# Patient Record
Sex: Female | Born: 1945
Health system: Southern US, Community
[De-identification: ages and names within clinical notes are randomized; demographics above are authoritative.]

## PROBLEM LIST (undated history)

## (undated) DIAGNOSIS — Z9221 Personal history of antineoplastic chemotherapy: Secondary | ICD-10-CM

## (undated) DIAGNOSIS — C801 Malignant (primary) neoplasm, unspecified: Secondary | ICD-10-CM

## (undated) DIAGNOSIS — Z923 Personal history of irradiation: Secondary | ICD-10-CM

## (undated) DIAGNOSIS — D649 Anemia, unspecified: Secondary | ICD-10-CM

## (undated) DIAGNOSIS — T451X5A Adverse effect of antineoplastic and immunosuppressive drugs, initial encounter: Secondary | ICD-10-CM

## (undated) DIAGNOSIS — M8580 Other specified disorders of bone density and structure, unspecified site: Secondary | ICD-10-CM

## (undated) DIAGNOSIS — Z9289 Personal history of other medical treatment: Secondary | ICD-10-CM

## (undated) DIAGNOSIS — Z8619 Personal history of other infectious and parasitic diseases: Secondary | ICD-10-CM

## (undated) DIAGNOSIS — I10 Essential (primary) hypertension: Secondary | ICD-10-CM

## (undated) DIAGNOSIS — G620 Drug-induced polyneuropathy: Secondary | ICD-10-CM

## (undated) DIAGNOSIS — E785 Hyperlipidemia, unspecified: Secondary | ICD-10-CM

## (undated) DIAGNOSIS — T7840XA Allergy, unspecified, initial encounter: Secondary | ICD-10-CM

## (undated) DIAGNOSIS — M1990 Unspecified osteoarthritis, unspecified site: Secondary | ICD-10-CM

## (undated) DIAGNOSIS — Z972 Presence of dental prosthetic device (complete) (partial): Secondary | ICD-10-CM

## (undated) HISTORY — DX: Anemia, unspecified: D64.9

## (undated) HISTORY — DX: Hyperlipidemia, unspecified: E78.5

## (undated) HISTORY — DX: Essential (primary) hypertension: I10

## (undated) HISTORY — PX: COLONOSCOPY: SHX174

## (undated) HISTORY — DX: Other specified disorders of bone density and structure, unspecified site: M85.80

## (undated) HISTORY — DX: Personal history of other infectious and parasitic diseases: Z86.19

## (undated) HISTORY — PX: TUBAL LIGATION: SHX77

## (undated) HISTORY — DX: Allergy, unspecified, initial encounter: T78.40XA

## (undated) HISTORY — DX: Unspecified osteoarthritis, unspecified site: M19.90

---

## 1998-01-23 ENCOUNTER — Ambulatory Visit (HOSPITAL_COMMUNITY): Admission: RE | Admit: 1998-01-23 | Discharge: 1998-01-23 | Payer: Self-pay | Admitting: Gynecology

## 1998-01-25 ENCOUNTER — Ambulatory Visit (HOSPITAL_COMMUNITY): Admission: RE | Admit: 1998-01-25 | Discharge: 1998-01-25 | Payer: Self-pay | Admitting: *Deleted

## 1999-03-05 ENCOUNTER — Ambulatory Visit (HOSPITAL_COMMUNITY): Admission: RE | Admit: 1999-03-05 | Discharge: 1999-03-05 | Payer: Self-pay | Admitting: Gynecology

## 2000-06-22 ENCOUNTER — Encounter: Admission: RE | Admit: 2000-06-22 | Discharge: 2000-06-22 | Payer: Self-pay | Admitting: Gynecology

## 2000-06-22 ENCOUNTER — Encounter: Payer: Self-pay | Admitting: Gynecology

## 2001-02-24 ENCOUNTER — Encounter (INDEPENDENT_AMBULATORY_CARE_PROVIDER_SITE_OTHER): Payer: Self-pay | Admitting: *Deleted

## 2001-02-24 ENCOUNTER — Other Ambulatory Visit: Admission: RE | Admit: 2001-02-24 | Discharge: 2001-02-24 | Payer: Self-pay | Admitting: Gynecology

## 2001-05-06 ENCOUNTER — Inpatient Hospital Stay (HOSPITAL_COMMUNITY): Admission: RE | Admit: 2001-05-06 | Discharge: 2001-05-08 | Payer: Self-pay | Admitting: Gynecology

## 2001-05-06 ENCOUNTER — Encounter (INDEPENDENT_AMBULATORY_CARE_PROVIDER_SITE_OTHER): Payer: Self-pay | Admitting: Specialist

## 2001-05-06 HISTORY — PX: PELVIC FLOOR REPAIR: SHX2192

## 2001-05-06 HISTORY — PX: TOTAL ABDOMINAL HYSTERECTOMY W/ BILATERAL SALPINGOOPHORECTOMY: SHX83

## 2001-06-24 ENCOUNTER — Encounter: Payer: Self-pay | Admitting: Gynecology

## 2001-06-24 ENCOUNTER — Encounter: Admission: RE | Admit: 2001-06-24 | Discharge: 2001-06-24 | Payer: Self-pay | Admitting: Gynecology

## 2002-05-19 ENCOUNTER — Other Ambulatory Visit: Admission: RE | Admit: 2002-05-19 | Discharge: 2002-05-19 | Payer: Self-pay | Admitting: Gynecology

## 2002-06-23 ENCOUNTER — Encounter: Admission: RE | Admit: 2002-06-23 | Discharge: 2002-06-23 | Payer: Self-pay | Admitting: Gynecology

## 2002-06-23 ENCOUNTER — Encounter: Payer: Self-pay | Admitting: Gynecology

## 2002-07-01 ENCOUNTER — Encounter: Admission: RE | Admit: 2002-07-01 | Discharge: 2002-07-01 | Payer: Self-pay | Admitting: Gynecology

## 2002-07-01 ENCOUNTER — Encounter: Payer: Self-pay | Admitting: Gynecology

## 2003-07-03 ENCOUNTER — Other Ambulatory Visit: Admission: RE | Admit: 2003-07-03 | Discharge: 2003-07-03 | Payer: Self-pay | Admitting: Gynecology

## 2003-07-03 ENCOUNTER — Encounter: Admission: RE | Admit: 2003-07-03 | Discharge: 2003-07-03 | Payer: Self-pay | Admitting: Gynecology

## 2003-07-03 ENCOUNTER — Encounter: Payer: Self-pay | Admitting: Gynecology

## 2003-08-31 ENCOUNTER — Observation Stay (HOSPITAL_COMMUNITY): Admission: EM | Admit: 2003-08-31 | Discharge: 2003-09-01 | Payer: Self-pay | Admitting: Emergency Medicine

## 2003-08-31 HISTORY — PX: CARDIAC CATHETERIZATION: SHX172

## 2004-07-03 ENCOUNTER — Other Ambulatory Visit: Admission: RE | Admit: 2004-07-03 | Discharge: 2004-07-03 | Payer: Self-pay | Admitting: Gynecology

## 2004-12-19 ENCOUNTER — Ambulatory Visit (HOSPITAL_COMMUNITY): Admission: RE | Admit: 2004-12-19 | Discharge: 2004-12-19 | Payer: Self-pay | Admitting: Gynecology

## 2005-07-08 ENCOUNTER — Other Ambulatory Visit: Admission: RE | Admit: 2005-07-08 | Discharge: 2005-07-08 | Payer: Self-pay | Admitting: Gynecology

## 2006-04-20 ENCOUNTER — Ambulatory Visit (HOSPITAL_COMMUNITY): Admission: RE | Admit: 2006-04-20 | Discharge: 2006-04-20 | Payer: Self-pay | Admitting: Gynecology

## 2007-01-06 ENCOUNTER — Other Ambulatory Visit: Admission: RE | Admit: 2007-01-06 | Discharge: 2007-01-06 | Payer: Self-pay | Admitting: Gynecology

## 2007-07-19 ENCOUNTER — Ambulatory Visit (HOSPITAL_COMMUNITY): Admission: RE | Admit: 2007-07-19 | Discharge: 2007-07-19 | Payer: Self-pay | Admitting: Gynecology

## 2008-08-15 ENCOUNTER — Ambulatory Visit (HOSPITAL_COMMUNITY): Admission: RE | Admit: 2008-08-15 | Discharge: 2008-08-15 | Payer: Self-pay | Admitting: Gynecology

## 2010-06-21 ENCOUNTER — Ambulatory Visit (HOSPITAL_COMMUNITY): Admission: RE | Admit: 2010-06-21 | Discharge: 2010-06-21 | Payer: Self-pay | Admitting: Gynecology

## 2011-01-31 NOTE — Op Note (Signed)
[facility]  Patient:    Ellen Beltran, Ellen Beltran Visit Number: [id number] MRN: [id number]          Service Type: GYN Location: 4W 0442 01 Attending Physician:  Katrina Stack Dictated by:   Gretta Cool, M.D. Proc. Date: 05/06/01 Adm. Date:  05/06/2001                             Operative Report  PREOPERATIVE DIAGNOSIS:  Atypical endometrial hyperplasia.  POSTOPERATIVE DIAGNOSIS:  Atypical endometrial hyperplasia.  PROCEDURE:  Total abdominal hysterectomy, bilateral salpingo-oophorectomy, peritoneal washings.  SURGEON:  Gretta Cool, M.D.  ASSISTANT:  Raynald Kemp, M.D.  ANESTHESIA:  General.  DESCRIPTION OF PROCEDURE:  Under excellent general anesthesia with the patients abdomen prepped and draped as a sterile field, a Pfannenstiel incision was made and then extended through the fascia.  The peritoneum was then opened and the abdomen explored.  Peritoneal washings were taken prior. The peritoneal surfaces were examined in all areas.  There was no significant endometriosis or evidence of peritoneal surface disease.  The node bearing areas were negative, not enlarged.  At this point [facility] clamps were placed across the adnexal structures and the uterus elevated.  The round ligaments were then transected and the peritoneum opened and a bladder pushed off the lower uterine segment.  The infundibular pelvic vessels were then clamped, cut, sutured and tied with 0 Vicryl.  The uterine vessels were then skeletonized, clamped, cut, sutured and tied with 0 Vicryl. The cardinal and uterosacral ligaments were then progressively clamped, cut, sutured and tied with 0 Vicryl.  The cervix was then excised from the vaginal cuff.  The cuff was closed with a running suture of 0 Vicryl.  At this point a uterosacral cardinal colposuspension was undertaken, so as to secure the vaginal cuff and to prevent weakness and possible subsequent vault prolapse.   The anterior vaginal wall was plicated in the area that it was thin at the junction with the cervix.  At this point the pelvic floor was well supported.  The pelvic peritoneum was then closed with running suture of 2-0 Monocryl.  The pelvis was then irrigated with lactated Ringers and the instruments and packs were removed.  The omentum was pulled down over the bowel.  The abdominal peritoneum was then closed with running suture #0 Monocryl.  The fascia was approximated with 0 Vicryl from each angle of the midline.  Subcutaneous tissue was approximated with interrupted suture with 3-0 Vicryl and the skin closed with skin staples and Steri-Strips.  At the end of the procedure, sponge and lap counts were correct.  There were no complications.  The patient returned to the recovery room in excellent condition. Dictated by:   Gretta Cool, M.D. Attending Physician:  Katrina Stack DD:  05/06/01 TD:  05/06/01 Job: [id number]

## 2011-01-31 NOTE — Consult Note (Signed)
NAME:  Ellen Beltran, Ellen Beltran                         ACCOUNT NO.:  [id number]   MEDICAL RECORD NO.:  [id number]                   PATIENT TYPE:  INP   LOCATION:  1827                                 FACILITY:  [facility]   PHYSICIAN:  Cristy Hilts. Jacinto Halim, M.D.                  DATE OF BIRTH:  1945/11/13   DATE OF CONSULTATION:  DATE OF DISCHARGE:                                   CONSULTATION   CHIEF COMPLAINT:  Chest pain since 10 a.m.   HISTORY OF PRESENT ILLNESS:  [name] is a 65 year old Caucasian  female with prior history of hyperlipidemia and a very strong family history  of premature coronary artery disease.  She was doing well until about 10  o'clock this morning when she had severe mid sternal chest pain with  radiation to her back and also to her jaws.  During this she continued to  work and went to her work place, where she had severe exacerbation of her  chest pain associated with lightheadedness.  She went to see Dr. Evelena Peat and she had a mildly abnormal EKG.  With the ongoing chest pain  she was sent to our office for further evaluation.  In the office she  continues to have severe chest pain with radiation to her jaw.  She was  given two sublingual nitroglycerins without any relief of the chest pain.   She denies any shortness of breath, denies any associated palpitations,  diaphoresis, nausea and vomiting.   REVIEW OF SYSTEMS:  She denies any paroxysmal dyspnea or orthopnea.  She  denies any shortness of breath.  She denies any neurological weaknesses.  She denies any recent weight gain or weight loss, denies any symptoms of  reflux.  Other systems are negative.   PAST MEDICAL HISTORY:  Hyperlipidemia.   PAST SURGICAL HISTORY:  Hysterectomy about 2-1/2 years ago.   FAMILY HISTORY:  Strongly positive for premature coronary artery disease.  Father died at the age of 31 with a massive myocardial infarction.  Both of  her brothers had coronary artery bypass  grafting in their 38s.   SOCIAL HISTORY:  She is married and lives with her husband.  She does not  smoke, she does not drink alcohol.  She is fairly active.  She watches her  diet.   PRESENT MEDICATIONS:  1. Aspirin 81 mg p.o. daily.  2. __________ 1 p.o. every other day.  3. Vivelle patch twice per week.   ALLERGIES:  No known drug allergies.   PHYSICAL EXAMINATION:  GENERAL:  She is petite.  She appears to be in  distress with chest pain.  VITAL SIGNS:  Heart rate of 118 beats/minute, respirations were 17, blood  pressure was 172/80 mmHg on right and 172/80 mmHg on the left arm.  HEART:  Tachycardia present.  No gallop appreciated.  No murmurs  appreciated.  No rub, no evidence  of aortic regurgitation on physical  examination.  CHEST:  There are clear breath sounds.  No crackles.  ABDOMEN:  Benign.  No organomegaly.  EXTREMITIES:  Showed no evidence of edema.  Peripheral vascular examination  was normal with all pulses to be equal and 3+.   PERTINENT FINDINGS:  ECG demonstrates small Q waves in the inferolateral  leads.  No acute changes of ischemia.   IMPRESSION:  1. Chest pain with radiation to her back and her jaws suggestive of unstable     angina.  The chest pain is ongoing and unresponsive to nitroglycerin.  2. Hyperlipidemia with the low-density lipoprotein in the 180s a year ago.  3. Strong family history for coronary artery disease as dictated above.   RECOMMENDATIONS:  The patient will be admitted to the hospital on an  emergent basis via EMS.  She will be taken to the cardiac catheterization  lab on an emergent basis.  The risks of cardiac catheterization, including  1% risk of death, stroke, heart attack, or need for urgent bypass surgery  with diagnostic catheterization and 2-3% of PCI has been explained to the  patient.  The patient and her husband, who was present at the bedside, are  willing to proceed.  Further recommendations will follow following the   cardiac catheterization.                                               Cristy Hilts. Jacinto Halim, M.D.    Pilar Plate  D:  08/31/2003  T:  08/31/2003  Job:  [id number]   cc:   Althea Charon  [address]  [place]  [id number]  Fax: [phone]

## 2011-01-31 NOTE — Cardiovascular Report (Signed)
NAME:  Ellen Beltran, Ellen Beltran                         ACCOUNT NO.:  [id number]   MEDICAL RECORD NO.:  [id number]                   PATIENT TYPE:  INP   LOCATION:  6522                                 FACILITY:  [facility]   PHYSICIAN:  Cristy Hilts. Jacinto Halim, M.D.                  DATE OF BIRTH:  [birth year]   DATE OF PROCEDURE:  DATE OF DISCHARGE:                              CARDIAC CATHETERIZATION   REFERRING PHYSICIAN:  Evelena Peat, M.D.   PROCEDURE PERFORMED:  1. Left ventriculography.  2. Selective left and right coronary arteriography.  3. Ascending aortogram.  4. Right femoral angiography with closure of the right coronary artery     access with AngioSeal.   INDICATIONS:  Ellen Beltran is a 65 year old Caucasian female with past  medical history of hyperlipidemia, a very strong family history of premature  coronary artery disease in the family which father had myocardial infarction  and died with massive MI at the age of 68, two of her brothers having had  bypass surgery in their 3s.  Was sent to our office directly from Dr.  Lucie Leather office after she had complained of severe chest pain with  radiation to her jaw.  She had abnormal EKG in the form of small Q-waves  noted in inferolateral leads.  Giving ongoing chest pain she was emergently  brought to the cardiac catheterization laboratory to evaluate her coronary  anatomy.   Ascending aortogram was performed to evaluate for aortic dissection.   HEMODYNAMIC DATA:  Left ventricular pressures were 129/7 with end-diastolic  pressure of 8 mmHg.  The aortic pressures were 106/51 with a mean of 76  mmHg.  There was no pressure gradient across the aortic valve.   LEFT VENTRICULOGRAPHY DATA:  Left ventricle:  The left ventricular systolic  function was supranormal and hyperdynamic left ventricle with ejection  fraction estimated at 70-80%.  There was no mitral regurgitation.   CORONARY ANGIOGRAPHY:  1. Right coronary artery:  The right  coronary artery is a nondominant     vessel.  It is normal.  2. Left main coronary artery:  The left main coronary artery is a large     caliber vessel.  It is normal.  3. Circumflex:  The circumflex artery is a large caliber vessel and a     dominant vessel.  It gives origin to a large obtuse marginal 1 and a     large obtuse marginal 2 and continues as obtuse marginal 3 after giving     origin to PDA branches.  It is a dominant vessel.  4. Left anterior descending artery:  The left anterior descending artery is     a large caliber vessel.  It is smooth with no luminal irregularity.  It     gives origin to moderate sized diagonal 1, small diagonal 2, a moderate     sized diagonal 3, and several small diagonals.  It wraps around the apex.  5. Left main coronary artery:  The left main coronary artery is a large     caliber vessel and is normal.   ASCENDING AORTOGRAM:  Ascending aortogram revealed presence of three aortic  valve cusps.  There is mild aortic regurgitation.  There was no evidence of  aortic dissection.   IMPRESSION:  1. Supranormal left ventricular systolic function.  Ejection fraction     estimated at 70-80%.  2. Normal coronary artery left dominant circulation.  3. Mild aortic regurgitation.  No evidence of aortic dissection.  No     evidence of aortic root dilatation.   RECOMMENDATIONS:  1. Based on her coronary anatomy, evaluation of noncardiac cause of chest     pain is indicated.  The patient will have a VQ scan done today given her     significant chest discomfort for evaluation of pulmonary embolism.  The     patient will be started on IV heparin two hours after her access has been     closed with the AngioSeal.  2. Aggressive risk factor modification given her significant family history     is indicated including treatment of her hyperlipidemia.  Further     recommendations will follow.   TECHNIQUE OF PROCEDURE:  Under usual sterile precautions using a  6-French  right femoral arterial access sheath, a 6-French multipurpose B2 catheter  was advanced to the ascending aorta with 0.035 [place] J-wire.  The catheter  was then advanced to the left ventricle.  Left ventricular pressures were  monitored.  Hand contrast into the left ventricle was performed both in LAO  and RAO projection.  The catheter was flushed with saline and pulled back  into the ascending aorta and pressure gradient across the aorta was  monitored.  Right coronary artery was selectively engaged.  Angiography was  performed.  In a similar fashion left main coronary was selectively engaged  and angiography was performed.  Then the catheter was pulled back into the  root of the aorta and ascending aortogram was performed.  Because of  inadequate visualization the catheter was exchanged for a 6-French pigtail  catheter and 40 mL of contrast at 20 mL/second was administered and thoracic  aortogram was performed in the LAO projection.  Then the catheter was pulled  out of the body in the usual fashion.  Right femoral angiography was  performed through the arterial access sheath and the access was closed with  AngioSeal with excellent hemostasis obtained and patient was then  transferred to recovery in a stable condition.  The patient tolerated  procedure well.  No complications were noted.                                               Cristy Hilts. Jacinto Halim, M.D.    Pilar Plate  D:  08/31/2003  T:  09/01/2003  Job:  [id number]   cc:   Evelena Peat, M.D.  [address]  [place]  [place] [address]  Fax: [phone]   [facility]

## 2011-01-31 NOTE — H&P (Signed)
[facility]  Patient:    Ellen Beltran, OSBURN Visit Number: [id number] MRN: [id number]          Service Type: GYN Location: 4W 0442 01 Attending Physician:  Katrina Stack Dictated by:   Gretta Cool, M.D. Proc. Date: 05/06/01 Adm. Date:  05/06/2001   CC:         Teena Irani. Arlyce Dice, M.D.   History and Physical  HISTORY OF PRESENT ILLNESS:  Ellen Beltran is a 65 year old gravida 2, para 2, under the primary care of Dr. Dara Hoyer, [place], [place].  She is on hormone replacement therapy with Premarin and Provera.  In April 2002, she experienced bleeding for the entire month, and moderate pelvic discomfort. She states she has taken the Provera on a regular basis, 14 days each cycle, but on endometrial sampling was found to have atypical endometrial hyperplasia with no evidence of adenocarcinoma.  Because of the nature of this process, she is now admitted for definitive therapy by hysterectomy and salpingo-oophorectomy.  She understands there is a possibility of finding more than atypical hyperplasia.  She is now admitted for definitive therapy as above.  She understands that there is no alternative that is satisfactory for complete relief of concern about endometrial adenocarcinoma developing without hysterectomy.  She has no pelvic support problems by history.  Has slight anterior vaginal wall weakness.  We have planned a hysterectomy, salpingo-oophorectomy, with vaginal vault suspension, uterosacral cardinal complex suspension.  PAST MEDICAL HISTORY:  Usual childhood diseases without sequelae.  MEDICAL ILLNESSES:  None.  PAST SURGICAL HISTORY:  Tubal sterilization in 1976.  HABITS:  Denies ethanol, tobacco, or recreational drug use.  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  Her children are grown and away from home.  FAMILY HISTORY:  Father died of myocardial infarction.   Mother is living at advanced age with hypothyroidism and blood pressure elevation.  Two sisters also have thyroid abnormality and blood pressure elevation.  One brother is living after myocardial infarction.  REVIEW OF SYSTEMS:  HEENT:  Denies symptoms.  CARDIORESPIRATORY:  Denies asthma, cough, bronchitis, shortness of breath.  GASTROINTESTINAL/GASTROURINARY:  Denies frequency, urgency, dysuria, change in bowel habits, food intolerance.  PHYSICAL EXAMINATION:  GENERAL:  Well-developed, well-nourished, thin white female.  HEENT:  Pupils are equal, round and reactive to light and accommodation. Fundi not examined.  Oropharynx is clear.  NECK:  Supple without mass or thyroid enlargement.  CHEST:  Clear to auscultation and percussion.  HEART:  Regular rhythm without murmur or cardiac enlargement.  BREASTS:  Soft without mass, nodes, nipple discharge.  PELVIC:  External genitalia normal female vagina, clean rugous, slight anterior vault and vaginal wall central weakness.  The cervix is parous and clean.  Uterus is normal size, shape, and contour.  Adnexa clear. Rectovaginal confirms.  IMPRESSIONS: 1. Atypical endometrial hyperplasia, complex, premalignant lesion. 2. Mild anterior fascial weakness. 3. Family history of thyroid autoimmune disease. 4. Strong family history of cardiovascular disease. 5. Family history of colon polyps.  Primary health care by Dr. Dara Hoyer, [facility]. Dictated by:   Gretta Cool, M.D. Attending Physician:  Katrina Stack DD:  05/06/01 TD:  05/06/01 Job: [id number]

## 2011-01-31 NOTE — Discharge Summary (Signed)
NAME:  Ellen Beltran, Ellen Beltran                         ACCOUNT NO.:  [id number]   MEDICAL RECORD NO.:  [id number]                   PATIENT TYPE:  INP   LOCATION:  6522                                 FACILITY:  [facility]   PHYSICIAN:  Cristy Hilts. Jacinto Halim, M.D.                  DATE OF BIRTH:  25-Feb-1946   DATE OF ADMISSION:  08/31/2003  DATE OF DISCHARGE:  09/01/2003                                 DISCHARGE SUMMARY   DISCHARGE DIAGNOSES:  1. Chest pain, resolved.  2. Status post catheterization yesterday with clear coronaries.  3. Status post VQ scan with negative result.  4. History of hyperlipidemia.  Lipid profile is pending at the time of     discharge.  5. Premature family history of coronary artery disease.  6. Possible gastroesophageal reflux disease on empiric proton pump inhibitor     therapy.  7. History of hysterectomy.   HISTORY OF PRESENT ILLNESS/HOSPITAL COURSE:  This is a 65 year old female  with a prior history of hyperlipidemia diagnosed approximately a year ago  when her LDL was in the 180s and has not been treated with Nystatin therapy  prior. The patient presented to Dr. Mellody Life office with chest pain.  In  the early morning, she experienced chest pain with radiation to her jaw,  still proceeded and went to work. While at work, she felt worse and the pain  started radiating to the back and again to the jaw line. She was feeling  dizzy. She was seen by Dr. Jacinto Halim in his office, and because of the  progressive nature of chest pain, she was sent to the emergency department  for evaluation. The patient has been assessed by Dr. Jacinto Halim and decision was  made to proceed with cardiac catheterization for definitive diagnosis.   HOSPITAL PROCEDURES:  On August 31, 2003, Dr. Jacinto Halim performed cardiac  catheterization that showed normal coronaries and a left dominant coronary  circulation with an EF of 70% to 80%, and normal mitral regurgitation. The  aorta did not show any dissection. No  dilatation of aortic root.   A VQ scan was performed and showed negative result for pulmonary embolism.   All her cardiac panels were negative x3.   Labs showed the following results: White blood cell count was elevated to  20.8, hemoglobin 13.3, hematocrit 38.7, platelet count 249,000.  Sodium 140,  potassium 4.0, chloride 101, CO2 29, BUN 14, creatinine 0.7, glucose 158.   The patient was assessed by Dr. Alanda Amass the morning of discharge and found  to be stable from a cardiovascular standpoint. Her chest pain resolved  except for some minor discomfort in the upper epigastrium. A decision was  made to discharge the patient on PPI and hold statin therapy up until the  fasting lipid profile becomes available, and based on the result of her  cholesterol status, we will make a choice of dietary modifications versus  pharmacological therapy.   DISCHARGE ACTIVITY:  No driving, no heavy lifting, or strenuous activity for  three days post catheterization.   DISCHARGE DIET:  Low-fat, low-cholesterol diet.   She is instructed to call our office should there be any problems with the  groin puncture site. Follow the appointment scheduled in our office on  September 14, 2003, at 10:45 a.m. with Dr. Jacinto Halim.      Raymon Mutton, P.A.                    Cristy Hilts. Jacinto Halim, M.D.    MK/MEDQ  D:  09/01/2003  T:  09/02/2003  Job:  [id number]   cc:   [facility]

## 2011-01-31 NOTE — Op Note (Signed)
[facility]  Patient:    Ellen Beltran, Ellen Beltran Visit Number: [id number] MRN: [id number]          Service Type: GYN Location: 4W 0442 01 Attending Physician:  Katrina Stack Dictated by:   Gretta Cool, M.D. Proc. Date: 05/06/01 Adm. Date:  05/06/2001 Disc. Date: 05/08/2001   CC:         Teena Irani. Arlyce Dice, M.D.   Operative Report  PREOPERATIVE DIAGNOSIS:  Atypical endometrial complex hyperplasia.  POSTOPERATIVE DIAGNOSIS:  Atypical endometrial complex hyperplasia.  No evidence of invasion or of adenocarcinoma.  PROCEDURES:  Total abdominal hysterectomy, bilateral salpingo-oophorectomy, cardinal uterosacral colposuspension.  SURGEON:  Gretta Cool, M.D.  ASSISTANT:  Raynald Kemp, M.D.  ANESTHESIA:  General.  DESCRIPTION OF PROCEDURE:  Under excellent general anesthesia, the patient was examined and prepped and draped as a sterile field.  A Foley catheter catheter draining her bladder.  A Pfannenstiel incision was made and the incision was extended through the fascia and the rectus muscle was separated in the midline.  The peritoneum was opened.  The abdomen was explored and there were no abnormalities identified in the upper abdomen.  The appendix was not removed.  Examination of the pelvis previous tubal sterilization procedure, but otherwise normal appearing uterus and very small ovaries.  The adnexal structures were clamped across as to elevate the uterus.  The round ligament was then transected.  The anterior leaf of the broad ligament was then opened and bladder pushed off the lower segment.  The infundibular pelvic vessels were then skeletonized, clamped, cut, sutured, and tied with 0 Vicryl.  A second free tie was used to doubly ligate the infundibular pelvic.  Uterine vessels were then skeletonized, clamp, cut, sutured, and tied with 0 Vicryl. The cardinal and uterosacral ligaments were then also clamped, cut, sutured, and  tied with 0 Vicryl.  The cervix was then excised.  The vaginal cuff was closed with a running suture of 0 Vicryl.  The bladder was advanced further and weak area of anterior fascia plicated centrally with 2-0 Vicryl.  At this point a cardinal uterosacral colposuspension was performed using 0 Ethibond so as to secure the uterosacral cardinal and ligaments to the apex of the vaginal cuff.  At this point the pelvic closure was repaired with a running suture of 2-0 Monocryl.  The retractors were then removed and the pelvis irrigated with lactated ringers.  The omentum was pulled down over the bowel.  The abdominal peritoneum was then closed with a running suture of 0 Monocryl.  The rectus muscles were plicated in the midline.  The fascia was then approximated with running suture of 0 Vicryl from each angle.  The midline and subcutaneous tissues were approximated with interrupted sutures of 3-0 Vicryl, and the skin was closed with skin staples and Steri-Strips.  At the end of the procedure sponge and lap counts were correct.  There were no complications.  The patient was returned to recovery room in excellent condition. Dictated by:   Gretta Cool, M.D. Attending Physician:  Katrina Stack DD:  05/08/01 TD:  05/10/01 Job: [id number]

## 2011-01-31 NOTE — Discharge Summary (Signed)
[facility]  Patient:    Ellen Beltran, Ellen Beltran Visit Number: [id number] MRN: [id number]          Service Type: Attending:  Gretta Cool, M.D. Dictated by:   Ellen Beltran, F.N.P. Adm. Date:  05/06/01 Disc. Date: 05/08/01   CC:         Ellen Beltran. Ellen Beltran, M.D.   Discharge Summary  HISTORY OF PRESENT ILLNESS:  Ellen Beltran is a 65 year old, G2, P2 female who presented to our office with complaints of vaginal bleeding x 1 month and moderate pelvic discomfort.  She states she has been taking Provera on a regular basis 14 days each cycle.  Endometrial sampling was done and found to have atypical endometrial hyperplasia without evidence of adenocarcinoma.  She is now admitted for definitive therapy by hysterectomy and salpingo-oophorectomy under general anesthesia.  It is noted that she reports no pelvic support problems by history.  PHYSICAL EXAMINATION:  CHEST:  Clear to A&P.  HEART:  Regular rate and rhythm without murmur, gallop or cardiac enlargement.  ABDOMEN:  Soft and scaphoid without masses or organomegaly.  PELVIC:  External genitalia within normal limits of female.  Vagina clean and rugae.  Slight anterior vault and vaginal wall central weakness.  The cervix is parous and clean.  Uterus is normal size, shape and contour.  Adnexa bilaterally clear.  Rectovaginal exam confirms.  IMPRESSION: 1. Atypical endometrial hyperplasia, complex, premalignant lesion. 2. Mild anterior fascial weakness. 3. Family history of thyroid autoimmune disease. 4. Strong family history of cardiovascular disease. 5. History of colon polyps.  PLAN:  Total abdominal hysterectomy with bilateral salpingo-oophorectomy under general anesthesia.  The risks and benefits have been discussed with the patient and she accepts these procedures.  LABORATORY DATA AND X-RAY FINDINGS:  Admission hemoglobin 12.1, hematocrit 35.7.  On postop day #1, hemoglobin was 11.8 with hematocrit  33.7.  EKG with normal sinus rhythm with a sinus arrhythmia.  HOSPITAL COURSE:  The patient underwent total abdominal hysterectomy, bilateral salpingo-oophorectomy and cardinal-uterosacral colposcopy suspension under general anesthesia.  The procedures were completed without any complications and the patient was returned to the recovery room in excellent condition.  Pathology report with uterine cervix with benign ectocervical and endocervical tissue, uterine corpus with proliferative endometrial with focal complex hyperplasia and associated mild atypia, nine intramural leiomyomatas, bilateral benign ovaries and a benign left vaginal cyst.  Her postoperative course was without complications and she was discharged on postop day #2 in excellent condition.  ACTIVITY:  No heavy lifting or straining.  No vaginal entrance.  Increase ambulation as tolerated.  SPECIAL INSTRUCTIONS:  She is to call for any fever of over 100.5 or failure in daily improvement.  DIET:  Regular.  DISCHARGE MEDICATIONS: 1. Tylox one p.o. q.2h. p.r.n. pain. 2. Vioxx 25 mg daily.  FOLLOWUP:  She is to return to the office in one week for followup.  CONDITION ON DISCHARGE:  Excellent.  DISCHARGE DIAGNOSIS:  Atypical endometrial hyperplasia.  PROCEDURES:  Total abdominal hysterectomy with bilateral salpingo-oophorectomy and peritoneal washings under general anesthesia. Dictated by:   Ellen Beltran, F.N.P. Attending:  Gretta Cool, M.D. DD:  05/31/01 TD:  05/31/01 Job: [id number]

## 2011-06-19 ENCOUNTER — Other Ambulatory Visit (HOSPITAL_COMMUNITY): Payer: Self-pay | Admitting: Family Medicine

## 2011-06-19 DIAGNOSIS — Z1231 Encounter for screening mammogram for malignant neoplasm of breast: Secondary | ICD-10-CM

## 2011-07-02 ENCOUNTER — Ambulatory Visit (HOSPITAL_COMMUNITY): Payer: Self-pay

## 2011-07-16 ENCOUNTER — Ambulatory Visit (HOSPITAL_COMMUNITY)
Admission: RE | Admit: 2011-07-16 | Discharge: 2011-07-16 | Disposition: A | Discharge status: Home / Self Care | Payer: Private Health Insurance - Indemnity | Source: Ambulatory Visit | Attending: Family Medicine | Admitting: Family Medicine

## 2011-07-16 DIAGNOSIS — Z1231 Encounter for screening mammogram for malignant neoplasm of breast: Secondary | ICD-10-CM | POA: Insufficient documentation

## 2012-05-03 ENCOUNTER — Encounter: Payer: Self-pay | Admitting: Internal Medicine

## 2012-06-08 ENCOUNTER — Encounter: Payer: Self-pay | Admitting: Internal Medicine

## 2012-06-08 ENCOUNTER — Ambulatory Visit (AMBULATORY_SURGERY_CENTER): Payer: Private Health Insurance - Indemnity

## 2012-06-08 VITALS — Ht <= 58 in | Wt 105.7 lb

## 2012-06-08 DIAGNOSIS — Z8371 Family history of colonic polyps: Secondary | ICD-10-CM

## 2012-06-08 DIAGNOSIS — Z1211 Encounter for screening for malignant neoplasm of colon: Secondary | ICD-10-CM

## 2012-06-08 MED ORDER — MOVIPREP 100 G PO SOLR: ORAL | Status: DC

## 2012-06-22 ENCOUNTER — Encounter: Payer: Self-pay | Admitting: Internal Medicine

## 2012-06-22 ENCOUNTER — Ambulatory Visit (AMBULATORY_SURGERY_CENTER): Payer: Private Health Insurance - Indemnity | Admitting: Internal Medicine

## 2012-06-22 VITALS — BP 105/56 | HR 55 | Temp 96.2°F | Resp 16 | Ht <= 58 in | Wt 105.0 lb

## 2012-06-22 DIAGNOSIS — Z8371 Family history of colonic polyps: Secondary | ICD-10-CM

## 2012-06-22 DIAGNOSIS — Z1211 Encounter for screening for malignant neoplasm of colon: Secondary | ICD-10-CM

## 2012-06-22 HISTORY — PX: COLONOSCOPY WITH PROPOFOL: SHX5780

## 2012-06-22 MED ORDER — SODIUM CHLORIDE 0.9 % IV SOLN: 500.0000 mL | INTRAVENOUS | Status: DC

## 2012-06-22 NOTE — Op Note (Signed)
[facility] [address] [place], [address]   COLONOSCOPY PROCEDURE REPORT  PATIENT: Ellen, Beltran  MR#: [id number] BIRTHDATE: December 10, 1945 , 65  yrs. old GENDER: Female ENDOSCOPIST: Roxy Cedar, MD REFERRED XB:[id number] / Recall PROCEDURE DATE:  06/22/2012 PROCEDURE:   Colonoscopy, screening ASA CLASS:   Class II INDICATIONS:average risk screening; Normal colonoscopy 09-2001. MEDICATIONS: MAC sedation, administered by CRNA and propofol (Diprivan) 300mg  IV  DESCRIPTION OF PROCEDURE:   After the risks benefits and alternatives of the procedure were thoroughly explained, informed consent was obtained.  A digital rectal exam revealed no abnormalities of the rectum.   The LB CF-[id number]  endoscope was introduced through the anus and advanced to the cecum, which was identified by both the appendix and ileocecal valve. No adverse events experienced.   The quality of the prep was excellent, using MoviPrep  The instrument was then slowly withdrawn as the colon was fully examined.      COLON FINDINGS: A normal appearing cecum, ileocecal valve, and appendiceal orifice were identified.  The ascending, hepatic flexure, transverse, splenic flexure, descending, sigmoid colon and rectum appeared unremarkable.  No polyps or cancers were seen. Retroflexed views revealed no abnormalities. The time to cecum=3 minutes 33 seconds.  Withdrawal time=9 minutes 52 seconds.  The scope was withdrawn and the procedure completed. COMPLICATIONS: There were no complications.  ENDOSCOPIC IMPRESSION: 1. Normal colon  RECOMMENDATIONS: 1. Continue current colorectal screening recommendations for "routine risk" patients with a repeat colonoscopy in 10 years.   eSigned:  Roxy Cedar, MD 06/22/2012 10:33 AM   cc: Benedetto Goad, MD and The Patient

## 2012-06-22 NOTE — Patient Instructions (Addendum)
YOU HAD AN ENDOSCOPIC PROCEDURE TODAY AT THE [facility]: Refer to the procedure report that was given to you for any specific questions about what was found during the examination.  If the procedure report does not answer your questions, please call your gastroenterologist to clarify.  If you requested that your care partner not be given the details of your procedure findings, then the procedure report has been included in a sealed envelope for you to review at your convenience later.  YOU SHOULD EXPECT: Some feelings of bloating in the abdomen. Passage of more gas than usual.  Walking can help get rid of the air that was put into your GI tract during the procedure and reduce the bloating. If you had a lower endoscopy (such as a colonoscopy or flexible sigmoidoscopy) you may notice spotting of blood in your stool or on the toilet paper. If you underwent a bowel prep for your procedure, then you may not have a normal bowel movement for a few days.  DIET: Your first meal following the procedure should be a light meal and then it is ok to progress to your normal diet.  A half-sandwich or bowl of soup is an example of a good first meal.  Heavy or fried foods are harder to digest and may make you feel nauseous or bloated.  Likewise meals heavy in dairy and vegetables can cause extra gas to form and this can also increase the bloating.  Drink plenty of fluids but you should avoid alcoholic beverages for 24 hours.  ACTIVITY: Your care partner should take you home directly after the procedure.  You should plan to take it easy, moving slowly for the rest of the day.  You can resume normal activity the day after the procedure however you should NOT DRIVE or use heavy machinery for 24 hours (because of the sedation medicines used during the test).    SYMPTOMS TO REPORT IMMEDIATELY: A gastroenterologist can be reached at any hour.  During normal business hours, 8:30 AM to 5:00 PM Monday through Friday,  call [phone].  After hours and on weekends, please call the GI answering service at [phone] who will take a message and have the physician on call contact you.   Following lower endoscopy (colonoscopy or flexible sigmoidoscopy):  Excessive amounts of blood in the stool  Significant tenderness or worsening of abdominal pains  Swelling of the abdomen that is new, acute  Fever of 100F or higher  FOLLOW UP: If any biopsies were taken you will be contacted by phone or by letter within the next 1-3 weeks.  Call your gastroenterologist if you have not heard about the biopsies in 3 weeks.  Our staff will call the home number listed on your records the next business day following your procedure to check on you and address any questions or concerns that you may have at that time regarding the information given to you following your procedure. This is a courtesy call and so if there is no answer at the home number and we have not heard from you through the emergency physician on call, we will assume that you have returned to your regular daily activities without incident.  SIGNATURES/CONFIDENTIALITY: You and/or your care partner have signed paperwork which will be entered into your electronic medical record.  These signatures attest to the fact that that the information above on your After Visit Summary has been reviewed and is understood.  Full responsibility of the confidentiality of this   discharge information lies with you and/or your care-partner.   Thank-you for choosing us for your healthcare needs. 

## 2012-06-22 NOTE — Progress Notes (Addendum)
Patient did not have preoperative order for IV antibiotic SSI prophylaxis. (G8918)  Patient did not experience any of the following events: a burn prior to discharge; a fall within the facility; wrong site/side/patient/procedure/implant event; or a hospital transfer or hospital admission upon discharge from the facility. (G8907)  

## 2012-06-23 ENCOUNTER — Telehealth: Payer: Self-pay

## 2012-06-23 NOTE — Telephone Encounter (Signed)
Left message on answering machine. 

## 2013-03-21 ENCOUNTER — Other Ambulatory Visit: Payer: Self-pay | Admitting: Obstetrics & Gynecology

## 2013-03-21 DIAGNOSIS — R928 Other abnormal and inconclusive findings on diagnostic imaging of breast: Secondary | ICD-10-CM

## 2013-03-30 ENCOUNTER — Ambulatory Visit
Admission: RE | Admit: 2013-03-30 | Discharge: 2013-03-30 | Disposition: A | Discharge status: Home / Self Care | Payer: Medicare Other | Source: Ambulatory Visit | Attending: Obstetrics & Gynecology | Admitting: Obstetrics & Gynecology

## 2013-03-30 ENCOUNTER — Other Ambulatory Visit: Payer: Self-pay | Admitting: Obstetrics & Gynecology

## 2013-03-30 DIAGNOSIS — R928 Other abnormal and inconclusive findings on diagnostic imaging of breast: Secondary | ICD-10-CM

## 2013-04-15 ENCOUNTER — Other Ambulatory Visit: Payer: Self-pay | Admitting: Obstetrics & Gynecology

## 2013-04-15 ENCOUNTER — Ambulatory Visit
Admission: RE | Admit: 2013-04-15 | Discharge: 2013-04-15 | Disposition: A | Discharge status: Home / Self Care | Payer: Medicare Other | Source: Ambulatory Visit | Attending: Obstetrics & Gynecology | Admitting: Obstetrics & Gynecology

## 2013-04-15 DIAGNOSIS — R928 Other abnormal and inconclusive findings on diagnostic imaging of breast: Secondary | ICD-10-CM

## 2013-04-18 ENCOUNTER — Other Ambulatory Visit: Payer: Self-pay | Admitting: Obstetrics & Gynecology

## 2013-04-18 DIAGNOSIS — C50912 Malignant neoplasm of unspecified site of left female breast: Secondary | ICD-10-CM

## 2013-04-20 ENCOUNTER — Telehealth: Payer: Self-pay | Admitting: *Deleted

## 2013-04-20 DIAGNOSIS — Z853 Personal history of malignant neoplasm of breast: Secondary | ICD-10-CM | POA: Insufficient documentation

## 2013-04-20 DIAGNOSIS — C50112 Malignant neoplasm of central portion of left female breast: Secondary | ICD-10-CM

## 2013-04-20 NOTE — Telephone Encounter (Signed)
Received call from Merrick, patient's husband and confirmed [facility] appt. For 04/27/13 at 0800am.  Instructions and contact information given.

## 2013-04-21 ENCOUNTER — Telehealth: Payer: Self-pay

## 2013-04-22 ENCOUNTER — Other Ambulatory Visit: Payer: Medicare Other

## 2013-04-25 ENCOUNTER — Ambulatory Visit
Admission: RE | Admit: 2013-04-25 | Discharge: 2013-04-25 | Disposition: A | Discharge status: Home / Self Care | Payer: Medicare Other | Source: Ambulatory Visit | Attending: Obstetrics & Gynecology | Admitting: Obstetrics & Gynecology

## 2013-04-25 DIAGNOSIS — C50912 Malignant neoplasm of unspecified site of left female breast: Secondary | ICD-10-CM

## 2013-04-25 MED ORDER — GADOBENATE DIMEGLUMINE 529 MG/ML IV SOLN
9.0000 mL | Freq: Once | INTRAVENOUS | Status: AC | PRN
  Administered 2013-04-25: 9 mL via INTRAVENOUS

## 2013-04-27 ENCOUNTER — Encounter: Payer: Self-pay | Admitting: Oncology

## 2013-04-27 ENCOUNTER — Ambulatory Visit (HOSPITAL_BASED_OUTPATIENT_CLINIC_OR_DEPARTMENT_OTHER): Payer: Medicare Other | Admitting: Surgery

## 2013-04-27 ENCOUNTER — Encounter: Payer: Self-pay | Admitting: *Deleted

## 2013-04-27 ENCOUNTER — Other Ambulatory Visit (HOSPITAL_BASED_OUTPATIENT_CLINIC_OR_DEPARTMENT_OTHER): Payer: Medicare Other | Admitting: Lab

## 2013-04-27 ENCOUNTER — Ambulatory Visit (HOSPITAL_BASED_OUTPATIENT_CLINIC_OR_DEPARTMENT_OTHER): Payer: Medicare Other | Admitting: Oncology

## 2013-04-27 ENCOUNTER — Ambulatory Visit: Payer: Medicare Other

## 2013-04-27 ENCOUNTER — Ambulatory Visit
Admission: RE | Admit: 2013-04-27 | Discharge: 2013-04-27 | Disposition: A | Discharge status: Home / Self Care | Payer: Medicare Other | Source: Ambulatory Visit | Attending: Radiation Oncology | Admitting: Radiation Oncology

## 2013-04-27 VITALS — BP 150/74 | HR 60 | Temp 97.6°F | Resp 18 | Ht <= 58 in | Wt 109.2 lb

## 2013-04-27 DIAGNOSIS — C50119 Malignant neoplasm of central portion of unspecified female breast: Secondary | ICD-10-CM

## 2013-04-27 DIAGNOSIS — C50912 Malignant neoplasm of unspecified site of left female breast: Secondary | ICD-10-CM

## 2013-04-27 DIAGNOSIS — C50919 Malignant neoplasm of unspecified site of unspecified female breast: Secondary | ICD-10-CM

## 2013-04-27 DIAGNOSIS — Z171 Estrogen receptor negative status [ER-]: Secondary | ICD-10-CM

## 2013-04-27 DIAGNOSIS — C50112 Malignant neoplasm of central portion of left female breast: Secondary | ICD-10-CM

## 2013-04-27 LAB — CBC WITH DIFFERENTIAL/PLATELET
Basophils Absolute: 0.1 10*3/uL (ref 0.0–0.1)
Eosinophils Absolute: 0.1 10*3/uL (ref 0.0–0.5)
HCT: 36.5 % (ref 34.8–46.6)
HGB: 12.4 g/dL (ref 11.6–15.9)
MCV: 88.3 fL (ref 79.5–101.0)
MONO%: 8.5 % (ref 0.0–14.0)
NEUT#: 5.7 10*3/uL (ref 1.5–6.5)
NEUT%: 60.2 % (ref 38.4–76.8)
RDW: 13.2 % (ref 11.2–14.5)
lymph#: 2.7 10*3/uL (ref 0.9–3.3)

## 2013-04-27 LAB — COMPREHENSIVE METABOLIC PANEL (CC13)
Albumin: 3.9 g/dL (ref 3.5–5.0)
BUN: 17.1 mg/dL (ref 7.0–26.0)
Calcium: 10.2 mg/dL (ref 8.4–10.4)
Chloride: 105 mEq/L (ref 98–109)
Glucose: 94 mg/dl (ref 70–140)
Potassium: 4.3 mEq/L (ref 3.5–5.1)

## 2013-04-27 MED ORDER — PROCHLORPERAZINE MALEATE 10 MG PO TABS: 10.0000 mg | ORAL_TABLET | Freq: Four times a day (QID) | ORAL | Status: DC | PRN

## 2013-04-27 MED ORDER — LIDOCAINE-PRILOCAINE 2.5-2.5 % EX CREA: TOPICAL_CREAM | CUTANEOUS | Status: DC | PRN

## 2013-04-27 MED ORDER — DEXAMETHASONE 4 MG PO TABS: ORAL_TABLET | ORAL | Status: DC

## 2013-04-27 MED ORDER — ONDANSETRON HCL 8 MG PO TABS: 8.0000 mg | ORAL_TABLET | Freq: Two times a day (BID) | ORAL | Status: DC | PRN

## 2013-04-27 MED ORDER — LORAZEPAM 0.5 MG PO TABS: 0.5000 mg | ORAL_TABLET | Freq: Four times a day (QID) | ORAL | Status: DC | PRN

## 2013-04-27 NOTE — Progress Notes (Signed)
Ellen Beltran [id number] [birth year] 67 y.o. 04/27/2013 12:30 PM  CC  [name], MD [address] [place] [address] Dr. Lonie Peak Dr. Emelia Loron  REASON FOR CONSULTATION:  68 year old female with new diagnosis of T2 NX MX invasive ductal carcinoma of the left breast. Patient is seen in the multidisciplinary breast clinic for discussion of treatment options.  STAGE:   Cancer of central portion of female breast   Primary site: Breast (Left)   Staging method: AJCC 7th Edition   Clinical: Stage IIA (T2, N0, cM0)   Summary: Stage IIA (T2, N0, cM0)  REFERRING PHYSICIAN: Dr. Emelia Loron  HISTORY OF PRESENT ILLNESS:  Ellen Beltran is a 67 y.o. female.  Past medical history significant for hypertension hyperlipidemia and osteoporosis as well as osteoarthritis. Patient went for her annual screening mammogram in July 2014. She was noted to have a look mass at the 12:00 position. She had diagnostic mammogram and ultrasound performed of the left breast. The ultrasound showed a 1.8 cm mass in the 12:00 position of the left breast. MRI was also performed that showed a 2.4 cm area of concern in the 12:00 position of the left breast. There were no abnormal appearing axillary subpectoral or internal mammary lymph nodes. Patient's biopsy performed on 04/15/2013 revealed invasive carcinoma with papillary features consistent with a ductal phenotype. Tumor grade was immediate to high grade. It was ER negative PR negative with a proliferation marker Ki-67 47%. HER-2/neu by cish showed no amplification. Patient's case was discussed at the multidisciplinary breast conference and she is now seen in the [facility] for discussion of treatment options. She is also seen by Dr. Emelia Loron and Dr. Lonie Peak. She is without complaints except for tenderness at the biopsy site.   Past Medical History: Past Medical History  Diagnosis Date  . Hypertension   . Hyperlipidemia   .  Osteopenia   . GERD (gastroesophageal reflux disease)   . Arthritis   . Hot flashes     Past Surgical History: Past Surgical History  Procedure Laterality Date  . Total abdominal hysterectomy    . Tubal ligation    . Cardiac catheterization  2004    Family History: Family History  Problem Relation Age of Onset  . Heart disease Father   . Heart disease Brother   . Lung cancer Mother     Social History History  Substance Use Topics  . Smoking status: Never Smoker   . Smokeless tobacco: Never Used  . Alcohol Use: No    Allergies: Allergies  Allergen Reactions  . Codeine Other (See Comments)    Sees unusual things  . Morphine And Related Nausea Only  . Sulfa Antibiotics Rash    Current Medications: Current Outpatient Prescriptions  Medication Sig Dispense Refill  . Cholecalciferol (VITAMIN D-3) 5000 UNITS TABS Vit d-3 softgels (5,000 iu)-Take one daily      . dexamethasone (DECADRON) 4 MG tablet Take 2 tablets by mouth once a day on the day after chemotherapy and then take 2 tablets two times a day for 2 days. Take with food.  30 tablet  1  . estradiol (VIVELLE-DOT) 0.0375 MG/24HR Place 1 patch onto the skin 2 (two) times a week.      . lidocaine-prilocaine (EMLA) cream Apply topically as needed.  30 g  6  . lisinopril (PRINIVIL,ZESTRIL) 10 MG tablet Take 10 mg by mouth daily.      Marland Kitchen LORazepam (ATIVAN) 0.5 MG tablet Take 1 tablet (  0.5 mg total) by mouth every 6 (six) hours as needed (Nausea or vomiting).  30 tablet  0  . ondansetron (ZOFRAN) 8 MG tablet Take 1 tablet (8 mg total) by mouth 2 (two) times daily as needed. Take two times a day as needed for nausea or vomiting starting on the third day after chemotherapy.  30 tablet  1  . prochlorperazine (COMPAZINE) 10 MG tablet Take 1 tablet (10 mg total) by mouth every 6 (six) hours as needed (Nausea or vomiting).  30 tablet  1  . simvastatin (ZOCOR) 40 MG tablet Take 40 mg by mouth every evening.       No current  facility-administered medications for this visit.    OB/GYN History:menarche at age 67 she underwent menopause in August 2002. She had been on hormone replacement therapy for 12 years she quit August 2014. Patient's had to live births first live birth was at 68.  Fertility Discussion: not applicable Prior History of Cancer: no prior history  Health Maintenance:  Colonoscopy yes Bone Density yes Last PAP smear 2014  ECOG PERFORMANCE STATUS: 0 - Asymptomatic  Genetic Counseling/testing: no  REVIEW OF SYSTEMS:  Comprehensive of systems was obtained and it is again separately into the electronic medical record.  PHYSICAL EXAMINATION: Blood pressure 150/74, pulse 60, temperature 97.6 F (36.4 C), temperature source Oral, resp. rate 18, height 4\' 10"  (1.473 m), weight 109 lb 3.2 oz (49.533 kg), SpO2 100.00%. Patient is well-developed well-nourished female in no acute distress HEENT exam unremarkable Lungs clear Cardiovascular regular rate rhythm Abdomen is soft nontender no HSM Extremities no edema Neuro grossly nonfocal Breast exam right breast no masses nipple discharge, left breast reveals a palpable mass at the 12:00 position areas of ecchymosis is noted.    STUDIES/RESULTS: US Breast Left  04/21/13   *RADIOLOGY REPORT*  Clinical Data:  Called back from screening mammogram for possible mass left breast  DIGITAL DIAGNOSTIC LEFT MAMMOGRAM 21-Apr-2013 AND LEFT BREAST ULTRASOUND:  Comparison:  March 15, 2013, July 16, 2011, June 21, 2010  Findings:  ACR Breast Density Category b: scattered fibro glandular tissue  Spot compression CC and MLO views of the right breast are submitted.  Previously questioned mass in the left breast 12 o'clock persist.  Ultrasound is performed, showing irregular area of hypo echogenicity at the left breast 12 o'clock 1 cm from the nipple measuring 1.8 x 0.6 x 1.9 cm correlating to the mammographic finding.  IMPRESSION: Suspicious findings.   RECOMMENDATION: Ultrasound guided core biopsy left breast  I have discussed the findings and recommendations with the patient. Results were also provided in writing at the conclusion of the visit.  If applicable, a reminder letter will be sent to the patient regarding the next appointment.  BI-RADS CATEGORY 4:  Suspicious abnormality - biopsy should be considered.   Original Report Authenticated By: Sherian Rein, M.D.   Mr Breast Bilateral W Wo Contrast  04/25/2013   *RADIOLOGY REPORT*  Clinical Data:  New diagnosis of invasive carcinoma with papillary features.  MRI BILATERAL BREASTS WITHOUT AND WITH CONTRAST  Technique: Multiplanar, multisequence MR images of the bilateral breasts were obtained prior to and following the intravenous administration of 9ml of Multihance.  Labs: BUN 15, creatinine 0.9, GFR 63  Comparison:  Recent imaging examinations.  FINDINGS:  Breast composition:  c:  Heterogeneous fibroglandular tissue  Background parenchymal enhancement:  Moderate  Right breast:  No mass or abnormal enhancement.  Left breast: An irregular mass is identified 71  o'clock in the left breast, measuring 2.4 cm AP x 1.3 cm TR x 2.4 cm CC. This correlates with the biopsy proven invasive carcinoma.  Lymph nodes:  No abnormal appearing axillary, subpectoral, or internal mammary lymph nodes. In the lower chest anteriorly, there is a nonspecific 0.6 cm right intercostal lymph node.  Ancillary findings:  Tiny right pleural effusion.  IMPRESSION: 1. A 2.4 x 1.3 x 2.4 cm mass at 12 o'clock in the left breast, correlating with the biopsy proven invasive carcinoma.  No other areas of abnormal enhancement in either breast. 2. Nonspecific 0.6 cm right intercostal lymph node.  RECOMMENDATION: Treatment planning.  BI-RADS CATEGORY 6:  Known biopsy-proven malignancy - appropriate action should be taken.  THREE-DIMENSIONAL MR IMAGE RENDERING ON INDEPENDENT WORKSTATION:  Three-dimensional MR images were rendered by post-processing  of the original MR data on a DynaCad workstation.  The three-dimensional MR images were interpreted, and findings were reported in the accompanying complete MRI report for this study.   Original Report Authenticated By: Jerene Dilling, M.D.   Mm Digital Diagnostic Unilat L  04/18/2013   **ADDENDUM** CREATED: 04/18/2013 17:01:04  The final pathological diagnosis is invasive carcinoma with papillary features.  This is concordant with the imaging findings.  The final pathological diagnosis was discussed with the patient by telephone on 04/18/2013.  Her questions were answered.  She reported some pain and itchiness at the biopsy site with no bruising or palpable hematoma.  She will be seen in the Multidisciplinary Clinic on 04/27/2013.  Addended by:  Londell Moh. Azucena Kuba, M.D. on 04/18/2013 17:01:04.  **END ADDENDUM** SIGNED BY: Londell Moh. Azucena Kuba, M.D.  04/15/2013   *RADIOLOGY REPORT*  Clinical Data:  Titanium marker placement following ultrasound guided core needle biopsy earlier today.  DIGITAL DIAGNOSTIC LEFT MAMMOGRAM  Comparison:  Previous examinations.  Findings:  Digital mammographic images were performed following ultrasound guided biopsy of a 1.8 cm 12 o'clock left breast mass. These demonstrate a T-shaped biopsy marker clip at the expected position of the biopsied mass.  IMPRESSION: Appropriate clip deployment following left breast ultrasound guided core needle biopsy.   Original Report Authenticated By: Beckie Salts, M.D.   Mm Digital Diagnostic Unilat L  03/30/2013   *RADIOLOGY REPORT*  Clinical Data:  Called back from screening mammogram for possible mass left breast  DIGITAL DIAGNOSTIC LEFT MAMMOGRAM March 30, 2013 AND LEFT BREAST ULTRASOUND:  Comparison:  March 15, 2013, July 16, 2011, June 21, 2010  Findings:  ACR Breast Density Category b: scattered fibro glandular tissue  Spot compression CC and MLO views of the right breast are submitted.  Previously questioned mass in the left breast 12 o'clock persist.   Ultrasound is performed, showing irregular area of hypo echogenicity at the left breast 12 o'clock 1 cm from the nipple measuring 1.8 x 0.6 x 1.9 cm correlating to the mammographic finding.  IMPRESSION: Suspicious findings.  RECOMMENDATION: Ultrasound guided core biopsy left breast  I have discussed the findings and recommendations with the patient. Results were also provided in writing at the conclusion of the visit.  If applicable, a reminder letter will be sent to the patient regarding the next appointment.  BI-RADS CATEGORY 4:  Suspicious abnormality - biopsy should be considered.   Original Report Authenticated By: Sherian Rein, M.D.   [place] Lt Breast Bx W Loc Dev 1st Lesion Img Bx Spec US Guide  04/15/2013   **ADDENDUM** CREATED: 04/15/2013 17:35:52  Preliminary physical examination and ultrasound of the left axilla demonstrated no abnormal lymph nodes.  **  END ADDENDUM** SIGNED BY: Londell Moh. Azucena Kuba, M.D.  04/15/2013   *RADIOLOGY REPORT*  Clinical Data:  1.8 cm mass in the 12 o'clock position of the left breast with imaging features suspicious for malignancy.  ULTRASOUND GUIDED VACUUM ASSISTED CORE BIOPSY OF THE LEFT BREAST  I met with the patient, and we discussed the procedure of ultrasound-guided biopsy, including risks.  Specifically, we discussed the risks of bleeding, infection and clip migration.  We discussed the high likelihood of a successful procedure.  Informed, written consent was given.  Using sterile technique, local anesthesia, ultrasound guidance, and a 12 gauge vacuum assisted needle, biopsy was performed of the recently demonstrated 1.8 cm mass in the 12 o'clock position of the left breast.  An inferolateral approach was used.  At the conclusion of the procedure, a tissue marker clip was deployed into the biopsy cavity.  A follow-up 2-view mammogram was performed and dictated separately.  IMPRESSION: Ultrasound-guided biopsy of 1.8 cm mass in the 12 o'clock position of the left breast.  No  apparent complications.   Original Report Authenticated By: Beckie Salts, M.D.     LABS:    Chemistry      Component Value Date/Time   NA 141 04/27/2013 0816   K 4.3 04/27/2013 0816   CO2 25 04/27/2013 0816   BUN 17.1 04/27/2013 0816   CREATININE 0.9 04/27/2013 0816      Component Value Date/Time   CALCIUM 10.2 04/27/2013 0816   ALKPHOS 66 04/27/2013 0816   AST 15 04/27/2013 0816   ALT 13 04/27/2013 0816   BILITOT 0.31 04/27/2013 0816      Lab Results  Component Value Date   WBC 9.5 04/27/2013   HGB 12.4 04/27/2013   HCT 36.5 04/27/2013   MCV 88.3 04/27/2013   PLT 367 04/27/2013   PATHOLOGY: ADDITIONAL INFORMATION: PROGNOSTIC INDICATORS - ACIS Results: IMMUNOHISTOCHEMICAL AND MORPHOMETRIC ANALYSIS BY THE AUTOMATED CELLULAR IMAGING SYSTEM (ACIS) Estrogen Receptor: 0%, NEGATIVE Progesterone Receptor: 0%, NEGATIVE Proliferation Marker Ki67: 47% COMMENT: The negative hormone receptor study(ies) in this case have an internal positive control. REFERENCE RANGE ESTROGEN RECEPTOR NEGATIVE <1% POSITIVE =>1% PROGESTERONE RECEPTOR NEGATIVE <1% POSITIVE =>1% All controls stained appropriately Jimmy Picket MD Pathologist, Electronic Signature ( Signed 04/22/2013) CHROMOGENIC IN-SITU HYBRIDIZATION Results: HER-2/NEU BY CISH - NO AMPLIFICATION OF HER-2 DETECTED. RESULT RATIO OF HER2: CEP 17 SIGNALS 1.04 1 of 3 Duplicate copy FINAL for SYBOL, MORRE [phone]) ADDITIONAL INFORMATION:(continued) AVERAGE HER2 COPY NUMBER PER CELL 2.54 REFERENCE RANGE NEGATIVE HER2/Chr17 Ratio <2.0 and Average HER2 copy number <4.0 EQUIVOCAL HER2/Chr17 Ratio <2.0 and Average HER2 copy number 4.0 and <6.0 POSITIVE HER2/Chr17 Ratio >=2.0 and/or Average HER2 copy number >=6.0 Jimmy Picket MD Pathologist, Electronic Signature ( Signed 04/22/2013) FINAL DIAGNOSIS Diagnosis Breast, left, needle core biopsy, mass, 12 o'clock - INVASIVE CARCINOMA WITH PAPILLARY FEATURES. - SEE COMMENT. Microscopic  Comment The carcinoma appears grade II-III. Smooth muscle myosin, calponin, and p63 stains highlight the lack of myoepithelial cells around the carcinoma. A breast prognostic profile will be performed and the results reported separately. The results were called to [facility] on 04/18/2013. Pecola Leisure MD Pathologist, Electronic Signature (Case signed 04/18/2013) Specimen Gross and Clinical Information   ASSESSMENT    67 year old female with  #1 new diagnosis on a screening mammogram of the left breast mass at the 12:00 position. By ultrasound measured 1.8 cm by MRI measuring 2.4 cm. Biopsy of the 12:00 mass revealed an invasive ductal carcinoma intermediate to high-grade. Prognostic  markers revealed estrogen receptor negative progesterone receptor negative HER-2/neu-negative with elevated Ki-67 of 47%. Patient has a triple-negative disease. Clinical stage II ( T2 NX MX).  #2 patient was seen in the multidisciplinary breast clinic for discussion of treatment options. Due to the size of the tumor it is recommended that she proceed with neoadjuvant chemotherapy followed by surgery followed by radiation therapy. She and I discussed the rationale of therapy. We also discussed her radiology as well as pathology in detail.  #3 we discussed the kind of chemotherapy which would include Adriamycin Cytoxan given dose dense x4 cycles followed by Taxol and carboplatinum given weekly for 12 weeks. We discussed the side effects of treatment. As well as rationale.  Clinical Trial Eligibility: no Multidisciplinary conference discussion yes     PLAN:    #1 visual proceed with neoadjuvant chemotherapy with Adriamycin Cytoxan given dose dense, this would be followed by weekly Taxol carboplatinum for 12 weeks.  #2 patient will need echocardiogram, chemotherapy teaching class, Port-A-Cath placement.  #3 we discussed use of anti-emetics and prescriptions were sent to her pharmacy.  #4  hope is to begin her on treatment in the next 2 weeks time      Discussion: Patient is being treated per NCCN breast cancer care guidelines appropriate for stage.II   Thank you so much for allowing me to participate in the care of Ellen Beltran. I will continue to follow up the patient with you and assist in her care.  All questions were answered. The patient knows to call the clinic with any problems, questions or concerns. We can certainly see the patient much sooner if necessary.  I spent 40 minutes counseling the patient face to face. The total time spent in the appointment was 40 minutes.  Drue Second, MD Medical/Oncology [facility] [phone] (beeper) [phone] (Office)  04/27/2013, 12:31 PM

## 2013-04-27 NOTE — Progress Notes (Signed)
Radiation Oncology         [phone]) [phone] ________________________________  Initial outpatient Consultation - Date: 04/27/2013   Name: Ellen Beltran MRN: [id number]   DOB: [birth year]  REFERRING PHYSICIAN: Kandis Cocking, MD  DIAGNOSIS: T2N0 triple negative left breast cancer  HISTORY OF PRESENT ILLNESS::Ellen Beltran is a 67 y.o. female  underwent a screening mammogram. A mass was noted in the left breast. She was asymptomatic. Ultrasound was performed which showed a 1.8 x 0.6 x 1.9 cm mass. MRI confirmed a 2.4 x 1.3 x 2.4 cm mass. Biopsy showed a grade 2-3 invasive ductal carcinoma with papillary features. This was triple negative with a Ki-67 of 47%. She has some tenderness after her biopsy but otherwise is doing well. She has been on estrogen for 12 years and is currently taking it. She is trying to wean yourself off but is having significant hot flashes. She had a hysterectomy and salpingo-oophorectomy in 2002. She is GX P2 with first menses at 11 and menopause after her surgery in 2002. He is retired and accompanied by her husband today.  PREVIOUS RADIATION THERAPY: No  PAST MEDICAL HISTORY:  has a past medical history of Hypertension; Hyperlipidemia; Osteopenia; GERD (gastroesophageal reflux disease); Arthritis; and Hot flashes.    PAST SURGICAL HISTORY: Past Surgical History  Procedure Laterality Date  . Total abdominal hysterectomy    . Tubal ligation    . Cardiac catheterization  2004    FAMILY HISTORY:  Family History  Problem Relation Age of Onset  . Heart disease Father   . Heart disease Brother   . Lung cancer Mother     SOCIAL HISTORY:  History  Substance Use Topics  . Smoking status: Never Smoker   . Smokeless tobacco: Never Used  . Alcohol Use: No    ALLERGIES: Codeine; Morphine and related; and Sulfa antibiotics  MEDICATIONS:  Current Outpatient Prescriptions  Medication Sig Dispense Refill  . Cholecalciferol (VITAMIN D-3) 5000 UNITS TABS Vit d-3  softgels (5,000 iu)-Take one daily      . estradiol (VIVELLE-DOT) 0.0375 MG/24HR Place 1 patch onto the skin 2 (two) times a week.      Marland Kitchen lisinopril (PRINIVIL,ZESTRIL) 10 MG tablet Take 10 mg by mouth daily.      . simvastatin (ZOCOR) 40 MG tablet Take 40 mg by mouth every evening.       No current facility-administered medications for this encounter.    REVIEW OF SYSTEMS:  A 15 point review of systems is documented in the electronic medical record. This was obtained by the nursing staff. However, I reviewed this with the patient to discuss relevant findings and make appropriate changes.  Pertinent items are noted in HPI.   PHYSICAL EXAM: There were no vitals filed for this visit.. . She is a pleasant female in no distress sitting comfortably examining table. She has no palpable cervical supraclavicular or axillary adenopathy bilaterally. The right breast is negative. The left breast has some tenderness in the upper outer quadrant associated with some post biopsy change. She is alert minus x3. She has no focal neurologic deficits. Her strength is 5 out of 5 in her bilateral upper and lower extremities. ECOG performance status 0  LABORATORY DATA:  Lab Results  Component Value Date   WBC 9.5 04/27/2013   HGB 12.4 04/27/2013   HCT 36.5 04/27/2013   MCV 88.3 04/27/2013   PLT 367 04/27/2013   Lab Results  Component Value Date   NA 141 04/27/2013  K 4.3 04/27/2013   CO2 25 04/27/2013   Lab Results  Component Value Date   ALT 13 04/27/2013   AST 15 04/27/2013   ALKPHOS 66 04/27/2013   BILITOT 0.31 04/27/2013     RADIOGRAPHY: US Breast Left  03/30/2013   *RADIOLOGY REPORT*  Clinical Data:  Called back from screening mammogram for possible mass left breast  DIGITAL DIAGNOSTIC LEFT MAMMOGRAM March 30, 2013 AND LEFT BREAST ULTRASOUND:  Comparison:  March 15, 2013, July 16, 2011, June 21, 2010  Findings:  ACR Breast Density Category b: scattered fibro glandular tissue  Spot compression CC and MLO  views of the right breast are submitted.  Previously questioned mass in the left breast 12 o'clock persist.  Ultrasound is performed, showing irregular area of hypo echogenicity at the left breast 12 o'clock 1 cm from the nipple measuring 1.8 x 0.6 x 1.9 cm correlating to the mammographic finding.  IMPRESSION: Suspicious findings.  RECOMMENDATION: Ultrasound guided core biopsy left breast  I have discussed the findings and recommendations with the patient. Results were also provided in writing at the conclusion of the visit.  If applicable, a reminder letter will be sent to the patient regarding the next appointment.  BI-RADS CATEGORY 4:  Suspicious abnormality - biopsy should be considered.   Original Report Authenticated By: Sherian Rein, M.D.   Mr Breast Bilateral W Wo Contrast  04/25/2013   *RADIOLOGY REPORT*  Clinical Data:  New diagnosis of invasive carcinoma with papillary features.  MRI BILATERAL BREASTS WITHOUT AND WITH CONTRAST  Technique: Multiplanar, multisequence MR images of the bilateral breasts were obtained prior to and following the intravenous administration of 9ml of Multihance.  Labs: BUN 15, creatinine 0.9, GFR 63  Comparison:  Recent imaging examinations.  FINDINGS:  Breast composition:  c:  Heterogeneous fibroglandular tissue  Background parenchymal enhancement:  Moderate  Right breast:  No mass or abnormal enhancement.  Left breast: An irregular mass is identified 12 o'clock in the left breast, measuring 2.4 cm AP x 1.3 cm TR x 2.4 cm CC. This correlates with the biopsy proven invasive carcinoma.  Lymph nodes:  No abnormal appearing axillary, subpectoral, or internal mammary lymph nodes. In the lower chest anteriorly, there is a nonspecific 0.6 cm right intercostal lymph node.  Ancillary findings:  Tiny right pleural effusion.  IMPRESSION: 1. A 2.4 x 1.3 x 2.4 cm mass at 12 o'clock in the left breast, correlating with the biopsy proven invasive carcinoma.  No other areas of abnormal  enhancement in either breast. 2. Nonspecific 0.6 cm right intercostal lymph node.  RECOMMENDATION: Treatment planning.  BI-RADS CATEGORY 6:  Known biopsy-proven malignancy - appropriate action should be taken.  THREE-DIMENSIONAL MR IMAGE RENDERING ON INDEPENDENT WORKSTATION:  Three-dimensional MR images were rendered by post-processing of the original MR data on a DynaCad workstation.  The three-dimensional MR images were interpreted, and findings were reported in the accompanying complete MRI report for this study.   Original Report Authenticated By: Jerene Dilling, M.D.   Mm Digital Diagnostic Unilat L  04/18/2013   **ADDENDUM** CREATED: 04/18/2013 17:01:04  The final pathological diagnosis is invasive carcinoma with papillary features.  This is concordant with the imaging findings.  The final pathological diagnosis was discussed with the patient by telephone on 04/18/2013.  Her questions were answered.  She reported some pain and itchiness at the biopsy site with no bruising or palpable hematoma.  She will be seen in the Multidisciplinary Clinic on 04/27/2013.  Addended by:  Viviann Spare  Julianne Handler, M.D. on 04/18/2013 17:01:04.  **END ADDENDUM** SIGNED BY: Londell Moh. Azucena Kuba, M.D.  04/15/2013   *RADIOLOGY REPORT*  Clinical Data:  Titanium marker placement following ultrasound guided core needle biopsy earlier today.  DIGITAL DIAGNOSTIC LEFT MAMMOGRAM  Comparison:  Previous examinations.  Findings:  Digital mammographic images were performed following ultrasound guided biopsy of a 1.8 cm 12 o'clock left breast mass. These demonstrate a T-shaped biopsy marker clip at the expected position of the biopsied mass.  IMPRESSION: Appropriate clip deployment following left breast ultrasound guided core needle biopsy.   Original Report Authenticated By: Beckie Salts, M.D.   Mm Digital Diagnostic Unilat L  03/30/2013   *RADIOLOGY REPORT*  Clinical Data:  Called back from screening mammogram for possible mass left breast  DIGITAL  DIAGNOSTIC LEFT MAMMOGRAM March 30, 2013 AND LEFT BREAST ULTRASOUND:  Comparison:  March 15, 2013, July 16, 2011, June 21, 2010  Findings:  ACR Breast Density Category b: scattered fibro glandular tissue  Spot compression CC and MLO views of the right breast are submitted.  Previously questioned mass in the left breast 12 o'clock persist.  Ultrasound is performed, showing irregular area of hypo echogenicity at the left breast 12 o'clock 1 cm from the nipple measuring 1.8 x 0.6 x 1.9 cm correlating to the mammographic finding.  IMPRESSION: Suspicious findings.  RECOMMENDATION: Ultrasound guided core biopsy left breast  I have discussed the findings and recommendations with the patient. Results were also provided in writing at the conclusion of the visit.  If applicable, a reminder letter will be sent to the patient regarding the next appointment.  BI-RADS CATEGORY 4:  Suspicious abnormality - biopsy should be considered.   Original Report Authenticated By: Sherian Rein, M.D.   [place] Lt Breast Bx W Loc Dev 1st Lesion Img Bx Spec US Guide  04/15/2013   **ADDENDUM** CREATED: 04/15/2013 17:35:52  Preliminary physical examination and ultrasound of the left axilla demonstrated no abnormal lymph nodes.  **END ADDENDUM** SIGNED BY: Londell Moh. Azucena Kuba, M.D.  04/15/2013   *RADIOLOGY REPORT*  Clinical Data:  1.8 cm mass in the 12 o'clock position of the left breast with imaging features suspicious for malignancy.  ULTRASOUND GUIDED VACUUM ASSISTED CORE BIOPSY OF THE LEFT BREAST  I met with the patient, and we discussed the procedure of ultrasound-guided biopsy, including risks.  Specifically, we discussed the risks of bleeding, infection and clip migration.  We discussed the high likelihood of a successful procedure.  Informed, written consent was given.  Using sterile technique, local anesthesia, ultrasound guidance, and a 12 gauge vacuum assisted needle, biopsy was performed of the recently demonstrated 1.8 cm mass in the 12  o'clock position of the left breast.  An inferolateral approach was used.  At the conclusion of the procedure, a tissue marker clip was deployed into the biopsy cavity.  A follow-up 2-view mammogram was performed and dictated separately.  IMPRESSION: Ultrasound-guided biopsy of 1.8 cm mass in the 12 o'clock position of the left breast.  No apparent complications.   Original Report Authenticated By: Beckie Salts, M.D.      IMPRESSION: T2 N0 triple negative left breast cancer  PLAN: I discussed with Ellen Beltran her husband her diagnosis and options for treatment. We discussed discontinuing her estrogen patch. Despite the tumor sample which she received with her biopsy not being sensitive to estrogen our recommendations are still to discontinue use of the patch in case there are arch of her tumor that are sensitive to estrogen. We discussed  neoadjuvant chemotherapy in order to perform breast conservation. I believe she had a lumpectomy upfront she would have significant cosmetic deformity. She has small breasts as compared to her tumor size. We discussed the equivalency in terms of survival between neoadjuvant and adjuvant chemotherapy. We discussed port placement and the necessity of radiation after lumpectomy even if she has a pathologic complete response. We discussed the process of simulation the placement tattoos. We discussed 6 weeks treatment as an outpatient. We discussed skin redness and fatigue as possible side effects. She is understandably nervous about receiving chemotherapy as her mother was recently treated for lung cancer and died of that malignancy. We'll meet with our navigator as well as member of our patient family support staff. We'll plan on seeing her back after her surgery.  I spent 60 minutes  face to face with the patient and more than 50% of that time was spent in counseling and/or coordination of care.   ------------------------------------------------  Lurline Hare, MD

## 2013-04-27 NOTE — Progress Notes (Signed)
 Re:   Mathea N Hersman DOB:   05/20/1946 MRN:   [id number]  [facility]  ASSESSMENT AND PLAN: 1.  Left breast cancer, T2, N0  IDC with papillary features, ER - 0%, PR - 0%, Her2Neu - neg, Ki67 - 47%  2.4 cm on MRI  Oncology - [facility]   I discussed the options for breast cancer treatment with the patient.  The patient is at the multidisciplinary clinic and understands the treatment of breast cancer includes medical oncology and radiation oncology.  I discussed the surgical options of lumpectomy vs. mastectomy.  If mastectomy, there is the possibility of reconstruction.   I discussed the options of lymph node biopsy.  The treatment plan depends on the pathologic staging of the tumor and the patient's personal wishes.  The risks of surgery include, but are not limited to, bleeding, infection, the need for further surgery, and nerve injury.  The patient has been given literature on the treatment of breast cancer.  The plan, however, is to do neoadjuvant therapy to help shrink the primary tumor for better margin control.  Plan: 1) Power port, 2) Neoadjuvant chemotx, 3) lumpectomy and SLNBx, 4) Radiation tx  2.  Power port  Discussed indications and complications with the patient.  Her mother had lung cancer and had a a port - so she is familiar with this.  I discussed the potential complications, including, but not limited to bleeding, infection, pneumothorax, and thrombisis.  3. HTN 4.  Hypercholesterolemia  No chief complaint on file.  REFERRING PHYSICIAN: WILSON,FRED HENRY, MD  HISTORY OF PRESENT ILLNESS: Tora N Miner is a 67 y.o. (DOB: 11/23/1945)  white  female whose primary care physician is WILSON,FRED HENRY, MD and comes to the [facility] today for left breast cancer. She was seeing Dr. Lomax, but is now seeing Dr. Neal from a Gyn standpoint. Her husband is with her.  She went for a routine mammogram 03/30/2013 at [facility].  She had a suspicious area at 12 o'clock in  the left breast.  Her last mammogram was 2012. A biopsy 04/15/2013 revealed (Accession: [id number]) triple negative invasive cancer with papillary features. An MRI on 04/25/2013 show a 2.4 x 2.4 x 1.3 cm mass at the 12 o'clock position of the left breast. She is on hormones, which she has stopped.  She has no family history of breast cancer.  She had a hysterectomy in 2002 for benign disease.  Past Medical History  Diagnosis Date  . Hypertension   . Hyperlipidemia   . Osteopenia       Past Surgical History  Procedure Laterality Date  . Total abdominal hysterectomy    . Tubal ligation        Current Outpatient Prescriptions  Medication Sig Dispense Refill  . Cholecalciferol (VITAMIN D-3) 5000 UNITS TABS Vit d-3 softgels (5,000 iu)-Take one daily      . estradiol (VIVELLE-DOT) 0.0375 MG/24HR Place 1 patch onto the skin 2 (two) times a week.      . lisinopril (PRINIVIL,ZESTRIL) 10 MG tablet Take 10 mg by mouth daily.      . simvastatin (ZOCOR) 40 MG tablet Take 40 mg by mouth every evening.       No current facility-administered medications for this visit.      Allergies  Allergen Reactions  . Codeine Other (See Comments)    Sees unusual things  . Morphine And Related Nausea Only  . Sulfa Antibiotics Rash    REVIEW OF SYSTEMS: Skin:    No history of rash.  No history of abnormal moles. Infection:  No history of hepatitis or HIV.  No history of MRSA. Neurologic:  No history of stroke.  No history of seizure.  No history of headaches. Cardiac:  Hypertension since about 2004. Pulmonary:  Does not smoke cigarettes.  No asthma or bronchitis.  No OSA/CPAP.  Endocrine:  No diabetes. No thyroid disease. Gastrointestinal:  No history of stomach disease.  No history of liver disease.  No history of gall bladder disease.  No history of pancreas disease.  Negative colonoscopy by Dr. Perry 2014. Urologic:  No history of kidney stones.  No history of bladder infections. GYN:  Hysterectomy  2002. Musculoskeletal:  No history of joint or back disease. Hematologic:  No bleeding disorder.  No history of anemia.  Not anticoagulated. Psycho-social:  The patient is oriented.   The patient has no obvious psychologic or social impairment to understanding our conversation and plan.  SOCIAL and FAMILY HISTORY: Married. 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 Has 2 daughters - ages 45 and 41. Her mother had lung cancer and needed a porta cath. Her husband had hernia surgery by Dr. Tsuie.  PHYSICAL EXAM: There were no vitals taken for this visit.  General: WN WF who is alert and generally healthy appearing.  HEENT: Normal. Pupils equal. Neck: Supple. No mass.  No thyroid mass. Lymph Nodes:  No supraclavicular, cervical, or axillary nodes. Lungs: Clear to auscultation and symmetric breath sounds. Heart:  RRR. No murmur or rub. Breasts: Right - no obvious mass  Left - Though some minimal bruising, I feel no obvious mass.  She has no nipple discharge.  Abdomen: Soft. No mass. No tenderness. No hernia. Normal bowel sounds.  No abdominal scars. Rectal: Not done. Extremities:  Good strength and ROM  in upper and lower extremities. Neurologic:  Grossly intact to motor and sensory function. Psychiatric: Has normal mood and affect. Behavior is normal.   DATA REVIEWED: Epic notes and x-rays.  Leea Rambeau, MD,  FACS [facility] [address]  [address]   [place], [place]    [address] Phone:  [phone] FAX:  [phone]  

## 2013-04-27 NOTE — Progress Notes (Signed)
Checked in new pt with no financial concerns. °

## 2013-04-28 ENCOUNTER — Encounter (HOSPITAL_COMMUNITY): Payer: Self-pay | Admitting: Pharmacy Technician

## 2013-04-28 ENCOUNTER — Other Ambulatory Visit (INDEPENDENT_AMBULATORY_CARE_PROVIDER_SITE_OTHER): Payer: Self-pay | Admitting: Surgery

## 2013-04-28 ENCOUNTER — Other Ambulatory Visit (HOSPITAL_COMMUNITY): Payer: Self-pay | Admitting: Surgery

## 2013-04-28 NOTE — Progress Notes (Signed)
[facility]bc with dif, cmet 04-27-2013 epic

## 2013-04-28 NOTE — Progress Notes (Signed)
Need orders in EPIC.  Surgery scheduled for 05/02/13.  Thank You.  

## 2013-04-28 NOTE — Patient Instructions (Addendum)
20 Ellen Beltran  04/28/2013   Your procedure is scheduled on: 05-02-2013  Report to [facility] at 100 PM  Call this number if you have problems the morning of surgery [phone]   Remember:   Do not eat food  :After Midnight.   clear liquids midnight until 925 am day of surgery, then nothing by mouth   Take these medicines the morning of surgery with A SIP OF WATER: no meds to take                                SEE [facility] PREPARING FOR SURGERY SHEET   Do not wear jewelry, make-up or nail polish.  Do not wear lotions, powders, or perfumes. You may wear deodorant.   Men may shave face and neck.  Do not bring valuables to the hospital. [facility] IS NOT RESPONSIBLE FOR VALUEABLES.  Contacts, dentures or bridgework may not be worn into surgery.  Leave suitcase in the car. After surgery it may be brought to your room.  For patients admitted to the hospital, checkout time is 11:00 AM the day of discharge.   Patients discharged the day of surgery will not be allowed to drive home.  Name and phone number of your driver:tommy spouse cell [phone]  Special Instructions: N/A    Call Cain Sieve RN pre op nurse if needed [phone]    FAILURE TO FOLLOW THESE INSTRUCTIONS MAY RESULT IN THE CANCELLATION OF YOUR SURGERY.  PATIENT SIGNATURE___________________________________________  NURSE SIGNATURE_____________________________________________

## 2013-04-29 ENCOUNTER — Ambulatory Visit (HOSPITAL_COMMUNITY)
Admission: RE | Admit: 2013-04-29 | Discharge: 2013-04-29 | Disposition: A | Discharge status: Home / Self Care | Payer: Medicare Other | Source: Ambulatory Visit | Attending: Surgery | Admitting: Surgery

## 2013-04-29 ENCOUNTER — Encounter (HOSPITAL_COMMUNITY): Payer: Self-pay

## 2013-04-29 ENCOUNTER — Other Ambulatory Visit: Payer: Self-pay | Admitting: *Deleted

## 2013-04-29 ENCOUNTER — Encounter (HOSPITAL_COMMUNITY)
Admission: RE | Admit: 2013-04-29 | Discharge: 2013-04-29 | Disposition: A | Discharge status: Home / Self Care | Payer: Medicare Other | Source: Ambulatory Visit | Attending: Surgery | Admitting: Surgery

## 2013-04-29 ENCOUNTER — Telehealth: Payer: Self-pay | Admitting: *Deleted

## 2013-04-29 ENCOUNTER — Telehealth: Payer: Self-pay | Admitting: Oncology

## 2013-04-29 DIAGNOSIS — Z01812 Encounter for preprocedural laboratory examination: Secondary | ICD-10-CM | POA: Insufficient documentation

## 2013-04-29 DIAGNOSIS — I498 Other specified cardiac arrhythmias: Secondary | ICD-10-CM | POA: Insufficient documentation

## 2013-04-29 DIAGNOSIS — Z01810 Encounter for preprocedural cardiovascular examination: Secondary | ICD-10-CM | POA: Insufficient documentation

## 2013-04-29 DIAGNOSIS — I10 Essential (primary) hypertension: Secondary | ICD-10-CM | POA: Insufficient documentation

## 2013-04-29 DIAGNOSIS — Z01818 Encounter for other preprocedural examination: Secondary | ICD-10-CM | POA: Insufficient documentation

## 2013-04-29 HISTORY — DX: Malignant (primary) neoplasm, unspecified: C80.1

## 2013-04-29 MED ORDER — UNABLE TO FIND: Status: DC

## 2013-04-29 NOTE — Telephone Encounter (Signed)
Per staff phone call and POF I have schedueld appts.  JMW  

## 2013-04-29 NOTE — Telephone Encounter (Signed)
, °

## 2013-05-02 ENCOUNTER — Encounter (HOSPITAL_COMMUNITY): Payer: Self-pay | Admitting: *Deleted

## 2013-05-02 ENCOUNTER — Ambulatory Visit (HOSPITAL_COMMUNITY): Payer: Medicare Other

## 2013-05-02 ENCOUNTER — Telehealth: Payer: Self-pay | Admitting: *Deleted

## 2013-05-02 ENCOUNTER — Encounter (HOSPITAL_COMMUNITY): Payer: Self-pay | Admitting: Anesthesiology

## 2013-05-02 ENCOUNTER — Encounter: Payer: Self-pay | Admitting: Oncology

## 2013-05-02 ENCOUNTER — Ambulatory Visit (HOSPITAL_COMMUNITY)
Admission: RE | Admit: 2013-05-02 | Discharge: 2013-05-02 | Disposition: A | Discharge status: Home / Self Care | Payer: Medicare Other | Source: Ambulatory Visit | Attending: Surgery | Admitting: Surgery

## 2013-05-02 ENCOUNTER — Encounter (HOSPITAL_COMMUNITY)
Admission: RE | Disposition: A | Discharge status: Home / Self Care | Payer: Self-pay | Source: Ambulatory Visit | Attending: Surgery

## 2013-05-02 ENCOUNTER — Ambulatory Visit (HOSPITAL_COMMUNITY): Payer: Medicare Other | Admitting: Anesthesiology

## 2013-05-02 ENCOUNTER — Encounter: Payer: Self-pay | Admitting: *Deleted

## 2013-05-02 DIAGNOSIS — C50112 Malignant neoplasm of central portion of left female breast: Secondary | ICD-10-CM

## 2013-05-02 DIAGNOSIS — E7800 Pure hypercholesterolemia, unspecified: Secondary | ICD-10-CM | POA: Insufficient documentation

## 2013-05-02 DIAGNOSIS — C50919 Malignant neoplasm of unspecified site of unspecified female breast: Secondary | ICD-10-CM | POA: Insufficient documentation

## 2013-05-02 DIAGNOSIS — E785 Hyperlipidemia, unspecified: Secondary | ICD-10-CM | POA: Insufficient documentation

## 2013-05-02 DIAGNOSIS — M899 Disorder of bone, unspecified: Secondary | ICD-10-CM | POA: Insufficient documentation

## 2013-05-02 DIAGNOSIS — I10 Essential (primary) hypertension: Secondary | ICD-10-CM | POA: Insufficient documentation

## 2013-05-02 HISTORY — PX: PORTACATH PLACEMENT: SHX2246

## 2013-05-02 SURGERY — INSERTION, TUNNELED CENTRAL VENOUS DEVICE, WITH PORT
Anesthesia: Monitor Anesthesia Care | Site: Chest | Wound class: Clean

## 2013-05-02 MED ORDER — LACTATED RINGERS IV SOLN
INTRAVENOUS | Status: DC
  Administered 2013-05-02: 17:00:00 via INTRAVENOUS

## 2013-05-02 MED ORDER — FENTANYL CITRATE 0.05 MG/ML IJ SOLN
INTRAMUSCULAR | Status: DC | PRN
  Administered 2013-05-02: 50 ug via INTRAVENOUS
  Administered 2013-05-02 (×2): 25 ug via INTRAVENOUS

## 2013-05-02 MED ORDER — LIDOCAINE HCL 1 % IJ SOLN
INTRAMUSCULAR | Status: DC | PRN
  Administered 2013-05-02: 50 mg via INTRADERMAL

## 2013-05-02 MED ORDER — HEPARIN SOD (PORK) LOCK FLUSH 100 UNIT/ML IV SOLN
INTRAVENOUS | Status: AC
  Filled 2013-05-02: qty 5

## 2013-05-02 MED ORDER — ONDANSETRON HCL 4 MG/2ML IJ SOLN
INTRAMUSCULAR | Status: DC | PRN
  Administered 2013-05-02: 4 mg via INTRAVENOUS

## 2013-05-02 MED ORDER — LIDOCAINE HCL 1 % IJ SOLN
INTRAMUSCULAR | Status: AC
  Filled 2013-05-02: qty 20

## 2013-05-02 MED ORDER — MIDAZOLAM HCL 5 MG/5ML IJ SOLN
INTRAMUSCULAR | Status: DC | PRN
  Administered 2013-05-02: 2 mg via INTRAVENOUS

## 2013-05-02 MED ORDER — LIDOCAINE HCL (PF) 1 % IJ SOLN
INTRAMUSCULAR | Status: DC | PRN
  Administered 2013-05-02: 4 mL

## 2013-05-02 MED ORDER — FENTANYL CITRATE 0.05 MG/ML IJ SOLN: 25.0000 ug | INTRAMUSCULAR | Status: DC | PRN

## 2013-05-02 MED ORDER — PROPOFOL 10 MG/ML IV BOLUS
INTRAVENOUS | Status: DC | PRN
  Administered 2013-05-02: 150 mg via INTRAVENOUS

## 2013-05-02 MED ORDER — LACTATED RINGERS IV SOLN
INTRAVENOUS | Status: DC
  Administered 2013-05-02: 1000 mL via INTRAVENOUS

## 2013-05-02 MED ORDER — SODIUM CHLORIDE 0.9 % IR SOLN
Freq: Once | Status: AC
  Administered 2013-05-02: 15:00:00
  Filled 2013-05-02: qty 1.2

## 2013-05-02 MED ORDER — SODIUM CHLORIDE 0.9 % IR SOLN
Status: DC | PRN
  Administered 2013-05-02: 1000 mL

## 2013-05-02 MED ORDER — CEFAZOLIN SODIUM-DEXTROSE 2-3 GM-% IV SOLR
2.0000 g | INTRAVENOUS | Status: AC
  Administered 2013-05-02: 2 g via INTRAVENOUS

## 2013-05-02 MED ORDER — CEFAZOLIN SODIUM-DEXTROSE 2-3 GM-% IV SOLR
INTRAVENOUS | Status: AC
  Filled 2013-05-02: qty 50

## 2013-05-02 MED ORDER — HEPARIN SOD (PORK) LOCK FLUSH 100 UNIT/ML IV SOLN
INTRAVENOUS | Status: DC | PRN
  Administered 2013-05-02: 400 [IU] via INTRAVENOUS

## 2013-05-02 SURGICAL SUPPLY — 40 items
APL SKNCLS STERI-STRIP NONHPOA (GAUZE/BANDAGES/DRESSINGS) ×1
BAG DECANTER FOR FLEXI CONT (MISCELLANEOUS) ×2 IMPLANT
BENZOIN TINCTURE PRP APPL 2/3 (GAUZE/BANDAGES/DRESSINGS) ×2 IMPLANT
BLADE SURG SZ10 CARB STEEL (BLADE) ×2 IMPLANT
CHLORAPREP W/TINT 10.5 ML (MISCELLANEOUS) ×2 IMPLANT
CLOTH BEACON ORANGE TIMEOUT ST (SAFETY) ×2 IMPLANT
DECANTER SPIKE VIAL GLASS SM (MISCELLANEOUS) ×2 IMPLANT
DRAPE C-ARM 42X120 X-RAY (DRAPES) ×2 IMPLANT
DRAPE LAPAROTOMY TRNSV 102X78 (DRAPE) ×2 IMPLANT
ELECT REM PT RETURN 9FT ADLT (ELECTROSURGICAL) ×2
ELECTRODE REM PT RTRN 9FT ADLT (ELECTROSURGICAL) ×1 IMPLANT
GAUZE SPONGE 2X2 8PLY STRL LF (GAUZE/BANDAGES/DRESSINGS) ×1 IMPLANT
GAUZE SPONGE 4X4 16PLY XRAY LF (GAUZE/BANDAGES/DRESSINGS) ×2 IMPLANT
GLOVE BIOGEL PI IND STRL 7.0 (GLOVE) ×1 IMPLANT
GLOVE BIOGEL PI INDICATOR 7.0 (GLOVE) ×1
GLOVE SURG SIGNA 7.5 PF LTX (GLOVE) ×2 IMPLANT
GOWN STRL NON-REIN LRG LVL3 (GOWN DISPOSABLE) ×2 IMPLANT
GOWN STRL REIN XL XLG (GOWN DISPOSABLE) ×4 IMPLANT
IV CATHETER PAT DEVICE CLC2000 (IV SETS) IMPLANT
KIT BASIN OR (CUSTOM PROCEDURE TRAY) ×2 IMPLANT
KIT PORT POWER 8FR ISP CVUE (Catheter) ×1 IMPLANT
KIT POWER CATH 8FR (Catheter) IMPLANT
MARKER SKIN DUAL TIP RULER LAB (MISCELLANEOUS) ×2 IMPLANT
NDL HYPO 25X1 1.5 SAFETY (NEEDLE) ×1 IMPLANT
NEEDLE HYPO 22GX1.5 SAFETY (NEEDLE) ×2 IMPLANT
NEEDLE HYPO 25X1 1.5 SAFETY (NEEDLE) ×2 IMPLANT
NS IRRIG 1000ML POUR BTL (IV SOLUTION) ×2 IMPLANT
PACK BASIC VI WITH GOWN DISP (CUSTOM PROCEDURE TRAY) ×2 IMPLANT
PENCIL BUTTON HOLSTER BLD 10FT (ELECTRODE) ×2 IMPLANT
SPONGE GAUZE 2X2 STER 10/PKG (GAUZE/BANDAGES/DRESSINGS)
SPONGE GAUZE 4X4 12PLY (GAUZE/BANDAGES/DRESSINGS) ×3 IMPLANT
STRIP CLOSURE SKIN 1/4X4 (GAUZE/BANDAGES/DRESSINGS) ×2 IMPLANT
SUT VIC AB 3-0 SH 18 (SUTURE) ×2 IMPLANT
SUT VIC AB 5-0 PS2 18 (SUTURE) ×2 IMPLANT
SYR BULB IRRIGATION 50ML (SYRINGE) ×2 IMPLANT
SYR CONTROL 10ML LL (SYRINGE) ×2 IMPLANT
SYRINGE 10CC LL (SYRINGE) ×2 IMPLANT
TAPE CLOTH SURG 4X10 WHT LF (GAUZE/BANDAGES/DRESSINGS) ×1 IMPLANT
TOWEL OR 17X26 10 PK STRL BLUE (TOWEL DISPOSABLE) ×2 IMPLANT
WATER STERILE IRR 1500ML POUR (IV SOLUTION) ×2 IMPLANT

## 2013-05-02 NOTE — Op Note (Signed)
05/02/2013  3:40 PM  PATIENT:  Ellen Beltran, 67 y.o., female MRN: [id number] DOB: 1945-11-01  PREOP DIAGNOSIS:  left breast cancer  POSTOP DIAGNOSIS:   Left breast cancer (T2, N0), anticipate chemotherapy  PROCEDURE:   Procedure(s):  INSERTION PORT-A-CATH (Clear Vue Power Port) - right subclavian vein  SURGEON:   Ovidio Kin, M.D.  ASSISTANT:   None  ANESTHESIA:   general  Anesthesiologist: Einar Pheasant, MD CRNA: Vista Lawman, CRNA  General  EBL:  minimal  ml  COUNTS CORRECT:  YES  INDICATIONS FOR PROCEDURE:  [name] is a 67 y.o. (DOB: 05/19/1946) white female whose primary care physician is [name], MD and comes for power port placement for the treatment of left cancer.  Dr. Welton Flakes is her treating oncologist.   The indications and risks of the surgery were explained to the patient.  The risks include, but are not limited to, infection, bleeding, pneumothorax, nerve injury, and thrombosis of the vein.  OPERATIVE NOTE:  The patient was taken to Room #1 at [facility].  Anesthesia was provided by Anesthesiologist: Einar Pheasant, MD CRNA: Vista Lawman, CRNA.  At the beginning of the operation, the patient was given 2 gm Ancef, had a roll placed under her back, and had the upper chest/neck prepped with Chloroprep and draped.   A time out was held and the surgery checklist reviewed.   The patient was placed in Trendelenburg position.  The right subclavian vein was accessed with a 16 gauge needle and a guide wire threaded through the needle into the vein.  The position of the wire was checked with fluoroscopy.   I then developed a pocket in the upper inner aspect of the right chest for the port reservoir.  I used the Clear Vue [employer] for venous access.  The reservoir was sewn in place with a 3-0 Vicryl suture.  The reservoir had been flushed with dilute (10 units/cc) heparin.   I then passed the silastic tubing from the reservoir  incision to the subclavian stick site and used the 8 French introducer to pass it into the vein.  The tip of the silastic catheter was position at the junction of the SVC and the right atrium under fluoroscopy.  The silastic catheter was then attached to the port with the bayonet device.     The entire port and tubing were checked with fluoroscopy and then the port was flushed with concentrated heparin (100 units/cc).   The wounds were then closed with 3-0 vicryl subcutaneous sutures and the skin closed with a 5-0 Vicryl suture.  The skin was painted with tincture of benzoin and steri-stripped.   The patient was transferred to the recovery room in good condition.  The sponge and needle count were correct at the end of the case.  A CXR is ordered for port placement and pending at the time of this note.  Ovidio Kin, MD, [facility] Pager: [phone] Office phone:  [phone]

## 2013-05-02 NOTE — Interval H&P Note (Signed)
History and Physical Interval Note:  05/02/2013 2:43 PM  Ellen Beltran  has presented today for surgery, with the diagnosis of left breast cancer  The various methods of treatment have been discussed with the patient and family.  Husband, [name], is here.   After consideration of risks, benefits and other options for treatment, the patient has consented to  Procedure(s): INSERTION PORT-A-CATH (N/A) as a surgical intervention .    The patient's history has been reviewed, patient examined, no change in status, stable for surgery.  I have reviewed the patient's chart and labs.  Questions were answered to the patient's satisfaction.     [name] H

## 2013-05-02 NOTE — Progress Notes (Signed)
Faxed ondansetron pa form to [employer] °

## 2013-05-02 NOTE — H&P (View-Only) (Signed)
Re:   Ellen Beltran DOB:   02-18-1946 MRN:   [id number]  [facility]  ASSESSMENT AND PLAN: 1.  Left breast cancer, T2, N0  IDC with papillary features, ER - 0%, PR - 0%, Her2Neu - neg, Ki67 - 47%  2.4 cm on MRI  Oncology - Welton Flakes and Michell Heinrich   I discussed the options for breast cancer treatment with the patient.  The patient is at the multidisciplinary clinic and understands the treatment of breast cancer includes medical oncology and radiation oncology.  I discussed the surgical options of lumpectomy vs. mastectomy.  If mastectomy, there is the possibility of reconstruction.   I discussed the options of lymph node biopsy.  The treatment plan depends on the pathologic staging of the tumor and the patient's personal wishes.  The risks of surgery include, but are not limited to, bleeding, infection, the need for further surgery, and nerve injury.  The patient has been given literature on the treatment of breast cancer.  The plan, however, is to do neoadjuvant therapy to help shrink the primary tumor for better margin control.  Plan: 1) Power port, 2) Neoadjuvant chemotx, 3) lumpectomy and SLNBx, 4) Radiation tx  2.  Power port  Discussed indications and complications with the patient.  Her mother had lung cancer and had a a port - so she is familiar with this.  I discussed the potential complications, including, but not limited to bleeding, infection, pneumothorax, and thrombisis.  3. HTN 4.  Hypercholesterolemia  No chief complaint on file.  REFERRING PHYSICIAN: [name], MD  HISTORY OF PRESENT ILLNESS: Ellen Beltran is a 67 y.o. (DOB: Apr 11, 1946)  white  female whose primary care physician is [name], MD and comes to the [facility] today for left breast cancer. She was seeing Dr. Nicholas Lose, but is now seeing Dr. Jennette Kettle from a Gyn standpoint. Her husband is with her.  She went for a routine mammogram 03/30/2013 at [facility].  She had a suspicious area at 12 o'clock in  the left breast.  Her last mammogram was 2012. A biopsy 04/15/2013 revealed (Accession: [id number]) triple negative invasive cancer with papillary features. An MRI on 04/25/2013 show a 2.4 x 2.4 x 1.3 cm mass at the 12 o'clock position of the left breast. She is on hormones, which she has stopped.  She has no family history of breast cancer.  She had a hysterectomy in 2002 for benign disease.  Past Medical History  Diagnosis Date  . Hypertension   . Hyperlipidemia   . Osteopenia       Past Surgical History  Procedure Laterality Date  . Total abdominal hysterectomy    . Tubal ligation        Current Outpatient Prescriptions  Medication Sig Dispense Refill  . Cholecalciferol (VITAMIN D-3) 5000 UNITS TABS Vit d-3 softgels (5,000 iu)-Take one daily      . estradiol (VIVELLE-DOT) 0.0375 MG/24HR Place 1 patch onto the skin 2 (two) times a week.      Marland Kitchen lisinopril (PRINIVIL,ZESTRIL) 10 MG tablet Take 10 mg by mouth daily.      . simvastatin (ZOCOR) 40 MG tablet Take 40 mg by mouth every evening.       No current facility-administered medications for this visit.      Allergies  Allergen Reactions  . Codeine Other (See Comments)    Sees unusual things  . Morphine And Related Nausea Only  . Sulfa Antibiotics Rash    REVIEW OF SYSTEMS: Skin:  No history of rash.  No history of abnormal moles. Infection:  No history of hepatitis or HIV.  No history of MRSA. Neurologic:  No history of stroke.  No history of seizure.  No history of headaches. Cardiac:  Hypertension since about 2004. Pulmonary:  Does not smoke cigarettes.  No asthma or bronchitis.  No OSA/CPAP.  Endocrine:  No diabetes. No thyroid disease. Gastrointestinal:  No history of stomach disease.  No history of liver disease.  No history of gall bladder disease.  No history of pancreas disease.  Negative colonoscopy by Dr. Marina Goodell 2014. Urologic:  No history of kidney stones.  No history of bladder infections. GYN:  Hysterectomy  2002. Musculoskeletal:  No history of joint or back disease. Hematologic:  No bleeding disorder.  No history of anemia.  Not anticoagulated. Psycho-social:  The patient is oriented.   The patient has no obvious psychologic or social impairment to understanding our conversation and plan.  SOCIAL and FAMILY HISTORY: Married. 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 Has 2 daughters - ages 39 and 32. Her mother had lung cancer and needed a porta cath. Her husband had hernia surgery by Dr. Gwinda Passe.  PHYSICAL EXAM: There were no vitals taken for this visit.  General: WN WF who is alert and generally healthy appearing.  HEENT: Normal. Pupils equal. Neck: Supple. No mass.  No thyroid mass. Lymph Nodes:  No supraclavicular, cervical, or axillary nodes. Lungs: Clear to auscultation and symmetric breath sounds. Heart:  RRR. No murmur or rub. Breasts: Right - no obvious mass  Left - Though some minimal bruising, I feel no obvious mass.  She has no nipple discharge.  Abdomen: Soft. No mass. No tenderness. No hernia. Normal bowel sounds.  No abdominal scars. Rectal: Not done. Extremities:  Good strength and ROM  in upper and lower extremities. Neurologic:  Grossly intact to motor and sensory function. Psychiatric: Has normal mood and affect. Behavior is normal.   DATA REVIEWED: Epic notes and x-rays.  Ovidio Kin, MD,  [facility] [address]  [address]   [place], [place]    [address] Phone:  [phone] FAX:  [phone]

## 2013-05-02 NOTE — Preoperative (Signed)
Beta Blockers   Reason not to administer Beta Blockers:Not Applicable 

## 2013-05-02 NOTE — Telephone Encounter (Signed)
Attempted to call patient but unable to leave message. Will try again later.

## 2013-05-02 NOTE — Anesthesia Postprocedure Evaluation (Signed)
  Anesthesia Post-op Note  Patient: Ellen Beltran  Procedure(s) Performed: Procedure(s) (LRB): INSERTION PORT-A-CATH (N/A)  Patient Location: PACU  Anesthesia Type: General  Level of Consciousness: awake and alert   Airway and Oxygen Therapy: Patient Spontanous Breathing  Post-op Pain: mild  Post-op Assessment: Post-op Vital signs reviewed, Patient's Cardiovascular Status Stable, Respiratory Function Stable, Patent Airway and No signs of Nausea or vomiting  Last Vitals:  Filed Vitals:   05/02/13 1645  BP: 127/52  Pulse: 53  Temp:   Resp: 17    Post-op Vital Signs: stable   Complications: No apparent anesthesia complications

## 2013-05-02 NOTE — Anesthesia Preprocedure Evaluation (Addendum)
Anesthesia Evaluation  Patient identified by MRN, date of birth, ID band Patient awake    Reviewed: Allergy & Precautions, H&P , NPO status , Patient's Chart, lab work & pertinent test results  Airway Mallampati: II TM Distance: >3 FB Neck ROM: full    Dental no notable dental hx. (+) Teeth Intact and Dental Advisory Given   Pulmonary neg pulmonary ROS,  breath sounds clear to auscultation  Pulmonary exam normal       Cardiovascular Exercise Tolerance: Good hypertension, Pt. on medications Rhythm:regular Rate:Normal     Neuro/Psych negative neurological ROS  negative psych ROS   GI/Hepatic negative GI ROS, Neg liver ROS,   Endo/Other  negative endocrine ROS  Renal/GU negative Renal ROS  negative genitourinary   Musculoskeletal   Abdominal   Peds  Hematology negative hematology ROS (+)   Anesthesia Other Findings Breast cancer  Reproductive/Obstetrics negative OB ROS                          Anesthesia Physical Anesthesia Plan  ASA: II  Anesthesia Plan: General   Post-op Pain Management:    Induction:   Airway Management Planned: LMA  Additional Equipment:   Intra-op Plan:   Post-operative Plan:   Informed Consent: I have reviewed the patients History and Physical, chart, labs and discussed the procedure including the risks, benefits and alternatives for the proposed anesthesia with the patient or authorized representative who has indicated his/her understanding and acceptance.   Dental Advisory Given  Plan Discussed with: CRNA and Surgeon  Anesthesia Plan Comments:        Anesthesia Quick Evaluation

## 2013-05-02 NOTE — Progress Notes (Signed)
RECEIVED A FAX FROM [employer] CON[employer]ERNING A PRIOR AUTHORIZATION FOR ONDANSETRON. THIS REQUEST WAS PLACED IN THE MANAGED CARE BIN. 

## 2013-05-02 NOTE — Transfer of Care (Signed)
Immediate Anesthesia Transfer of Care Note  Patient: Ellen Beltran  Procedure(s) Performed: Procedure(s) (LRB): INSERTION PORT-A-CATH (N/A)  Patient Location: PACU  Anesthesia Type: General  Level of Consciousness: sedated, patient cooperative and responds to stimulaton  Airway & Oxygen Therapy: Patient Spontanous Breathing and Patient connected to face mask oxgen  Post-op Assessment: Report given to PACU RN and Post -op Vital signs reviewed and stable  Post vital signs: Reviewed and stable  Complications: No apparent anesthesia complications

## 2013-05-03 ENCOUNTER — Encounter (HOSPITAL_COMMUNITY): Payer: Self-pay | Admitting: Surgery

## 2013-05-03 ENCOUNTER — Other Ambulatory Visit: Payer: Self-pay | Admitting: Certified Registered Nurse Anesthetist

## 2013-05-03 ENCOUNTER — Encounter: Payer: Self-pay | Admitting: Oncology

## 2013-05-03 NOTE — Progress Notes (Signed)
[employer] approved ondansetron 8mg  from 05/02/13-05/02/14.

## 2013-05-04 ENCOUNTER — Telehealth (INDEPENDENT_AMBULATORY_CARE_PROVIDER_SITE_OTHER): Payer: Self-pay | Admitting: General Surgery

## 2013-05-04 NOTE — Telephone Encounter (Signed)
Spoke with pt and let her know she has a po appt w/ Dr. Ezzard Standing on 06/02/13 at 9:00.

## 2013-05-05 ENCOUNTER — Encounter: Payer: Self-pay | Admitting: *Deleted

## 2013-05-05 ENCOUNTER — Other Ambulatory Visit: Payer: Medicare Other

## 2013-05-06 ENCOUNTER — Ambulatory Visit (HOSPITAL_COMMUNITY)
Admission: RE | Admit: 2013-05-06 | Discharge: 2013-05-06 | Disposition: A | Discharge status: Home / Self Care | Payer: Medicare Other | Source: Ambulatory Visit | Attending: Oncology | Admitting: Oncology

## 2013-05-06 DIAGNOSIS — I359 Nonrheumatic aortic valve disorder, unspecified: Secondary | ICD-10-CM | POA: Insufficient documentation

## 2013-05-06 DIAGNOSIS — E785 Hyperlipidemia, unspecified: Secondary | ICD-10-CM | POA: Insufficient documentation

## 2013-05-06 DIAGNOSIS — C50112 Malignant neoplasm of central portion of left female breast: Secondary | ICD-10-CM

## 2013-05-06 DIAGNOSIS — I10 Essential (primary) hypertension: Secondary | ICD-10-CM | POA: Insufficient documentation

## 2013-05-06 DIAGNOSIS — C50919 Malignant neoplasm of unspecified site of unspecified female breast: Secondary | ICD-10-CM | POA: Insufficient documentation

## 2013-05-06 DIAGNOSIS — Z8249 Family history of ischemic heart disease and other diseases of the circulatory system: Secondary | ICD-10-CM | POA: Insufficient documentation

## 2013-05-06 DIAGNOSIS — Z5111 Encounter for antineoplastic chemotherapy: Secondary | ICD-10-CM

## 2013-05-06 DIAGNOSIS — C50912 Malignant neoplasm of unspecified site of left female breast: Secondary | ICD-10-CM

## 2013-05-06 NOTE — Progress Notes (Signed)
  Echocardiogram 2D Echocardiogram has been performed.  Ellen Beltran 05/06/2013, 11:02 AM

## 2013-05-13 ENCOUNTER — Ambulatory Visit (HOSPITAL_BASED_OUTPATIENT_CLINIC_OR_DEPARTMENT_OTHER): Payer: Medicare Other | Admitting: Oncology

## 2013-05-13 ENCOUNTER — Ambulatory Visit (HOSPITAL_BASED_OUTPATIENT_CLINIC_OR_DEPARTMENT_OTHER): Payer: Medicare Other

## 2013-05-13 ENCOUNTER — Encounter: Payer: Self-pay | Admitting: Oncology

## 2013-05-13 ENCOUNTER — Other Ambulatory Visit (HOSPITAL_BASED_OUTPATIENT_CLINIC_OR_DEPARTMENT_OTHER): Payer: Medicare Other | Admitting: Lab

## 2013-05-13 VITALS — BP 142/56 | HR 58 | Temp 97.8°F | Resp 20 | Ht <= 58 in | Wt 108.6 lb

## 2013-05-13 DIAGNOSIS — C50112 Malignant neoplasm of central portion of left female breast: Secondary | ICD-10-CM

## 2013-05-13 DIAGNOSIS — C50119 Malignant neoplasm of central portion of unspecified female breast: Secondary | ICD-10-CM

## 2013-05-13 DIAGNOSIS — C50912 Malignant neoplasm of unspecified site of left female breast: Secondary | ICD-10-CM

## 2013-05-13 DIAGNOSIS — Z171 Estrogen receptor negative status [ER-]: Secondary | ICD-10-CM

## 2013-05-13 DIAGNOSIS — Z5111 Encounter for antineoplastic chemotherapy: Secondary | ICD-10-CM

## 2013-05-13 LAB — CBC WITH DIFFERENTIAL/PLATELET
BASO%: 0.7 % (ref 0.0–2.0)
Eosinophils Absolute: 0.1 10*3/uL (ref 0.0–0.5)
LYMPH%: 30.8 % (ref 14.0–49.7)
MCHC: 33.6 g/dL (ref 31.5–36.0)
MCV: 89.5 fL (ref 79.5–101.0)
MONO%: 8.6 % (ref 0.0–14.0)
Platelets: 333 10*3/uL (ref 145–400)
RBC: 3.93 10*6/uL (ref 3.70–5.45)

## 2013-05-13 LAB — COMPREHENSIVE METABOLIC PANEL (CC13)
Alkaline Phosphatase: 72 U/L (ref 40–150)
Creatinine: 0.8 mg/dL (ref 0.6–1.1)
Glucose: 96 mg/dl (ref 70–140)
Sodium: 141 mEq/L (ref 136–145)
Total Bilirubin: 0.24 mg/dL (ref 0.20–1.20)
Total Protein: 7.5 g/dL (ref 6.4–8.3)

## 2013-05-13 MED ORDER — SODIUM CHLORIDE 0.9 % IJ SOLN
10.0000 mL | INTRAMUSCULAR | Status: DC | PRN
  Administered 2013-05-13: 10 mL
  Filled 2013-05-13: qty 10

## 2013-05-13 MED ORDER — SODIUM CHLORIDE 0.9 % IV SOLN
150.0000 mg | Freq: Once | INTRAVENOUS | Status: AC
  Administered 2013-05-13: 150 mg via INTRAVENOUS
  Filled 2013-05-13: qty 5

## 2013-05-13 MED ORDER — HEPARIN SOD (PORK) LOCK FLUSH 100 UNIT/ML IV SOLN
500.0000 [IU] | Freq: Once | INTRAVENOUS | Status: AC | PRN
  Administered 2013-05-13: 500 [IU]
  Filled 2013-05-13: qty 5

## 2013-05-13 MED ORDER — DEXAMETHASONE SODIUM PHOSPHATE 20 MG/5ML IJ SOLN
12.0000 mg | Freq: Once | INTRAMUSCULAR | Status: AC
  Administered 2013-05-13: 12 mg via INTRAVENOUS

## 2013-05-13 MED ORDER — SODIUM CHLORIDE 0.9 % IV SOLN
600.0000 mg/m2 | Freq: Once | INTRAVENOUS | Status: AC
  Administered 2013-05-13: 860 mg via INTRAVENOUS
  Filled 2013-05-13: qty 43

## 2013-05-13 MED ORDER — DOXORUBICIN HCL CHEMO IV INJECTION 2 MG/ML
60.0000 mg/m2 | Freq: Once | INTRAVENOUS | Status: AC
  Administered 2013-05-13: 86 mg via INTRAVENOUS
  Filled 2013-05-13: qty 43

## 2013-05-13 MED ORDER — SODIUM CHLORIDE 0.9 % IV SOLN
Freq: Once | INTRAVENOUS | Status: AC
  Administered 2013-05-13: 14:00:00 via INTRAVENOUS

## 2013-05-13 MED ORDER — PALONOSETRON HCL INJECTION 0.25 MG/5ML
0.2500 mg | Freq: Once | INTRAVENOUS | Status: AC
  Administered 2013-05-13: 0.25 mg via INTRAVENOUS

## 2013-05-13 NOTE — Patient Instructions (Signed)
[facility] Discharge Instructions for Patients Receiving Chemotherapy  Today you received the following chemotherapy agents Adriamycin, Cytoxan  To help prevent nausea and vomiting after your treatment, we encourage you to take your nausea medication Zofran   If you develop nausea and vomiting that is not controlled by your nausea medication, call the clinic.   BELOW ARE SYMPTOMS THAT SHOULD BE REPORTED IMMEDIATELY:  *FEVER GREATER THAN 100.5 F  *CHILLS WITH OR WITHOUT FEVER  NAUSEA AND VOMITING THAT IS NOT CONTROLLED WITH YOUR NAUSEA MEDICATION  *UNUSUAL SHORTNESS OF BREATH  *UNUSUAL BRUISING OR BLEEDING  TENDERNESS IN MOUTH AND THROAT WITH OR WITHOUT PRESENCE OF ULCERS  *URINARY PROBLEMS  *BOWEL PROBLEMS  UNUSUAL RASH Items with * indicate a potential emergency and should be followed up as soon as possible.  Feel free to call the clinic you have any questions or concerns. The clinic phone number is [phone].  Doxorubicin injection What is this medicine? DOXORUBICIN (dox oh ROO bi sin) is a chemotherapy drug. It is used to treat many kinds of cancer like Hodgkin's disease, leukemia, non-Hodgkin's lymphoma, neuroblastoma, sarcoma, and Wilms' tumor. It is also used to treat bladder cancer, breast cancer, lung cancer, ovarian cancer, stomach cancer, and thyroid cancer. This medicine may be used for other purposes; ask your health care provider or pharmacist if you have questions. What should I tell my health care provider before I take this medicine? They need to know if you have any of these conditions: -blood disorders -heart disease, recent heart attack -infection (especially a virus infection such as chickenpox, cold sores, or herpes) -irregular heartbeat -liver disease -recent or ongoing radiation therapy -an unusual or allergic reaction to doxorubicin, other chemotherapy agents, other medicines, foods, dyes, or preservatives -pregnant or trying to  get pregnant -breast-feeding How should I use this medicine? This drug is given as an infusion into a vein. It is administered in a hospital or clinic by a specially trained health care professional. If you have pain, swelling, burning or any unusual feeling around the site of your injection, tell your health care professional right away. Talk to your pediatrician regarding the use of this medicine in children. Special care may be needed. Overdosage: If you think you have taken too much of this medicine contact a poison control center or emergency room at once. NOTE: This medicine is only for you. Do not share this medicine with others. What if I miss a dose? It is important not to miss your dose. Call your doctor or health care professional if you are unable to keep an appointment. What may interact with this medicine? Do not take this medicine with any of the following medications: -cisapride -droperidol -halofantrine -pimozide -zidovudine This medicine may also interact with the following medications: -chloroquine -chlorpromazine -clarithromycin -cyclophosphamide -cyclosporine -erythromycin -medicines for depression, anxiety, or psychotic disturbances -medicines for irregular heart beat like amiodarone, bepridil, dofetilide, encainide, flecainide, propafenone, quinidine -medicines for seizures like ethotoin, fosphenytoin, phenytoin -medicines for nausea, vomiting like dolasetron, ondansetron, palonosetron -medicines to increase blood counts like filgrastim, pegfilgrastim, sargramostim -methadone -methotrexate -pentamidine -progesterone -vaccines -verapamil Talk to your doctor or health care professional before taking any of these medicines: -acetaminophen -aspirin -ibuprofen -ketoprofen -naproxen This list may not describe all possible interactions. Give your health care provider a list of all the medicines, herbs, non-prescription drugs, or dietary supplements you use.  Also tell them if you smoke, drink alcohol, or use illegal drugs. Some items may interact with your medicine. What  should I watch for while using this medicine? Your condition will be monitored carefully while you are receiving this medicine. You will need important blood work done while you are taking this medicine. This drug may make you feel generally unwell. This is not uncommon, as chemotherapy can affect healthy cells as well as cancer cells. Report any side effects. Continue your course of treatment even though you feel ill unless your doctor tells you to stop. Your urine may turn red for a few days after your dose. This is not blood. If your urine is dark or brown, call your doctor. In some cases, you may be given additional medicines to help with side effects. Follow all directions for their use. Call your doctor or health care professional for advice if you get a fever, chills or sore throat, or other symptoms of a cold or flu. Do not treat yourself. This drug decreases your body's ability to fight infections. Try to avoid being around people who are sick. This medicine may increase your risk to bruise or bleed. Call your doctor or health care professional if you notice any unusual bleeding. Be careful brushing and flossing your teeth or using a toothpick because you may get an infection or bleed more easily. If you have any dental work done, tell your dentist you are receiving this medicine. Avoid taking products that contain aspirin, acetaminophen, ibuprofen, naproxen, or ketoprofen unless instructed by your doctor. These medicines may hide a fever. Men and women of childbearing age should use effective birth control methods while using taking this medicine. Do not become pregnant while taking this medicine. There is a potential for serious side effects to an unborn child. Talk to your health care professional or pharmacist for more information. Do not breast-feed an infant while taking this  medicine. Do not let others touch your urine or other body fluids for 5 days after each treatment with this medicine. Caregivers should wear latex gloves to avoid touching body fluids during this time. What side effects may I notice from receiving this medicine? Side effects that you should report to your doctor or health care professional as soon as possible: -allergic reactions like skin rash, itching or hives, swelling of the face, lips, or tongue -low blood counts - this medicine may decrease the number of white blood cells, red blood cells and platelets. You may be at increased risk for infections and bleeding. -signs of infection - fever or chills, cough, sore throat, pain or difficulty passing urine -signs of decreased platelets or bleeding - bruising, pinpoint red spots on the skin, black, tarry stools, blood in the urine -signs of decreased red blood cells - unusually weak or tired, fainting spells, lightheadedness -breathing problems -chest pain -fast, irregular heartbeat -mouth sores -nausea, vomiting -pain, swelling, redness at site where injected -pain, tingling, numbness in the hands or feet -swelling of ankles, feet, or hands -unusual bleeding or bruising Side effects that usually do not require medical attention (report to your doctor or health care professional if they continue or are bothersome): -diarrhea -facial flushing -hair loss -loss of appetite -missed menstrual periods -nail discoloration or damage -red or watery eyes -red colored urine -stomach upset This list may not describe all possible side effects. Call your doctor for medical advice about side effects. You may report side effects to FDA at 1-800-FDA-1088. Where should I keep my medicine? This drug is given in a hospital or clinic and will not be stored at home. NOTE: This sheet is  a summary. It may not cover all possible information. If you have questions about this medicine, talk to your doctor,  pharmacist, or health care provider.  2013, [employer] Standard. (12/21/2007 5:07:32 PM)   Cyclophosphamide injection What is this medicine? CYCLOPHOSPHAMIDE (sye kloe FOSS fa mide) is a chemotherapy drug. It slows the growth of cancer cells. This medicine is used to treat many types of cancer like lymphoma, myeloma, leukemia, breast cancer, and ovarian cancer, to name a few. It is also used to treat nephrotic syndrome in children. This medicine may be used for other purposes; ask your health care provider or pharmacist if you have questions. What should I tell my health care provider before I take this medicine? They need to know if you have any of these conditions: -blood disorders -history of other chemotherapy -history of radiation therapy -infection -kidney disease -liver disease -tumors in the bone marrow -an unusual or allergic reaction to cyclophosphamide, other chemotherapy, other medicines, foods, dyes, or preservatives -pregnant or trying to get pregnant -breast-feeding How should I use this medicine? This drug is usually given as an injection into a vein or muscle or by infusion into a vein. It is administered in a hospital or clinic by a specially trained health care professional. Talk to your pediatrician regarding the use of this medicine in children. While this drug may be prescribed for selected conditions, precautions do apply. Overdosage: If you think you have taken too much of this medicine contact a poison control center or emergency room at once. NOTE: This medicine is only for you. Do not share this medicine with others. What if I miss a dose? It is important not to miss your dose. Call your doctor or health care professional if you are unable to keep an appointment. What may interact with this medicine? Do not take this medicine with any of the following medications: -mibefradil -nalidixic acid This medicine may also interact with the following  medications: -doxorubicin -etanercept -medicines to increase blood counts like filgrastim, pegfilgrastim, sargramostim -medicines that block muscle or nerve pain -St. John's Wort -phenobarbital -succinylcholine chloride -trastuzumab -vaccines Talk to your doctor or health care professional before taking any of these medicines: -acetaminophen -aspirin -ibuprofen -ketoprofen -naproxen This list may not describe all possible interactions. Give your health care provider a list of all the medicines, herbs, non-prescription drugs, or dietary supplements you use. Also tell them if you smoke, drink alcohol, or use illegal drugs. Some items may interact with your medicine. What should I watch for while using this medicine? Visit your doctor for checks on your progress. This drug may make you feel generally unwell. This is not uncommon, as chemotherapy can affect healthy cells as well as cancer cells. Report any side effects. Continue your course of treatment even though you feel ill unless your doctor tells you to stop. Drink water or other fluids as directed. Urinate often, even at night. In some cases, you may be given additional medicines to help with side effects. Follow all directions for their use. Call your doctor or health care professional for advice if you get a fever, chills or sore throat, or other symptoms of a cold or flu. Do not treat yourself. This drug decreases your body's ability to fight infections. Try to avoid being around people who are sick. This medicine may increase your risk to bruise or bleed. Call your doctor or health care professional if you notice any unusual bleeding. Be careful brushing and flossing your teeth or using a  toothpick because you may get an infection or bleed more easily. If you have any dental work done, tell your dentist you are receiving this medicine. Avoid taking products that contain aspirin, acetaminophen, ibuprofen, naproxen, or ketoprofen unless  instructed by your doctor. These medicines may hide a fever. Do not become pregnant while taking this medicine. Women should inform their doctor if they wish to become pregnant or think they might be pregnant. There is a potential for serious side effects to an unborn child. Talk to your health care professional or pharmacist for more information. Do not breast-feed an infant while taking this medicine. Men should inform their doctor if they wish to father a child. This medicine may lower sperm counts. If you are going to have surgery, tell your doctor or health care professional that you have taken this medicine. What side effects may I notice from receiving this medicine? Side effects that you should report to your doctor or health care professional as soon as possible: -allergic reactions like skin rash, itching or hives, swelling of the face, lips, or tongue -low blood counts - this medicine may decrease the number of white blood cells, red blood cells and platelets. You may be at increased risk for infections and bleeding. -signs of infection - fever or chills, cough, sore throat, pain or difficulty passing urine -signs of decreased platelets or bleeding - bruising, pinpoint red spots on the skin, black, tarry stools, blood in the urine -signs of decreased red blood cells - unusually weak or tired, fainting spells, lightheadedness -breathing problems -dark urine -mouth sores -pain, swelling, redness at site where injected -swelling of the ankles, feet, hands -trouble passing urine or change in the amount of urine -weight gain -yellowing of the eyes or skin Side effects that usually do not require medical attention (report to your doctor or health care professional if they continue or are bothersome): -changes in nail or skin color -diarrhea -hair loss -loss of appetite -missed menstrual periods -nausea, vomiting -stomach pain This list may not describe all possible side effects. Call  your doctor for medical advice about side effects. You may report side effects to FDA at 1-800-FDA-1088. Where should I keep my medicine? This drug is given in a hospital or clinic and will not be stored at home. NOTE: This sheet is a summary. It may not cover all possible information. If you have questions about this medicine, talk to your doctor, pharmacist, or health care provider.  2012, [employer]vier/Gold Standard. (12/07/2007 2:32:25 PM)

## 2013-05-13 NOTE — Patient Instructions (Addendum)
Proceed with AC today  We will see you back in 1 week for follow up

## 2013-05-14 ENCOUNTER — Ambulatory Visit (HOSPITAL_BASED_OUTPATIENT_CLINIC_OR_DEPARTMENT_OTHER): Payer: Medicare Other

## 2013-05-14 VITALS — BP 118/43 | HR 86 | Temp 97.6°F

## 2013-05-14 DIAGNOSIS — C50112 Malignant neoplasm of central portion of left female breast: Secondary | ICD-10-CM

## 2013-05-14 DIAGNOSIS — C50919 Malignant neoplasm of unspecified site of unspecified female breast: Secondary | ICD-10-CM

## 2013-05-14 MED ORDER — PEGFILGRASTIM INJECTION 6 MG/0.6ML
6.0000 mg | Freq: Once | SUBCUTANEOUS | Status: AC
  Administered 2013-05-14: 6 mg via SUBCUTANEOUS

## 2013-05-19 ENCOUNTER — Inpatient Hospital Stay (HOSPITAL_COMMUNITY)
Admission: EM | Admit: 2013-05-19 | Discharge: 2013-05-23 | DRG: 809 | Disposition: A | Discharge status: Home / Self Care | Payer: Medicare Other | Attending: Internal Medicine | Admitting: Internal Medicine

## 2013-05-19 ENCOUNTER — Telehealth: Payer: Self-pay | Admitting: *Deleted

## 2013-05-19 ENCOUNTER — Encounter (HOSPITAL_COMMUNITY): Payer: Self-pay | Admitting: Emergency Medicine

## 2013-05-19 ENCOUNTER — Emergency Department (HOSPITAL_COMMUNITY): Payer: Medicare Other

## 2013-05-19 DIAGNOSIS — E861 Hypovolemia: Secondary | ICD-10-CM | POA: Diagnosis present

## 2013-05-19 DIAGNOSIS — R000 Tachycardia, unspecified: Secondary | ICD-10-CM | POA: Diagnosis present

## 2013-05-19 DIAGNOSIS — D61818 Other pancytopenia: Secondary | ICD-10-CM | POA: Diagnosis present

## 2013-05-19 DIAGNOSIS — E785 Hyperlipidemia, unspecified: Secondary | ICD-10-CM | POA: Diagnosis present

## 2013-05-19 DIAGNOSIS — E871 Hypo-osmolality and hyponatremia: Secondary | ICD-10-CM

## 2013-05-19 DIAGNOSIS — M899 Disorder of bone, unspecified: Secondary | ICD-10-CM | POA: Diagnosis present

## 2013-05-19 DIAGNOSIS — Z79899 Other long term (current) drug therapy: Secondary | ICD-10-CM

## 2013-05-19 DIAGNOSIS — I10 Essential (primary) hypertension: Secondary | ICD-10-CM | POA: Diagnosis present

## 2013-05-19 DIAGNOSIS — D709 Neutropenia, unspecified: Secondary | ICD-10-CM | POA: Diagnosis present

## 2013-05-19 DIAGNOSIS — C50919 Malignant neoplasm of unspecified site of unspecified female breast: Secondary | ICD-10-CM | POA: Diagnosis present

## 2013-05-19 DIAGNOSIS — IMO0001 Reserved for inherently not codable concepts without codable children: Secondary | ICD-10-CM | POA: Diagnosis present

## 2013-05-19 DIAGNOSIS — C50112 Malignant neoplasm of central portion of left female breast: Secondary | ICD-10-CM

## 2013-05-19 DIAGNOSIS — R5081 Fever presenting with conditions classified elsewhere: Secondary | ICD-10-CM

## 2013-05-19 DIAGNOSIS — T451X5A Adverse effect of antineoplastic and immunosuppressive drugs, initial encounter: Principal | ICD-10-CM | POA: Diagnosis present

## 2013-05-19 DIAGNOSIS — R6510 Systemic inflammatory response syndrome (SIRS) of non-infectious origin without acute organ dysfunction: Secondary | ICD-10-CM

## 2013-05-19 DIAGNOSIS — D61810 Antineoplastic chemotherapy induced pancytopenia: Principal | ICD-10-CM | POA: Diagnosis present

## 2013-05-19 DIAGNOSIS — Z9221 Personal history of antineoplastic chemotherapy: Secondary | ICD-10-CM

## 2013-05-19 LAB — URINALYSIS, ROUTINE W REFLEX MICROSCOPIC
Bilirubin Urine: NEGATIVE
Glucose, UA: NEGATIVE mg/dL
Hgb urine dipstick: NEGATIVE
Ketones, ur: NEGATIVE mg/dL
Nitrite: NEGATIVE
Protein, ur: NEGATIVE mg/dL
Specific Gravity, Urine: 1.015 (ref 1.005–1.030)
Urobilinogen, UA: 0.2 mg/dL (ref 0.0–1.0)
pH: 7 (ref 5.0–8.0)

## 2013-05-19 LAB — CBC WITH DIFFERENTIAL/PLATELET
Basophils Absolute: 0 10*3/uL (ref 0.0–0.1)
Basophils Relative: 0 % (ref 0–1)
Eosinophils Absolute: 0 10*3/uL (ref 0.0–0.7)
Eosinophils Relative: 2 % (ref 0–5)
HCT: 31.4 % — ABNORMAL LOW (ref 36.0–46.0)
Hemoglobin: 10.6 g/dL — ABNORMAL LOW (ref 12.0–15.0)
Lymphocytes Relative: 83 % — ABNORMAL HIGH (ref 12–46)
Lymphs Abs: 0.8 10*3/uL (ref 0.7–4.0)
MCH: 29.2 pg (ref 26.0–34.0)
MCHC: 33.8 g/dL (ref 30.0–36.0)
MCV: 86.5 fL (ref 78.0–100.0)
Monocytes Absolute: 0 10*3/uL — ABNORMAL LOW (ref 0.1–1.0)
Monocytes Relative: 4 % (ref 3–12)
Neutro Abs: 0.1 10*3/uL — ABNORMAL LOW (ref 1.7–7.7)
Neutrophils Relative %: 11 % — ABNORMAL LOW (ref 43–77)
Platelets: 99 10*3/uL — ABNORMAL LOW (ref 150–400)
RBC: 3.63 MIL/uL — ABNORMAL LOW (ref 3.87–5.11)
RDW: 12.7 % (ref 11.5–15.5)
WBC: 0.9 10*3/uL — CL (ref 4.0–10.5)

## 2013-05-19 LAB — COMPREHENSIVE METABOLIC PANEL
ALT: 11 U/L (ref 0–35)
AST: 8 U/L (ref 0–37)
Albumin: 3.2 g/dL — ABNORMAL LOW (ref 3.5–5.2)
Alkaline Phosphatase: 75 U/L (ref 39–117)
BUN: 17 mg/dL (ref 6–23)
CO2: 27 mEq/L (ref 19–32)
Calcium: 9.4 mg/dL (ref 8.4–10.5)
Chloride: 96 mEq/L (ref 96–112)
Creatinine, Ser: 0.74 mg/dL (ref 0.50–1.10)
GFR calc Af Amer: >90 mL/min (ref >90)
GFR calc non Af Amer: 87 mL/min — ABNORMAL LOW (ref >90)
Glucose, Bld: 130 mg/dL — ABNORMAL HIGH (ref 70–99)
Potassium: 4.2 mEq/L (ref 3.5–5.1)
Sodium: 133 mEq/L — ABNORMAL LOW (ref 135–145)
Total Bilirubin: 0.7 mg/dL (ref 0.3–1.2)
Total Protein: 6.5 g/dL (ref 6.0–8.3)

## 2013-05-19 LAB — URINE MICROSCOPIC-ADD ON

## 2013-05-19 MED ORDER — PROCHLORPERAZINE MALEATE 10 MG PO TABS
10.0000 mg | ORAL_TABLET | Freq: Four times a day (QID) | ORAL | Status: DC | PRN
  Filled 2013-05-19: qty 1

## 2013-05-19 MED ORDER — SODIUM CHLORIDE 0.9 % IV SOLN
500.0000 mg | Freq: Two times a day (BID) | INTRAVENOUS | Status: DC
  Administered 2013-05-19: 500 mg via INTRAVENOUS
  Filled 2013-05-19 (×2): qty 500

## 2013-05-19 MED ORDER — DEXTROSE 5 % IV SOLN
2.0000 g | Freq: Three times a day (TID) | INTRAVENOUS | Status: DC
  Administered 2013-05-19 – 2013-05-20 (×2): 2 g via INTRAVENOUS
  Filled 2013-05-19 (×3): qty 2

## 2013-05-19 MED ORDER — DEXTROSE-NACL 5-0.9 % IV SOLN
INTRAVENOUS | Status: DC
  Administered 2013-05-19 – 2013-05-20 (×2): via INTRAVENOUS

## 2013-05-19 MED ORDER — LISINOPRIL 10 MG PO TABS
10.0000 mg | ORAL_TABLET | Freq: Every morning | ORAL | Status: DC
  Administered 2013-05-20 – 2013-05-21 (×2): 10 mg via ORAL
  Filled 2013-05-19 (×2): qty 1

## 2013-05-19 MED ORDER — ONDANSETRON HCL 4 MG PO TABS: 4.0000 mg | ORAL_TABLET | Freq: Four times a day (QID) | ORAL | Status: DC | PRN

## 2013-05-19 MED ORDER — PIPERACILLIN-TAZOBACTAM 3.375 G IVPB
3.3750 g | Freq: Three times a day (TID) | INTRAVENOUS | Status: DC
  Filled 2013-05-19: qty 50

## 2013-05-19 MED ORDER — ONDANSETRON HCL 4 MG/2ML IJ SOLN
4.0000 mg | Freq: Four times a day (QID) | INTRAMUSCULAR | Status: DC | PRN
  Administered 2013-05-20: 4 mg via INTRAVENOUS
  Filled 2013-05-19: qty 2

## 2013-05-19 MED ORDER — LORAZEPAM 0.5 MG PO TABS: 0.5000 mg | ORAL_TABLET | Freq: Four times a day (QID) | ORAL | Status: DC | PRN

## 2013-05-19 MED ORDER — ACETAMINOPHEN 650 MG RE SUPP: 650.0000 mg | Freq: Four times a day (QID) | RECTAL | Status: DC | PRN

## 2013-05-19 MED ORDER — ONDANSETRON HCL 4 MG/2ML IJ SOLN
4.0000 mg | Freq: Once | INTRAMUSCULAR | Status: AC
  Administered 2013-05-19: 4 mg via INTRAVENOUS
  Filled 2013-05-19: qty 2

## 2013-05-19 MED ORDER — SIMVASTATIN 40 MG PO TABS
40.0000 mg | ORAL_TABLET | Freq: Every evening | ORAL | Status: DC
  Administered 2013-05-19 – 2013-05-22 (×4): 40 mg via ORAL
  Filled 2013-05-19 (×5): qty 1

## 2013-05-19 MED ORDER — ACETAMINOPHEN 325 MG PO TABS
650.0000 mg | ORAL_TABLET | Freq: Four times a day (QID) | ORAL | Status: DC | PRN
  Administered 2013-05-19: 650 mg via ORAL
  Filled 2013-05-19: qty 2

## 2013-05-19 MED ORDER — PIPERACILLIN-TAZOBACTAM 3.375 G IVPB 30 MIN
3.3750 g | Freq: Once | INTRAVENOUS | Status: DC
  Administered 2013-05-19: 3.375 g via INTRAVENOUS
  Filled 2013-05-19: qty 50

## 2013-05-19 NOTE — Progress Notes (Addendum)
ANTIBIOTIC CONSULT NOTE - INITIAL  Pharmacy Consult for Vancomycin & Cefepime, Zosyn d/c Indication: neutropenia  Allergies  Allergen Reactions  . Codeine Other (See Comments)    Sees unusual things  . Morphine And Related Nausea Only  . Sulfa Antibiotics Rash   Patient Measurements:   Total body weight: 49.3 kg  Vital Signs: Temp: 98.5 F (36.9 C) (09/04 1810) Temp src: Oral (09/04 1810) BP: 129/61 mmHg (09/04 1810) Pulse Rate: 101 (09/04 1810) Intake/Output from previous day:   Intake/Output from this shift:    Labs:  Recent Labs  05/19/13 1856  WBC 0.9*  HGB 10.6*  PLT 99*  CREATININE 0.74   The CrCl is unknown because both a height and weight (above a minimum accepted value) are required for this calculation. No results found for this basename: VANCOTROUGH, VANCOPEAK, VANCORANDOM, GENTTROUGH, GENTPEAK, GENTRANDOM, TOBRATROUGH, TOBRAPEAK, TOBRARND, AMIKACINPEAK, AMIKACINTROU, AMIKACIN,  in the last 72 hours   Microbiology: No results found for this or any previous visit (from the past 720 hour(s)).  Medical History: Past Medical History  Diagnosis Date  . Hypertension   . Hyperlipidemia   . Osteopenia   . Arthritis   . Hot flashes   . Cancer     left breast   Medications:  Anti-infectives   Start     Dose/Rate Route Frequency Ordered Stop   05/20/13 0400  piperacillin-tazobactam (ZOSYN) IVPB 3.375 g     3.375 g 12.5 mL/hr over 240 Minutes Intravenous Every 8 hours 05/19/13 2007     05/19/13 2030  piperacillin-tazobactam (ZOSYN) IVPB 3.375 g     3.375 g 100 mL/hr over 30 Minutes Intravenous  Once 05/19/13 2006       Assessment: 66 yoF undergoing chemo for Breast Ca; D1C1 Doxorubicin/Cyclophosphamide completed 8/29, D1D2 scheduled for 9/12. Noted fever of 100.6 at home, afebrile now. Complaint of sore throat.  WBC 0.9K, ANC 100  Renal function wnl  Goal of Therapy:  Abx dose/schedule appropriate for renal function, treatment of  infection  Plan:   Zosyn ordered to begin in ED  Suggest Vancomycin/Cefepime instead of Zosyn for febrile neutropenia if patient admitted  Otho Bellows PharmD Pager [phone] 05/19/2013, 8:34 PM  Addendum:Discontinue Zosyn, Begin Vancomycin and Cefepime for febrile neutropenia  Begin Vancomycin 500mg  q12            Cefepime 2gm q8h  Monitor cultures, renal function.  Otho Bellows PharmD Pager [phone] 05/19/2013, 10:18 PM

## 2013-05-19 NOTE — H&P (Addendum)
[facility] History and Physical  MYRACLE FEBRES ZOX:[id number] DOB: Sep 21, 1945 DOA: 05/19/2013  Referring physician: Dr. Juleen China PCP: [name], MD  Specialists: Dr. Welton Flakes  Chief Complaint: Chills  HPI: Ellen Beltran is a 67 y.o. female  With history of recently diagnosed breast cancer (invasive ductal carcinoma with papillary features) followed by Dr. Welton Flakes.  Had her first Chemotherapy last Friday 8/29.  She states that she has had 2 days of chills.  Reportedly she took her own temperature orally and had a temperature of 100.6.  Patient decided to come to the ed for further evaluation.  She also endorses BL leg cramps and poor oral intake. She denies any cough, SOB, sputum production, dysuria, new rashes.    In the ED blood cultures, chest x ray, U/A were ordered and we were consulted for admission evaluation and recommendations.  Review of Systems: 10 point review of systems reviewed and negative unless otherwise mentioned above.   Past Medical History  Diagnosis Date  . Hypertension   . Hyperlipidemia   . Osteopenia   . Arthritis   . Hot flashes   . Cancer     left breast   Past Surgical History  Procedure Laterality Date  . Cardiac catheterization  2004  . Tubal ligation  age 32  . Total abdominal hysterectomy  age 50 or 6  . Breast biopsy Left aug 2014  . Portacath placement N/A 05/02/2013    Procedure: INSERTION PORT-A-CATH;  Surgeon: Kandis Cocking, MD;  Location: [facility];  Service: General;  Laterality: N/A;   Social History:  reports that she has never smoked. She has never used smokeless tobacco. She reports that she does not drink alcohol or use illicit drugs. Lives at home with husband  Can patient participate in ADLs? yes  Allergies  Allergen Reactions  . Codeine Other (See Comments)    Sees unusual things  . Morphine And Related Nausea Only  . Sulfa Antibiotics Rash    Family History  Problem Relation Age of Onset  . Heart disease Father    . Heart disease Brother   . Lung cancer Mother     Prior to Admission medications   Medication Sig Start Date End Date Taking? Authorizing Provider  lidocaine-prilocaine (EMLA) cream Apply topically as needed. 04/27/13  Yes Victorino December, MD  lisinopril (PRINIVIL,ZESTRIL) 10 MG tablet Take 10 mg by mouth every morning.    Yes Historical Provider, MD  LORazepam (ATIVAN) 0.5 MG tablet Take 1 tablet (0.5 mg total) by mouth every 6 (six) hours as needed (Nausea or vomiting). 04/27/13  Yes Victorino December, MD  ondansetron (ZOFRAN) 8 MG tablet Take 1 tablet (8 mg total) by mouth 2 (two) times daily as needed. Take two times a day as needed for nausea or vomiting starting on the third day after chemotherapy. 04/27/13  Yes Victorino December, MD  PRESCRIPTION MEDICATION Chemotherapy at [facility].   Yes Historical Provider, MD  prochlorperazine (COMPAZINE) 10 MG tablet Take 1 tablet (10 mg total) by mouth every 6 (six) hours as needed (Nausea or vomiting). 04/27/13  Yes Victorino December, MD  simvastatin (ZOCOR) 40 MG tablet Take 40 mg by mouth every evening.   Yes Historical Provider, MD   Physical Exam: Filed Vitals:   05/19/13 1810  BP: 129/61  Pulse: 101  Temp: 98.5 F (36.9 C)  Resp: 22     General:  Pt in NAD, Alert and Awake  Eyes: EOMI, non icteric  ENT: normal exterior appearance, no masses on visual examination  Neck: supple, no goiter  Cardiovascular: RRR, no MRG  Respiratory: CTA BL, no wheezes  Abdomen: soft, NT, ND  Skin: warm and dry  Musculoskeletal: no cyanosis or clubbing, BL leg discomfort with palpation, no underlying rashes visible on visual examination  Psychiatric: mood and affect appropriate  Neurologic: answers questions appropriately and moves all extremities equally.  Labs on Admission:  Basic Metabolic Panel:  Recent Labs Lab 05/13/13 1136 05/19/13 1856  NA 141 133*  K 4.2 4.2  CL  --  96  CO2 25 27  GLUCOSE 96 130*  BUN 14.3 17  CREATININE 0.8 0.74   CALCIUM 9.8 9.4   Liver Function Tests:  Recent Labs Lab 05/13/13 1136 05/19/13 1856  AST 15 8  ALT 17 11  ALKPHOS 72 75  BILITOT 0.24 0.7  PROT 7.5 6.5  ALBUMIN 4.0 3.2*   No results found for this basename: LIPASE, AMYLASE,  in the last 168 hours No results found for this basename: AMMONIA,  in the last 168 hours CBC:  Recent Labs Lab 05/13/13 1136 05/19/13 1856  WBC 11.0* 0.9*  NEUTROABS 6.4 0.1*  HGB 11.8 10.6*  HCT 35.2 31.4*  MCV 89.5 86.5  PLT 333 99*   Cardiac Enzymes: No results found for this basename: CKTOTAL, CKMB, CKMBINDEX, TROPONINI,  in the last 168 hours  BNP (last 3 results) No results found for this basename: PROBNP,  in the last 8760 hours CBG: No results found for this basename: GLUCAP,  in the last 168 hours  Radiological Exams on Admission: Dg Chest 2 View  05/19/2013   *RADIOLOGY REPORT*  Clinical Data: Neutropenic fever.  CHEST - 2 VIEW  Comparison: May 02, 2013  Findings: There is no focal infiltrate, pulmonary edema, or pleural effusion.  The mediastinal contour and cardiac silhouette are stable.  Right central venous line is unchanged.  The soft tissues and osseous structures are stable.  IMPRESSION: No focal pneumonia.   Original Report Authenticated By: Sherian Rein, M.D.    Assessment/Plan Principal Problem:   Neutropenic fever Active Problems:   Pancytopenia   Hyponatremia   1. Neutropenic Fevers/Sirs Criteria - At this point source is uncertain.  Chest x ray and U/A pending  - Will place on broad spectrum antibiotics Levaquin and Vancomycin - Monitor on telemetry - follow up with blood cultures/urine cultures (  2. Pancytopenia - most likely due to recent chemotherapy - consider consulting oncologist Dr. Welton Flakes next am - reassess cbc next am.  3. Hyponatremia - Will place on MIVF with D5 Normal saline - reassess next am.   Code Status: full Family Communication: discussed with patient and husband at bedside.   Disposition Plan: Pending further work up and imaging study results. Will place in telemetry  Time spent:   Penny Pia [facility] Pager [phone]  If 7PM-7AM, please contact night-coverage [email or web address] Password [facility] 05/19/2013, 9:02 PM      Addendum after discussion with pharmacy patient placed on cefepime and vancomycin initially.

## 2013-05-19 NOTE — ED Notes (Signed)
Per RN request pt will be transported in 10 minutes to unit.

## 2013-05-19 NOTE — ED Notes (Signed)
Dr Juleen China made aware of critical wbc

## 2013-05-19 NOTE — ED Provider Notes (Signed)
CSN: [id number]     Arrival date & time 05/19/13  1759 History   First MD Initiated Contact with Patient 05/19/13 1943     Chief Complaint  Patient presents with  . Leg Pain  . Arm Pain  . Fever   (Consider location/radiation/quality/duration/timing/severity/associated sxs/prior Treatment) HPI  66yF with fever and general malaise. Hx of breast CA and just started chemo last week. Over the past few days feeling very run down. Fever to 100.9. Body aches, particularly legs. Mild ha. No neck stiffness. No confusion per husband. No sob. No cough. No urinary complaints. +n/v. No diarrhea. No sick contacts.   Past Medical History  Diagnosis Date  . Hypertension   . Hyperlipidemia   . Osteopenia   . Arthritis   . Hot flashes   . Cancer     left breast   Past Surgical History  Procedure Laterality Date  . Cardiac catheterization  2004  . Tubal ligation  age 28  . Total abdominal hysterectomy  age 77 or 49  . Breast biopsy Left aug 2014  . Portacath placement N/A 05/02/2013    Procedure: INSERTION PORT-A-CATH;  Surgeon: Kandis Cocking, MD;  Location: [facility];  Service: General;  Laterality: N/A;   Family History  Problem Relation Age of Onset  . Heart disease Father   . Heart disease Brother   . Lung cancer Mother    History  Substance Use Topics  . Smoking status: Never Smoker   . Smokeless tobacco: Never Used  . Alcohol Use: No   OB History   Grav Para Term Preterm Abortions TAB SAB Ect Mult Living                 Review of Systems  All systems reviewed and negative, other than as noted in HPI.   Allergies  Codeine; Morphine and related; and Sulfa antibiotics  Home Medications   Current Outpatient Rx  Name  Route  Sig  Dispense  Refill  . lidocaine-prilocaine (EMLA) cream   Topical   Apply topically as needed.   30 g   6   . lisinopril (PRINIVIL,ZESTRIL) 10 MG tablet   Oral   Take 10 mg by mouth every morning.          Marland Kitchen LORazepam (ATIVAN) 0.5 MG  tablet   Oral   Take 1 tablet (0.5 mg total) by mouth every 6 (six) hours as needed (Nausea or vomiting).   30 tablet   0   . ondansetron (ZOFRAN) 8 MG tablet   Oral   Take 1 tablet (8 mg total) by mouth 2 (two) times daily as needed. Take two times a day as needed for nausea or vomiting starting on the third day after chemotherapy.   30 tablet   1   . PRESCRIPTION MEDICATION      Chemotherapy at [facility].         Marland Kitchen prochlorperazine (COMPAZINE) 10 MG tablet   Oral   Take 1 tablet (10 mg total) by mouth every 6 (six) hours as needed (Nausea or vomiting).   30 tablet   1   . simvastatin (ZOCOR) 40 MG tablet   Oral   Take 40 mg by mouth every evening.          BP 129/61  Pulse 101  Temp(Src) 98.5 F (36.9 C) (Oral)  Resp 22  SpO2 99% Physical Exam  Nursing note and vitals reviewed. Constitutional: She appears well-developed and well-nourished. No distress.  Laying  in bed. Tired appearing but not distressed.   HENT:  Head: Normocephalic and atraumatic.  Eyes: Conjunctivae are normal. Right eye exhibits no discharge. Left eye exhibits no discharge.  Neck: Neck supple.  No nuchal rigidity.   Cardiovascular: Normal rate, regular rhythm and normal heart sounds.  Exam reveals no gallop and no friction rub.   No murmur heard. Pulmonary/Chest: Effort normal and breath sounds normal. No respiratory distress.  Port R chest. No concerning surrounding skin changes.   Abdominal: Soft. She exhibits no distension. There is no tenderness.  Musculoskeletal: She exhibits no edema and no tenderness.  Neurological: She is alert.  Skin: Skin is warm and dry.  Psychiatric: Her behavior is normal. Thought content normal.    ED Course  Procedures (including critical care time) Labs Review Labs Reviewed  CBC WITH DIFFERENTIAL - Abnormal; Notable for the following:    WBC 0.9 (*)    RBC 3.63 (*)    Hemoglobin 10.6 (*)    HCT 31.4 (*)    Platelets 99 (*)    Neutrophils Relative % 11  (*)    Lymphocytes Relative 83 (*)    Neutro Abs 0.1 (*)    Monocytes Absolute 0.0 (*)    All other components within normal limits  COMPREHENSIVE METABOLIC PANEL - Abnormal; Notable for the following:    Sodium 133 (*)    Glucose, Bld 130 (*)    Albumin 3.2 (*)    GFR calc non Af Amer 87 (*)    All other components within normal limits  URINE CULTURE  CULTURE, BLOOD (ROUTINE X 2)  CULTURE, BLOOD (ROUTINE X 2)  URINALYSIS, ROUTINE W REFLEX MICROSCOPIC  PATHOLOGIST SMEAR REVIEW  POCT LACTIC ACID (LACTATE)   Imaging Review Dg Chest 2 View  05/19/2013   *RADIOLOGY REPORT*  Clinical Data: Neutropenic fever.  CHEST - 2 VIEW  Comparison: May 02, 2013  Findings: There is no focal infiltrate, pulmonary edema, or pleural effusion.  The mediastinal contour and cardiac silhouette are stable.  Right central venous line is unchanged.  The soft tissues and osseous structures are stable.  IMPRESSION: No focal pneumonia.   Original Report Authenticated By: Sherian Rein, M.D.    MDM   1. Neutropenic fever   2. Hyponatremia   3. Pancytopenia   4. SIRS (systemic inflammatory response syndrome)      66yF with neutropenic fever. Empiric abx. Infectious w/u. Admit.     Raeford Razor, MD 05/20/13 [phone]

## 2013-05-19 NOTE — Telephone Encounter (Signed)
Received call from pt's granddaughter, Ellen Beltran reporting that pt ran fever of 100.9 today with no other symptoms other than sore legs & reports that legs are hard to pick up & down & were restless during the night.  She has taken some tylenol.  Reported to Augustin Schooling NP  @ 4:30 pm & she suggested pt go to the ED since it was late to make sure she wasn't neutropenic.  Informed pt & she really didn't want to go to ED but encouraged pt to do so.  She reports that she can hardly walk due to soreness & may have to wait for husband to come home @ 6 pm.

## 2013-05-19 NOTE — ED Notes (Signed)
Pt states that she began having bilateral lower leg cramping and R forearm cramping last night. Pt states that she has also had a fever of 100.27F at home. Pt began first chemotherapy tx on Friday. No v/d, cough or burning with urination. Pt states she had a sore throat yesterday that has lessened today.

## 2013-05-20 ENCOUNTER — Other Ambulatory Visit: Payer: Medicare Other | Admitting: Lab

## 2013-05-20 ENCOUNTER — Ambulatory Visit: Payer: Medicare Other | Admitting: Adult Health

## 2013-05-20 ENCOUNTER — Encounter (HOSPITAL_COMMUNITY): Payer: Self-pay | Admitting: General Practice

## 2013-05-20 DIAGNOSIS — M79609 Pain in unspecified limb: Secondary | ICD-10-CM

## 2013-05-20 LAB — CBC WITH DIFFERENTIAL/PLATELET
Basophils Absolute: 0 10*3/uL (ref 0.0–0.1)
Eosinophils Absolute: 0.1 10*3/uL (ref 0.0–0.7)
Lymphocytes Relative: 82 % — ABNORMAL HIGH (ref 12–46)
MCHC: 33.9 g/dL (ref 30.0–36.0)
Neutrophils Relative %: 4 % — ABNORMAL LOW (ref 43–77)
RDW: 12.8 % (ref 11.5–15.5)
WBC: 1.3 10*3/uL — CL (ref 4.0–10.5)

## 2013-05-20 LAB — BASIC METABOLIC PANEL
BUN: 16 mg/dL (ref 6–23)
Creatinine, Ser: 0.79 mg/dL (ref 0.50–1.10)
GFR calc Af Amer: >90 mL/min (ref >90)
GFR calc non Af Amer: 86 mL/min — ABNORMAL LOW (ref >90)
Potassium: 3.9 mEq/L (ref 3.5–5.1)

## 2013-05-20 MED ORDER — VANCOMYCIN HCL 500 MG IV SOLR
500.0000 mg | Freq: Once | INTRAVENOUS | Status: AC
  Administered 2013-05-20: 500 mg via INTRAVENOUS
  Filled 2013-05-20: qty 500

## 2013-05-20 MED ORDER — VANCOMYCIN HCL IN DEXTROSE 1-5 GM/200ML-% IV SOLN
1000.0000 mg | INTRAVENOUS | Status: DC
  Administered 2013-05-20 – 2013-05-21 (×2): 1000 mg via INTRAVENOUS
  Filled 2013-05-20 (×3): qty 200

## 2013-05-20 MED ORDER — SODIUM CHLORIDE 0.9 % IJ SOLN
10.0000 mL | INTRAMUSCULAR | Status: DC | PRN
  Administered 2013-05-21: 20 mL

## 2013-05-20 MED ORDER — DEXTROSE 5 % IV SOLN
2.0000 g | Freq: Two times a day (BID) | INTRAVENOUS | Status: DC
  Administered 2013-05-20 – 2013-05-22 (×4): 2 g via INTRAVENOUS
  Filled 2013-05-20 (×5): qty 2

## 2013-05-20 NOTE — Progress Notes (Signed)
ANTIBIOTIC CONSULT NOTE - Follow up  Pharmacy Consult for Vancomycin & Cefepime Indication: neutropenia  Allergies  Allergen Reactions  . Codeine Other (See Comments)    Sees unusual things  . Morphine And Related Nausea Only  . Sulfa Antibiotics Rash   Patient Measurements: Height: 4\' 10"  (147.3 cm) Weight: 108 lb 0.4 oz (49 kg) IBW/kg (Calculated) : 40.9  Vital Signs: Temp: 98.4 F (36.9 C) (09/05 0522) Temp src: Oral (09/05 0522) BP: 100/58 mmHg (09/05 0522) Pulse Rate: 70 (09/05 0522) Intake/Output from previous day: 09/04 0701 - 09/05 0700 In: 1401.7 [P.O.:480; I.V.:721.7; IV Piggyback:200] Out: 600 [Urine:600]  Labs:  Recent Labs  05/19/13 1856 05/20/13 0540  WBC 0.9* 1.3*  HGB 10.6* 9.8*  PLT 99* 87*  CREATININE 0.74 0.79   Estimated Creatinine Clearance: 45.3 ml/min (by C-G formula based on Cr of 0.79).  Microbiology: No results found for this or any previous visit (from the past 720 hour(s)).  Anti-infectives:    9/4 >> Zosyn >> 9/4 9/4 >> Vanc >> 9/4 >> Cefepime >>  Assessment: 66 yoF undergoing chemo for Breast Ca; D1C1 Doxorubicin/Cyclophosphamide completed 8/29, D1D2 scheduled for 9/12. Noted fever of 100.6 at home and presented to ED.  Afebrile on admission but s/o sore throat.  Pharmacy asked to dose vancomycin and cefepime for neutropenic fever.  Day #2 Vancomycin and Cefepime.  WBC increased (0.9> 1.3) and ANC (0.1)  Renal function wnl.  SCr 0.79 with CrCl ~ 45 ml/min (CG)  Goal of Therapy:  Vancomycin trough level 15-20 mcg/ml Appropriate abx dosing, eradication of infection.   Plan:   Cefepime 2g IV q12h Vancomycin 1g IV q24h Measure Vanc trough at steady state. Follow up renal fxn and culture results.  Lynann Beaver PharmD, BCPS Pager [phone] 05/20/2013 1:11 PM

## 2013-05-20 NOTE — Progress Notes (Signed)
[facility] PROGRESS NOTE  Ellen Beltran ZOX:[id number] DOB: 1945/12/02 DOA: 05/19/2013 PCP: [name], MD  Assessment/Plan: Febrile Neutropenia -ANC 52 -Patient has remained afebrile since admission -Blood cultures are negative to date -Patient received Neulasta on the day after her chemotherapy -Chest x-ray negative for infiltrates--patient without any pulmonary symptoms -Port-A-Cath placed 05/02/2013 -Dr. Welton Flakes notified of patient's admission Pancytopenia -Due to chemotherapy -Daily CBC -No signs of active bleed Hyponatremia -Due to hypovolemia, insensible water loss due to fever  -Continue IV fluids Tachycardia -Improved with IV fluids Hypertension -Hold lisinopril as blood pressures remain soft -Continue IV fluids Hyperlipidemia -Continue simvastatin Bilateral calf pain -Venous duplex will DVT  Family Communication:   Husband and daughter at beside Disposition Plan:   Home when medically stable   Antibiotics:  Vancomycin 05/19/2013>>>  Cefepime 05/19/2013>>>          Procedures/Studies: Dg Chest 2 View  05/19/2013   *RADIOLOGY REPORT*  Clinical Data: Neutropenic fever.  CHEST - 2 VIEW  Comparison: May 02, 2013  Findings: There is no focal infiltrate, pulmonary edema, or pleural effusion.  The mediastinal contour and cardiac silhouette are stable.  Right central venous line is unchanged.  The soft tissues and osseous structures are stable.  IMPRESSION: No focal pneumonia.   Original Report Authenticated By: Sherian Rein, M.D.   Dg Chest 2 View  04/29/2013   *RADIOLOGY REPORT*  Clinical Data: Hypertension  CHEST - 2 VIEW  Comparison: None.  Findings: Cardiomediastinal silhouette appears normal.  No acute pulmonary disease is noted.  Bony thorax is intact.  Mild dextroscoliosis of thoracic spine is noted.  Mild hyperexpansion of the lungs is noted.  IMPRESSION: No acute cardiopulmonary abnormality seen.   Original Report Authenticated By: Lupita Raider.,  M.D.   Mr Breast Bilateral W Wo Contrast  04/25/2013   *RADIOLOGY REPORT*  Clinical Data:  New diagnosis of invasive carcinoma with papillary features.  MRI BILATERAL BREASTS WITHOUT AND WITH CONTRAST  Technique: Multiplanar, multisequence MR images of the bilateral breasts were obtained prior to and following the intravenous administration of 9ml of Multihance.  Labs: BUN 15, creatinine 0.9, GFR 63  Comparison:  Recent imaging examinations.  FINDINGS:  Breast composition:  c:  Heterogeneous fibroglandular tissue  Background parenchymal enhancement:  Moderate  Right breast:  No mass or abnormal enhancement.  Left breast: An irregular mass is identified 12 o'clock in the left breast, measuring 2.4 cm AP x 1.3 cm TR x 2.4 cm CC. This correlates with the biopsy proven invasive carcinoma.  Lymph nodes:  No abnormal appearing axillary, subpectoral, or internal mammary lymph nodes. In the lower chest anteriorly, there is a nonspecific 0.6 cm right intercostal lymph node.  Ancillary findings:  Tiny right pleural effusion.  IMPRESSION: 1. A 2.4 x 1.3 x 2.4 cm mass at 12 o'clock in the left breast, correlating with the biopsy proven invasive carcinoma.  No other areas of abnormal enhancement in either breast. 2. Nonspecific 0.6 cm right intercostal lymph node.  RECOMMENDATION: Treatment planning.  BI-RADS CATEGORY 6:  Known biopsy-proven malignancy - appropriate action should be taken.  THREE-DIMENSIONAL MR IMAGE RENDERING ON INDEPENDENT WORKSTATION:  Three-dimensional MR images were rendered by post-processing of the original MR data on a DynaCad workstation.  The three-dimensional MR images were interpreted, and findings were reported in the accompanying complete MRI report for this study.   Original Report Authenticated By: Jerene Dilling, M.D.   Dg Chest Port 1 View  05/02/2013   *RADIOLOGY REPORT*  Clinical Data: Port-A-Cath placement.  New diagnosis of breast cancer.  PORTABLE CHEST - 1 VIEW   Comparison: 04/29/2013  Findings: Power port has been inserted.  The tip is at the cavoatrial junction.  The heart size and pulmonary vascularity are normal and the lungs are clear.  No pneumothorax.  Slight thoracic scoliosis.  IMPRESSION: Power port in good position.  No pneumothorax.   Original Report Authenticated By: Francene Boyers, M.D.   Dg C-arm 1-60 Min-no Report  05/02/2013   CLINICAL DATA: port-a-cath   C-ARM 1-60 MINUTES  Fluoroscopy was utilized by the requesting physician.  No radiographic  interpretation.          Subjective: Patient is feeling better but continues to feel weak generally. Denies any fevers, chills, chest discomfort, shortness breath, vomiting, diarrhea, abdominal pain, dysuria, dizziness. She does have nausea. She complains of bilateral calf pain.   Objective: Filed Vitals:   05/19/13 1810 05/19/13 2210 05/20/13 0522  BP: 129/61 106/58 100/58  Pulse: 101 86 70  Temp: 98.5 F (36.9 C) 99 F (37.2 C) 98.4 F (36.9 C)  TempSrc: Oral Oral Oral  Resp: 22 18 14   Height:  4\' 10"  (1.473 m)   Weight:  49 kg (108 lb 0.4 oz)   SpO2: 99% 98% 100%    Intake/Output Summary (Last 24 hours) at 05/20/13 1227 Last data filed at 05/20/13 0834  Gross per 24 hour  Intake 1721.67 ml  Output   1000 ml  Net 721.67 ml   Weight change:  Exam:   General:  Pt is alert, follows commands appropriately, not in acute distress  HEENT: No icterus, No thrush, [place]/AT, no meningismus  Cardiovascular: RRR, S1/S2, no rubs, no gallops  Respiratory: CTA bilaterally, no wheezing, no crackles, no rhonchi  Abdomen: Soft/+BS, non tender, non distended, no guarding  Extremities: No edema, No lymphangitis, No petechiae, No rashes, no synovitis; bilateral calf pain with palpation  Data Reviewed: Basic Metabolic Panel:  Recent Labs Lab 05/19/13 1856 05/20/13 0540  NA 133* 135  K 4.2 3.9  CL 96 101  CO2 27 26  GLUCOSE 130* 124*  BUN 17 16  CREATININE 0.74 0.79  CALCIUM  9.4 8.6   Liver Function Tests:  Recent Labs Lab 05/19/13 1856  AST 8  ALT 11  ALKPHOS 75  BILITOT 0.7  PROT 6.5  ALBUMIN 3.2*   No results found for this basename: LIPASE, AMYLASE,  in the last 168 hours No results found for this basename: AMMONIA,  in the last 168 hours CBC:  Recent Labs Lab 05/19/13 1856 05/20/13 0540  WBC 0.9* 1.3*  NEUTROABS 0.1* 0.1*  HGB 10.6* 9.8*  HCT 31.4* 28.9*  MCV 86.5 87.3  PLT 99* 87*   Cardiac Enzymes: No results found for this basename: CKTOTAL, CKMB, CKMBINDEX, TROPONINI,  in the last 168 hours BNP: No components found with this basename: POCBNP,  CBG: No results found for this basename: GLUCAP,  in the last 168 hours  No results found for this or any previous visit (from the past 240 hour(s)).   Scheduled Meds: . ceFEPime (MAXIPIME) IV  2 g Intravenous Q12H  . lisinopril  10 mg Oral q morning - 10a  . simvastatin  40 mg Oral QPM  . vancomycin  1,000 mg Intravenous Q24H   Continuous Infusions: . dextrose 5 % and 0.9% NaCl 100 mL/hr at 05/20/13 1048     Dezmen Alcock, DO  [facility] Pager [phone]  If 7PM-7AM, please contact night-coverage  [email or web address] Password [email or web address] 05/20/2013, 12:27 PM   LOS: 1 day

## 2013-05-20 NOTE — Consult Note (Signed)
Ellen Beltran   DOB:[birth year]   GN#:[id number]   HQI#:[id number]  Subjective: Patient is a 67 year old female with clinical stage II triple negative invasive ductal carcinoma of the left breast.  She is cycle 1 day 8 of neoadjuvant adriamycin/cytoxan with Neulasta support.  She called our office yesterday at 430pm with a temp of 100.9 and general weakness.  We recommended she go to the ER.  She did and is now admitted with Febrile neutropenia.  She is doing well.  She denies further fevers, and has undergone a full fever work up.  A urinalysis and chest xray are non-revealing.  Blood cultures and urine culture are pending.  An ultrasound of the legs is pending due to bilateral calf pain.     Objective:  Filed Vitals:   05/20/13 0522  BP: 100/58  Pulse: 70  Temp: 98.4 F (36.9 C)  Resp: 14    Body mass index is 22.58 kg/(m^2).  Intake/Output Summary (Last 24 hours) at 05/20/13 1421 Last data filed at 05/20/13 0834  Gross per 24 hour  Intake 1721.67 ml  Output   1000 ml  Net 721.67 ml     Sclerae unicteric  Oropharynx clear  No peripheral adenopathy  Lungs clear -- no rales or rhonchi  Heart regular rate and rhythm  Abdomen benign  MSK no focal spinal tenderness, no peripheral                        edema  Neuro nonfocal    CBG (last 3)  No results found for this basename: GLUCAP,  in the last 72 hours   Labs:  Lab Results  Component Value Date   WBC 1.3* 05/20/2013   HGB 9.8* 05/20/2013   HCT 28.9* 05/20/2013   MCV 87.3 05/20/2013   PLT 87* 05/20/2013   NEUTROABS 0.1* 05/20/2013    Urine Studies No results found for this basename: UACOL, UAPR, USPG, UPH, UTP, UGL, UKET, UBIL, UHGB, UNIT, UROB, ULEU, UEPI, UWBC, URBC, UBAC, CAST, CRYS, UCOM, BILUA,  in the last 72 hours  Basic Metabolic Panel:  Recent Labs Lab 05/19/13 1856 05/20/13 0540  NA 133* 135  K 4.2 3.9  CL 96 101  CO2 27 26  GLUCOSE 130* 124*  BUN 17 16  CREATININE 0.74 0.79  CALCIUM 9.4 8.6    GFR Estimated Creatinine Clearance: 45.3 ml/min (by C-G formula based on Cr of 0.79). Liver Function Tests:  Recent Labs Lab 05/19/13 1856  AST 8  ALT 11  ALKPHOS 75  BILITOT 0.7  PROT 6.5  ALBUMIN 3.2*   No results found for this basename: LIPASE, AMYLASE,  in the last 168 hours No results found for this basename: AMMONIA,  in the last 168 hours Coagulation profile No results found for this basename: INR, PROTIME,  in the last 168 hours  CBC:  Recent Labs Lab 05/19/13 1856 05/20/13 0540  WBC 0.9* 1.3*  NEUTROABS 0.1* 0.1*  HGB 10.6* 9.8*  HCT 31.4* 28.9*  MCV 86.5 87.3  PLT 99* 87*   Cardiac Enzymes: No results found for this basename: CKTOTAL, CKMB, CKMBINDEX, TROPONINI,  in the last 168 hours BNP: No components found with this basename: POCBNP,  CBG: No results found for this basename: GLUCAP,  in the last 168 hours D-Dimer No results found for this basename: DDIMER,  in the last 72 hours Hgb A1c No results found for this basename: HGBA1C,  in the last 72 hours Lipid  Profile No results found for this basename: CHOL, HDL, LDLCALC, TRIG, CHOLHDL, LDLDIRECT,  in the last 72 hours Thyroid function studies No results found for this basename: TSH, T4TOTAL, FREET3, T3FREE, THYROIDAB,  in the last 72 hours Anemia work up No results found for this basename: VITAMINB12, FOLATE, FERRITIN, TIBC, IRON, RETICCTPCT,  in the last 72 hours Microbiology No results found for this or any previous visit (from the past 240 hour(s)).    Studies:  Dg Chest 2 View  05/19/2013   *RADIOLOGY REPORT*  Clinical Data: Neutropenic fever.  CHEST - 2 VIEW  Comparison: May 02, 2013  Findings: There is no focal infiltrate, pulmonary edema, or pleural effusion.  The mediastinal contour and cardiac silhouette are stable.  Right central venous line is unchanged.  The soft tissues and osseous structures are stable.  IMPRESSION: No focal pneumonia.   Original Report Authenticated By: Sherian Rein, M.D.    Assessment/plan: 67 y.o. female with  1. Stage II breast cancer undergoing neoadjuvant chemotherapy with Adriamycin/Cytoxan currently cycle 1 day 8 with Neulasta support.  She will see [place] in clinic for future treatment determinations on Friday, 05/27/13.    2. Febrile neutropenia:  Patient is afebrile and hemodynamically stable, received Neulasta 7 days ago.  Fever work up thus far is negative.  Cultures are pending.  Patient will continue broad spectrum antibiotics.    3. FEN: Continue IV fluids, encouraged continued PO intake.   4. Bilateral calf pain: patient to have ultrasound.    5. We will see the patient back in clinic next week and will follow along with the hospitalist team.  Thank you for your care of this patient.    Cherie Ouch Lyn Hollingshead, NP Medical Oncology [facility] Phone: [phone] Augustin Schooling 05/20/2013

## 2013-05-20 NOTE — Progress Notes (Signed)
*  Preliminary Results* Bilateral lower extremity venous duplex completed. Bilateral lower extremities are negative for deep vein thrombosis. There is no evidence of Baker's cyst bilaterally.  05/20/2013  Ayo Guarino, RVT, RDCS, RDMS  

## 2013-05-21 LAB — CBC WITH DIFFERENTIAL/PLATELET
Eosinophils Relative: 10 % — ABNORMAL HIGH (ref 0–5)
Lymphocytes Relative: 66 % — ABNORMAL HIGH (ref 12–46)
Monocytes Absolute: 0.2 10*3/uL (ref 0.1–1.0)
Monocytes Relative: 9 % (ref 3–12)
Neutrophils Relative %: 14 % — ABNORMAL LOW (ref 43–77)
Platelets: 66 10*3/uL — ABNORMAL LOW (ref 150–400)
RBC: 2.91 MIL/uL — ABNORMAL LOW (ref 3.87–5.11)
WBC: 2 10*3/uL — ABNORMAL LOW (ref 4.0–10.5)

## 2013-05-21 LAB — URINE CULTURE
Colony Count: NO GROWTH
Culture: NO GROWTH

## 2013-05-21 LAB — BASIC METABOLIC PANEL
Chloride: 106 mEq/L (ref 96–112)
Creatinine, Ser: 0.69 mg/dL (ref 0.50–1.10)
GFR calc Af Amer: >90 mL/min (ref >90)

## 2013-05-21 MED ORDER — SODIUM CHLORIDE 0.9 % IV SOLN
INTRAVENOUS | Status: DC
  Administered 2013-05-21: 14:00:00 via INTRAVENOUS
  Filled 2013-05-21 (×3): qty 1000

## 2013-05-21 NOTE — Progress Notes (Signed)
[facility] PROGRESS NOTE  Ellen Beltran ZOX:[id number] DOB: 05-Mar-1946 DOA: 05/19/2013 PCP: [name], MD  Assessment/Plan: Febrile Neutropenia  -ANC 52-->280 -Patient has remained afebrile since admission  -Blood cultures are negative to date  -Patient received Neulasta on the day after her chemotherapy  -Chest x-ray negative for infiltrates--patient without any pulmonary symptoms  -Port-A-Cath placed 05/02/2013  -Dr. Welton Flakes notified of patient's admission  Pancytopenia  -Due to chemotherapy  -Daily CBC -No signs of active bleed Right elbow/forearm pain/edema -MRI right elbow/forearm due to decrease inflammatory response in a patient with febrile neutropenia  -denies any trauma Diarrhea -C. difficile PCR Hyponatremia  -Due to hypovolemia, insensible water loss due to fever  -Continue IV fluids  Tachycardia  -Improved with IV fluids  Hypertension  -Hold lisinopril as blood pressures remain soft  -Continue IV fluids  Hyperlipidemia  -Continue simvastatin  Bilateral calf pain  -Venous duplex--negative for DVT Family Communication: Husband and daughter at beside  Disposition Plan: Home when medically stable  Antibiotics:  Vancomycin 05/19/2013>>>  Cefepime 05/19/2013>>>      Procedures/Studies: Dg Chest 2 View  05/19/2013   *RADIOLOGY REPORT*  Clinical Data: Neutropenic fever.  CHEST - 2 VIEW  Comparison: May 02, 2013  Findings: There is no focal infiltrate, pulmonary edema, or pleural effusion.  The mediastinal contour and cardiac silhouette are stable.  Right central venous line is unchanged.  The soft tissues and osseous structures are stable.  IMPRESSION: No focal pneumonia.   Original Report Authenticated By: Sherian Rein, M.D.   Dg Chest 2 View  04/29/2013   *RADIOLOGY REPORT*  Clinical Data: Hypertension  CHEST - 2 VIEW  Comparison: None.  Findings: Cardiomediastinal silhouette appears normal.  No acute pulmonary disease is noted.  Bony thorax is  intact.  Mild dextroscoliosis of thoracic spine is noted.  Mild hyperexpansion of the lungs is noted.  IMPRESSION: No acute cardiopulmonary abnormality seen.   Original Report Authenticated By: Lupita Raider.,  M.D.   Mr Breast Bilateral W Wo Contrast  04/25/2013   *RADIOLOGY REPORT*  Clinical Data:  New diagnosis of invasive carcinoma with papillary features.  MRI BILATERAL BREASTS WITHOUT AND WITH CONTRAST  Technique: Multiplanar, multisequence MR images of the bilateral breasts were obtained prior to and following the intravenous administration of 9ml of Multihance.  Labs: BUN 15, creatinine 0.9, GFR 63  Comparison:  Recent imaging examinations.  FINDINGS:  Breast composition:  c:  Heterogeneous fibroglandular tissue  Background parenchymal enhancement:  Moderate  Right breast:  No mass or abnormal enhancement.  Left breast: An irregular mass is identified 12 o'clock in the left breast, measuring 2.4 cm AP x 1.3 cm TR x 2.4 cm CC. This correlates with the biopsy proven invasive carcinoma.  Lymph nodes:  No abnormal appearing axillary, subpectoral, or internal mammary lymph nodes. In the lower chest anteriorly, there is a nonspecific 0.6 cm right intercostal lymph node.  Ancillary findings:  Tiny right pleural effusion.  IMPRESSION: 1. A 2.4 x 1.3 x 2.4 cm mass at 12 o'clock in the left breast, correlating with the biopsy proven invasive carcinoma.  No other areas of abnormal enhancement in either breast. 2. Nonspecific 0.6 cm right intercostal lymph node.  RECOMMENDATION: Treatment planning.  BI-RADS CATEGORY 6:  Known biopsy-proven malignancy - appropriate action should be taken.  THREE-DIMENSIONAL MR IMAGE RENDERING ON INDEPENDENT WORKSTATION:  Three-dimensional MR images were rendered by post-processing of the original MR data on a DynaCad workstation.  The three-dimensional MR images were interpreted, and  findings were reported in the accompanying complete MRI report for this study.   Original Report  Authenticated By: Jerene Dilling, M.D.   Dg Chest Port 1 View  05/02/2013   *RADIOLOGY REPORT*  Clinical Data: Port-A-Cath placement.  New diagnosis of breast cancer.  PORTABLE CHEST - 1 VIEW  Comparison: 04/29/2013  Findings: Power port has been inserted.  The tip is at the cavoatrial junction.  The heart size and pulmonary vascularity are normal and the lungs are clear.  No pneumothorax.  Slight thoracic scoliosis.  IMPRESSION: Power port in good position.  No pneumothorax.   Original Report Authenticated By: Francene Boyers, M.D.   Dg C-arm 1-60 Min-no Report  05/02/2013   CLINICAL DATA: port-a-cath   C-ARM 1-60 MINUTES  Fluoroscopy was utilized by the requesting physician.  No radiographic  interpretation.          Subjective: Patient complains of right elbow/forearm pain. Started on the day prior to admission. Essentially unchanged since admission. Denies any fevers, chills, chest pain, shortness breath, vomiting.  patient had 4 loose stools today. No hematochezia or melena. No abdominal pain.  Objective: Filed Vitals:   05/20/13 0522 05/20/13 1430 05/20/13 2321 05/21/13 0523  BP: 100/58 114/62 102/43 103/48  Pulse: 70 68 60 66  Temp: 98.4 F (36.9 C) 98.5 F (36.9 C) 98 F (36.7 C) 98.1 F (36.7 C)  TempSrc: Oral Oral Oral Oral  Resp: 14 20 16 16   Height:      Weight:      SpO2: 100% 100% 100% 100%    Intake/Output Summary (Last 24 hours) at 05/21/13 1149 Last data filed at 05/21/13 0600  Gross per 24 hour  Intake   1440 ml  Output      0 ml  Net   1440 ml   Weight change:  Exam:   General:  Pt is alert, follows commands appropriately, not in acute distress  HEENT: No icterus, No thrush, [place]/AT  Cardiovascular: RRR, S1/S2, no rubs, no gallops  Respiratory: CTA bilaterally, no wheezing, no crackles, no rhonchi  Abdomen: Soft/+BS, non tender, non distended, no guarding  Extremities: No lymphangitis, No petechiae, No rashes, no synovitis; right tenderness  about the right elbow near the olecranon process without any lymphangitis, crepitance. No edema.   Data Reviewed: Basic Metabolic Panel:  Recent Labs Lab 05/19/13 1856 05/20/13 0540 05/21/13 0420  NA 133* 135 135  K 4.2 3.9 3.8  CL 96 101 106  CO2 27 26 24   GLUCOSE 130* 124* 106*  BUN 17 16 11   CREATININE 0.74 0.79 0.69  CALCIUM 9.4 8.6 8.4   Liver Function Tests:  Recent Labs Lab 05/19/13 1856  AST 8  ALT 11  ALKPHOS 75  BILITOT 0.7  PROT 6.5  ALBUMIN 3.2*   No results found for this basename: LIPASE, AMYLASE,  in the last 168 hours No results found for this basename: AMMONIA,  in the last 168 hours CBC:  Recent Labs Lab 05/19/13 1856 05/20/13 0540 05/21/13 0420  WBC 0.9* 1.3* 2.0*  NEUTROABS 0.1* 0.1* 0.3*  HGB 10.6* 9.8* 8.5*  HCT 31.4* 28.9* 25.7*  MCV 86.5 87.3 88.3  PLT 99* 87* 66*   Cardiac Enzymes: No results found for this basename: CKTOTAL, CKMB, CKMBINDEX, TROPONINI,  in the last 168 hours BNP: No components found with this basename: POCBNP,  CBG: No results found for this basename: GLUCAP,  in the last 168 hours  Recent Results (from the past 240 hour(s))  URINE CULTURE  Status: None   Collection Time    05/19/13  9:12 PM      Result Value Range Status   Specimen Description URINE, CLEAN CATCH   Final   Special Requests NONE   Final   Culture  Setup Time     Final   Value: 05/20/2013 05:41     Performed at [facility] Count     Final   Value: NO GROWTH     Performed at [facility]   Culture     Final   Value: NO GROWTH     Performed at [facility]   Report Status 05/21/2013 FINAL   Final     Scheduled Meds: . ceFEPime (MAXIPIME) IV  2 g Intravenous Q12H  . simvastatin  40 mg Oral QPM  . vancomycin  1,000 mg Intravenous Q24H   Continuous Infusions: . dextrose 5 % and 0.9% NaCl 100 mL/hr at 05/21/13 0542     Meri Pelot, DO  [facility] Pager [phone]  If 7PM-7AM, please  contact night-coverage [email or web address] Password [email or web address] 05/21/2013, 11:49 AM   LOS: 2 days

## 2013-05-22 DIAGNOSIS — C50119 Malignant neoplasm of central portion of unspecified female breast: Secondary | ICD-10-CM

## 2013-05-22 LAB — CBC WITH DIFFERENTIAL/PLATELET
Basophils Absolute: 0 10*3/uL (ref 0.0–0.1)
HCT: 25.4 % — ABNORMAL LOW (ref 36.0–46.0)
Lymphocytes Relative: 41 % (ref 12–46)
Monocytes Relative: 13 % — ABNORMAL HIGH (ref 3–12)
Neutro Abs: 1.5 10*3/uL — ABNORMAL LOW (ref 1.7–7.7)
Platelets: 78 10*3/uL — ABNORMAL LOW (ref 150–400)
RDW: 12.6 % (ref 11.5–15.5)
WBC: 3.6 10*3/uL — ABNORMAL LOW (ref 4.0–10.5)

## 2013-05-22 LAB — BASIC METABOLIC PANEL
GFR calc Af Amer: >90 mL/min (ref >90)
GFR calc non Af Amer: >90 mL/min (ref >90)
Potassium: 4.2 mEq/L (ref 3.5–5.1)
Sodium: 137 mEq/L (ref 135–145)

## 2013-05-22 NOTE — Discharge Summary (Signed)
Physician Discharge Summary  Ellen Beltran:[id number] DOB: 1946-06-09 DOA: 05/19/2013  PCP: [name], MD  Admit date: 05/19/2013 Discharge date: 05/22/2013  Recommendations for Outpatient Follow-up:  1. Pt will need to follow up with PCP in 2 weeks post discharge 2. Please obtain BMP to evaluate electrolytes and kidney function 3. Please also check CBC to evaluate Hg and Hct levels   Discharge Diagnoses:  Principal Problem:   Neutropenic fever Active Problems:   Pancytopenia   Hyponatremia   SIRS (systemic inflammatory response syndrome) Febrile Neutropenia  -ANC 52-->280-->1476  -Patient has remained afebrile since admission  -Blood cultures are negative to date  -05/22/2013 discontinue vancomycin and cefepime  -Patient received Neulasta on the day after her chemotherapy  -Chest x-ray negative for infiltrates--patient without any pulmonary symptoms  -Port-A-Cath placed 05/02/2013  -Dr. Welton Flakes notified of patient's admission  Pancytopenia  -Due to chemotherapy  -Daily CBC  -No signs of active bleed  -On the day of discharge, WBC was 10.6, hemoglobin 8.7, platelets 124,000. Right elbow/forearm pain/edema  -MRI right elbow/forearm due to decrease inflammatory response in a patient with febrile neutropenia  -denies any trauma  -MRI right elbow-->focal area of edema like signal abnormality in  the extensor carpi radialis muscle. This is in the location of the  patient's palpable abnormality.  -most likely representing a focal muscle tear or nonspecific myositis. Diarrhea  -C. difficile PCR--> canceled as the patient has no diarrhea  Hyponatremia  -Due to hypovolemia, insensible water loss due to fever  -Continue IV fluids--> saline lock IV fluids as this has improved  Tachycardia  -Improved with IV fluids  Hypertension  -Hold lisinopril as blood pressures remain soft  -Continue IV fluids  -The patient's blood pressure remained stable 110-120s throughout the  hospitalization -She will follow up with her primary care provider to determine her future need for antihypertensive medications Hyperlipidemia  -Continue simvastatin  Bilateral calf pain  -Venous duplex--negative for DVT  Family Communication: Husband and daughter at beside  Disposition Plan: Home when medically stable  Antibiotics:  Vancomycin 05/19/2013>>> 05/22/2013  Cefepime 05/19/2013>>> 05/22/2013   Discharge Condition: Stable  Disposition:  discharge home  Diet: Regular Wt Readings from Last 3 Encounters:  05/19/13 49 kg (108 lb 0.4 oz)  05/13/13 49.261 kg (108 lb 9.6 oz)  04/29/13 48.626 kg (107 lb 3.2 oz)    History of present illness:  67 y.o. female  With history of recently diagnosed breast cancer (invasive ductal carcinoma with papillary features) followed by Dr. Welton Flakes. Had her first Chemotherapy last Friday 8/29. She states that she has had 2 days of chills. Reportedly she took her own temperature orally and had a temperature of 100.9. Patient decided to come to the ed for further evaluation. She also endorses BL leg cramps and poor oral intake. She denies any cough, SOB, sputum production, dysuria, new rashes.   After admission, the blood and urine cultures were obtained. The patient was started on empiric vancomycin and cefepime. The patient's fever improved. She remained afebrile throughout the hospitalization. On the day of admission, her WBC count was 0.9 with absolute neutrophil count of 52. The patient had received Neulasta on 05/14/2013. Blood cell count gradually improved as well as her absolutely triple A. hospitalization. The patient remained hemodynamically stable. Her blood cultures and urine cultures remained negative. The patient complained of nonspecific right elbow pain there was focal new the olecranon process. She also had a few boils/furuncles on her back. These were observed off antibiotics  and remained clinically stable. The patient was taken off of her  lisinopril and her blood pressure remained stable. The patient was instructed to followup with her primary care physician to determine if she needs antihypertensive medications in the future. The patient was watched for 24 hours off of antibiotics. She remained afebrile and hemodynamically stable. She will be discharged home in stable condition. MRI of the right elbow area showed focal area of edema in the extensor carpi radialis muscle likely representing a focal muscle tear or nonspecific myositis.    Consultants: Med[facility]--Dr. Welton Flakes  Discharge Exam: Filed Vitals:   05/22/13 0537  BP: 103/51  Pulse: 77  Temp: 97.8 F (36.6 C)  Resp: 18   Filed Vitals:   05/21/13 0523 05/21/13 1412 05/21/13 2242 05/22/13 0537  BP: 103/48 103/54 113/59 103/51  Pulse: 66 79 79 77  Temp: 98.1 F (36.7 C) 98.3 F (36.8 C) 98.3 F (36.8 C) 97.8 F (36.6 C)  TempSrc: Oral Oral Oral Oral  Resp: 16 18 18 18   Height:      Weight:      SpO2: 100% 100% 100% 100%   General: A&O x 3, NAD, pleasant, cooperative Cardiovascular: RRR, no rub, no gallop, no S3 Respiratory: CTAB, no wheeze, no rhonchi Abdomen:soft, nontender, nondistended, positive bowel sounds Extremities: No edema, No lymphangitis, no petechiae; right elbow without any erythema, synovitis, crepitance.  Discharge Instructions   Future Appointments Provider Department Dept Phone   05/27/2013 9:15 AM Mauri Brooklyn [facility] MED[facility] [phone]   05/27/2013 9:45 AM Augustin Schooling, NP [facility] MEDICAL ONCOLOGY [phone]   05/27/2013 10:45 AM [facility] [address] [place] [facility] MED[facility] [phone]   05/28/2013 10:45 AM [facility] Inj Nurse [place] [facility] MED[facility] ONCOL[facility] [phone]   06/02/2013 9:00 AM Kandis Cocking, MD [facility] [place] [facility] [place] [phone]   06/03/2013 12:45 PM [facility] Only [place] [facility] MED[facility] ONCOL[facility] [phone]   06/03/2013  1:15 PM Augustin Schooling, NP [facility] MEDICAL ONCOLOGY [phone]   06/10/2013 10:15 AM Krista Blue [facility] MEDICAL ONCOLOGY [phone]   06/10/2013 10:45 AM Victorino December, MD [facility] MEDICAL ONCOLOGY [phone]   06/11/2013 10:30 AM [facility] Inj Nurse [place] [facility] MEDICAL ONCOL[facility] [phone]   06/17/2013 10:45 AM Beverely Pace [facility] MEDICAL ONCOL[facility]Y [phone]   06/17/2013 11:15 AM Augustin Schooling, NP [facility] MEDICAL ONCOLOGY [phone]   06/24/2013 11:15 AM Krista Blue [facility] MEDICAL ONCOLOGY [phone]   06/24/2013 11:45 AM Victorino December, MD [facility] MEDICAL ONCOLOGY [phone]   06/25/2013 9:30 AM [facility] Inj Nurse [place] [facility] MEDICAL ONCOL[facility] [phone]       Medication List    ASK your doctor about these medications       lidocaine-prilocaine cream  Commonly known as:  EMLA  Apply topically as needed.     lisinopril 10 MG tablet  Commonly known as:  PRINIVIL,ZESTRIL  Take 10 mg by mouth every morning.     LORazepam 0.5 MG tablet  Commonly known as:  ATIVAN  Take 1 tablet (0.5 mg total) by mouth every 6 (six) hours as needed (Nausea or vomiting).     ondansetron 8 MG tablet  Commonly known as:  ZOFRAN  Take 1 tablet (8 mg total) by mouth 2 (two) times daily as needed. Take two times a day as needed  for nausea or vomiting starting on the third day after chemotherapy.     PRESCRIPTION MEDICATION  Chemotherapy at [facility].     prochlorperazine 10 MG tablet  Commonly known as:  COMPAZINE  Take 1 tablet (10 mg total) by mouth every 6 (six) hours as needed (Nausea or vomiting).     simvastatin 40 MG tablet  Commonly known as:  ZOCOR  Take 40 mg by mouth every evening.         The results of significant diagnostics from this hospitalization (including imaging, microbiology, ancillary and  laboratory) are listed below for reference.    Significant Diagnostic Studies: Dg Chest 2 View  05/19/2013   *RADIOLOGY REPORT*  Clinical Data: Neutropenic fever.  CHEST - 2 VIEW  Comparison: May 02, 2013  Findings: There is no focal infiltrate, pulmonary edema, or pleural effusion.  The mediastinal contour and cardiac silhouette are stable.  Right central venous line is unchanged.  The soft tissues and osseous structures are stable.  IMPRESSION: No focal pneumonia.   Original Report Authenticated By: Sherian Rein, M.D.   Dg Chest 2 View  04/29/2013   *RADIOLOGY REPORT*  Clinical Data: Hypertension  CHEST - 2 VIEW  Comparison: None.  Findings: Cardiomediastinal silhouette appears normal.  No acute pulmonary disease is noted.  Bony thorax is intact.  Mild dextroscoliosis of thoracic spine is noted.  Mild hyperexpansion of the lungs is noted.  IMPRESSION: No acute cardiopulmonary abnormality seen.   Original Report Authenticated By: Lupita Raider.,  M.D.   Mr Breast Bilateral W Wo Contrast  04/25/2013   *RADIOLOGY REPORT*  Clinical Data:  New diagnosis of invasive carcinoma with papillary features.  MRI BILATERAL BREASTS WITHOUT AND WITH CONTRAST  Technique: Multiplanar, multisequence MR images of the bilateral breasts were obtained prior to and following the intravenous administration of 9ml of Multihance.  Labs: BUN 15, creatinine 0.9, GFR 63  Comparison:  Recent imaging examinations.  FINDINGS:  Breast composition:  c:  Heterogeneous fibroglandular tissue  Background parenchymal enhancement:  Moderate  Right breast:  No mass or abnormal enhancement.  Left breast: An irregular mass is identified 12 o'clock in the left breast, measuring 2.4 cm AP x 1.3 cm TR x 2.4 cm CC. This correlates with the biopsy proven invasive carcinoma.  Lymph nodes:  No abnormal appearing axillary, subpectoral, or internal mammary lymph nodes. In the lower chest anteriorly, there is a nonspecific 0.6 cm right intercostal lymph  node.  Ancillary findings:  Tiny right pleural effusion.  IMPRESSION: 1. A 2.4 x 1.3 x 2.4 cm mass at 12 o'clock in the left breast, correlating with the biopsy proven invasive carcinoma.  No other areas of abnormal enhancement in either breast. 2. Nonspecific 0.6 cm right intercostal lymph node.  RECOMMENDATION: Treatment planning.  BI-RADS CATEGORY 6:  Known biopsy-proven malignancy - appropriate action should be taken.  THREE-DIMENSIONAL MR IMAGE RENDERING ON INDEPENDENT WORKSTATION:  Three-dimensional MR images were rendered by post-processing of the original MR data on a DynaCad workstation.  The three-dimensional MR images were interpreted, and findings were reported in the accompanying complete MRI report for this study.   Original Report Authenticated By: Jerene Dilling, M.D.   Dg Chest Port 1 View  05/02/2013   *RADIOLOGY REPORT*  Clinical Data: Port-A-Cath placement.  New diagnosis of breast cancer.  PORTABLE CHEST - 1 VIEW  Comparison: 04/29/2013  Findings: Power port has been inserted.  The tip is at the cavoatrial junction.  The heart size and pulmonary vascularity  are normal and the lungs are clear.  No pneumothorax.  Slight thoracic scoliosis.  IMPRESSION: Power port in good position.  No pneumothorax.   Original Report Authenticated By: Francene Boyers, M.D.   Dg C-arm 1-60 Min-no Report  05/02/2013   CLINICAL DATA: port-a-cath   C-ARM 1-60 MINUTES  Fluoroscopy was utilized by the requesting physician.  No radiographic  interpretation.      Microbiology: Recent Results (from the past 240 hour(s))  CULTURE, BLOOD (ROUTINE X 2)     Status: None   Collection Time    05/19/13  8:30 PM      Result Value Range Status   Specimen Description BLOOD L HAND   Final   Special Requests NONE BOTTLES DRAWN AEROBIC AND ANAEROBIC 5CC   Final   Culture  Setup Time     Final   Value: 05/20/2013 03:15     Performed at [facility]   Culture     Final   Value:        BLOOD CULTURE RECEIVED  NO GROWTH TO DATE CULTURE WILL BE HELD FOR 5 DAYS BEFORE ISSUING A FINAL NEGATIVE REPORT     Performed at [facility]   Report Status PENDING   Incomplete  CULTURE, BLOOD (ROUTINE X 2)     Status: None   Collection Time    05/19/13  8:30 PM      Result Value Range Status   Specimen Description BLOOD R HAND   Final   Special Requests NONE BOTTLES DRAWN AEROBIC AND ANAEROBIC 3CC   Final   Culture  Setup Time     Final   Value: 05/20/2013 03:15     Performed at [facility]   Culture     Final   Value:        BLOOD CULTURE RECEIVED NO GROWTH TO DATE CULTURE WILL BE HELD FOR 5 DAYS BEFORE ISSUING A FINAL NEGATIVE REPORT     Performed at [facility]   Report Status PENDING   Incomplete  URINE CULTURE     Status: None   Collection Time    05/19/13  9:12 PM      Result Value Range Status   Specimen Description URINE, CLEAN CATCH   Final   Special Requests NONE   Final   Culture  Setup Time     Final   Value: 05/20/2013 05:41     Performed at [facility] Count     Final   Value: NO GROWTH     Performed at [facility]   Culture     Final   Value: NO GROWTH     Performed at [facility]   Report Status 05/21/2013 FINAL   Final     Labs: Basic Metabolic Panel:  Recent Labs Lab 05/19/13 1856 05/20/13 0540 05/21/13 0420 05/22/13 0246  NA 133* 135 135 137  K 4.2 3.9 3.8 4.2  CL 96 101 106 108  CO2 27 26 24 23   GLUCOSE 130* 124* 106* 88  BUN 17 16 11 13   CREATININE 0.74 0.79 0.69 0.62  CALCIUM 9.4 8.6 8.4 8.6   Liver Function Tests:  Recent Labs Lab 05/19/13 1856  AST 8  ALT 11  ALKPHOS 75  BILITOT 0.7  PROT 6.5  ALBUMIN 3.2*   No results found for this basename: LIPASE, AMYLASE,  in the last 168 hours No results found for this basename: AMMONIA,  in the  last 168 hours CBC:  Recent Labs Lab 05/19/13 1856 05/20/13 0540 05/21/13 0420 05/22/13 0246  WBC 0.9* 1.3* 2.0* 3.6*  NEUTROABS 0.1* 0.1* 0.3*  1.5*  HGB 10.6* 9.8* 8.5* 8.4*  HCT 31.4* 28.9* 25.7* 25.4*  MCV 86.5 87.3 88.3 89.1  PLT 99* 87* 66* 78*   Cardiac Enzymes: No results found for this basename: CKTOTAL, CKMB, CKMBINDEX, TROPONINI,  in the last 168 hours BNP: No components found with this basename: POCBNP,  CBG: No results found for this basename: GLUCAP,  in the last 168 hours  Time coordinating discharge:  Greater than 30 minutes  Signed:  Deitra Craine, DO [facility] Pager: [phone] 05/22/2013, 2:43 PM

## 2013-05-22 NOTE — Progress Notes (Signed)
Pt states having 4 soft bm's today. States feeling better. Plan is for pt to be dc'd 9/8

## 2013-05-22 NOTE — Progress Notes (Signed)
[facility] PROGRESS NOTE  Ellen Beltran ZOX:[id number] DOB: 09/12/1946 DOA: 05/19/2013 PCP: [name], MD  Assessment/Plan: Febrile Neutropenia  -ANC 52-->280-->1476  -Patient has remained afebrile since admission  -Blood cultures are negative to date  -05/22/2013 discontinue vancomycin and cefepime -Patient received Neulasta on the day after her chemotherapy  -Chest x-ray negative for infiltrates--patient without any pulmonary symptoms  -Port-A-Cath placed 05/02/2013  -Dr. Welton Flakes notified of patient's admission  Pancytopenia  -Due to chemotherapy  -Daily CBC  -No signs of active bleed  Right elbow/forearm pain/edema  -MRI right elbow/forearm due to decrease inflammatory response in a patient with febrile neutropenia  -denies any trauma  Diarrhea  -C. difficile PCR--> canceled as the patient has no diarrhea Hyponatremia  -Due to hypovolemia, insensible water loss due to fever  -Continue IV fluids--> saline lock IV fluids as this has improved Tachycardia  -Improved with IV fluids  Hypertension  -Hold lisinopril as blood pressures remain soft  -Continue IV fluids  Hyperlipidemia  -Continue simvastatin  Bilateral calf pain  -Venous duplex--negative for DVT  Family Communication: Husband and daughter at beside  Disposition Plan: Home when medically stable  Antibiotics:  Vancomycin 05/19/2013>>> 05/22/2013 Cefepime 05/19/2013>>> 05/22/2013          Procedures/Studies: Dg Chest 2 View  05/19/2013   *RADIOLOGY REPORT*  Clinical Data: Neutropenic fever.  CHEST - 2 VIEW  Comparison: May 02, 2013  Findings: There is no focal infiltrate, pulmonary edema, or pleural effusion.  The mediastinal contour and cardiac silhouette are stable.  Right central venous line is unchanged.  The soft tissues and osseous structures are stable.  IMPRESSION: No focal pneumonia.   Original Report Authenticated By: Sherian Rein, M.D.   Dg Chest 2 View  04/29/2013   *RADIOLOGY  REPORT*  Clinical Data: Hypertension  CHEST - 2 VIEW  Comparison: None.  Findings: Cardiomediastinal silhouette appears normal.  No acute pulmonary disease is noted.  Bony thorax is intact.  Mild dextroscoliosis of thoracic spine is noted.  Mild hyperexpansion of the lungs is noted.  IMPRESSION: No acute cardiopulmonary abnormality seen.   Original Report Authenticated By: Lupita Raider.,  M.D.   Mr Breast Bilateral W Wo Contrast  04/25/2013   *RADIOLOGY REPORT*  Clinical Data:  New diagnosis of invasive carcinoma with papillary features.  MRI BILATERAL BREASTS WITHOUT AND WITH CONTRAST  Technique: Multiplanar, multisequence MR images of the bilateral breasts were obtained prior to and following the intravenous administration of 9ml of Multihance.  Labs: BUN 15, creatinine 0.9, GFR 63  Comparison:  Recent imaging examinations.  FINDINGS:  Breast composition:  c:  Heterogeneous fibroglandular tissue  Background parenchymal enhancement:  Moderate  Right breast:  No mass or abnormal enhancement.  Left breast: An irregular mass is identified 12 o'clock in the left breast, measuring 2.4 cm AP x 1.3 cm TR x 2.4 cm CC. This correlates with the biopsy proven invasive carcinoma.  Lymph nodes:  No abnormal appearing axillary, subpectoral, or internal mammary lymph nodes. In the lower chest anteriorly, there is a nonspecific 0.6 cm right intercostal lymph node.  Ancillary findings:  Tiny right pleural effusion.  IMPRESSION: 1. A 2.4 x 1.3 x 2.4 cm mass at 12 o'clock in the left breast, correlating with the biopsy proven invasive carcinoma.  No other areas of abnormal enhancement in either breast. 2. Nonspecific 0.6 cm right intercostal lymph node.  RECOMMENDATION: Treatment planning.  BI-RADS CATEGORY 6:  Known biopsy-proven malignancy - appropriate action should be taken.  THREE-DIMENSIONAL  MR IMAGE RENDERING ON INDEPENDENT WORKSTATION:  Three-dimensional MR images were rendered by post-processing of the original MR data  on a DynaCad workstation.  The three-dimensional MR images were interpreted, and findings were reported in the accompanying complete MRI report for this study.   Original Report Authenticated By: Jerene Dilling, M.D.   Dg Chest Port 1 View  05/02/2013   *RADIOLOGY REPORT*  Clinical Data: Port-A-Cath placement.  New diagnosis of breast cancer.  PORTABLE CHEST - 1 VIEW  Comparison: 04/29/2013  Findings: Power port has been inserted.  The tip is at the cavoatrial junction.  The heart size and pulmonary vascularity are normal and the lungs are clear.  No pneumothorax.  Slight thoracic scoliosis.  IMPRESSION: Power port in good position.  No pneumothorax.   Original Report Authenticated By: Francene Boyers, M.D.   Dg C-arm 1-60 Min-no Report  05/02/2013   CLINICAL DATA: port-a-cath   C-ARM 1-60 MINUTES  Fluoroscopy was utilized by the requesting physician.  No radiographic  interpretation.          Subjective: Patient denies any fevers, chills, chest discomfort, shortness breath, nausea, vomiting, diarrhea, abdominal pain, dysuria, hematuria.  Objective: Filed Vitals:   05/21/13 0523 05/21/13 1412 05/21/13 2242 05/22/13 0537  BP: 103/48 103/54 113/59 103/51  Pulse: 66 79 79 77  Temp: 98.1 F (36.7 C) 98.3 F (36.8 C) 98.3 F (36.8 C) 97.8 F (36.6 C)  TempSrc: Oral Oral Oral Oral  Resp: 16 18 18 18   Height:      Weight:      SpO2: 100% 100% 100% 100%    Intake/Output Summary (Last 24 hours) at 05/22/13 1220 Last data filed at 05/22/13 4098  Gross per 24 hour  Intake 2077.5 ml  Output      0 ml  Net 2077.5 ml   Weight change:  Exam:   General:  Pt is alert, follows commands appropriately, not in acute distress  HEENT: No icterus, No thrush, [place]/AT  Cardiovascular: RRR, S1/S2, no rubs, no gallops  Respiratory: CTA bilaterally, no wheezing, no crackles, no rhonchi  Abdomen: Soft/+BS, non tender, non distended, no guarding  Extremities: No edema, No lymphangitis, No  petechiae, No rashes, no synovitis; 3 scattered boils on the right trapezius area without any lymphangitis, crepitance, drainage.  Data Reviewed: Basic Metabolic Panel:  Recent Labs Lab 05/19/13 1856 05/20/13 0540 05/21/13 0420 05/22/13 0246  NA 133* 135 135 137  K 4.2 3.9 3.8 4.2  CL 96 101 106 108  CO2 27 26 24 23   GLUCOSE 130* 124* 106* 88  BUN 17 16 11 13   CREATININE 0.74 0.79 0.69 0.62  CALCIUM 9.4 8.6 8.4 8.6   Liver Function Tests:  Recent Labs Lab 05/19/13 1856  AST 8  ALT 11  ALKPHOS 75  BILITOT 0.7  PROT 6.5  ALBUMIN 3.2*   No results found for this basename: LIPASE, AMYLASE,  in the last 168 hours No results found for this basename: AMMONIA,  in the last 168 hours CBC:  Recent Labs Lab 05/19/13 1856 05/20/13 0540 05/21/13 0420 05/22/13 0246  WBC 0.9* 1.3* 2.0* 3.6*  NEUTROABS 0.1* 0.1* 0.3* 1.5*  HGB 10.6* 9.8* 8.5* 8.4*  HCT 31.4* 28.9* 25.7* 25.4*  MCV 86.5 87.3 88.3 89.1  PLT 99* 87* 66* 78*   Cardiac Enzymes: No results found for this basename: CKTOTAL, CKMB, CKMBINDEX, TROPONINI,  in the last 168 hours BNP: No components found with this basename: POCBNP,  CBG: No results found for this  basename: GLUCAP,  in the last 168 hours  Recent Results (from the past 240 hour(s))  CULTURE, BLOOD (ROUTINE X 2)     Status: None   Collection Time    05/19/13  8:30 PM      Result Value Range Status   Specimen Description BLOOD L HAND   Final   Special Requests NONE BOTTLES DRAWN AEROBIC AND ANAEROBIC 5CC   Final   Culture  Setup Time     Final   Value: 05/20/2013 03:15     Performed at [facility]   Culture     Final   Value:        BLOOD CULTURE RECEIVED NO GROWTH TO DATE CULTURE WILL BE HELD FOR 5 DAYS BEFORE ISSUING A FINAL NEGATIVE REPORT     Performed at [facility]   Report Status PENDING   Incomplete  CULTURE, BLOOD (ROUTINE X 2)     Status: None   Collection Time    05/19/13  8:30 PM      Result Value Range Status    Specimen Description BLOOD R HAND   Final   Special Requests NONE BOTTLES DRAWN AEROBIC AND ANAEROBIC 3CC   Final   Culture  Setup Time     Final   Value: 05/20/2013 03:15     Performed at [facility]   Culture     Final   Value:        BLOOD CULTURE RECEIVED NO GROWTH TO DATE CULTURE WILL BE HELD FOR 5 DAYS BEFORE ISSUING A FINAL NEGATIVE REPORT     Performed at [facility]   Report Status PENDING   Incomplete  URINE CULTURE     Status: None   Collection Time    05/19/13  9:12 PM      Result Value Range Status   Specimen Description URINE, CLEAN CATCH   Final   Special Requests NONE   Final   Culture  Setup Time     Final   Value: 05/20/2013 05:41     Performed at [facility] Count     Final   Value: NO GROWTH     Performed at [facility]   Culture     Final   Value: NO GROWTH     Performed at [facility]   Report Status 05/21/2013 FINAL   Final     Scheduled Meds: . simvastatin  40 mg Oral QPM   Continuous Infusions:    Torin Whisner, DO  [facility] Pager [phone]  If 7PM-7AM, please contact night-coverage [email or web address] Password [email or web address] 05/22/2013, 12:20 PM   LOS: 3 days  \

## 2013-05-23 ENCOUNTER — Inpatient Hospital Stay (HOSPITAL_COMMUNITY): Payer: Medicare Other

## 2013-05-23 LAB — CBC WITH DIFFERENTIAL/PLATELET
Basophils Absolute: 0.1 10*3/uL (ref 0.0–0.1)
Basophils Relative: 1 % (ref 0–1)
Eosinophils Absolute: 0.1 10*3/uL (ref 0.0–0.7)
HCT: 25.5 % — ABNORMAL LOW (ref 36.0–46.0)
Hemoglobin: 8.7 g/dL — ABNORMAL LOW (ref 12.0–15.0)
Lymphocytes Relative: 25 % (ref 12–46)
Lymphs Abs: 2.7 10*3/uL (ref 0.7–4.0)
MCH: 30.1 pg (ref 26.0–34.0)
MCHC: 34.1 g/dL (ref 30.0–36.0)
MCV: 88.2 fL (ref 78.0–100.0)
Neutro Abs: 6.5 10*3/uL (ref 1.7–7.7)
RDW: 12.6 % (ref 11.5–15.5)

## 2013-05-23 MED ORDER — HEPARIN SOD (PORK) LOCK FLUSH 100 UNIT/ML IV SOLN
500.0000 [IU] | INTRAVENOUS | Status: AC | PRN
  Administered 2013-05-23: 500 [IU]

## 2013-05-26 LAB — CULTURE, BLOOD (ROUTINE X 2)
Culture: NO GROWTH
Culture: NO GROWTH

## 2013-05-27 ENCOUNTER — Ambulatory Visit (HOSPITAL_BASED_OUTPATIENT_CLINIC_OR_DEPARTMENT_OTHER): Payer: Medicare Other | Admitting: Adult Health

## 2013-05-27 ENCOUNTER — Ambulatory Visit (HOSPITAL_BASED_OUTPATIENT_CLINIC_OR_DEPARTMENT_OTHER): Payer: Medicare Other

## 2013-05-27 ENCOUNTER — Other Ambulatory Visit (HOSPITAL_BASED_OUTPATIENT_CLINIC_OR_DEPARTMENT_OTHER): Payer: Medicare Other | Admitting: Lab

## 2013-05-27 ENCOUNTER — Encounter: Payer: Self-pay | Admitting: Adult Health

## 2013-05-27 ENCOUNTER — Telehealth: Payer: Self-pay | Admitting: Oncology

## 2013-05-27 VITALS — BP 150/69 | HR 71 | Temp 98.5°F | Resp 20 | Ht <= 58 in | Wt 108.9 lb

## 2013-05-27 DIAGNOSIS — C50119 Malignant neoplasm of central portion of unspecified female breast: Secondary | ICD-10-CM

## 2013-05-27 DIAGNOSIS — C50912 Malignant neoplasm of unspecified site of left female breast: Secondary | ICD-10-CM

## 2013-05-27 DIAGNOSIS — C50112 Malignant neoplasm of central portion of left female breast: Secondary | ICD-10-CM

## 2013-05-27 DIAGNOSIS — Z5111 Encounter for antineoplastic chemotherapy: Secondary | ICD-10-CM

## 2013-05-27 LAB — CBC WITH DIFFERENTIAL/PLATELET
Basophils Absolute: 0.1 10*3/uL (ref 0.0–0.1)
EOS%: 0.5 % (ref 0.0–7.0)
Eosinophils Absolute: 0.1 10*3/uL (ref 0.0–0.5)
HGB: 9.2 g/dL — ABNORMAL LOW (ref 11.6–15.9)
LYMPH%: 16.7 % (ref 14.0–49.7)
MCH: 29.4 pg (ref 25.1–34.0)
MCV: 90.4 fL (ref 79.5–101.0)
MONO%: 8.4 % (ref 0.0–14.0)
NEUT#: 12.8 10*3/uL — ABNORMAL HIGH (ref 1.5–6.5)
Platelets: 349 10*3/uL (ref 145–400)
RBC: 3.13 10*6/uL — ABNORMAL LOW (ref 3.70–5.45)
RDW: 13.1 % (ref 11.2–14.5)

## 2013-05-27 LAB — COMPREHENSIVE METABOLIC PANEL (CC13)
AST: 24 U/L (ref 5–34)
Alkaline Phosphatase: 100 U/L (ref 40–150)
BUN: 10.4 mg/dL (ref 7.0–26.0)
Glucose: 111 mg/dl (ref 70–140)
Total Bilirubin: <0.2 mg/dL (ref 0.20–1.20)

## 2013-05-27 MED ORDER — DEXAMETHASONE SODIUM PHOSPHATE 20 MG/5ML IJ SOLN
INTRAMUSCULAR | Status: AC
  Filled 2013-05-27: qty 5

## 2013-05-27 MED ORDER — DEXAMETHASONE SODIUM PHOSPHATE 20 MG/5ML IJ SOLN
12.0000 mg | Freq: Once | INTRAMUSCULAR | Status: AC
  Administered 2013-05-27: 12 mg via INTRAVENOUS

## 2013-05-27 MED ORDER — SODIUM CHLORIDE 0.9 % IV SOLN
600.0000 mg/m2 | Freq: Once | INTRAVENOUS | Status: AC
  Administered 2013-05-27: 860 mg via INTRAVENOUS
  Filled 2013-05-27: qty 43

## 2013-05-27 MED ORDER — SODIUM CHLORIDE 0.9 % IV SOLN
150.0000 mg | Freq: Once | INTRAVENOUS | Status: AC
  Administered 2013-05-27: 150 mg via INTRAVENOUS
  Filled 2013-05-27: qty 5

## 2013-05-27 MED ORDER — PALONOSETRON HCL INJECTION 0.25 MG/5ML
INTRAVENOUS | Status: AC
  Filled 2013-05-27: qty 5

## 2013-05-27 MED ORDER — SODIUM CHLORIDE 0.9 % IV SOLN
Freq: Once | INTRAVENOUS | Status: AC
  Administered 2013-05-27: 11:00:00 via INTRAVENOUS

## 2013-05-27 MED ORDER — PALONOSETRON HCL INJECTION 0.25 MG/5ML
0.2500 mg | Freq: Once | INTRAVENOUS | Status: AC
  Administered 2013-05-27: 0.25 mg via INTRAVENOUS

## 2013-05-27 MED ORDER — DOXORUBICIN HCL CHEMO IV INJECTION 2 MG/ML
60.0000 mg/m2 | Freq: Once | INTRAVENOUS | Status: AC
  Administered 2013-05-27: 86 mg via INTRAVENOUS
  Filled 2013-05-27: qty 43

## 2013-05-27 MED ORDER — SODIUM CHLORIDE 0.9 % IJ SOLN
10.0000 mL | INTRAMUSCULAR | Status: DC | PRN
  Administered 2013-05-27: 10 mL
  Filled 2013-05-27: qty 10

## 2013-05-27 MED ORDER — HEPARIN SOD (PORK) LOCK FLUSH 100 UNIT/ML IV SOLN
500.0000 [IU] | Freq: Once | INTRAVENOUS | Status: AC | PRN
  Administered 2013-05-27: 500 [IU]
  Filled 2013-05-27: qty 5

## 2013-05-27 NOTE — Progress Notes (Addendum)
OFFICE PROGRESS NOTE  CC**  [name], MD [address] [place] [address]  DIAGNOSIS: 67 year old female with new diagnosis of clinical stage IIA ER neg, PR neg, HER-2/neu neg, invasive ductal carcinoma of the left breast.   PRIOR THERAPY:  1.  Patient went for her annual screening mammogram in July 2014. She was noted to have a mass at the 12:00 position. She had diagnostic mammogram and ultrasound performed of the left breast. The ultrasound showed a 1.8 cm mass in the 12:00 position of the left breast. MRI was also performed that showed a 2.4 cm area of concern in the 12:00 position of the left breast. There were no abnormal appearing axillary subpectoral or internal mammary lymph nodes. Patient's biopsy performed on 04/15/2013 revealed invasive carcinoma with papillary features consistent with a ductal phenotype. Tumor grade was immediate to high grade. It was ER negative PR negative with a proliferation marker Ki-67 47%. HER-2/neu by cish showed no amplification.  2. Neoadjuvant AC started on 05/13/13.     CURRENT THERAPY: Adriamycin Cytoxan cycle 2 day 1  INTERVAL HISTORY: Ellen Beltran 67 y.o. female returns for evaluation prior to her second cycle of Adriamycin/Cytoxan.  Her first cycle was complicated by febrile neutropenia requiring a 4 day hospital stay.  Her labs recovered and nothing grew from blood cultures or urine culture.  She is feeling well today.  She denies fevers, chills, nausea, vomiting, constipation, diarrhea, numbness, pain.  She does have scalp tenderness that started this week and has noticed her hair is beginning to fall out.  Otherwise, she is w/o questions, concerns.   MEDICAL HISTORY: Past Medical History  Diagnosis Date  . Hypertension   . Hyperlipidemia   . Osteopenia   . Arthritis   . Hot flashes   . Cancer     left breast    ALLERGIES:  is allergic to codeine; morphine and related; and sulfa antibiotics.  MEDICATIONS:  Current  Outpatient Prescriptions  Medication Sig Dispense Refill  . dexamethasone (DECADRON) 4 MG tablet Take 8 mg by mouth 2 (two) times daily. BID x 3 days after chemo      . lidocaine-prilocaine (EMLA) cream Apply topically as needed.  30 g  6  . LORazepam (ATIVAN) 0.5 MG tablet Take 1 tablet (0.5 mg total) by mouth every 6 (six) hours as needed (Nausea or vomiting).  30 tablet  0  . ondansetron (ZOFRAN) 8 MG tablet Take 1 tablet (8 mg total) by mouth 2 (two) times daily as needed. Take two times a day as needed for nausea or vomiting starting on the third day after chemotherapy.  30 tablet  1  . PRESCRIPTION MEDICATION Chemotherapy at [facility].      Marland Kitchen prochlorperazine (COMPAZINE) 10 MG tablet Take 1 tablet (10 mg total) by mouth every 6 (six) hours as needed (Nausea or vomiting).  30 tablet  1  . simvastatin (ZOCOR) 40 MG tablet Take 40 mg by mouth every evening.       No current facility-administered medications for this visit.    SURGICAL HISTORY:  Past Surgical History  Procedure Laterality Date  . Cardiac catheterization  2004  . Tubal ligation  age 42  . Total abdominal hysterectomy  age 41 or 5  . Breast biopsy Left aug 2014  . Portacath placement N/A 05/02/2013    Procedure: INSERTION PORT-A-CATH;  Surgeon: Kandis Cocking, MD;  Location: [facility];  Service: General;  Laterality: N/A;    REVIEW OF  SYSTEMS:  A 10 point review of systems was conducted and is otherwise negative except for what is noted above.    PHYSICAL EXAMINATION: Blood pressure 150/69, pulse 71, temperature 98.5 F (36.9 C), temperature source Oral, resp. rate 20, height 4\' 10"  (1.473 m), weight 108 lb 14.4 oz (49.397 kg). Body mass index is 22.77 kg/(m^2). General: Patient is a well appearing female in no acute distress HEENT: PERRLA, sclerae anicteric no conjunctival pallor, MMM Neck: supple, no palpable adenopathy Lungs: clear to auscultation bilaterally, no wheezes, rhonchi, or rales Cardiovascular: regular rate  rhythm, S1, S2, no murmurs, rubs or gallops Abdomen: Soft, non-tender, non-distended, normoactive bowel sounds, no HSM Extremities: warm and well perfused, no clubbing, cyanosis, or edema Skin: No rashes or lesions Neuro: Non-focal Left breast soft palpable mass, right breast no masses or nodules ECOG PERFORMANCE STATUS: 1 - Symptomatic but completely ambulatory  LABORATORY DATA: Lab Results  Component Value Date   WBC 17.3* 05/27/2013   HGB 9.2* 05/27/2013   HCT 28.3* 05/27/2013   MCV 90.4 05/27/2013   PLT 349 05/27/2013      Chemistry      Component Value Date/Time   NA 143 05/27/2013 0900   NA 137 05/22/2013 0246   K 3.8 05/27/2013 0900   K 4.2 05/22/2013 0246   CL 108 05/22/2013 0246   CO2 26 05/27/2013 0900   CO2 23 05/22/2013 0246   BUN 10.4 05/27/2013 0900   BUN 13 05/22/2013 0246   CREATININE 0.7 05/27/2013 0900   CREATININE 0.62 05/22/2013 0246      Component Value Date/Time   CALCIUM 9.3 05/27/2013 0900   CALCIUM 8.6 05/22/2013 0246   ALKPHOS 100 05/27/2013 0900   ALKPHOS 75 05/19/2013 1856   AST 24 05/27/2013 0900   AST 8 05/19/2013 1856   ALT 56* 05/27/2013 0900   ALT 11 05/19/2013 1856   BILITOT <0.20 05/27/2013 0900   BILITOT 0.7 05/19/2013 1856       RADIOGRAPHIC STUDIES:  Dg Chest 2 View  05/19/2013   *RADIOLOGY REPORT*  Clinical Data: Neutropenic fever.  CHEST - 2 VIEW  Comparison: May 02, 2013  Findings: There is no focal infiltrate, pulmonary edema, or pleural effusion.  The mediastinal contour and cardiac silhouette are stable.  Right central venous line is unchanged.  The soft tissues and osseous structures are stable.  IMPRESSION: No focal pneumonia.   Original Report Authenticated By: Sherian Rein, M.D.   Dg Chest 2 View  04/29/2013   *RADIOLOGY REPORT*  Clinical Data: Hypertension  CHEST - 2 VIEW  Comparison: None.  Findings: Cardiomediastinal silhouette appears normal.  No acute pulmonary disease is noted.  Bony thorax is intact.  Mild dextroscoliosis of thoracic spine is  noted.  Mild hyperexpansion of the lungs is noted.  IMPRESSION: No acute cardiopulmonary abnormality seen.   Original Report Authenticated By: Lupita Raider.,  M.D.   Mr Elbow Right Wo Contrast  05/23/2013   *RADIOLOGY REPORT*  Clinical Data:  Focal area of right elbow pain.  MRI OF THE RIGHT ELBOW WITHOUT CONTRAST  Technique: Multiplanar, multisequence MR imaging of the rightelbow was performed. No intravenous contrast was administered.  Comparison: None  FINDINGS:  TENDONS Common forearm flexor origin: Minimal tendinopathy. Common forearm extensor origin:  Mild tendinopathy. Biceps: Minimal distal tendinopathy.  No tear. Triceps:  Normal.  LIGAMENTS Medial stabilizers: Intact. Lateral stabilizers: Intact.  Cartilage: Mild degenerative changes but no cartilage defects or osteochondral lesions. Joint:  No joint effusion or loose bodies.  Cubital tunnel: Normal. Bones:  No acute bony findings.  Muscles:  There is a focal area of edema like signal abnormality in the extensor carpi radialis muscle.  This is in the location of the patient's palpable abnormality and is most likely a focal muscle tear or possibly a nonspecific myositis.  No muscle mass.  IMPRESSION:  1.  Focal area of edema like signal abnormality in the extensor carpi radialis muscle most likely representing a focal muscle tear or nonspecific myositis. 2.  Mild elbow joint generative changes. 3.  Minimal common flexor and extensor tendinopathy.   Original Report Authenticated By: Rudie Meyer, M.D.   Dg Chest Port 1 View  05/02/2013   *RADIOLOGY REPORT*  Clinical Data: Port-A-Cath placement.  New diagnosis of breast cancer.  PORTABLE CHEST - 1 VIEW  Comparison: 04/29/2013  Findings: Power port has been inserted.  The tip is at the cavoatrial junction.  The heart size and pulmonary vascularity are normal and the lungs are clear.  No pneumothorax.  Slight thoracic scoliosis.  IMPRESSION: Power port in good position.  No pneumothorax.   Original Report  Authenticated By: Francene Boyers, M.D.   Dg C-arm 1-60 Min-no Report  05/02/2013   CLINICAL DATA: port-a-cath   C-ARM 1-60 MINUTES  Fluoroscopy was utilized by the requesting physician.  No radiographic  interpretation.     ASSESSMENT: 67 year old female with   #1 new diagnosis on a screening mammogram of the left breast mass at the 12:00 position. By ultrasound measured 1.8 cm by MRI measuring 2.4 cm. Biopsy of the 12:00 mass revealed an invasive ductal carcinoma intermediate to high-grade. Prognostic markers revealed estrogen receptor negative progesterone receptor negative HER-2/neu-negative with elevated Ki-67 of 47%. Patient has a triple-negative disease. Clinical stage II ( T2 NX MX).   #2  Patient will undergo neoadjuvant Adriamycin Cytoxan given dose dense x4 cycles followed by Taxol and carboplatinum given weekly for 12 weeks. She will then have surgery, and be referred for adjuvant radiation therapy.    PLAN:  1.  Following her first cycle of Adriamycin/Cytoxan Ms. Marchiano developed febrile neutropenia that required a four day hospitalization.  In review of her lab results and testing, no infectious etiology was pinpointed.  Her labs recovered, and she was discharged.  We discussed that we will give her the second cycle of Adriamycin/Cytoxan, and to call us immediately if she begins to feel poorly again so we can see her in the office and give her hydration and antibiotics if needed.  Should she again require hospitalization, we will likely need to switch her chemotherapy regimen.    2.  She will return tomorrow for neulasta and next week for labs and an appointment for evaluation of chemo toxicities.    All questions were answered. The patient knows to call the clinic with any problems, questions or concerns. We can certainly see the patient much sooner if necessary.  I spent 25 minutes counseling the patient face to face. The total time spent in the appointment was 30  minutes.  Cherie Ouch Lyn Hollingshead, NP Medical Oncology [facility] Phone: [phone] 05/28/2013, 10:26 AM    ATTENDING'S ATTESTATION:  I personally reviewed patient's chart, examined patient myself, formulated the treatment plan as followed.    Patient was admitted after cycle 1 of Adriamycin and Cytoxan to 2 febrile neutropenia. We did not find a source for her infection. She is now seen for cycle 2 of Adriamycin and Cytoxan. She will proceed with her scheduled  chemotherapy since she does look well. However we will continue to monitor her very quickly we will give her antibiotics and IV fluids as needed. She knows to call us.  Drue Second, MD Medical/Oncology [facility] [phone] (beeper) [phone] (Office)  06/05/2013, 11:08 PM

## 2013-05-27 NOTE — Patient Instructions (Signed)
Doing well.  Proceed with chemotherapy.  Please call us if you have any questions or concerns.    

## 2013-05-27 NOTE — Patient Instructions (Addendum)
[facility] Discharge Instructions for Patients Receiving Chemotherapy  Today you received the following chemotherapy agents Adriamycin and Cytoxan.  To help prevent nausea and vomiting after your treatment, we encourage you to take your nausea medication as prescribed.   If you develop nausea and vomiting that is not controlled by your nausea medication, call the clinic.   BELOW ARE SYMPTOMS THAT SHOULD BE REPORTED IMMEDIATELY:  *FEVER GREATER THAN 100.5 F  *CHILLS WITH OR WITHOUT FEVER  NAUSEA AND VOMITING THAT IS NOT CONTROLLED WITH YOUR NAUSEA MEDICATION  *UNUSUAL SHORTNESS OF BREATH  *UNUSUAL BRUISING OR BLEEDING  TENDERNESS IN MOUTH AND THROAT WITH OR WITHOUT PRESENCE OF ULCERS  *URINARY PROBLEMS  *BOWEL PROBLEMS  UNUSUAL RASH Items with * indicate a potential emergency and should be followed up as soon as possible.  Feel free to call the clinic you have any questions or concerns. The clinic phone number is [phone].    

## 2013-05-27 NOTE — Telephone Encounter (Signed)
, °

## 2013-05-28 ENCOUNTER — Ambulatory Visit (HOSPITAL_BASED_OUTPATIENT_CLINIC_OR_DEPARTMENT_OTHER): Payer: Medicare Other

## 2013-05-28 VITALS — BP 125/47 | HR 82 | Temp 96.8°F

## 2013-05-28 DIAGNOSIS — C50112 Malignant neoplasm of central portion of left female breast: Secondary | ICD-10-CM

## 2013-05-28 DIAGNOSIS — Z5189 Encounter for other specified aftercare: Secondary | ICD-10-CM

## 2013-05-28 DIAGNOSIS — C50919 Malignant neoplasm of unspecified site of unspecified female breast: Secondary | ICD-10-CM

## 2013-05-28 MED ORDER — PEGFILGRASTIM INJECTION 6 MG/0.6ML
6.0000 mg | Freq: Once | SUBCUTANEOUS | Status: AC
  Administered 2013-05-28: 6 mg via SUBCUTANEOUS

## 2013-05-29 NOTE — Progress Notes (Signed)
OFFICE PROGRESS NOTE  CC**  [name], MD [address] [place] [address] Dr. Lonie Peak  Dr. Emelia Loron  DIAGNOSIS: 67 year old female with new diagnosis of T2 NX MX invasive ductal carcinoma of the left breast. Patient is seen in the multidisciplinary breast clinic   STAGE:  Cancer of central portion of female breast  Primary site: Breast (Left)  Staging method: AJCC 7th Edition  Clinical: Stage IIA (T2, N0, cM0)  Summary: Stage IIA (T2, N0, cM0)  PRIOR THERAPY: #1Patient went for her annual screening mammogram in July 2014. She was noted to have a look mass at the 12:00 position. She had diagnostic mammogram and ultrasound performed of the left breast. The ultrasound showed a 1.8 cm mass in the 12:00 position of the left breast. MRI was also performed that showed a 2.4 cm area of concern in the 12:00 position of the left breast. There were no abnormal appearing axillary subpectoral or internal mammary lymph nodes. Patient's biopsy performed on 04/15/2013 revealed invasive carcinoma with papillary features consistent with a ductal phenotype. Tumor grade was immediate to high grade. It was ER negative PR negative with a proliferation marker Ki-67 47%. HER-2/neu by cish showed no amplification  #2 at the multidisciplinary breast clinic patient was recommended neoadjuvant chemotherapy to 2 sites of tumor and her wish to have breast conservation. We recommended Adriamycin Cytoxan dose dense x4 cycles followed by Taxol carboplatinum weekly for 12 weeks.  #3 patient will proceed with neoadjuvant Adriamycin and Cytoxan starting 05/13/2013 for a total of 4 cycles.  CURRENT THERAPY: cycle 1 day 1 of Adriamycin and Cytoxan  INTERVAL HISTORY: Ellen Beltran 67 y.o. female returns for followup visit prior to her chemotherapy. Overall she is feeling well. She had her Port-A-Cath placed she also has had chemotherapy teaching class. She also had her echocardiogram performed  all of the studies are reviewed. She is accompanied by her husband today. She feels well. She denies any headaches double vision blurring of vision no sinus infections no difficulty in swallowing no shortness of breath no cough hemoptysis or hematemesis no abdominal pain. She denies any peripheral paresthesias no myalgias and arthralgias. She is a bit anxious to get started. Remainder of the 10 point review of systems is negative.  MEDICAL HISTORY: Past Medical History  Diagnosis Date  . Hypertension   . Hyperlipidemia   . Osteopenia   . Arthritis   . Hot flashes   . Cancer     left breast    ALLERGIES:  is allergic to codeine; morphine and related; and sulfa antibiotics.  MEDICATIONS:  Current Outpatient Prescriptions  Medication Sig Dispense Refill  . lidocaine-prilocaine (EMLA) cream Apply topically as needed.  30 g  6  . simvastatin (ZOCOR) 40 MG tablet Take 40 mg by mouth every evening.      Marland Kitchen dexamethasone (DECADRON) 4 MG tablet Take 8 mg by mouth 2 (two) times daily. BID x 3 days after chemo      . LORazepam (ATIVAN) 0.5 MG tablet Take 1 tablet (0.5 mg total) by mouth every 6 (six) hours as needed (Nausea or vomiting).  30 tablet  0  . ondansetron (ZOFRAN) 8 MG tablet Take 1 tablet (8 mg total) by mouth 2 (two) times daily as needed. Take two times a day as needed for nausea or vomiting starting on the third day after chemotherapy.  30 tablet  1  . PRESCRIPTION MEDICATION Chemotherapy at [facility].      Marland Kitchen prochlorperazine (  COMPAZINE) 10 MG tablet Take 1 tablet (10 mg total) by mouth every 6 (six) hours as needed (Nausea or vomiting).  30 tablet  1   No current facility-administered medications for this visit.    SURGICAL HISTORY:  Past Surgical History  Procedure Laterality Date  . Cardiac catheterization  2004  . Tubal ligation  age 6  . Total abdominal hysterectomy  age 6 or 16  . Breast biopsy Left aug 2014  . Portacath placement N/A 05/02/2013    Procedure: INSERTION  PORT-A-CATH;  Surgeon: Kandis Cocking, MD;  Location: [facility];  Service: General;  Laterality: N/A;    REVIEW OF SYSTEMS:  Pertinent items are noted in HPI.   HEALTH MAINTENANCE:  PHYSICAL EXAMINATION: Blood pressure 142/56, pulse 58, temperature 97.8 F (36.6 C), temperature source Oral, resp. rate 20, height 4\' 10"  (1.473 m), weight 108 lb 9.6 oz (49.261 kg). Body mass index is 22.7 kg/(m^2). ECOG PERFORMANCE STATUS: 0 - Asymptomatic   General appearance: alert, cooperative and appears stated age Neck: no adenopathy, no carotid bruit, no JVD, supple, symmetrical, trachea midline and thyroid not enlarged, symmetric, no tenderness/mass/nodules Lymph nodes: Cervical, supraclavicular, and axillary nodes normal. Resp: clear to auscultation bilaterally Back: symmetric, no curvature. ROM normal. No CVA tenderness. Cardio: regular rate and rhythm, S1, S2 normal, no murmur, click, rub or gallop GI: soft, non-tender; bowel sounds normal; no masses,  no organomegaly Extremities: extremities normal, atraumatic, no cyanosis or edema Neurologic: Grossly normal   LABORATORY DATA: Lab Results  Component Value Date   WBC 17.3* 05/27/2013   HGB 9.2* 05/27/2013   HCT 28.3* 05/27/2013   MCV 90.4 05/27/2013   PLT 349 05/27/2013      Chemistry      Component Value Date/Time   NA 143 05/27/2013 0900   NA 137 05/22/2013 0246   K 3.8 05/27/2013 0900   K 4.2 05/22/2013 0246   CL 108 05/22/2013 0246   CO2 26 05/27/2013 0900   CO2 23 05/22/2013 0246   BUN 10.4 05/27/2013 0900   BUN 13 05/22/2013 0246   CREATININE 0.7 05/27/2013 0900   CREATININE 0.62 05/22/2013 0246      Component Value Date/Time   CALCIUM 9.3 05/27/2013 0900   CALCIUM 8.6 05/22/2013 0246   ALKPHOS 100 05/27/2013 0900   ALKPHOS 75 05/19/2013 1856   AST 24 05/27/2013 0900   AST 8 05/19/2013 1856   ALT 56* 05/27/2013 0900   ALT 11 05/19/2013 1856   BILITOT <0.20 05/27/2013 0900   BILITOT 0.7 05/19/2013 1856       RADIOGRAPHIC STUDIES:  Dg Chest 2  View  05/19/2013   *RADIOLOGY REPORT*  Clinical Data: Neutropenic fever.  CHEST - 2 VIEW  Comparison: May 02, 2013  Findings: There is no focal infiltrate, pulmonary edema, or pleural effusion.  The mediastinal contour and cardiac silhouette are stable.  Right central venous line is unchanged.  The soft tissues and osseous structures are stable.  IMPRESSION: No focal pneumonia.   Original Report Authenticated By: Sherian Rein, M.D.   Dg Chest 2 View  04/29/2013   *RADIOLOGY REPORT*  Clinical Data: Hypertension  CHEST - 2 VIEW  Comparison: None.  Findings: Cardiomediastinal silhouette appears normal.  No acute pulmonary disease is noted.  Bony thorax is intact.  Mild dextroscoliosis of thoracic spine is noted.  Mild hyperexpansion of the lungs is noted.  IMPRESSION: No acute cardiopulmonary abnormality seen.   Original Report Authenticated By: Lupita Raider.,  M.D.  Mr Elbow Right Wo Contrast  05/23/2013   *RADIOLOGY REPORT*  Clinical Data:  Focal area of right elbow pain.  MRI OF THE RIGHT ELBOW WITHOUT CONTRAST  Technique: Multiplanar, multisequence MR imaging of the rightelbow was performed. No intravenous contrast was administered.  Comparison: None  FINDINGS:  TENDONS Common forearm flexor origin: Minimal tendinopathy. Common forearm extensor origin:  Mild tendinopathy. Biceps: Minimal distal tendinopathy.  No tear. Triceps:  Normal.  LIGAMENTS Medial stabilizers: Intact. Lateral stabilizers: Intact.  Cartilage: Mild degenerative changes but no cartilage defects or osteochondral lesions. Joint:  No joint effusion or loose bodies. Cubital tunnel: Normal. Bones:  No acute bony findings.  Muscles:  There is a focal area of edema like signal abnormality in the extensor carpi radialis muscle.  This is in the location of the patient's palpable abnormality and is most likely a focal muscle tear or possibly a nonspecific myositis.  No muscle mass.  IMPRESSION:  1.  Focal area of edema like signal abnormality  in the extensor carpi radialis muscle most likely representing a focal muscle tear or nonspecific myositis. 2.  Mild elbow joint generative changes. 3.  Minimal common flexor and extensor tendinopathy.   Original Report Authenticated By: Rudie Meyer, M.D.   Dg Chest Port 1 View  05/02/2013   *RADIOLOGY REPORT*  Clinical Data: Port-A-Cath placement.  New diagnosis of breast cancer.  PORTABLE CHEST - 1 VIEW  Comparison: 04/29/2013  Findings: Power port has been inserted.  The tip is at the cavoatrial junction.  The heart size and pulmonary vascularity are normal and the lungs are clear.  No pneumothorax.  Slight thoracic scoliosis.  IMPRESSION: Power port in good position.  No pneumothorax.   Original Report Authenticated By: Francene Boyers, M.D.   Dg C-arm 1-60 Min-no Report  05/02/2013   CLINICAL DATA: port-a-cath   C-ARM 1-60 MINUTES  Fluoroscopy was utilized by the requesting physician.  No radiographic  interpretation.     ASSESSMENT: 89-year-old female with  #1 screen detected left breast cancer at the 12:00 position measuring by MRI 2.4 cm. Biopsy at the 12:00 mass revealed a intermediate to high-grade invasive ductal carcinoma estrogen receptor negative progesterone receptor negative HER-2/neu negative with an elevated Ki-67 of 47%. Clinical stage II. Patient was seen at the multidisciplinary breast clinic.   #2 she is here to begin neoadjuvant chemotherapy consisting of Adriamycin and Cytoxan which will be given every 2 weeks with day 2 Neulasta. Risks and benefits and side effects of chemotherapy have been discussed with her very clearly.  #3 for her anti-emetics patient will receive ondansetron prochlorperazine and dexamethasone. I have given her is specific instructions on how to take these drugs.  #4 patient will receive day 2 Neulasta she is instructed to take Claritin and Tylenol prior to coming in for her injection. Rationale for this was also explained to her.   PLAN:   #1  patient will proceed with cycle 1 day 1 of chemotherapy.  #2 she will return tomorrow for Neulasta injection  #3 nausea vomiting: She will take her antiemetics as prescribed.  #4 to prevent aches and pains from Neulasta she is recommended to take Claritin and Tylenol as needed.  #5 she'll be seen back in one week with office visit and labs.   All questions were answered. The patient knows to call the clinic with any problems, questions or concerns. We can certainly see the patient much sooner if necessary.  I spent 30 minutes counseling the patient face to  face. The total time spent in the appointment was 25 minutes.    Drue Second, MD Medical/Oncology [facility] [phone] (beeper) [phone] (Office)

## 2013-06-02 ENCOUNTER — Other Ambulatory Visit: Payer: Self-pay | Admitting: Certified Registered Nurse Anesthetist

## 2013-06-02 ENCOUNTER — Ambulatory Visit (INDEPENDENT_AMBULATORY_CARE_PROVIDER_SITE_OTHER): Payer: Medicare Other | Admitting: Surgery

## 2013-06-02 VITALS — BP 122/72 | HR 76 | Temp 98.0°F | Resp 18 | Ht <= 58 in | Wt 106.0 lb

## 2013-06-02 DIAGNOSIS — C50119 Malignant neoplasm of central portion of unspecified female breast: Secondary | ICD-10-CM

## 2013-06-02 DIAGNOSIS — C50112 Malignant neoplasm of central portion of left female breast: Secondary | ICD-10-CM

## 2013-06-02 NOTE — Progress Notes (Signed)
Re:   Ellen Beltran DOB:   [birth year] MRN:   [id number]  [facility]  ASSESSMENT AND PLAN: 1.  Left breast cancer, T2, N0  IDC with papillary features, ER - 0%, PR - 0%, Her2Neu - neg, Ki67 - 47%  2.4 cm on MRI  Oncology - Welton Flakes and Michell Heinrich  The plan is to do neoadjuvant therapy to help shrink the primary tumor for better margin control.  Plan: 1), 2) Neoadjuvant chemotx, 3) lumpectomy and SLNBx, 4) Radiation tx  She did end up in the hospital after her first treatment.  She has done better since.  She has had 2/20 treatments.  She'll see me back in 2 months.  2.  Power port - 05/02/2013 - D. Jaylenn Baiza  The power port has done well. 3. HTN 4.  Hypercholesterolemia  Chief Complaint  Patient presents with  . Routine Post Op    port cath   REFERRING PHYSICIAN: [name], MD  HISTORY OF PRESENT ILLNESS: Ellen Beltran is a 67 y.o. (DOB: [birth year])  white  female whose primary care physician is [name], MD and comes to the [facility] today for left breast cancer. Her husband is with her. She did end up in the hospital after her first treatment.  She has done better since.  We talked about treatment and plans.  I'll see her every 2 months until she is ready for her lumpectomy.  Breast Cancer History: She went for a routine mammogram 03/30/2013 at [facility].  She had a suspicious area at 12 o'clock in the left breast.  Her last mammogram was 2012. A biopsy 04/15/2013 revealed (Accession: [id number]) triple negative invasive cancer with papillary features. An MRI on 04/25/2013 show a 2.4 x 2.4 x 1.3 cm mass at the 12 o'clock position of the left breast. She is on hormones, which she has stopped.  She has no family history of breast cancer.  She had a hysterectomy in 2002 for benign disease.  Past Medical History  Diagnosis Date  . Hypertension   . Hyperlipidemia   . Osteopenia   . Arthritis   . Hot flashes   . Cancer     left breast     Current Outpatient  Prescriptions  Medication Sig Dispense Refill  . dexamethasone (DECADRON) 4 MG tablet Take 8 mg by mouth 2 (two) times daily. BID x 3 days after chemo      . lidocaine-prilocaine (EMLA) cream Apply topically as needed.  30 g  6  . LORazepam (ATIVAN) 0.5 MG tablet Take 1 tablet (0.5 mg total) by mouth every 6 (six) hours as needed (Nausea or vomiting).  30 tablet  0  . ondansetron (ZOFRAN) 8 MG tablet Take 1 tablet (8 mg total) by mouth 2 (two) times daily as needed. Take two times a day as needed for nausea or vomiting starting on the third day after chemotherapy.  30 tablet  1  . PRESCRIPTION MEDICATION Chemotherapy at [facility].      Marland Kitchen prochlorperazine (COMPAZINE) 10 MG tablet Take 1 tablet (10 mg total) by mouth every 6 (six) hours as needed (Nausea or vomiting).  30 tablet  1  . simvastatin (ZOCOR) 40 MG tablet Take 40 mg by mouth every evening.       No current facility-administered medications for this visit.      Allergies  Allergen Reactions  . Codeine Other (See Comments)    Sees unusual things  . Morphine And Related Nausea Only  .  Sulfa Antibiotics Rash    REVIEW OF SYSTEMS: Cardiac:  Hypertension since about 2004. Gastrointestinal:  No history of stomach disease.  No history of liver disease.  No history of gall bladder disease.  No history of pancreas disease.  Negative colonoscopy by Dr. Marina Goodell 2014. Urologic:  No history of kidney stones.  No history of bladder infections. GYN:  Hysterectomy 2002.  Sees Dr. Jennette Kettle from a Gyn standpoint.  SOCIAL and FAMILY HISTORY: Married. 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 Has 2 daughters - ages 44 and 62. Her mother had lung cancer and needed a porta cath. Her husband had hernia surgery by Dr. Gwinda Passe.  PHYSICAL EXAM: BP 122/72  Pulse 76  Temp(Src) 98 F (36.7 C)  Resp 18  Ht 4' 9.5" (1.461 m)  Wt 106 lb (48.081 kg)  BMI 22.53 kg/m2  General: WN WF who is alert and generally healthy appearing.   Breasts:Right - no obvious mass.  Port in [place] of right breast.  Left - Did not examine  DATA REVIEWED: Epic notes and x-rays.  Ovidio Kin, MD,  [facility] [address]  [address]   [place], [place]    [address] Phone:  [phone] FAX:  [phone]

## 2013-06-03 ENCOUNTER — Ambulatory Visit (HOSPITAL_BASED_OUTPATIENT_CLINIC_OR_DEPARTMENT_OTHER): Payer: Medicare Other

## 2013-06-03 ENCOUNTER — Encounter: Payer: Self-pay | Admitting: Adult Health

## 2013-06-03 ENCOUNTER — Other Ambulatory Visit (HOSPITAL_BASED_OUTPATIENT_CLINIC_OR_DEPARTMENT_OTHER): Payer: Medicare Other

## 2013-06-03 ENCOUNTER — Telehealth: Payer: Self-pay | Admitting: *Deleted

## 2013-06-03 ENCOUNTER — Ambulatory Visit (HOSPITAL_BASED_OUTPATIENT_CLINIC_OR_DEPARTMENT_OTHER): Payer: Medicare Other | Admitting: Adult Health

## 2013-06-03 VITALS — BP 125/76 | HR 92 | Temp 98.2°F | Resp 18 | Wt 104.8 lb

## 2013-06-03 DIAGNOSIS — R1110 Vomiting, unspecified: Secondary | ICD-10-CM

## 2013-06-03 DIAGNOSIS — E860 Dehydration: Secondary | ICD-10-CM

## 2013-06-03 DIAGNOSIS — Z171 Estrogen receptor negative status [ER-]: Secondary | ICD-10-CM

## 2013-06-03 DIAGNOSIS — C50919 Malignant neoplasm of unspecified site of unspecified female breast: Secondary | ICD-10-CM

## 2013-06-03 DIAGNOSIS — R110 Nausea: Secondary | ICD-10-CM

## 2013-06-03 DIAGNOSIS — R5381 Other malaise: Secondary | ICD-10-CM

## 2013-06-03 DIAGNOSIS — C50912 Malignant neoplasm of unspecified site of left female breast: Secondary | ICD-10-CM

## 2013-06-03 DIAGNOSIS — C50112 Malignant neoplasm of central portion of left female breast: Secondary | ICD-10-CM

## 2013-06-03 DIAGNOSIS — D702 Other drug-induced agranulocytosis: Secondary | ICD-10-CM

## 2013-06-03 DIAGNOSIS — R42 Dizziness and giddiness: Secondary | ICD-10-CM

## 2013-06-03 LAB — CBC WITH DIFFERENTIAL/PLATELET
Basophils Absolute: 0 10*3/uL (ref 0.0–0.1)
Eosinophils Absolute: 0 10*3/uL (ref 0.0–0.5)
HCT: 29.5 % — ABNORMAL LOW (ref 34.8–46.6)
HGB: 9.9 g/dL — ABNORMAL LOW (ref 11.6–15.9)
LYMPH%: 62 % — ABNORMAL HIGH (ref 14.0–49.7)
MCH: 29.6 pg (ref 25.1–34.0)
MCV: 88.4 fL (ref 79.5–101.0)
MONO%: 23.7 % — ABNORMAL HIGH (ref 0.0–14.0)
NEUT#: 0.1 10*3/uL — CL (ref 1.5–6.5)
NEUT%: 11.6 % — ABNORMAL LOW (ref 38.4–76.8)
Platelets: 208 10*3/uL (ref 145–400)

## 2013-06-03 LAB — COMPREHENSIVE METABOLIC PANEL (CC13)
Albumin: 3.5 g/dL (ref 3.5–5.0)
Alkaline Phosphatase: 112 U/L (ref 40–150)
BUN: 15.3 mg/dL (ref 7.0–26.0)
Creatinine: 0.7 mg/dL (ref 0.6–1.1)
Glucose: 105 mg/dl (ref 70–140)
Potassium: 4.4 mEq/L (ref 3.5–5.1)
Total Bilirubin: 0.47 mg/dL (ref 0.20–1.20)

## 2013-06-03 MED ORDER — ONDANSETRON 8 MG/50ML IVPB (CHCC)
8.0000 mg | Freq: Once | INTRAVENOUS | Status: AC
  Administered 2013-06-03: 8 mg via INTRAVENOUS

## 2013-06-03 MED ORDER — SODIUM CHLORIDE 0.9 % IV SOLN
Freq: Once | INTRAVENOUS | Status: AC
  Administered 2013-06-03: 15:00:00 via INTRAVENOUS

## 2013-06-03 MED ORDER — CIPROFLOXACIN HCL 500 MG PO TABS: 500.0000 mg | ORAL_TABLET | Freq: Two times a day (BID) | ORAL | Status: DC

## 2013-06-03 MED ORDER — ONDANSETRON 8 MG/NS 50 ML IVPB
INTRAVENOUS | Status: AC
  Filled 2013-06-03: qty 8

## 2013-06-03 NOTE — Telephone Encounter (Signed)
Per staff message and POF I have scheduled appts.  JMW  

## 2013-06-03 NOTE — Progress Notes (Addendum)
OFFICE PROGRESS NOTE  CC**  Ellen Hoit, MD [address] [place] [address]  DIAGNOSIS: 67 year old female with new diagnosis of clinical stage IIA ER neg, PR neg, HER-2/neu neg, invasive ductal carcinoma of the left breast.   PRIOR THERAPY:  1.  Patient went for her annual screening mammogram in July 2014. She was noted to have a mass at the 12:00 position. She had diagnostic mammogram and ultrasound performed of the left breast. The ultrasound showed a 1.8 cm mass in the 12:00 position of the left breast. MRI was also performed that showed a 2.4 cm area of concern in the 12:00 position of the left breast. There were no abnormal appearing axillary subpectoral or internal mammary lymph nodes. Patient's biopsy performed on 04/15/2013 revealed invasive carcinoma with papillary features consistent with a ductal phenotype. Tumor grade was immediate to high grade. It was ER negative PR negative with a proliferation marker Ki-67 47%. HER-2/neu by cish showed no amplification.  2. Neoadjuvant AC started on 05/13/13.     CURRENT THERAPY: Adriamycin Cytoxan cycle 2 day 8  INTERVAL HISTORY: Ellen Beltran 67 y.o. female returns for evaluation following her second cycle of Adriamycin/Cytoxan.  She is not feeling well today.  She has been nauseated, lightheaded, and fatigued since this morning.  She did vomit this morning. She feels a ball inside her stomach that doesn't want to move.  She has a mouth ulcer that formed yesterday.  She has mild constipation relived by prunes.  She denies fevers, chills, diarrhea, numbness, pain, or further concerns.  She is drinking 3 bottles of water per day in addition to other fluids.  Her appetite has been decreased today.  Otherwise, she is w/o questions/concerns.    MEDICAL HISTORY: Past Medical History  Diagnosis Date  . Hypertension   . Hyperlipidemia   . Osteopenia   . Arthritis   . Hot flashes   . Cancer     left breast    ALLERGIES:  is  allergic to codeine; morphine and related; and sulfa antibiotics.  MEDICATIONS:  Current Outpatient Prescriptions  Medication Sig Dispense Refill  . ciprofloxacin (CIPRO) 500 MG tablet Take 1 tablet (500 mg total) by mouth 2 (two) times daily.  14 tablet  0  . dexamethasone (DECADRON) 4 MG tablet Take 8 mg by mouth 2 (two) times daily. BID x 3 days after chemo      . lidocaine-prilocaine (EMLA) cream Apply topically as needed.  30 g  6  . LORazepam (ATIVAN) 0.5 MG tablet Take 1 tablet (0.5 mg total) by mouth every 6 (six) hours as needed (Nausea or vomiting).  30 tablet  0  . ondansetron (ZOFRAN) 8 MG tablet Take 1 tablet (8 mg total) by mouth 2 (two) times daily as needed. Take two times a day as needed for nausea or vomiting starting on the third day after chemotherapy.  30 tablet  1  . PRESCRIPTION MEDICATION Chemotherapy at [facility].      Marland Kitchen prochlorperazine (COMPAZINE) 10 MG tablet Take 1 tablet (10 mg total) by mouth every 6 (six) hours as needed (Nausea or vomiting).  30 tablet  1  . simvastatin (ZOCOR) 40 MG tablet Take 40 mg by mouth every evening.       No current facility-administered medications for this visit.   Facility-Administered Medications Ordered in Other Visits  Medication Dose Route Frequency Provider Last Rate Last Dose  . 0.9 %  sodium chloride infusion   Intravenous Continuous [place]om K  Welton Flakes, MD 500 mL/hr at 06/04/13 0900      SURGICAL HISTORY:  Past Surgical History  Procedure Laterality Date  . Cardiac catheterization  2004  . Tubal ligation  age 39  . Total abdominal hysterectomy  age 42 or 58  . Breast biopsy Left aug 2014  . Portacath placement N/A 05/02/2013    Procedure: INSERTION PORT-A-CATH;  Surgeon: Kandis Cocking, MD;  Location: [facility];  Service: General;  Laterality: N/A;    REVIEW OF SYSTEMS:  A 10 point review of systems was conducted and is otherwise negative except for what is noted above.    PHYSICAL EXAMINATION: Blood pressure 125/76, pulse 92,  temperature 98.2 F (36.8 C), temperature source Oral, resp. rate 18, weight 104 lb 12.8 oz (47.537 kg). Body mass index is 22.27 kg/(m^2). General: Patient is a well appearing female in no acute distress HEENT: PERRLA, sclerae anicteric no conjunctival pallor, MMM Neck: supple, no palpable adenopathy Lungs: clear to auscultation bilaterally, no wheezes, rhonchi, or rales Cardiovascular: regular rate rhythm, S1, S2, no murmurs, rubs or gallops Abdomen: Soft, non-tender, non-distended, normoactive bowel sounds, no HSM Extremities: warm and well perfused, no clubbing, cyanosis, or edema Skin: No rashes or lesions Neuro: Non-focal Left breast soft palpable mass, right breast no masses or nodules ECOG PERFORMANCE STATUS: 1 - Symptomatic but completely ambulatory  LABORATORY DATA: Lab Results  Component Value Date   WBC 1.1* 06/03/2013   HGB 9.9* 06/03/2013   HCT 29.5* 06/03/2013   MCV 88.4 06/03/2013   PLT 208 06/03/2013      Chemistry      Component Value Date/Time   NA 132* 06/03/2013 1246   NA 137 05/22/2013 0246   K 4.4 06/03/2013 1246   K 4.2 05/22/2013 0246   CL 108 05/22/2013 0246   CO2 24 06/03/2013 1246   CO2 23 05/22/2013 0246   BUN 15.3 06/03/2013 1246   BUN 13 05/22/2013 0246   CREATININE 0.7 06/03/2013 1246   CREATININE 0.62 05/22/2013 0246      Component Value Date/Time   CALCIUM 9.8 06/03/2013 1246   CALCIUM 8.6 05/22/2013 0246   ALKPHOS 112 06/03/2013 1246   ALKPHOS 75 05/19/2013 1856   AST 8 06/03/2013 1246   AST 8 05/19/2013 1856   ALT 15 06/03/2013 1246   ALT 11 05/19/2013 1856   BILITOT 0.47 06/03/2013 1246   BILITOT 0.7 05/19/2013 1856       RADIOGRAPHIC STUDIES:  Dg Chest 2 View  05/19/2013   *RADIOLOGY REPORT*  Clinical Data: Neutropenic fever.  CHEST - 2 VIEW  Comparison: May 02, 2013  Findings: There is no focal infiltrate, pulmonary edema, or pleural effusion.  The mediastinal contour and cardiac silhouette are stable.  Right central venous line is unchanged.  The soft  tissues and osseous structures are stable.  IMPRESSION: No focal pneumonia.   Original Report Authenticated By: Sherian Rein, M.D.   Dg Chest 2 View  04/29/2013   *RADIOLOGY REPORT*  Clinical Data: Hypertension  CHEST - 2 VIEW  Comparison: None.  Findings: Cardiomediastinal silhouette appears normal.  No acute pulmonary disease is noted.  Bony thorax is intact.  Mild dextroscoliosis of thoracic spine is noted.  Mild hyperexpansion of the lungs is noted.  IMPRESSION: No acute cardiopulmonary abnormality seen.   Original Report Authenticated By: Lupita Raider.,  M.D.   Mr Elbow Right Wo Contrast  05/23/2013   *RADIOLOGY REPORT*  Clinical Data:  Focal area of right elbow pain.  MRI  OF THE RIGHT ELBOW WITHOUT CONTRAST  Technique: Multiplanar, multisequence MR imaging of the rightelbow was performed. No intravenous contrast was administered.  Comparison: None  FINDINGS:  TENDONS Common forearm flexor origin: Minimal tendinopathy. Common forearm extensor origin:  Mild tendinopathy. Biceps: Minimal distal tendinopathy.  No tear. Triceps:  Normal.  LIGAMENTS Medial stabilizers: Intact. Lateral stabilizers: Intact.  Cartilage: Mild degenerative changes but no cartilage defects or osteochondral lesions. Joint:  No joint effusion or loose bodies. Cubital tunnel: Normal. Bones:  No acute bony findings.  Muscles:  There is a focal area of edema like signal abnormality in the extensor carpi radialis muscle.  This is in the location of the patient's palpable abnormality and is most likely a focal muscle tear or possibly a nonspecific myositis.  No muscle mass.  IMPRESSION:  1.  Focal area of edema like signal abnormality in the extensor carpi radialis muscle most likely representing a focal muscle tear or nonspecific myositis. 2.  Mild elbow joint generative changes. 3.  Minimal common flexor and extensor tendinopathy.   Original Report Authenticated By: Rudie Meyer, M.D.   Dg Chest Port 1 View  05/02/2013   *RADIOLOGY  REPORT*  Clinical Data: Port-A-Cath placement.  New diagnosis of breast cancer.  PORTABLE CHEST - 1 VIEW  Comparison: 04/29/2013  Findings: Power port has been inserted.  The tip is at the cavoatrial junction.  The heart size and pulmonary vascularity are normal and the lungs are clear.  No pneumothorax.  Slight thoracic scoliosis.  IMPRESSION: Power port in good position.  No pneumothorax.   Original Report Authenticated By: Francene Boyers, M.D.   Dg C-arm 1-60 Min-no Report  05/02/2013   CLINICAL DATA: port-a-cath   C-ARM 1-60 MINUTES  Fluoroscopy was utilized by the requesting physician.  No radiographic  interpretation.     ASSESSMENT: 67 year old female with   #1 new diagnosis on a screening mammogram of the left breast mass at the 12:00 position. By ultrasound measured 1.8 cm by MRI measuring 2.4 cm. Biopsy of the 12:00 mass revealed an invasive ductal carcinoma intermediate to high-grade. Prognostic markers revealed estrogen receptor negative progesterone receptor negative HER-2/neu-negative with elevated Ki-67 of 47%. Patient has a triple-negative disease. Clinical stage II ( T2 NX MX).   #2  Patient will undergo neoadjuvant Adriamycin Cytoxan given dose dense x4 cycles followed by Taxol and carboplatinum given weekly for 12 weeks. She will then have surgery, and be referred for adjuvant radiation therapy.    PLAN:  1. Doing well.  She is neutropenic following chemotherapy.  I reviewed precautions with her in detail, and gave her a copy of them in her AVS.  She will start Cipro BID.  I gave her information about cipro in the AVS.    2.  She will receive IV fluids and Zofran today for nausea, and decreased PO intake.    3.  We discussed mouth care, and I prescribed Magic Mouthwash for her to take.  I called it into her pharmacy.    4.  She will return in one week for cycle 3 of chemothearpy.    All questions were answered. The patient knows to call the clinic with any problems, questions  or concerns. We can certainly see the patient much sooner if necessary.  I spent 25 minutes counseling the patient face to face. The total time spent in the appointment was 30 minutes.  Cherie Ouch Lyn Hollingshead, NP Medical Oncology [facility] Phone: [phone] 06/04/2013, 9:15 AM  ATTENDING'S ATTESTATION:  I personally reviewed patient's chart, examined patient myself, formulated the treatment plan as followed.    Patient tolerated  Adriamycin/Cytoxan very well. However she is neutropenic now. We discussed neutropenic precautions. She will also go on Cipro 500 mg daily. She will receive IV fluids as well. She knows to call me with any problems. I did tell her that I was on over the weekend.  Drue Second, MD Medical/Oncology [facility] [phone] (beeper) [phone] (Office)  06/05/2013, 11:47 PM

## 2013-06-03 NOTE — Patient Instructions (Addendum)
Patient Neutropenia Instruction Sheet  Diagnosis: Breast Cancer      Treating Physician: Drue Second, MD  Treatment: 1. Type of chemotherapy: Adriamycin/Cytoxan 2. Date of last treatment: 05/27/13  Last Blood Counts: Lab Results  Component Value Date   WBC 1.1* 06/03/2013   HGB 9.9* 06/03/2013   HCT 29.5* 06/03/2013   MCV 88.4 06/03/2013   PLT 208 06/03/2013   ANC 100     Prophylactic Antibiotics: Cipro 500 mg by mouth twice a day Instructions: 1. Monitor temperature and call if fever  greater than 100.5, chills, shaking chills (rigors) 2. Call Physician on-call at [phone] 3. Give him/her symptoms and list of medications that you are taking and your last blood count.    Ciprofloxacin tablets What is this medicine? CIPROFLOXACIN (sip roe FLOX a sin) is a quinolone antibiotic. It is used to treat certain kinds of bacterial infections. It will not work for colds, flu, or other viral infections. This medicine may be used for other purposes; ask your health care provider or pharmacist if you have questions. What should I tell my health care provider before I take this medicine? They need to know if you have any of these conditions: -heart condition -joint problems -kidney disease -liver disease -myasthenia gravis -seizure disorder -an unusual or allergic reaction to ciprofloxacin, other antibiotics or medicines, foods, dyes, or preservatives -pregnant or trying to get pregnant -breast-feeding How should I use this medicine? Take this medicine by mouth with a glass of water. Follow the directions on the prescription label. Take your medicine at regular intervals. Do not take your medicine more often than directed. Take all of your medicine as directed even if you think your are better. Do not skip doses or stop your medicine early. You can take this medicine with food or on an empty stomach. It can be taken with a meal that contains dairy or calcium, but do not take it alone  with a dairy product, like milk or yogurt or calcium-fortified juice. A special MedGuide will be given to you by the pharmacist with each prescription and refill. Be sure to read this information carefully each time. Talk to your pediatrician regarding the use of this medicine in children. Special care may be needed. Overdosage: If you think you have taken too much of this medicine contact a poison control center or emergency room at once. NOTE: This medicine is only for you. Do not share this medicine with others. What if I miss a dose? If you miss a dose, take it as soon as you can. If it is almost time for your next dose, take only that dose. Do not take double or extra doses. What may interact with this medicine? Do not take this medicine with any of the following medications: -cisapride -droperidol -terfenadine -tizanidine This medicine may also interact with the following medications: -antacids -caffeine -cyclosporin -didanosine (ddI) buffered tablets or powder -medicines for diabetes -medicines for inflammation like ibuprofen, naproxen -methotrexate -multivitamins -omeprazole -phenytoin -probenecid -sucralfate -theophylline -warfarin This list may not describe all possible interactions. Give your health care provider a list of all the medicines, herbs, non-prescription drugs, or dietary supplements you use. Also tell them if you smoke, drink alcohol, or use illegal drugs. Some items may interact with your medicine. What should I watch for while using this medicine? Tell your doctor or health care professional if your symptoms do not improve. Do not treat diarrhea with over the counter products. Contact your doctor if you have diarrhea that  lasts more than 2 days or if it is severe and watery. You may get drowsy or dizzy. Do not drive, use machinery, or do anything that needs mental alertness until you know how this medicine affects you. Do not stand or sit up quickly,  especially if you are an older patient. This reduces the risk of dizzy or fainting spells. This medicine can make you more sensitive to the sun. Keep out of the sun. If you cannot avoid being in the sun, wear protective clothing and use sunscreen. Do not use sun lamps or tanning beds/booths. Avoid antacids, aluminum, calcium, iron, magnesium, and zinc products for 6 hours before and 2 hours after taking a dose of this medicine. What side effects may I notice from receiving this medicine? Side effects that you should report to your doctor or health care professional as soon as possible: -allergic reactions like skin rash, itching or hives, swelling of the face, lips, or tongue -breathing problems -confusion, nightmares or hallucinations -feeling faint or lightheaded, falls -irregular heartbeat -joint, muscle or tendon pain or swelling -pain or trouble passing urine -redness, blistering, peeling or loosening of the skin, including inside the mouth -seizure -unusual pain, numbness, tingling, or weakness Side effects that usually do not require medical attention (report to your doctor or health care professional if they continue or are bothersome): -diarrhea -nausea or stomach upset -white patches or sores in the mouth This list may not describe all possible side effects. Call your doctor for medical advice about side effects. You may report side effects to FDA at 1-800-FDA-1088. Where should I keep my medicine? Keep out of the reach of children. Store at room temperature below 30 degrees C (86 degrees F). Keep container tightly closed. Throw away any unused medicine after the expiration date. NOTE: This sheet is a summary. It may not cover all possible information. If you have questions about this medicine, talk to your doctor, pharmacist, or health care provider.  2012, Elsevier/Gold Standard. (11/19/2009 11:41:11 AM)

## 2013-06-03 NOTE — Progress Notes (Signed)
Patient vital signs stable post IVFs and IV Zofran; ambulates well and reports no nausea

## 2013-06-04 ENCOUNTER — Ambulatory Visit (HOSPITAL_BASED_OUTPATIENT_CLINIC_OR_DEPARTMENT_OTHER): Payer: Medicare Other

## 2013-06-04 VITALS — BP 126/53 | HR 90 | Temp 98.0°F | Resp 18

## 2013-06-04 DIAGNOSIS — E860 Dehydration: Secondary | ICD-10-CM

## 2013-06-04 DIAGNOSIS — C50112 Malignant neoplasm of central portion of left female breast: Secondary | ICD-10-CM

## 2013-06-04 DIAGNOSIS — C50919 Malignant neoplasm of unspecified site of unspecified female breast: Secondary | ICD-10-CM

## 2013-06-04 MED ORDER — SODIUM CHLORIDE 0.9 % IV SOLN
INTRAVENOUS | Status: DC
  Administered 2013-06-04: 09:00:00 via INTRAVENOUS

## 2013-06-04 MED ORDER — HEPARIN SOD (PORK) LOCK FLUSH 100 UNIT/ML IV SOLN
500.0000 [IU] | Freq: Once | INTRAVENOUS | Status: AC
  Administered 2013-06-04: 500 [IU] via INTRAVENOUS
  Filled 2013-06-04: qty 5

## 2013-06-04 MED ORDER — SODIUM CHLORIDE 0.9 % IJ SOLN
10.0000 mL | INTRAMUSCULAR | Status: DC | PRN
  Administered 2013-06-04: 10 mL via INTRAVENOUS
  Filled 2013-06-04: qty 10

## 2013-06-04 NOTE — Patient Instructions (Addendum)
Dehydration, Adult  Dehydration means your body does not have as much fluid as it needs. Your kidneys, brain, and heart will not work properly without the right amount of fluids and salt.   HOME CARE   Ask your doctor how to replace body fluid losses (rehydrate).   Drink enough fluids to keep your pee (urine) clear or pale yellow.   Drink small amounts of fluids often if you feel sick to your stomach (nauseous) or throw up (vomit).   Eat like you normally do.   Avoid:   Foods or drinks high in sugar.   Bubbly (carbonated) drinks.   Juice.   Very hot or cold fluids.   Drinks with caffeine.   Fatty, greasy foods.   Alcohol.   Tobacco.   Eating too much.   Gelatin desserts.   Wash your hands to avoid spreading germs (bacteria, viruses).   Only take medicine as told by your doctor.   Keep all doctor visits as told.  GET HELP RIGHT AWAY IF:    You cannot drink something without throwing up.   You get worse even with treatment.   Your vomit has blood in it or looks greenish.   Your poop (stool) has blood in it or looks black and tarry.   You have not peed in 6 to 8 hours.   You pee a small amount of very dark pee.   You have a fever.   You pass out (faint).   You have belly (abdominal) pain that gets worse or stays in one spot (localizes).   You have a rash, stiff neck, or bad headache.   You get easily annoyed, sleepy, or are hard to wake up.   You feel weak, dizzy, or very thirsty.  MAKE SURE YOU:    Understand these instructions.   Will watch your condition.   Will get help right away if you are not doing well or get worse.  Document Released: 06/28/2009 Document Revised: 11/24/2011 Document Reviewed: 04/21/2011  ExitCare Patient Information 2014 [employer].

## 2013-06-10 ENCOUNTER — Other Ambulatory Visit (HOSPITAL_BASED_OUTPATIENT_CLINIC_OR_DEPARTMENT_OTHER): Payer: Medicare Other | Admitting: Lab

## 2013-06-10 ENCOUNTER — Ambulatory Visit (HOSPITAL_BASED_OUTPATIENT_CLINIC_OR_DEPARTMENT_OTHER): Payer: Medicare Other

## 2013-06-10 ENCOUNTER — Encounter: Payer: Self-pay | Admitting: Oncology

## 2013-06-10 ENCOUNTER — Ambulatory Visit (HOSPITAL_BASED_OUTPATIENT_CLINIC_OR_DEPARTMENT_OTHER): Payer: Medicare Other | Admitting: Oncology

## 2013-06-10 ENCOUNTER — Telehealth: Payer: Self-pay | Admitting: *Deleted

## 2013-06-10 ENCOUNTER — Telehealth: Payer: Self-pay | Admitting: Oncology

## 2013-06-10 VITALS — BP 136/79 | HR 81 | Temp 98.2°F | Resp 20 | Ht <= 58 in | Wt 106.6 lb

## 2013-06-10 DIAGNOSIS — C50919 Malignant neoplasm of unspecified site of unspecified female breast: Secondary | ICD-10-CM

## 2013-06-10 DIAGNOSIS — C50912 Malignant neoplasm of unspecified site of left female breast: Secondary | ICD-10-CM

## 2013-06-10 DIAGNOSIS — Z171 Estrogen receptor negative status [ER-]: Secondary | ICD-10-CM

## 2013-06-10 DIAGNOSIS — C50112 Malignant neoplasm of central portion of left female breast: Secondary | ICD-10-CM

## 2013-06-10 DIAGNOSIS — Z5111 Encounter for antineoplastic chemotherapy: Secondary | ICD-10-CM

## 2013-06-10 LAB — CBC WITH DIFFERENTIAL/PLATELET
Basophils Absolute: 0.1 10*3/uL (ref 0.0–0.1)
Eosinophils Absolute: 0 10*3/uL (ref 0.0–0.5)
HCT: 29.3 % — ABNORMAL LOW (ref 34.8–46.6)
HGB: 9.4 g/dL — ABNORMAL LOW (ref 11.6–15.9)
MCV: 90.7 fL (ref 79.5–101.0)
MONO%: 8.7 % (ref 0.0–14.0)
NEUT#: 14.3 10*3/uL — ABNORMAL HIGH (ref 1.5–6.5)
RDW: 14.6 % — ABNORMAL HIGH (ref 11.2–14.5)
lymph#: 2.2 10*3/uL (ref 0.9–3.3)

## 2013-06-10 LAB — COMPREHENSIVE METABOLIC PANEL (CC13)
Albumin: 3.6 g/dL (ref 3.5–5.0)
CO2: 24 mEq/L (ref 22–29)
Glucose: 91 mg/dl (ref 70–140)
Potassium: 3.7 mEq/L (ref 3.5–5.1)
Sodium: 140 mEq/L (ref 136–145)
Total Protein: 6.9 g/dL (ref 6.4–8.3)

## 2013-06-10 MED ORDER — SODIUM CHLORIDE 0.9 % IV SOLN
150.0000 mg | Freq: Once | INTRAVENOUS | Status: AC
  Administered 2013-06-10: 150 mg via INTRAVENOUS
  Filled 2013-06-10: qty 5

## 2013-06-10 MED ORDER — SODIUM CHLORIDE 0.9 % IJ SOLN
10.0000 mL | INTRAMUSCULAR | Status: DC | PRN
  Administered 2013-06-10: 10 mL
  Filled 2013-06-10: qty 10

## 2013-06-10 MED ORDER — CYCLOPHOSPHAMIDE CHEMO INJECTION 1 GM
600.0000 mg/m2 | Freq: Once | INTRAMUSCULAR | Status: AC
  Administered 2013-06-10: 860 mg via INTRAVENOUS
  Filled 2013-06-10: qty 43

## 2013-06-10 MED ORDER — PALONOSETRON HCL INJECTION 0.25 MG/5ML
0.2500 mg | Freq: Once | INTRAVENOUS | Status: AC
  Administered 2013-06-10: 0.25 mg via INTRAVENOUS

## 2013-06-10 MED ORDER — HEPARIN SOD (PORK) LOCK FLUSH 100 UNIT/ML IV SOLN
500.0000 [IU] | Freq: Once | INTRAVENOUS | Status: AC | PRN
  Administered 2013-06-10: 500 [IU]
  Filled 2013-06-10: qty 5

## 2013-06-10 MED ORDER — DEXAMETHASONE SODIUM PHOSPHATE 20 MG/5ML IJ SOLN
INTRAMUSCULAR | Status: AC
  Filled 2013-06-10: qty 5

## 2013-06-10 MED ORDER — PALONOSETRON HCL INJECTION 0.25 MG/5ML
INTRAVENOUS | Status: AC
  Filled 2013-06-10: qty 5

## 2013-06-10 MED ORDER — DOXORUBICIN HCL CHEMO IV INJECTION 2 MG/ML
60.0000 mg/m2 | Freq: Once | INTRAVENOUS | Status: AC
  Administered 2013-06-10: 86 mg via INTRAVENOUS
  Filled 2013-06-10: qty 43

## 2013-06-10 MED ORDER — SODIUM CHLORIDE 0.9 % IV SOLN
Freq: Once | INTRAVENOUS | Status: AC
  Administered 2013-06-10: 14:00:00 via INTRAVENOUS

## 2013-06-10 MED ORDER — DEXAMETHASONE SODIUM PHOSPHATE 20 MG/5ML IJ SOLN
12.0000 mg | Freq: Once | INTRAMUSCULAR | Status: AC
  Administered 2013-06-10: 12 mg via INTRAVENOUS

## 2013-06-10 NOTE — Patient Instructions (Signed)
[facility] Discharge Instructions for Patients Receiving Chemotherapy  Today you received the following chemotherapy agents: Adriamycin, Cytoxan. To help prevent nausea and vomiting after your treatment, we encourage you to take your nausea medication.   If you develop nausea and vomiting that is not controlled by your nausea medication, call the clinic.   BELOW ARE SYMPTOMS THAT SHOULD BE REPORTED IMMEDIATELY:  *FEVER GREATER THAN 100.5 F  *CHILLS WITH OR WITHOUT FEVER  NAUSEA AND VOMITING THAT IS NOT CONTROLLED WITH YOUR NAUSEA MEDICATION  *UNUSUAL SHORTNESS OF BREATH  *UNUSUAL BRUISING OR BLEEDING  TENDERNESS IN MOUTH AND THROAT WITH OR WITHOUT PRESENCE OF ULCERS  *URINARY PROBLEMS  *BOWEL PROBLEMS  UNUSUAL RASH Items with * indicate a potential emergency and should be followed up as soon as possible.  Feel free to call the clinic you have any questions or concerns. The clinic phone number is [phone].    

## 2013-06-10 NOTE — Patient Instructions (Signed)
Proceed with AC today with IVF today and on 06/11/13  Return in 1 week for follow up and fluids

## 2013-06-10 NOTE — Telephone Encounter (Signed)
Per staff message and POF I have scheduled appts.  JMW  

## 2013-06-10 NOTE — Telephone Encounter (Signed)
Open by misake 

## 2013-06-11 ENCOUNTER — Ambulatory Visit (HOSPITAL_BASED_OUTPATIENT_CLINIC_OR_DEPARTMENT_OTHER): Payer: Medicare Other

## 2013-06-11 ENCOUNTER — Ambulatory Visit: Payer: Medicare Other

## 2013-06-11 VITALS — BP 133/66 | HR 112 | Temp 97.5°F | Resp 18

## 2013-06-11 DIAGNOSIS — C50919 Malignant neoplasm of unspecified site of unspecified female breast: Secondary | ICD-10-CM

## 2013-06-11 DIAGNOSIS — Z5189 Encounter for other specified aftercare: Secondary | ICD-10-CM

## 2013-06-11 MED ORDER — SODIUM CHLORIDE 0.9 % IV SOLN
Freq: Once | INTRAVENOUS | Status: AC
  Administered 2013-06-11: 09:00:00 via INTRAVENOUS

## 2013-06-11 MED ORDER — PEGFILGRASTIM INJECTION 6 MG/0.6ML
6.0000 mg | Freq: Once | SUBCUTANEOUS | Status: AC
  Administered 2013-06-11: 6 mg via SUBCUTANEOUS

## 2013-06-13 ENCOUNTER — Other Ambulatory Visit: Payer: Self-pay | Admitting: Certified Registered Nurse Anesthetist

## 2013-06-14 ENCOUNTER — Ambulatory Visit (HOSPITAL_BASED_OUTPATIENT_CLINIC_OR_DEPARTMENT_OTHER): Payer: Medicare Other

## 2013-06-14 ENCOUNTER — Encounter: Payer: Self-pay | Admitting: *Deleted

## 2013-06-14 VITALS — BP 129/73 | HR 76 | Temp 98.0°F

## 2013-06-14 DIAGNOSIS — E860 Dehydration: Secondary | ICD-10-CM

## 2013-06-14 DIAGNOSIS — C50112 Malignant neoplasm of central portion of left female breast: Secondary | ICD-10-CM

## 2013-06-14 DIAGNOSIS — C50919 Malignant neoplasm of unspecified site of unspecified female breast: Secondary | ICD-10-CM

## 2013-06-14 MED ORDER — SODIUM CHLORIDE 0.9 % IV SOLN
INTRAVENOUS | Status: DC
  Administered 2013-06-14: 16:00:00 via INTRAVENOUS

## 2013-06-14 NOTE — Progress Notes (Signed)
Mailed after appt letter to pt. 

## 2013-06-14 NOTE — Patient Instructions (Signed)
Dehydration, Adult Dehydration is when you lose more fluids from the body than you take in. Vital organs like the kidneys, brain, and heart cannot function without a proper amount of fluids and salt. Any loss of fluids from the body can cause dehydration.  CAUSES   Vomiting.  Diarrhea.  Excessive sweating.  Excessive urine output.  Fever. SYMPTOMS  Mild dehydration  Thirst.  Dry lips.  Slightly dry mouth. Moderate dehydration  Very dry mouth.  Sunken eyes.  Skin does not bounce back quickly when lightly pinched and released.  Dark urine and decreased urine production.  Decreased tear production.  Headache. Severe dehydration  Very dry mouth.  Extreme thirst.  Rapid, weak pulse (more than 100 beats per minute at rest).  Cold hands and feet.  Not able to sweat in spite of heat and temperature.  Rapid breathing.  Blue lips.  Confusion and lethargy.  Difficulty being awakened.  Minimal urine production.  No tears. DIAGNOSIS  Your caregiver will diagnose dehydration based on your symptoms and your exam. Blood and urine tests will help confirm the diagnosis. The diagnostic evaluation should also identify the cause of dehydration. TREATMENT  Treatment of mild or moderate dehydration can often be done at home by increasing the amount of fluids that you drink. It is best to drink small amounts of fluid more often. Drinking too much at one time can make vomiting worse. Refer to the home care instructions below. Severe dehydration needs to be treated at the hospital where you will probably be given intravenous (IV) fluids that contain water and electrolytes. HOME CARE INSTRUCTIONS   Ask your caregiver about specific rehydration instructions.  Drink enough fluids to keep your urine clear or pale yellow.  Drink small amounts frequently if you have nausea and vomiting.  Eat as you normally do.  Avoid:  Foods or drinks high in sugar.  Carbonated  drinks.  Juice.  Extremely hot or cold fluids.  Drinks with caffeine.  Fatty, greasy foods.  Alcohol.  Tobacco.  Overeating.  Gelatin desserts.  Wash your hands well to avoid spreading bacteria and viruses.  Only take over-the-counter or prescription medicines for pain, discomfort, or fever as directed by your caregiver.  Ask your caregiver if you should continue all prescribed and over-the-counter medicines.  Keep all follow-up appointments with your caregiver. SEEK MEDICAL CARE IF:  You have abdominal pain and it increases or stays in one area (localizes).  You have a rash, stiff neck, or severe headache.  You are irritable, sleepy, or difficult to awaken.  You are weak, dizzy, or extremely thirsty. SEEK IMMEDIATE MEDICAL CARE IF:   You are unable to keep fluids down or you get worse despite treatment.  You have frequent episodes of vomiting or diarrhea.  You have blood or green matter (bile) in your vomit.  You have blood in your stool or your stool looks black and tarry.  You have not urinated in 6 to 8 hours, or you have only urinated a small amount of very dark urine.  You have a fever.  You faint. MAKE SURE YOU:   Understand these instructions.  Will watch your condition.  Will get help right away if you are not doing well or get worse. Document Released: 09/01/2005 Document Revised: 11/24/2011 Document Reviewed: 04/21/2011 Exit[employer] Patient Information 2014 [employer].  

## 2013-06-15 ENCOUNTER — Ambulatory Visit (HOSPITAL_BASED_OUTPATIENT_CLINIC_OR_DEPARTMENT_OTHER): Payer: Medicare Other

## 2013-06-15 VITALS — BP 148/67 | HR 87 | Temp 99.0°F | Resp 20

## 2013-06-15 DIAGNOSIS — C50112 Malignant neoplasm of central portion of left female breast: Secondary | ICD-10-CM

## 2013-06-15 DIAGNOSIS — R110 Nausea: Secondary | ICD-10-CM

## 2013-06-15 DIAGNOSIS — C50919 Malignant neoplasm of unspecified site of unspecified female breast: Secondary | ICD-10-CM

## 2013-06-15 MED ORDER — SODIUM CHLORIDE 0.9 % IJ SOLN
10.0000 mL | INTRAMUSCULAR | Status: DC | PRN
  Administered 2013-06-15: 10 mL via INTRAVENOUS
  Filled 2013-06-15: qty 10

## 2013-06-15 MED ORDER — SODIUM CHLORIDE 0.9 % IV SOLN
Freq: Once | INTRAVENOUS | Status: AC
  Administered 2013-06-15: 14:00:00 via INTRAVENOUS

## 2013-06-15 MED ORDER — HEPARIN SOD (PORK) LOCK FLUSH 100 UNIT/ML IV SOLN
500.0000 [IU] | Freq: Once | INTRAVENOUS | Status: AC
  Administered 2013-06-15: 500 [IU] via INTRAVENOUS
  Filled 2013-06-15: qty 5

## 2013-06-15 NOTE — Patient Instructions (Addendum)
Dehydration, Adult Dehydration is when you lose more fluids from the body than you take in. Vital organs like the kidneys, brain, and heart cannot function without a proper amount of fluids and salt. Any loss of fluids from the body can cause dehydration.  CAUSES   Vomiting.  Diarrhea.  Excessive sweating.  Excessive urine output.  Fever. SYMPTOMS  Mild dehydration  Thirst.  Dry lips.  Slightly dry mouth. Moderate dehydration  Very dry mouth.  Sunken eyes.  Skin does not bounce back quickly when lightly pinched and released.  Dark urine and decreased urine production.  Decreased tear production.  Headache. Severe dehydration  Very dry mouth.  Extreme thirst.  Rapid, weak pulse (more than 100 beats per minute at rest).  Cold hands and feet.  Not able to sweat in spite of heat and temperature.  Rapid breathing.  Blue lips.  Confusion and lethargy.  Difficulty being awakened.  Minimal urine production.  No tears. DIAGNOSIS  Your caregiver will diagnose dehydration based on your symptoms and your exam. Blood and urine tests will help confirm the diagnosis. The diagnostic evaluation should also identify the cause of dehydration. TREATMENT  Treatment of mild or moderate dehydration can often be done at home by increasing the amount of fluids that you drink. It is best to drink small amounts of fluid more often. Drinking too much at one time can make vomiting worse. Refer to the home care instructions below. Severe dehydration needs to be treated at the hospital where you will probably be given intravenous (IV) fluids that contain water and electrolytes. HOME CARE INSTRUCTIONS   Ask your caregiver about specific rehydration instructions.  Drink enough fluids to keep your urine clear or pale yellow.  Drink small amounts frequently if you have nausea and vomiting.  Eat as you normally do.  Avoid:  Foods or drinks high in sugar.  Carbonated  drinks.  Juice.  Extremely hot or cold fluids.  Drinks with caffeine.  Fatty, greasy foods.  Alcohol.  Tobacco.  Overeating.  Gelatin desserts.  Wash your hands well to avoid spreading bacteria and viruses.  Only take over-the-counter or prescription medicines for pain, discomfort, or fever as directed by your caregiver.  Ask your caregiver if you should continue all prescribed and over-the-counter medicines.  Keep all follow-up appointments with your caregiver. SEEK MEDICAL CARE IF:  You have abdominal pain and it increases or stays in one area (localizes).  You have a rash, stiff neck, or severe headache.  You are irritable, sleepy, or difficult to awaken.  You are weak, dizzy, or extremely thirsty. SEEK IMMEDIATE MEDICAL CARE IF:   You are unable to keep fluids down or you get worse despite treatment.  You have frequent episodes of vomiting or diarrhea.  You have blood or green matter (bile) in your vomit.  You have blood in your stool or your stool looks black and tarry.  You have not urinated in 6 to 8 hours, or you have only urinated a small amount of very dark urine.  You have a fever.  You faint. MAKE SURE YOU:   Understand these instructions.  Will watch your condition.  Will get help right away if you are not doing well or get worse. Document Released: 09/01/2005 Document Revised: 11/24/2011 Document Reviewed: 04/21/2011 [employer] Patient Information 2014 [employer].  

## 2013-06-17 ENCOUNTER — Telehealth: Payer: Self-pay | Admitting: *Deleted

## 2013-06-17 ENCOUNTER — Other Ambulatory Visit (HOSPITAL_BASED_OUTPATIENT_CLINIC_OR_DEPARTMENT_OTHER): Payer: Medicare Other | Admitting: Lab

## 2013-06-17 ENCOUNTER — Ambulatory Visit (HOSPITAL_BASED_OUTPATIENT_CLINIC_OR_DEPARTMENT_OTHER): Payer: Medicare Other | Admitting: Adult Health

## 2013-06-17 ENCOUNTER — Encounter: Payer: Self-pay | Admitting: Adult Health

## 2013-06-17 ENCOUNTER — Telehealth: Payer: Self-pay | Admitting: Oncology

## 2013-06-17 VITALS — BP 124/73 | HR 103 | Temp 98.3°F | Resp 20 | Ht <= 58 in | Wt 104.5 lb

## 2013-06-17 DIAGNOSIS — Z171 Estrogen receptor negative status [ER-]: Secondary | ICD-10-CM

## 2013-06-17 DIAGNOSIS — C50919 Malignant neoplasm of unspecified site of unspecified female breast: Secondary | ICD-10-CM

## 2013-06-17 DIAGNOSIS — D702 Other drug-induced agranulocytosis: Secondary | ICD-10-CM

## 2013-06-17 DIAGNOSIS — C50912 Malignant neoplasm of unspecified site of left female breast: Secondary | ICD-10-CM

## 2013-06-17 DIAGNOSIS — C50112 Malignant neoplasm of central portion of left female breast: Secondary | ICD-10-CM

## 2013-06-17 LAB — COMPREHENSIVE METABOLIC PANEL (CC13)
AST: 8 U/L (ref 5–34)
Albumin: 3.5 g/dL (ref 3.5–5.0)
Alkaline Phosphatase: 111 U/L (ref 40–150)
BUN: 12 mg/dL (ref 7.0–26.0)
Calcium: 9.8 mg/dL (ref 8.4–10.4)
Chloride: 101 mEq/L (ref 98–109)
Glucose: 94 mg/dl (ref 70–140)
Potassium: 4.3 mEq/L (ref 3.5–5.1)
Sodium: 136 mEq/L (ref 136–145)
Total Bilirubin: 0.47 mg/dL (ref 0.20–1.20)
Total Protein: 6.8 g/dL (ref 6.4–8.3)

## 2013-06-17 LAB — CBC WITH DIFFERENTIAL/PLATELET
BASO%: 3 % — ABNORMAL HIGH (ref 0.0–2.0)
Basophils Absolute: 0 10*3/uL (ref 0.0–0.1)
EOS%: 1.5 % (ref 0.0–7.0)
HCT: 28.1 % — ABNORMAL LOW (ref 34.8–46.6)
HGB: 9.3 g/dL — ABNORMAL LOW (ref 11.6–15.9)
MCV: 88.6 fL (ref 79.5–101.0)
MONO#: 0.2 10*3/uL (ref 0.1–0.9)
NEUT%: 14.9 % — ABNORMAL LOW (ref 38.4–76.8)
RDW: 14.6 % — ABNORMAL HIGH (ref 11.2–14.5)
WBC: 0.7 10*3/uL — CL (ref 3.9–10.3)
lymph#: 0.4 10*3/uL — ABNORMAL LOW (ref 0.9–3.3)

## 2013-06-17 MED ORDER — CIPROFLOXACIN HCL 500 MG PO TABS: 500.0000 mg | ORAL_TABLET | Freq: Two times a day (BID) | ORAL | Status: DC

## 2013-06-17 MED ORDER — LORAZEPAM 0.5 MG PO TABS: 0.5000 mg | ORAL_TABLET | Freq: Four times a day (QID) | ORAL | Status: DC | PRN

## 2013-06-17 NOTE — Progress Notes (Signed)
OFFICE PROGRESS NOTE  CC**  Ellen Hoit, MD [address] [place] [address]  DIAGNOSIS: 67 year old female with new diagnosis of clinical stage IIA ER neg, PR neg, HER-2/neu neg, invasive ductal carcinoma of the left breast.   PRIOR THERAPY:  1.  Patient went for her annual screening mammogram in July 2014. She was noted to have a mass at the 12:00 position. She had diagnostic mammogram and ultrasound performed of the left breast. The ultrasound showed a 1.8 cm mass in the 12:00 position of the left breast. MRI was also performed that showed a 2.4 cm area of concern in the 12:00 position of the left breast. There were no abnormal appearing axillary subpectoral or internal mammary lymph nodes. Patient's biopsy performed on 04/15/2013 revealed invasive carcinoma with papillary features consistent with a ductal phenotype. Tumor grade was immediate to high grade. It was ER negative PR negative with a proliferation marker Ki-67 47%. HER-2/neu by cish showed no amplification.  2. Neoadjuvant AC started on 05/13/13.     CURRENT THERAPY: Adriamycin Cytoxan cycle 3 day 8  INTERVAL HISTORY: Ellen Beltran 67 y.o. female returns for evaluation following her third cycle of Adriamycin/Cytoxan. She is doing well today.  She did have nausea that was relieved with her anti-emetics.  She has been eating and drinking without difficulty and doesn't feel dehydrated today as she has with prior cycles.  She does note that after this cycle she developed a rash on her hands bilaterally.  She denies any dryness or cracking of her hands.  It itches occasionally and resolves with lubriderm.  Her hands do feel tender when she rubs her hands and tries to remember to pat them instead. She has been out in the sun on occasion. Otherwise she denies fevers, chills, vomiting, constipation, diarrhea, numbness or further concerns.   MEDICAL HISTORY: Past Medical History  Diagnosis Date  . Hypertension   .  Hyperlipidemia   . Osteopenia   . Arthritis   . Hot flashes   . Cancer     left breast    ALLERGIES:  is allergic to codeine; morphine and related; and sulfa antibiotics.  MEDICATIONS:  Current Outpatient Prescriptions  Medication Sig Dispense Refill  . dexamethasone (DECADRON) 4 MG tablet Take 8 mg by mouth 2 (two) times daily. BID x 3 days after chemo      . lidocaine-prilocaine (EMLA) cream Apply topically as needed.  30 g  6  . LORazepam (ATIVAN) 0.5 MG tablet Take 1 tablet (0.5 mg total) by mouth every 6 (six) hours as needed (Nausea or vomiting).  30 tablet  0  . ondansetron (ZOFRAN) 8 MG tablet Take 1 tablet (8 mg total) by mouth 2 (two) times daily as needed. Take two times a day as needed for nausea or vomiting starting on the third day after chemotherapy.  30 tablet  1  . PRESCRIPTION MEDICATION Chemotherapy at [facility].      Marland Kitchen prochlorperazine (COMPAZINE) 10 MG tablet Take 1 tablet (10 mg total) by mouth every 6 (six) hours as needed (Nausea or vomiting).  30 tablet  1  . simvastatin (ZOCOR) 40 MG tablet Take 40 mg by mouth every evening.      . ciprofloxacin (CIPRO) 500 MG tablet Take 1 tablet (500 mg total) by mouth 2 (two) times daily.  14 tablet  0   No current facility-administered medications for this visit.    SURGICAL HISTORY:  Past Surgical History  Procedure Laterality Date  .  Cardiac catheterization  2004  . Tubal ligation  age 55  . Total abdominal hysterectomy  age 88 or 57  . Breast biopsy Left aug 2014  . Portacath placement N/A 05/02/2013    Procedure: INSERTION PORT-A-CATH;  Surgeon: Kandis Cocking, MD;  Location: [facility];  Service: General;  Laterality: N/A;    REVIEW OF SYSTEMS:  A 10 point review of systems was conducted and is otherwise negative except for what is noted above.    PHYSICAL EXAMINATION: Blood pressure 124/73, pulse 103, temperature 98.3 F (36.8 C), temperature source Oral, resp. rate 20, height 4' 9.5" (1.461 m), weight 104 lb 8 oz  (47.401 kg). Body mass index is 22.21 kg/(m^2). General: Patient is a well appearing female in no acute distress HEENT: PERRLA, sclerae anicteric no conjunctival pallor, MMM Neck: supple, no palpable adenopathy Lungs: clear to auscultation bilaterally, no wheezes, rhonchi, or rales Cardiovascular: regular rate rhythm, S1, S2, no murmurs, rubs or gallops Abdomen: Soft, non-tender, non-distended, normoactive bowel sounds, no HSM Extremities: warm and well perfused, no clubbing, cyanosis, or edema Skin: No rashes or lesions Neuro: Non-focal Left breast soft palpable mass, right breast no masses or nodules ECOG PERFORMANCE STATUS: 1 - Symptomatic but completely ambulatory  LABORATORY DATA: Lab Results  Component Value Date   WBC 0.7* 06/17/2013   HGB 9.3* 06/17/2013   HCT 28.1* 06/17/2013   MCV 88.6 06/17/2013   PLT 142* 06/17/2013      Chemistry      Component Value Date/Time   NA 136 06/17/2013 1034   NA 137 05/22/2013 0246   K 4.3 06/17/2013 1034   K 4.2 05/22/2013 0246   CL 108 05/22/2013 0246   CO2 26 06/17/2013 1034   CO2 23 05/22/2013 0246   BUN 12.0 06/17/2013 1034   BUN 13 05/22/2013 0246   CREATININE 0.7 06/17/2013 1034   CREATININE 0.62 05/22/2013 0246      Component Value Date/Time   CALCIUM 9.8 06/17/2013 1034   CALCIUM 8.6 05/22/2013 0246   ALKPHOS 111 06/17/2013 1034   ALKPHOS 75 05/19/2013 1856   AST 8 06/17/2013 1034   AST 8 05/19/2013 1856   ALT 14 06/17/2013 1034   ALT 11 05/19/2013 1856   BILITOT 0.47 06/17/2013 1034   BILITOT 0.7 05/19/2013 1856       RADIOGRAPHIC STUDIES:  Dg Chest 2 View  05/19/2013   *RADIOLOGY REPORT*  Clinical Data: Neutropenic fever.  CHEST - 2 VIEW  Comparison: May 02, 2013  Findings: There is no focal infiltrate, pulmonary edema, or pleural effusion.  The mediastinal contour and cardiac silhouette are stable.  Right central venous line is unchanged.  The soft tissues and osseous structures are stable.  IMPRESSION: No focal pneumonia.   Original Report  Authenticated By: Sherian Rein, M.D.   Dg Chest 2 View  04/29/2013   *RADIOLOGY REPORT*  Clinical Data: Hypertension  CHEST - 2 VIEW  Comparison: None.  Findings: Cardiomediastinal silhouette appears normal.  No acute pulmonary disease is noted.  Bony thorax is intact.  Mild dextroscoliosis of thoracic spine is noted.  Mild hyperexpansion of the lungs is noted.  IMPRESSION: No acute cardiopulmonary abnormality seen.   Original Report Authenticated By: Lupita Raider.,  M.D.   Mr Elbow Right Wo Contrast  05/23/2013   *RADIOLOGY REPORT*  Clinical Data:  Focal area of right elbow pain.  MRI OF THE RIGHT ELBOW WITHOUT CONTRAST  Technique: Multiplanar, multisequence MR imaging of the rightelbow was performed. No intravenous  contrast was administered.  Comparison: None  FINDINGS:  TENDONS Common forearm flexor origin: Minimal tendinopathy. Common forearm extensor origin:  Mild tendinopathy. Biceps: Minimal distal tendinopathy.  No tear. Triceps:  Normal.  LIGAMENTS Medial stabilizers: Intact. Lateral stabilizers: Intact.  Cartilage: Mild degenerative changes but no cartilage defects or osteochondral lesions. Joint:  No joint effusion or loose bodies. Cubital tunnel: Normal. Bones:  No acute bony findings.  Muscles:  There is a focal area of edema like signal abnormality in the extensor carpi radialis muscle.  This is in the location of the patient's palpable abnormality and is most likely a focal muscle tear or possibly a nonspecific myositis.  No muscle mass.  IMPRESSION:  1.  Focal area of edema like signal abnormality in the extensor carpi radialis muscle most likely representing a focal muscle tear or nonspecific myositis. 2.  Mild elbow joint generative changes. 3.  Minimal common flexor and extensor tendinopathy.   Original Report Authenticated By: Rudie Meyer, M.D.   Dg Chest Port 1 View  05/02/2013   *RADIOLOGY REPORT*  Clinical Data: Port-A-Cath placement.  New diagnosis of breast cancer.  PORTABLE CHEST  - 1 VIEW  Comparison: 04/29/2013  Findings: Power port has been inserted.  The tip is at the cavoatrial junction.  The heart size and pulmonary vascularity are normal and the lungs are clear.  No pneumothorax.  Slight thoracic scoliosis.  IMPRESSION: Power port in good position.  No pneumothorax.   Original Report Authenticated By: Francene Boyers, M.D.   Dg C-arm 1-60 Min-no Report  05/02/2013   CLINICAL DATA: port-a-cath   C-ARM 1-60 MINUTES  Fluoroscopy was utilized by the requesting physician.  No radiographic  interpretation.     ASSESSMENT: 68 year old female with   #1 new diagnosis on a screening mammogram of the left breast mass at the 12:00 position. By ultrasound measured 1.8 cm by MRI measuring 2.4 cm. Biopsy of the 12:00 mass revealed an invasive ductal carcinoma intermediate to high-grade. Prognostic markers revealed estrogen receptor negative progesterone receptor negative HER-2/neu-negative with elevated Ki-67 of 47%. Patient has a triple-negative disease. Clinical stage II ( T2 NX MX).   #2  Patient will undergo neoadjuvant Adriamycin Cytoxan given dose dense x4 cycles followed by Taxol and carboplatinum given weekly for 12 weeks. She will then have surgery, and be referred for adjuvant radiation therapy. She is currently on cycle 3 day 8 of Adriamycin/Cytoxan.    PLAN:  1. Doing well.  She is neutropenic following chemotherapy.  I reviewed precautions with her in detail, and gave her a copy of them in her AVS.  She will start Cipro BID.  She does not feel dehydrated or as fatigued as she has with previous cycles.  She will not receive IV fluids today.    2.  I recommended she continue with moisturizing her hands with but with Aquaphor.    3.  She will return in one week for labs/appt/cycle 4 of Adriamycin/Cytoxan.    All questions were answered. The patient knows to call the clinic with any problems, questions or concerns. We can certainly see the patient much sooner if  necessary.  I spent 25 minutes counseling the patient face to face. The total time spent in the appointment was 30 minutes.  Cherie Ouch Lyn Hollingshead, NP Medical Oncology [facility] Phone: [phone] 06/18/2013, 11:42 AM

## 2013-06-17 NOTE — Telephone Encounter (Signed)
Per staff message I have adjusted 10/10 appt

## 2013-06-17 NOTE — Patient Instructions (Signed)
  Patient Neutropenia Instruction Sheet  Diagnosis: Breast Cancer      Treating Physician: Drue Second, MD  Treatment: 1. Type of chemotherapy: Adriamycin/Cytoxan 2. Date of last treatment: 06/10/13  Last Blood Counts: Lab Results  Component Value Date   WBC 0.7* 06/17/2013   HGB 9.3* 06/17/2013   HCT 28.1* 06/17/2013   MCV 88.6 06/17/2013   PLT 142* 06/17/2013        Prophylactic Antibiotics: Cipro 500 mg by mouth twice a day Instructions: 1. Monitor temperature and call if fever  greater than 100.5, chills, shaking chills (rigors) 2. Call Physician on-call at [phone] 3. Give him/her symptoms and list of medications that you are taking and your last blood count.

## 2013-06-22 NOTE — Progress Notes (Signed)
OFFICE PROGRESS NOTE  CC**  Ellen Hoit, MD [address] [place] [address]  DIAGNOSIS: 67 year old female with new diagnosis of clinical stage IIA ER neg, PR neg, HER-2/neu neg, invasive ductal carcinoma of the left breast.   PRIOR THERAPY:  1.  Patient went for her annual screening mammogram in July 2014. She was noted to have a mass at the 12:00 position. She had diagnostic mammogram and ultrasound performed of the left breast. The ultrasound showed a 1.8 cm mass in the 12:00 position of the left breast. MRI was also performed that showed a 2.4 cm area of concern in the 12:00 position of the left breast. There were no abnormal appearing axillary subpectoral or internal mammary lymph nodes. Patient's biopsy performed on 04/15/2013 revealed invasive carcinoma with papillary features consistent with a ductal phenotype. Tumor grade was immediate to high grade. It was ER negative PR negative with a proliferation marker Ki-67 47%. HER-2/neu by cish showed no amplification.  2. Neoadjuvant AC given dose dense plan for 4 cycles started on 05/13/13.  Once patient completes Adriamycin and Cytoxan she will then go on to Taxol and carboplatinum weekly for 12 weeks   CURRENT THERAPY: Adriamycin Cytoxan cycle 3 day 1  INTERVAL HISTORY: Ellen Beltran 67 y.o. female returns for evaluation following her 3rd cycle of Adriamycin/Cytoxan. She has overcome the side effects that she was experiencing with her cycle 2. She feels a little bit stronger and seems to feel that she is making progress. She denies any headaches vision disturbances no double vision blurring of vision no fevers chills or night sweats. She does have hot flashes. She denies any performed paresthesias. No bleeding problems. Remainder of the 10 point review of systems is negative. MEDICAL HISTORY: Past Medical History  Diagnosis Date  . Hypertension   . Hyperlipidemia   . Osteopenia   . Arthritis   . Hot flashes   .  Cancer     left breast    ALLERGIES:  is allergic to codeine; morphine and related; and sulfa antibiotics.  MEDICATIONS:  Current Outpatient Prescriptions  Medication Sig Dispense Refill  . ciprofloxacin (CIPRO) 500 MG tablet Take 1 tablet (500 mg total) by mouth 2 (two) times daily.  14 tablet  0  . dexamethasone (DECADRON) 4 MG tablet Take 8 mg by mouth 2 (two) times daily. BID x 3 days after chemo      . lidocaine-prilocaine (EMLA) cream Apply topically as needed.  30 g  6  . LORazepam (ATIVAN) 0.5 MG tablet Take 1 tablet (0.5 mg total) by mouth every 6 (six) hours as needed (Nausea or vomiting).  30 tablet  0  . ondansetron (ZOFRAN) 8 MG tablet Take 1 tablet (8 mg total) by mouth 2 (two) times daily as needed. Take two times a day as needed for nausea or vomiting starting on the third day after chemotherapy.  30 tablet  1  . PRESCRIPTION MEDICATION Chemotherapy at [facility].      Marland Kitchen prochlorperazine (COMPAZINE) 10 MG tablet Take 1 tablet (10 mg total) by mouth every 6 (six) hours as needed (Nausea or vomiting).  30 tablet  1  . simvastatin (ZOCOR) 40 MG tablet Take 40 mg by mouth every evening.       No current facility-administered medications for this visit.    SURGICAL HISTORY:  Past Surgical History  Procedure Laterality Date  . Cardiac catheterization  2004  . Tubal ligation  age 58  . Total abdominal hysterectomy  age 40 or 61  . Breast biopsy Left aug 2014  . Portacath placement N/A 05/02/2013    Procedure: INSERTION PORT-A-CATH;  Surgeon: Kandis Cocking, MD;  Location: [facility];  Service: General;  Laterality: N/A;    REVIEW OF SYSTEMS:  A 10 point review of systems was conducted and is otherwise negative except for what is noted above.    PHYSICAL EXAMINATION: Blood pressure 136/79, pulse 81, temperature 98.2 F (36.8 C), temperature source Oral, resp. rate 20, height 4' 9.5" (1.461 m), weight 106 lb 9.6 oz (48.353 kg). Body mass index is 22.65 kg/(m^2). General: Patient is a  well appearing female in no acute distress HEENT: PERRLA, sclerae anicteric no conjunctival pallor, MMM Neck: supple, no palpable adenopathy Lungs: clear to auscultation bilaterally, no wheezes, rhonchi, or rales Cardiovascular: regular rate rhythm, S1, S2, no murmurs, rubs or gallops Abdomen: Soft, non-tender, non-distended, normoactive bowel sounds, no HSM Extremities: warm and well perfused, no clubbing, cyanosis, or edema Skin: No rashes or lesions Neuro: Non-focal Left breast soft palpable mass, right breast no masses or nodules ECOG PERFORMANCE STATUS: 1 - Symptomatic but completely ambulatory  LABORATORY DATA: Lab Results  Component Value Date   WBC 0.7* 06/17/2013   HGB 9.3* 06/17/2013   HCT 28.1* 06/17/2013   MCV 88.6 06/17/2013   PLT 142* 06/17/2013      Chemistry      Component Value Date/Time   NA 136 06/17/2013 1034   NA 137 05/22/2013 0246   K 4.3 06/17/2013 1034   K 4.2 05/22/2013 0246   CL 108 05/22/2013 0246   CO2 26 06/17/2013 1034   CO2 23 05/22/2013 0246   BUN 12.0 06/17/2013 1034   BUN 13 05/22/2013 0246   CREATININE 0.7 06/17/2013 1034   CREATININE 0.62 05/22/2013 0246      Component Value Date/Time   CALCIUM 9.8 06/17/2013 1034   CALCIUM 8.6 05/22/2013 0246   ALKPHOS 111 06/17/2013 1034   ALKPHOS 75 05/19/2013 1856   AST 8 06/17/2013 1034   AST 8 05/19/2013 1856   ALT 14 06/17/2013 1034   ALT 11 05/19/2013 1856   BILITOT 0.47 06/17/2013 1034   BILITOT 0.7 05/19/2013 1856       RADIOGRAPHIC STUDIES:  Dg Chest 2 View  05/19/2013   *RADIOLOGY REPORT*  Clinical Data: Neutropenic fever.  CHEST - 2 VIEW  Comparison: May 02, 2013  Findings: There is no focal infiltrate, pulmonary edema, or pleural effusion.  The mediastinal contour and cardiac silhouette are stable.  Right central venous line is unchanged.  The soft tissues and osseous structures are stable.  IMPRESSION: No focal pneumonia.   Original Report Authenticated By: Sherian Rein, M.D.   Dg Chest 2 View  04/29/2013    *RADIOLOGY REPORT*  Clinical Data: Hypertension  CHEST - 2 VIEW  Comparison: None.  Findings: Cardiomediastinal silhouette appears normal.  No acute pulmonary disease is noted.  Bony thorax is intact.  Mild dextroscoliosis of thoracic spine is noted.  Mild hyperexpansion of the lungs is noted.  IMPRESSION: No acute cardiopulmonary abnormality seen.   Original Report Authenticated By: Lupita Raider.,  M.D.   Mr Elbow Right Wo Contrast  05/23/2013   *RADIOLOGY REPORT*  Clinical Data:  Focal area of right elbow pain.  MRI OF THE RIGHT ELBOW WITHOUT CONTRAST  Technique: Multiplanar, multisequence MR imaging of the rightelbow was performed. No intravenous contrast was administered.  Comparison: None  FINDINGS:  TENDONS Common forearm flexor origin: Minimal tendinopathy. Common  forearm extensor origin:  Mild tendinopathy. Biceps: Minimal distal tendinopathy.  No tear. Triceps:  Normal.  LIGAMENTS Medial stabilizers: Intact. Lateral stabilizers: Intact.  Cartilage: Mild degenerative changes but no cartilage defects or osteochondral lesions. Joint:  No joint effusion or loose bodies. Cubital tunnel: Normal. Bones:  No acute bony findings.  Muscles:  There is a focal area of edema like signal abnormality in the extensor carpi radialis muscle.  This is in the location of the patient's palpable abnormality and is most likely a focal muscle tear or possibly a nonspecific myositis.  No muscle mass.  IMPRESSION:  1.  Focal area of edema like signal abnormality in the extensor carpi radialis muscle most likely representing a focal muscle tear or nonspecific myositis. 2.  Mild elbow joint generative changes. 3.  Minimal common flexor and extensor tendinopathy.   Original Report Authenticated By: Rudie Meyer, M.D.   Dg Chest Port 1 View  05/02/2013   *RADIOLOGY REPORT*  Clinical Data: Port-A-Cath placement.  New diagnosis of breast cancer.  PORTABLE CHEST - 1 VIEW  Comparison: 04/29/2013  Findings: Power port has been  inserted.  The tip is at the cavoatrial junction.  The heart size and pulmonary vascularity are normal and the lungs are clear.  No pneumothorax.  Slight thoracic scoliosis.  IMPRESSION: Power port in good position.  No pneumothorax.   Original Report Authenticated By: Francene Boyers, M.D.   Dg C-arm 1-60 Min-no Report  05/02/2013   CLINICAL DATA: port-a-cath   C-ARM 1-60 MINUTES  Fluoroscopy was utilized by the requesting physician.  No radiographic  interpretation.     ASSESSMENT: 67 year old female with   #1 new diagnosis on a screening mammogram of the left breast mass at the 12:00 position. By ultrasound measured 1.8 cm by MRI measuring 2.4 cm. Biopsy of the 12:00 mass revealed an invasive ductal carcinoma intermediate to high-grade. Prognostic markers revealed estrogen receptor negative progesterone receptor negative HER-2/neu-negative with elevated Ki-67 of 47%. Patient has a triple-negative disease. Clinical stage II ( T2 NX MX).   #2  Patient will undergo neoadjuvant Adriamycin Cytoxan given dose dense x4 cycles followed by Taxol and carboplatinum given weekly for 12 weeks. She will then have surgery, and be referred for adjuvant radiation therapy.    PLAN:  1. proceed with cycle 3 of Adriamycin and Cytoxan  #2 she will be seen back in one week's time for interim lab in followup. Patient will receive IV fluids and aggressive symptomatic management to avoid hospitalizations will she  All questions were answered. The patient knows to call the clinic with any problems, questions or concerns. We can certainly see the patient much sooner if necessary.  I spent 25 minutes counseling the patient face to face. The total time spent in the appointment was 30 minutes.  Drue Second, MD Medical/Oncology [facility] [phone] (beeper) [phone] (Office)

## 2013-06-24 ENCOUNTER — Other Ambulatory Visit (HOSPITAL_BASED_OUTPATIENT_CLINIC_OR_DEPARTMENT_OTHER): Payer: Medicare Other | Admitting: Lab

## 2013-06-24 ENCOUNTER — Ambulatory Visit: Payer: Medicare Other | Admitting: Oncology

## 2013-06-24 ENCOUNTER — Other Ambulatory Visit: Payer: Medicare Other | Admitting: Lab

## 2013-06-24 ENCOUNTER — Ambulatory Visit (HOSPITAL_BASED_OUTPATIENT_CLINIC_OR_DEPARTMENT_OTHER): Payer: Medicare Other | Admitting: Physician Assistant

## 2013-06-24 ENCOUNTER — Ambulatory Visit (HOSPITAL_BASED_OUTPATIENT_CLINIC_OR_DEPARTMENT_OTHER): Payer: Medicare Other

## 2013-06-24 VITALS — BP 112/56 | HR 98 | Temp 99.1°F | Resp 18 | Ht <= 58 in | Wt 103.6 lb

## 2013-06-24 DIAGNOSIS — C50112 Malignant neoplasm of central portion of left female breast: Secondary | ICD-10-CM

## 2013-06-24 DIAGNOSIS — Z171 Estrogen receptor negative status [ER-]: Secondary | ICD-10-CM

## 2013-06-24 DIAGNOSIS — Z5111 Encounter for antineoplastic chemotherapy: Secondary | ICD-10-CM

## 2013-06-24 DIAGNOSIS — J019 Acute sinusitis, unspecified: Secondary | ICD-10-CM

## 2013-06-24 DIAGNOSIS — C50919 Malignant neoplasm of unspecified site of unspecified female breast: Secondary | ICD-10-CM

## 2013-06-24 DIAGNOSIS — C50912 Malignant neoplasm of unspecified site of left female breast: Secondary | ICD-10-CM

## 2013-06-24 LAB — CBC WITH DIFFERENTIAL/PLATELET
BASO%: 0.9 % (ref 0.0–2.0)
EOS%: 0.1 % (ref 0.0–7.0)
MCH: 29.3 pg (ref 25.1–34.0)
MCV: 89.3 fL (ref 79.5–101.0)
MONO%: 9.4 % (ref 0.0–14.0)
NEUT#: 12.7 10*3/uL — ABNORMAL HIGH (ref 1.5–6.5)
RBC: 3 10*6/uL — ABNORMAL LOW (ref 3.70–5.45)
RDW: 16.1 % — ABNORMAL HIGH (ref 11.2–14.5)

## 2013-06-24 LAB — COMPREHENSIVE METABOLIC PANEL (CC13)
AST: 26 U/L (ref 5–34)
Albumin: 3.2 g/dL — ABNORMAL LOW (ref 3.5–5.0)
Alkaline Phosphatase: 113 U/L (ref 40–150)
Potassium: 3.6 mEq/L (ref 3.5–5.1)
Sodium: 136 mEq/L (ref 136–145)
Total Protein: 6.9 g/dL (ref 6.4–8.3)

## 2013-06-24 MED ORDER — SODIUM CHLORIDE 0.9 % IV SOLN
Freq: Once | INTRAVENOUS | Status: AC
  Administered 2013-06-24: 12:00:00 via INTRAVENOUS

## 2013-06-24 MED ORDER — SODIUM CHLORIDE 0.9 % IV SOLN
150.0000 mg | Freq: Once | INTRAVENOUS | Status: AC
  Administered 2013-06-24: 150 mg via INTRAVENOUS
  Filled 2013-06-24: qty 5

## 2013-06-24 MED ORDER — DOXORUBICIN HCL CHEMO IV INJECTION 2 MG/ML
60.0000 mg/m2 | Freq: Once | INTRAVENOUS | Status: AC
  Administered 2013-06-24: 86 mg via INTRAVENOUS
  Filled 2013-06-24: qty 43

## 2013-06-24 MED ORDER — DEXAMETHASONE SODIUM PHOSPHATE 20 MG/5ML IJ SOLN
INTRAMUSCULAR | Status: AC
  Filled 2013-06-24: qty 5

## 2013-06-24 MED ORDER — HEPARIN SOD (PORK) LOCK FLUSH 100 UNIT/ML IV SOLN
500.0000 [IU] | Freq: Once | INTRAVENOUS | Status: AC | PRN
  Administered 2013-06-24: 500 [IU]
  Filled 2013-06-24: qty 5

## 2013-06-24 MED ORDER — AZITHROMYCIN 250 MG PO TABS: ORAL_TABLET | ORAL | Status: DC

## 2013-06-24 MED ORDER — SODIUM CHLORIDE 0.9 % IJ SOLN
10.0000 mL | INTRAMUSCULAR | Status: DC | PRN
  Administered 2013-06-24: 10 mL
  Filled 2013-06-24: qty 10

## 2013-06-24 MED ORDER — PALONOSETRON HCL INJECTION 0.25 MG/5ML
INTRAVENOUS | Status: AC
  Filled 2013-06-24: qty 5

## 2013-06-24 MED ORDER — SODIUM CHLORIDE 0.9 % IV SOLN
600.0000 mg/m2 | Freq: Once | INTRAVENOUS | Status: AC
  Administered 2013-06-24: 860 mg via INTRAVENOUS
  Filled 2013-06-24: qty 43

## 2013-06-24 MED ORDER — PALONOSETRON HCL INJECTION 0.25 MG/5ML
0.2500 mg | Freq: Once | INTRAVENOUS | Status: AC
  Administered 2013-06-24: 0.25 mg via INTRAVENOUS

## 2013-06-24 MED ORDER — DEXAMETHASONE SODIUM PHOSPHATE 20 MG/5ML IJ SOLN
12.0000 mg | Freq: Once | INTRAMUSCULAR | Status: AC
  Administered 2013-06-24: 12 mg via INTRAVENOUS

## 2013-06-24 NOTE — Progress Notes (Signed)
+   blood return before,during and after Adriamycin push

## 2013-06-24 NOTE — Patient Instructions (Signed)
[facility] Discharge Instructions for Patients Receiving Chemotherapy  Today you received the following chemotherapy agents : adriamycin & cytoxan  To help prevent nausea and vomiting after your treatment, we encourage you to take your nausea medications as directed:  Decadron 8 mg twice daily X  3 days (start 06/25/13)  Compazine 10 mg every 6 hours as needed  Ativan 0.5 mg every 6 hours as needed  Zofran 8 mg twice daily as needed for nausea (start on 3rd day after chemo)   If you develop nausea and vomiting that is not controlled by your nausea medication, call the clinic.   BELOW ARE SYMPTOMS THAT SHOULD BE REPORTED IMMEDIATELY:  *FEVER GREATER THAN 100.5 F  *CHILLS WITH OR WITHOUT FEVER  NAUSEA AND VOMITING THAT IS NOT CONTROLLED WITH YOUR NAUSEA MEDICATION  *UNUSUAL SHORTNESS OF BREATH  *UNUSUAL BRUISING OR BLEEDING  TENDERNESS IN MOUTH AND THROAT WITH OR WITHOUT PRESENCE OF ULCERS  *URINARY PROBLEMS  *BOWEL PROBLEMS  UNUSUAL RASH Items with * indicate a potential emergency and should be followed up as soon as possible.  PUSH FLUIDS BY MOUTH  Feel free to call the clinic you have any questions or concerns. The clinic phone number is [phone].  It was a pleasure to serve you today !

## 2013-06-25 ENCOUNTER — Ambulatory Visit (HOSPITAL_BASED_OUTPATIENT_CLINIC_OR_DEPARTMENT_OTHER): Payer: Medicare Other

## 2013-06-25 VITALS — BP 128/49 | HR 101 | Temp 98.2°F

## 2013-06-25 DIAGNOSIS — C50919 Malignant neoplasm of unspecified site of unspecified female breast: Secondary | ICD-10-CM

## 2013-06-25 DIAGNOSIS — Z5189 Encounter for other specified aftercare: Secondary | ICD-10-CM

## 2013-06-25 MED ORDER — PEGFILGRASTIM INJECTION 6 MG/0.6ML
6.0000 mg | Freq: Once | SUBCUTANEOUS | Status: AC
  Administered 2013-06-25: 6 mg via SUBCUTANEOUS

## 2013-06-27 ENCOUNTER — Other Ambulatory Visit: Payer: Self-pay | Admitting: Certified Registered Nurse Anesthetist

## 2013-06-28 NOTE — Patient Instructions (Signed)
Followup in one week for another symptom management visit, repeat labs and the next scheduled dose of your chemotherapy Take a Z-Pak according to instructions for your complaints of sinusitis.

## 2013-06-28 NOTE — Progress Notes (Signed)
OFFICE PROGRESS NOTE  CC**  Ellen Hoit, MD [address] [place] [address]  DIAGNOSIS: 67 year old female with new diagnosis of clinical stage IIA ER neg, PR neg, HER-2/neu neg, invasive ductal carcinoma of the left breast.   PRIOR THERAPY:  1.  Patient went for her annual screening mammogram in July 2014. She was noted to have a mass at the 12:00 position. She had diagnostic mammogram and ultrasound performed of the left breast. The ultrasound showed a 1.8 cm mass in the 12:00 position of the left breast. MRI was also performed that showed a 2.4 cm area of concern in the 12:00 position of the left breast. There were no abnormal appearing axillary subpectoral or internal mammary lymph nodes. Patient's biopsy performed on 04/15/2013 revealed invasive carcinoma with papillary features consistent with a ductal phenotype. Tumor grade was immediate to high grade. It was ER negative PR negative with a proliferation marker Ki-67 47%. HER-2/neu by cish showed no amplification.  2. Neoadjuvant AC started on 05/13/13.     CURRENT THERAPY: Adriamycin Cytoxan cycle 4 day 1  INTERVAL HISTORY: Ellen Beltran 67 y.o. female returns for evaluation, now status post 3 cycles of Adriamycin/Cytoxan. She is doing well today except for some sinus congestion.  She reports waking up with him headache not associated with blurred or double vision or any nausea or vomiting. She's had some occasional chills and reports a MAXIMUM TEMPERATURE of 100.2. She has had some blood-tinged nasal secretions She did have nausea that was relieved with her anti-emetics.  Her by mouth intake of food and fluids has been good recently. She states that she completed her last tablet of Cipro today that was prescribed for empiric neutropenic coverage. Otherwise she denies other fevers, chills, vomiting, constipation, diarrhea, numbness or further concerns.   MEDICAL HISTORY: Past Medical History  Diagnosis Date  .  Hypertension   . Hyperlipidemia   . Osteopenia   . Arthritis   . Hot flashes   . Cancer     left breast    ALLERGIES:  is allergic to codeine; morphine and related; and sulfa antibiotics.  MEDICATIONS:  Current Outpatient Prescriptions  Medication Sig Dispense Refill  . azithromycin (ZITHROMAX) 250 MG tablet Take 2 tablets by mouth on day one, then take 1 tablet by mouth daily until completed  6 each  0  . ciprofloxacin (CIPRO) 500 MG tablet Take 1 tablet (500 mg total) by mouth 2 (two) times daily.  14 tablet  0  . dexamethasone (DECADRON) 4 MG tablet Take 8 mg by mouth 2 (two) times daily. BID x 3 days after chemo      . lidocaine-prilocaine (EMLA) cream Apply topically as needed.  30 g  6  . LORazepam (ATIVAN) 0.5 MG tablet Take 1 tablet (0.5 mg total) by mouth every 6 (six) hours as needed (Nausea or vomiting).  30 tablet  0  . ondansetron (ZOFRAN) 8 MG tablet Take 1 tablet (8 mg total) by mouth 2 (two) times daily as needed. Take two times a day as needed for nausea or vomiting starting on the third day after chemotherapy.  30 tablet  1  . PRESCRIPTION MEDICATION Chemotherapy at [facility].      Marland Kitchen prochlorperazine (COMPAZINE) 10 MG tablet Take 1 tablet (10 mg total) by mouth every 6 (six) hours as needed (Nausea or vomiting).  30 tablet  1  . simvastatin (ZOCOR) 40 MG tablet Take 40 mg by mouth every evening.  No current facility-administered medications for this visit.    SURGICAL HISTORY:  Past Surgical History  Procedure Laterality Date  . Cardiac catheterization  2004  . Tubal ligation  age 51  . Total abdominal hysterectomy  age 62 or 76  . Breast biopsy Left aug 2014  . Portacath placement N/A 05/02/2013    Procedure: INSERTION PORT-A-CATH;  Surgeon: Kandis Cocking, MD;  Location: [facility];  Service: General;  Laterality: N/A;    REVIEW OF SYSTEMS:  A 10 point review of systems was conducted and is otherwise negative except for what is noted above.    PHYSICAL  EXAMINATION: Blood pressure 112/56, pulse 98, temperature 99.1 F (37.3 C), temperature source Oral, resp. rate 18, height 4' 9.5" (1.461 m), weight 103 lb 9.6 oz (46.993 kg), SpO2 99.00%. Body mass index is 22.02 kg/(m^2). General: Patient is a well appearing female in no acute distress HEENT: PERRLA, sclerae anicteric no conjunctival pallor, no evidence of thrush or mucositis. There is some slight point tenderness to palpation and percussion over the frontal and maxillary sinuses bilaterally Neck: supple, no palpable adenopathy Lungs: clear to auscultation bilaterally, no wheezes, rhonchi, or rales Cardiovascular: regular rate rhythm, S1, S2, no murmurs, rubs or gallops Abdomen: Soft, non-tender, non-distended, normoactive bowel sounds, no HSM Extremities: warm and well perfused, no clubbing, cyanosis, or edema Skin: No rashes or lesions Neuro: Non-focal Breast exam: Exam deferred   ECOG PERFORMANCE STATUS: 1 - Symptomatic but completely ambulatory  LABORATORY DATA: Lab Results  Component Value Date   WBC 15.8* 06/24/2013   HGB 8.8* 06/24/2013   HCT 26.8* 06/24/2013   MCV 89.3 06/24/2013   PLT 342 06/24/2013      Chemistry      Component Value Date/Time   NA 136 06/24/2013 0940   NA 137 05/22/2013 0246   K 3.6 06/24/2013 0940   K 4.2 05/22/2013 0246   CL 108 05/22/2013 0246   CO2 24 06/24/2013 0940   CO2 23 05/22/2013 0246   BUN 8.8 06/24/2013 0940   BUN 13 05/22/2013 0246   CREATININE 0.7 06/24/2013 0940   CREATININE 0.62 05/22/2013 0246      Component Value Date/Time   CALCIUM 9.8 06/24/2013 0940   CALCIUM 8.6 05/22/2013 0246   ALKPHOS 113 06/24/2013 0940   ALKPHOS 75 05/19/2013 1856   AST 26 06/24/2013 0940   AST 8 05/19/2013 1856   ALT 33 06/24/2013 0940   ALT 11 05/19/2013 1856   BILITOT 0.25 06/24/2013 0940   BILITOT 0.7 05/19/2013 1856       RADIOGRAPHIC STUDIES:  Dg Chest 2 View  05/19/2013   *RADIOLOGY REPORT*  Clinical Data: Neutropenic fever.  CHEST - 2 VIEW   Comparison: May 02, 2013  Findings: There is no focal infiltrate, pulmonary edema, or pleural effusion.  The mediastinal contour and cardiac silhouette are stable.  Right central venous line is unchanged.  The soft tissues and osseous structures are stable.  IMPRESSION: No focal pneumonia.   Original Report Authenticated By: Sherian Rein, M.D.   Dg Chest 2 View  04/29/2013   *RADIOLOGY REPORT*  Clinical Data: Hypertension  CHEST - 2 VIEW  Comparison: None.  Findings: Cardiomediastinal silhouette appears normal.  No acute pulmonary disease is noted.  Bony thorax is intact.  Mild dextroscoliosis of thoracic spine is noted.  Mild hyperexpansion of the lungs is noted.  IMPRESSION: No acute cardiopulmonary abnormality seen.   Original Report Authenticated By: Lupita Raider.,  M.D.   Mr  Elbow Right Wo Contrast  05/23/2013   *RADIOLOGY REPORT*  Clinical Data:  Focal area of right elbow pain.  MRI OF THE RIGHT ELBOW WITHOUT CONTRAST  Technique: Multiplanar, multisequence MR imaging of the rightelbow was performed. No intravenous contrast was administered.  Comparison: None  FINDINGS:  TENDONS Common forearm flexor origin: Minimal tendinopathy. Common forearm extensor origin:  Mild tendinopathy. Biceps: Minimal distal tendinopathy.  No tear. Triceps:  Normal.  LIGAMENTS Medial stabilizers: Intact. Lateral stabilizers: Intact.  Cartilage: Mild degenerative changes but no cartilage defects or osteochondral lesions. Joint:  No joint effusion or loose bodies. Cubital tunnel: Normal. Bones:  No acute bony findings.  Muscles:  There is a focal area of edema like signal abnormality in the extensor carpi radialis muscle.  This is in the location of the patient's palpable abnormality and is most likely a focal muscle tear or possibly a nonspecific myositis.  No muscle mass.  IMPRESSION:  1.  Focal area of edema like signal abnormality in the extensor carpi radialis muscle most likely representing a focal muscle tear or  nonspecific myositis. 2.  Mild elbow joint generative changes. 3.  Minimal common flexor and extensor tendinopathy.   Original Report Authenticated By: Rudie Meyer, M.D.   Dg Chest Port 1 View  05/02/2013   *RADIOLOGY REPORT*  Clinical Data: Port-A-Cath placement.  New diagnosis of breast cancer.  PORTABLE CHEST - 1 VIEW  Comparison: 04/29/2013  Findings: Power port has been inserted.  The tip is at the cavoatrial junction.  The heart size and pulmonary vascularity are normal and the lungs are clear.  No pneumothorax.  Slight thoracic scoliosis.  IMPRESSION: Power port in good position.  No pneumothorax.   Original Report Authenticated By: Francene Boyers, M.D.   Dg C-arm 1-60 Min-no Report  05/02/2013   CLINICAL DATA: port-a-cath   C-ARM 1-60 MINUTES  Fluoroscopy was utilized by the requesting physician.  No radiographic  interpretation.     ASSESSMENT: 67 year old female with   #1 new diagnosis on a screening mammogram of the left breast mass at the 12:00 position. By ultrasound measured 1.8 cm by MRI measuring 2.4 cm. Biopsy of the 12:00 mass revealed an invasive ductal carcinoma intermediate to high-grade. Prognostic markers revealed estrogen receptor negative progesterone receptor negative HER-2/neu-negative with elevated Ki-67 of 47%. Patient has a triple-negative disease. Clinical stage II ( T2 NX MX).   #2  Patient will undergo neoadjuvant Adriamycin Cytoxan given dose dense x4 cycles followed by Taxol and carboplatinum given weekly for 12 weeks. She will then have surgery, and be referred for adjuvant radiation therapy. She is currently on cycle 3 day 8 of Adriamycin/Cytoxan.    #3. Symptoms consistent with sinusitis  PLAN:  1. Doing well.  Counts have completely recovered. Patient reviewed with Dr. Rosie Fate. Review we'll have her proceed with day 1 of cycle 4 of her systemic chemotherapy with Adriamycin and Cytoxan as scheduled today.   2.  prescribed a Z-Pak for her symptoms consistent  with sinusitis. Special care was sent her pharmacy of record via E. scribed.   3.  She will return in one week for labs/appt/cycle 4, day 8 of Adriamycin/Cytoxan.    All questions were answered. The patient knows to call the clinic with any problems, questions or concerns. We can certainly see the patient much sooner if necessary.  I spent 25 minutes counseling the patient face to face. The total time spent in the appointment was 30 minutes.  Conni Slipper, PA-C  Medical  Oncology [facility] Phone: [phone] 06/28/2013, 10:25 AM

## 2013-07-01 ENCOUNTER — Ambulatory Visit (HOSPITAL_BASED_OUTPATIENT_CLINIC_OR_DEPARTMENT_OTHER): Payer: Medicare Other | Admitting: Adult Health

## 2013-07-01 ENCOUNTER — Encounter: Payer: Self-pay | Admitting: Adult Health

## 2013-07-01 ENCOUNTER — Telehealth: Payer: Self-pay | Admitting: *Deleted

## 2013-07-01 ENCOUNTER — Ambulatory Visit (HOSPITAL_COMMUNITY)
Admission: RE | Admit: 2013-07-01 | Discharge: 2013-07-01 | Disposition: A | Discharge status: Home / Self Care | Payer: Medicare Other | Source: Ambulatory Visit | Attending: Oncology | Admitting: Oncology

## 2013-07-01 ENCOUNTER — Other Ambulatory Visit (HOSPITAL_BASED_OUTPATIENT_CLINIC_OR_DEPARTMENT_OTHER): Payer: Medicare Other | Admitting: Lab

## 2013-07-01 ENCOUNTER — Ambulatory Visit: Payer: Medicare Other | Admitting: Lab

## 2013-07-01 VITALS — BP 113/70 | HR 103 | Temp 98.7°F | Resp 20 | Ht <= 58 in | Wt 102.1 lb

## 2013-07-01 DIAGNOSIS — C50912 Malignant neoplasm of unspecified site of left female breast: Secondary | ICD-10-CM

## 2013-07-01 DIAGNOSIS — T451X5A Adverse effect of antineoplastic and immunosuppressive drugs, initial encounter: Secondary | ICD-10-CM | POA: Insufficient documentation

## 2013-07-01 DIAGNOSIS — D696 Thrombocytopenia, unspecified: Secondary | ICD-10-CM

## 2013-07-01 DIAGNOSIS — D649 Anemia, unspecified: Secondary | ICD-10-CM

## 2013-07-01 DIAGNOSIS — C50112 Malignant neoplasm of central portion of left female breast: Secondary | ICD-10-CM

## 2013-07-01 DIAGNOSIS — D6481 Anemia due to antineoplastic chemotherapy: Secondary | ICD-10-CM

## 2013-07-01 DIAGNOSIS — D702 Other drug-induced agranulocytosis: Secondary | ICD-10-CM

## 2013-07-01 DIAGNOSIS — D709 Neutropenia, unspecified: Secondary | ICD-10-CM

## 2013-07-01 DIAGNOSIS — C50119 Malignant neoplasm of central portion of unspecified female breast: Secondary | ICD-10-CM | POA: Insufficient documentation

## 2013-07-01 DIAGNOSIS — C50919 Malignant neoplasm of unspecified site of unspecified female breast: Secondary | ICD-10-CM

## 2013-07-01 DIAGNOSIS — Z171 Estrogen receptor negative status [ER-]: Secondary | ICD-10-CM

## 2013-07-01 LAB — COMPREHENSIVE METABOLIC PANEL (CC13)
ALT: 16 U/L (ref 0–55)
AST: 8 U/L (ref 5–34)
Alkaline Phosphatase: 83 U/L (ref 40–150)
BUN: 12.3 mg/dL (ref 7.0–26.0)
CO2: 25 mEq/L (ref 22–29)
Creatinine: 0.7 mg/dL (ref 0.6–1.1)
Sodium: 134 mEq/L — ABNORMAL LOW (ref 136–145)

## 2013-07-01 LAB — CBC WITH DIFFERENTIAL/PLATELET
HGB: 7.4 g/dL — ABNORMAL LOW (ref 11.6–15.9)
MCHC: 33 g/dL (ref 31.5–36.0)
MCV: 87.5 fL (ref 79.5–101.0)
Platelets: 72 10*3/uL — ABNORMAL LOW (ref 145–400)
RBC: 2.56 10*6/uL — ABNORMAL LOW (ref 3.70–5.45)
RDW: 15.2 % — ABNORMAL HIGH (ref 11.2–14.5)
WBC: 0.2 10*3/uL — CL (ref 3.9–10.3)

## 2013-07-01 LAB — PREPARE RBC (CROSSMATCH)

## 2013-07-01 MED ORDER — CIPROFLOXACIN HCL 500 MG PO TABS: 500.0000 mg | ORAL_TABLET | Freq: Two times a day (BID) | ORAL | Status: DC

## 2013-07-01 NOTE — Telephone Encounter (Signed)
Per staff message and POF I have scheduled appts.  JMW  

## 2013-07-01 NOTE — Progress Notes (Signed)
OFFICE PROGRESS NOTE  CC**  Ellen Hoit, MD [address] [place] [address]  DIAGNOSIS: 67 year old female with new diagnosis of clinical stage IIA ER neg, PR neg, HER-2/neu neg, invasive ductal carcinoma of the left breast.   PRIOR THERAPY:  1.  Patient went for her annual screening mammogram in July 2014. She was noted to have a mass at the 12:00 position. She had diagnostic mammogram and ultrasound performed of the left breast. The ultrasound showed a 1.8 cm mass in the 12:00 position of the left breast. MRI was also performed that showed a 2.4 cm area of concern in the 12:00 position of the left breast. There were no abnormal appearing axillary subpectoral or internal mammary lymph nodes. Patient's biopsy performed on 04/15/2013 revealed invasive carcinoma with papillary features consistent with a ductal phenotype. Tumor grade was immediate to high grade. It was ER negative PR negative with a proliferation marker Ki-67 47%. HER-2/neu by cish showed no amplification.  2. Neoadjuvant AC started on 05/13/13.     CURRENT THERAPY: Adriamycin Cytoxan cycle 4 day 8  INTERVAL HISTORY: Ellen Beltran 67 y.o. female returns for evaluation, she is very fatigued today.  She had an episode of getting up and falling down this morning and scraped her cheek.  She is anemic and neutropenic.  She denies any fevers, chills, headaches, vision changes, numbness, nausea, vomiting, constipation, or any further concerns.    MEDICAL HISTORY: Past Medical History  Diagnosis Date  . Hypertension   . Hyperlipidemia   . Osteopenia   . Arthritis   . Hot flashes   . Cancer     left breast    ALLERGIES:  is allergic to codeine; morphine and related; and sulfa antibiotics.  MEDICATIONS:  Current Outpatient Prescriptions  Medication Sig Dispense Refill  . azithromycin (ZITHROMAX) 250 MG tablet Take 2 tablets by mouth on day one, then take 1 tablet by mouth daily until completed  6 each  0   . ciprofloxacin (CIPRO) 500 MG tablet Take 1 tablet (500 mg total) by mouth 2 (two) times daily.  14 tablet  0  . dexamethasone (DECADRON) 4 MG tablet Take 8 mg by mouth 2 (two) times daily. BID x 3 days after chemo      . lidocaine-prilocaine (EMLA) cream Apply topically as needed.  30 g  6  . LORazepam (ATIVAN) 0.5 MG tablet Take 1 tablet (0.5 mg total) by mouth every 6 (six) hours as needed (Nausea or vomiting).  30 tablet  0  . ondansetron (ZOFRAN) 8 MG tablet Take 1 tablet (8 mg total) by mouth 2 (two) times daily as needed. Take two times a day as needed for nausea or vomiting starting on the third day after chemotherapy.  30 tablet  1  . PRESCRIPTION MEDICATION Chemotherapy at [facility].      Marland Kitchen prochlorperazine (COMPAZINE) 10 MG tablet Take 1 tablet (10 mg total) by mouth every 6 (six) hours as needed (Nausea or vomiting).  30 tablet  1  . simvastatin (ZOCOR) 40 MG tablet Take 40 mg by mouth every evening.       No current facility-administered medications for this visit.    SURGICAL HISTORY:  Past Surgical History  Procedure Laterality Date  . Cardiac catheterization  2004  . Tubal ligation  age 89  . Total abdominal hysterectomy  age 48 or 35  . Breast biopsy Left aug 2014  . Portacath placement N/A 05/02/2013    Procedure: INSERTION PORT-A-CATH;  Surgeon: Kandis Cocking, MD;  Location: [facility];  Service: General;  Laterality: N/A;    REVIEW OF SYSTEMS:  A 10 point review of systems was conducted and is otherwise negative except for what is noted above.    PHYSICAL EXAMINATION: Blood pressure 113/70, pulse 103, temperature 98.7 F (37.1 C), temperature source Oral, resp. rate 20, height 4' 9.5" (1.461 m), weight 102 lb 1.6 oz (46.312 kg). Body mass index is 21.7 kg/(m^2). General: Patient is a well appearing female in no acute distress HEENT: PERRLA, sclerae anicteric no conjunctival pallor, no evidence of thrush or mucositis.  Neck: supple, no palpable adenopathy Lungs: clear to  auscultation bilaterally, no wheezes, rhonchi, or rales Cardiovascular: regular rate rhythm, S1, S2, no murmurs, rubs or gallops Abdomen: Soft, non-tender, non-distended, normoactive bowel sounds, no HSM Extremities: warm and well perfused, no clubbing, cyanosis, or edema Skin: No rashes or lesions Neuro: Non-focal Breast exam: Exam deferred  ECOG PERFORMANCE STATUS: 1 - Symptomatic but completely ambulatory  LABORATORY DATA: Lab Results  Component Value Date   WBC 0.2* 07/01/2013   HGB 7.4* 07/01/2013   HCT 22.4* 07/01/2013   MCV 87.5 07/01/2013   PLT 72* 07/01/2013      Chemistry      Component Value Date/Time   NA 136 06/24/2013 0940   NA 137 05/22/2013 0246   K 3.6 06/24/2013 0940   K 4.2 05/22/2013 0246   CL 108 05/22/2013 0246   CO2 24 06/24/2013 0940   CO2 23 05/22/2013 0246   BUN 8.8 06/24/2013 0940   BUN 13 05/22/2013 0246   CREATININE 0.7 06/24/2013 0940   CREATININE 0.62 05/22/2013 0246      Component Value Date/Time   CALCIUM 9.8 06/24/2013 0940   CALCIUM 8.6 05/22/2013 0246   ALKPHOS 113 06/24/2013 0940   ALKPHOS 75 05/19/2013 1856   AST 26 06/24/2013 0940   AST 8 05/19/2013 1856   ALT 33 06/24/2013 0940   ALT 11 05/19/2013 1856   BILITOT 0.25 06/24/2013 0940   BILITOT 0.7 05/19/2013 1856       RADIOGRAPHIC STUDIES:  Dg Chest 2 View  05/19/2013   *RADIOLOGY REPORT*  Clinical Data: Neutropenic fever.  CHEST - 2 VIEW  Comparison: May 02, 2013  Findings: There is no focal infiltrate, pulmonary edema, or pleural effusion.  The mediastinal contour and cardiac silhouette are stable.  Right central venous line is unchanged.  The soft tissues and osseous structures are stable.  IMPRESSION: No focal pneumonia.   Original Report Authenticated By: Sherian Rein, M.D.   Dg Chest 2 View  04/29/2013   *RADIOLOGY REPORT*  Clinical Data: Hypertension  CHEST - 2 VIEW  Comparison: None.  Findings: Cardiomediastinal silhouette appears normal.  No acute pulmonary disease is noted.  Bony  thorax is intact.  Mild dextroscoliosis of thoracic spine is noted.  Mild hyperexpansion of the lungs is noted.  IMPRESSION: No acute cardiopulmonary abnormality seen.   Original Report Authenticated By: Lupita Raider.,  M.D.   Mr Elbow Right Wo Contrast  05/23/2013   *RADIOLOGY REPORT*  Clinical Data:  Focal area of right elbow pain.  MRI OF THE RIGHT ELBOW WITHOUT CONTRAST  Technique: Multiplanar, multisequence MR imaging of the rightelbow was performed. No intravenous contrast was administered.  Comparison: None  FINDINGS:  TENDONS Common forearm flexor origin: Minimal tendinopathy. Common forearm extensor origin:  Mild tendinopathy. Biceps: Minimal distal tendinopathy.  No tear. Triceps:  Normal.  LIGAMENTS Medial stabilizers: Intact. Lateral stabilizers: Intact.  Cartilage: Mild degenerative changes but no cartilage defects or osteochondral lesions. Joint:  No joint effusion or loose bodies. Cubital tunnel: Normal. Bones:  No acute bony findings.  Muscles:  There is a focal area of edema like signal abnormality in the extensor carpi radialis muscle.  This is in the location of the patient's palpable abnormality and is most likely a focal muscle tear or possibly a nonspecific myositis.  No muscle mass.  IMPRESSION:  1.  Focal area of edema like signal abnormality in the extensor carpi radialis muscle most likely representing a focal muscle tear or nonspecific myositis. 2.  Mild elbow joint generative changes. 3.  Minimal common flexor and extensor tendinopathy.   Original Report Authenticated By: Rudie Meyer, M.D.   Dg Chest Port 1 View  05/02/2013   *RADIOLOGY REPORT*  Clinical Data: Port-A-Cath placement.  New diagnosis of breast cancer.  PORTABLE CHEST - 1 VIEW  Comparison: 04/29/2013  Findings: Power port has been inserted.  The tip is at the cavoatrial junction.  The heart size and pulmonary vascularity are normal and the lungs are clear.  No pneumothorax.  Slight thoracic scoliosis.  IMPRESSION:  Power port in good position.  No pneumothorax.   Original Report Authenticated By: Francene Boyers, M.D.   Dg C-arm 1-60 Min-no Report  05/02/2013   CLINICAL DATA: port-a-cath   C-ARM 1-60 MINUTES  Fluoroscopy was utilized by the requesting physician.  No radiographic  interpretation.     ASSESSMENT: 67 year old female with   #1 new diagnosis on a screening mammogram of the left breast mass at the 12:00 position. By ultrasound measured 1.8 cm by MRI measuring 2.4 cm. Biopsy of the 12:00 mass revealed an invasive ductal carcinoma intermediate to high-grade. Prognostic markers revealed estrogen receptor negative progesterone receptor negative HER-2/neu-negative with elevated Ki-67 of 47%. Patient has a triple-negative disease. Clinical stage II ( T2 NX MX).   #2  Patient will undergo neoadjuvant Adriamycin Cytoxan given dose dense x4 cycles followed by Taxol and carboplatinum given weekly for 12 weeks. She will then have surgery, and be referred for adjuvant radiation therapy. She is currently on cycle 4 day 8 of Adriamycin/Cytoxan.     PLAN:  1. Ms. Dessert is doing moderately well today.  She is neutropenic, anemic and thrombocytopenic today.  She denies fevers.  I reviewed neutropenic precautions with her in detail and prescribed cipro for her to take bid.    2. I counseled the patient on her anemia.  Her hemoglobin is 7.2.  I recommended a blood transfusion and discussed the risks and benefits with her.  She will need to be typed and crossmatched today, and receive the blood tomorrow in the treatment room at 830 am which I scheduled.  I additionally gave her information regarding the blood transfusions, risks and process in her after visit summary to review tonight.  I informed her that receiving the blood transfusion would make her feel much better, and that if she did not she would be assuming the risk of feeling worse and any event that happened due to the anemia.    3.  She did fall and has a  very mild abrasion on her left cheek, she does not have any signs of head trauma due to this. We discussed signs that she could have and knows to go to the ER should she develop a headache, change in vision, weakness, numbness, speech change, or any other concern.    4.  We discussed that she has  now completed her first regimen of chemotherapy with adriamycin/cytoxan.  I reviewed in detail with her, the upcoming chemotherapy regimen of Taxol and Carboplatin.  I recommended she take a week off from chemotherapy to help allow her to recover and rest from her first regimen.  She is undecided on whether to wait, or to proceed with chemotherapy next week.  I have scheduled the appointments as if she were to proceed with Taxol/Carbo next week, she will call me and let me know if she wants to wait. I gave her detailed information on the taxol/carbo in her AVS for her to review and return with any questions regarding this.    All questions were answered. The patient knows to call the clinic with any problems, questions or concerns. We can certainly see the patient much sooner if necessary.  I spent 40 minutes counseling the patient face to face. The total time spent in the appointment was 60 minutes.  Illa Level, NP Medical Oncology [facility] [phone]

## 2013-07-01 NOTE — Patient Instructions (Addendum)
Patient Neutropenia Instruction Sheet  Diagnosis: Breast Cancer      Treating Physician: Drue Second, MD  Treatment: 1. Type of chemotherapy: adriamycin/cytoxan 2. Date of last treatment: 06/24/13  Last Blood Counts: Lab Results  Component Value Date   WBC 0.2* 07/01/2013   HGB 7.4* 07/01/2013   HCT 22.4* 07/01/2013   MCV 87.5 07/01/2013   PLT 72* 07/01/2013        Prophylactic Antibiotics: Cipro 500 mg by mouth twice a day Instructions: 1. Monitor temperature and call if fever  greater than 100.5, chills, shaking chills (rigors) 2. Call Physician on-call at [phone] 3. Give him/her symptoms and list of medications that you are taking and your last blood count.    Blood Products Information This is information about transfusions of blood products. All blood that is to be transfused is tested for blood type, compatibility with the recipient, and for infections. Except in emergencies, giving a transfusion requires a written consent. Blood transfusions are often given as packed red blood cells. This means the other parts of the blood have been taken out. Blood may be needed to treat severe anemia or bleeding. Other blood products include plasma, platelets, immune globulin, and cryoprecipitate. Blood for transfusion is mostly donated by volunteers. The blood donors are carefully screened for risk factors that could cause disease. Donors are all tested for infections that could be transmitted by blood. The blood product supply today is the safest it has ever been. Some risks do remain.  A minor reaction with fever, chills, or rash happens in about 1% of blood product transfusions.  Life-threatening reactions occur in less than 1 in a million transfusions.  Infection with germs (bacteria), viruses or parasites like malaria can still happen. The risk is very low.  Hepatitis B occurs in about 1 case in 150,000 transfusions.  Hepatitis C is seen once in 500,000.  HIV is  transmitted less than once every million transfusions. When you receive a transfusion of packed red blood cells, your blood is tested for blood group and Rh type. Your blood is also screened for antibodies that could cause a serious reaction. A cross-match test is done to make sure the blood is safe to give.  Talk with your caregiver if you have any concerns about receiving a transfusion of blood products. Make sure your questions are answered. Transfusions are not given if your caregiver feels the risk is greater than the need. Document Released: 09/01/2005 Document Revised: 11/24/2011 Document Reviewed: 02/19/2007 [facility] Patient Information 2014 [place], [place]. Blood Transfusion Information WHAT IS A BLOOD TRANSFUSION? A transfusion is the replacement of blood or some of its parts. Blood is made up of multiple cells which provide different functions.  Red blood cells carry oxygen and are used for blood loss replacement.  White blood cells fight against infection.  Platelets control bleeding.  Plasma helps clot blood.  Other blood products are available for specialized needs, such as hemophilia or other clotting disorders. BEFORE THE TRANSFUSION  Who gives blood for transfusions?   You may be able to donate blood to be used at a later date on yourself (autologous donation).  Relatives can be asked to donate blood. This is generally not any safer than if you have received blood from a stranger. The same precautions are taken to ensure safety when a relative's blood is donated.  Healthy volunteers who are fully evaluated to make sure their blood is safe. This is blood bank blood. Transfusion therapy is the safest it  has ever been in the practice of medicine. Before blood is taken from a donor, a complete history is taken to make sure that person has no history of diseases nor engages in risky social behavior (examples are intravenous drug use or sexual activity with multiple partners).  The donor's travel history is screened to minimize risk of transmitting infections, such as malaria. The donated blood is tested for signs of infectious diseases, such as HIV and hepatitis. The blood is then tested to be sure it is compatible with you in order to minimize the chance of a transfusion reaction. If you or a relative donates blood, this is often done in anticipation of surgery and is not appropriate for emergency situations. It takes many days to process the donated blood. RISKS AND COMPLICATIONS Although transfusion therapy is very safe and saves many lives, the main dangers of transfusion include:   Getting an infectious disease.  Developing a transfusion reaction. This is an allergic reaction to something in the blood you were given. Every precaution is taken to prevent this. The decision to have a blood transfusion has been considered carefully by your caregiver before blood is given. Blood is not given unless the benefits outweigh the risks. AFTER THE TRANSFUSION  Right after receiving a blood transfusion, you will usually feel much better and more energetic. This is especially true if your red blood cells have gotten low (anemic). The transfusion raises the level of the red blood cells which carry oxygen, and this usually causes an energy increase.  The nurse administering the transfusion will monitor you carefully for complications. HOME CARE INSTRUCTIONS  No special instructions are needed after a transfusion. You may find your energy is better. Speak with your caregiver about any limitations on activity for underlying diseases you may have. SEEK MEDICAL CARE IF:   Your condition is not improving after your transfusion.  You develop redness or irritation at the intravenous (IV) site. SEEK IMMEDIATE MEDICAL CARE IF:  Any of the following symptoms occur over the next 12 hours:  Shaking chills.  You have a temperature by mouth above 102 F (38.9 C), not controlled by  medicine.  Chest, back, or muscle pain.  People around you feel you are not acting correctly or are confused.  Shortness of breath or difficulty breathing.  Dizziness and fainting.  You get a rash or develop hives.  You have a decrease in urine output.  Your urine turns a dark color or changes to pink, red, or brown. Any of the following symptoms occur over the next 10 days:  You have a temperature by mouth above 102 F (38.9 C), not controlled by medicine.  Shortness of breath.  Weakness after normal activity.  The white part of the eye turns yellow (jaundice).  You have a decrease in the amount of urine or are urinating less often.  Your urine turns a dark color or changes to pink, red, or brown. Document Released: 08/29/2000 Document Revised: 11/24/2011 Document Reviewed: 04/17/2008 [facility] Patient Information 2014 [place], [place]. Paclitaxel injection What is this medicine? PACLITAXEL (PAK li TAX el) is a chemotherapy drug. It targets fast dividing cells, like cancer cells, and causes these cells to die. This medicine is used to treat ovarian cancer, breast cancer, and other cancers. This medicine may be used for other purposes; ask your health care provider or pharmacist if you have questions. What should I tell my health care provider before I take this medicine? They need to know if you  have any of these conditions: -blood disorders -irregular heartbeat -infection (especially a virus infection such as chickenpox, cold sores, or herpes) -liver disease -previous or ongoing radiation therapy -an unusual or allergic reaction to paclitaxel, alcohol, polyoxyethylated castor oil, other chemotherapy agents, other medicines, foods, dyes, or preservatives -pregnant or trying to get pregnant -breast-feeding How should I use this medicine? This drug is given as an infusion into a vein. It is administered in a hospital or clinic by a specially trained health care  professional. Talk to your pediatrician regarding the use of this medicine in children. Special care may be needed. Overdosage: If you think you have taken too much of this medicine contact a poison control center or emergency room at once. NOTE: This medicine is only for you. Do not share this medicine with others. What if I miss a dose? It is important not to miss your dose. Call your doctor or health care professional if you are unable to keep an appointment. What may interact with this medicine? Do not take this medicine with any of the following medications: -disulfiram -metronidazole This medicine may also interact with the following medications: -cyclosporine -dexamethasone -diazepam -ketoconazole -medicines to increase blood counts like filgrastim, pegfilgrastim, sargramostim -other chemotherapy drugs like cisplatin, doxorubicin, epirubicin, etoposide, teniposide, vincristine -quinidine -testosterone -vaccines -verapamil Talk to your doctor or health care professional before taking any of these medicines: -acetaminophen -aspirin -ibuprofen -ketoprofen -naproxen This list may not describe all possible interactions. Give your health care provider a list of all the medicines, herbs, non-prescription drugs, or dietary supplements you use. Also tell them if you smoke, drink alcohol, or use illegal drugs. Some items may interact with your medicine. What should I watch for while using this medicine? Your condition will be monitored carefully while you are receiving this medicine. You will need important blood work done while you are taking this medicine. This drug may make you feel generally unwell. This is not uncommon, as chemotherapy can affect healthy cells as well as cancer cells. Report any side effects. Continue your course of treatment even though you feel ill unless your doctor tells you to stop. In some cases, you may be given additional medicines to help with side effects.  Follow all directions for their use. Call your doctor or health care professional for advice if you get a fever, chills or sore throat, or other symptoms of a cold or flu. Do not treat yourself. This drug decreases your body's ability to fight infections. Try to avoid being around people who are sick. This medicine may increase your risk to bruise or bleed. Call your doctor or health care professional if you notice any unusual bleeding. Be careful brushing and flossing your teeth or using a toothpick because you may get an infection or bleed more easily. If you have any dental work done, tell your dentist you are receiving this medicine. Avoid taking products that contain aspirin, acetaminophen, ibuprofen, naproxen, or ketoprofen unless instructed by your doctor. These medicines may hide a fever. Do not become pregnant while taking this medicine. Women should inform their doctor if they wish to become pregnant or think they might be pregnant. There is a potential for serious side effects to an unborn child. Talk to your health care professional or pharmacist for more information. Do not breast-feed an infant while taking this medicine. Men are advised not to father a child while receiving this medicine. What side effects may I notice from receiving this medicine? Side effects that you  should report to your doctor or health care professional as soon as possible: -allergic reactions like skin rash, itching or hives, swelling of the face, lips, or tongue -low blood counts - This drug may decrease the number of white blood cells, red blood cells and platelets. You may be at increased risk for infections and bleeding. -signs of infection - fever or chills, cough, sore throat, pain or difficulty passing urine -signs of decreased platelets or bleeding - bruising, pinpoint red spots on the skin, black, tarry stools, nosebleeds -signs of decreased red blood cells - unusually weak or tired, fainting spells,  lightheadedness -breathing problems -chest pain -high or low blood pressure -mouth sores -nausea and vomiting -pain, swelling, redness or irritation at the injection site -pain, tingling, numbness in the hands or feet -slow or irregular heartbeat -swelling of the ankle, feet, hands Side effects that usually do not require medical attention (report to your doctor or health care professional if they continue or are bothersome): -bone pain -complete hair loss including hair on your head, underarms, pubic hair, eyebrows, and eyelashes -changes in the color of fingernails -diarrhea -loosening of the fingernails -loss of appetite -muscle or joint pain -red flush to skin -sweating This list may not describe all possible side effects. Call your doctor for medical advice about side effects. You may report side effects to FDA at 1-800-FDA-1088. Where should I keep my medicine? This drug is given in a hospital or clinic and will not be stored at home. NOTE: This sheet is a summary. It may not cover all possible information. If you have questions about this medicine, talk to your doctor, pharmacist, or health care provider.  2013, [employer]. (08/14/2008 11:54:26 AM) Carboplatin injection What is this medicine? CARBOPLATIN (KAR boe pla tin) is a chemotherapy drug. It targets fast dividing cells, like cancer cells, and causes these cells to die. This medicine is used to treat ovarian cancer and many other cancers. This medicine may be used for other purposes; ask your health care provider or pharmacist if you have questions. What should I tell my health care provider before I take this medicine? They need to know if you have any of these conditions: -blood disorders -hearing problems -kidney disease -recent or ongoing radiation therapy -an unusual or allergic reaction to carboplatin, cisplatin, other chemotherapy, other medicines, foods, dyes, or preservatives -pregnant or trying  to get pregnant -breast-feeding How should I use this medicine? This drug is usually given as an infusion into a vein. It is administered in a hospital or clinic by a specially trained health care professional. Talk to your pediatrician regarding the use of this medicine in children. Special care may be needed. Overdosage: If you think you have taken too much of this medicine contact a poison control center or emergency room at once. NOTE: This medicine is only for you. Do not share this medicine with others. What if I miss a dose? It is important not to miss a dose. Call your doctor or health care professional if you are unable to keep an appointment. What may interact with this medicine? -medicines for seizures -medicines to increase blood counts like filgrastim, pegfilgrastim, sargramostim -some antibiotics like amikacin, gentamicin, neomycin, streptomycin, tobramycin -vaccines Talk to your doctor or health care professional before taking any of these medicines: -acetaminophen -aspirin -ibuprofen -ketoprofen -naproxen This list may not describe all possible interactions. Give your health care provider a list of all the medicines, herbs, non-prescription drugs, or dietary supplements you use. Also  tell them if you smoke, drink alcohol, or use illegal drugs. Some items may interact with your medicine. What should I watch for while using this medicine? Your condition will be monitored carefully while you are receiving this medicine. You will need important blood work done while you are taking this medicine. This drug may make you feel generally unwell. This is not uncommon, as chemotherapy can affect healthy cells as well as cancer cells. Report any side effects. Continue your course of treatment even though you feel ill unless your doctor tells you to stop. In some cases, you may be given additional medicines to help with side effects. Follow all directions for their use. Call your doctor  or health care professional for advice if you get a fever, chills or sore throat, or other symptoms of a cold or flu. Do not treat yourself. This drug decreases your body's ability to fight infections. Try to avoid being around people who are sick. This medicine may increase your risk to bruise or bleed. Call your doctor or health care professional if you notice any unusual bleeding. Be careful brushing and flossing your teeth or using a toothpick because you may get an infection or bleed more easily. If you have any dental work done, tell your dentist you are receiving this medicine. Avoid taking products that contain aspirin, acetaminophen, ibuprofen, naproxen, or ketoprofen unless instructed by your doctor. These medicines may hide a fever. Do not become pregnant while taking this medicine. Women should inform their doctor if they wish to become pregnant or think they might be pregnant. There is a potential for serious side effects to an unborn child. Talk to your health care professional or pharmacist for more information. Do not breast-feed an infant while taking this medicine. What side effects may I notice from receiving this medicine? Side effects that you should report to your doctor or health care professional as soon as possible: -allergic reactions like skin rash, itching or hives, swelling of the face, lips, or tongue -signs of infection - fever or chills, cough, sore throat, pain or difficulty passing urine -signs of decreased platelets or bleeding - bruising, pinpoint red spots on the skin, black, tarry stools, nosebleeds -signs of decreased red blood cells - unusually weak or tired, fainting spells, lightheadedness -breathing problems -changes in hearing -changes in vision -chest pain -high blood pressure -low blood counts - This drug may decrease the number of white blood cells, red blood cells and platelets. You may be at increased risk for infections and bleeding. -nausea and  vomiting -pain, swelling, redness or irritation at the injection site -pain, tingling, numbness in the hands or feet -problems with balance, talking, walking -trouble passing urine or change in the amount of urine Side effects that usually do not require medical attention (report to your doctor or health care professional if they continue or are bothersome): -hair loss -loss of appetite -metallic taste in the mouth or changes in taste This list may not describe all possible side effects. Call your doctor for medical advice about side effects. You may report side effects to FDA at 1-800-FDA-1088. Where should I keep my medicine? This drug is given in a hospital or clinic and will not be stored at home. NOTE: This sheet is a summary. It may not cover all possible information. If you have questions about this medicine, talk to your doctor, pharmacist, or health care provider.  2012, [employer]. (12/07/2007 2:38:05 PM)

## 2013-07-01 NOTE — Telephone Encounter (Signed)
appts made and printed...td 

## 2013-07-02 ENCOUNTER — Ambulatory Visit (HOSPITAL_BASED_OUTPATIENT_CLINIC_OR_DEPARTMENT_OTHER): Payer: Medicare Other

## 2013-07-02 VITALS — BP 106/64 | HR 70 | Temp 98.1°F | Resp 18

## 2013-07-02 DIAGNOSIS — D6481 Anemia due to antineoplastic chemotherapy: Secondary | ICD-10-CM

## 2013-07-02 DIAGNOSIS — C50112 Malignant neoplasm of central portion of left female breast: Secondary | ICD-10-CM

## 2013-07-02 MED ORDER — ACETAMINOPHEN 325 MG PO TABS
ORAL_TABLET | ORAL | Status: AC
  Filled 2013-07-02: qty 2

## 2013-07-02 MED ORDER — ACETAMINOPHEN 325 MG PO TABS
650.0000 mg | ORAL_TABLET | Freq: Once | ORAL | Status: AC
  Administered 2013-07-02: 650 mg via ORAL

## 2013-07-02 MED ORDER — SODIUM CHLORIDE 0.9 % IJ SOLN
10.0000 mL | INTRAMUSCULAR | Status: AC | PRN
  Administered 2013-07-02: 10 mL
  Filled 2013-07-02: qty 10

## 2013-07-02 MED ORDER — HEPARIN SOD (PORK) LOCK FLUSH 100 UNIT/ML IV SOLN
500.0000 [IU] | Freq: Every day | INTRAVENOUS | Status: AC | PRN
  Administered 2013-07-02: 500 [IU]
  Filled 2013-07-02: qty 5

## 2013-07-02 MED ORDER — DIPHENHYDRAMINE HCL 25 MG PO CAPS
25.0000 mg | ORAL_CAPSULE | Freq: Once | ORAL | Status: AC
  Administered 2013-07-02: 25 mg via ORAL

## 2013-07-02 MED ORDER — SODIUM CHLORIDE 0.9 % IV SOLN
250.0000 mL | Freq: Once | INTRAVENOUS | Status: AC
  Administered 2013-07-02: 250 mL via INTRAVENOUS

## 2013-07-02 MED ORDER — DIPHENHYDRAMINE HCL 25 MG PO CAPS
ORAL_CAPSULE | ORAL | Status: AC
  Filled 2013-07-02: qty 1

## 2013-07-02 NOTE — Patient Instructions (Signed)
Blood Transfusion Information WHAT IS A BLOOD TRANSFUSION? A transfusion is the replacement of blood or some of its parts. Blood is made up of multiple cells which provide different functions.  Red blood cells carry oxygen and are used for blood loss replacement.  White blood cells fight against infection.  Platelets control bleeding.  Plasma helps clot blood.  Other blood products are available for specialized needs, such as hemophilia or other clotting disorders. BEFORE THE TRANSFUSION  Who gives blood for transfusions?   You may be able to donate blood to be used at a later date on yourself (autologous donation).  Relatives can be asked to donate blood. This is generally not any safer than if you have received blood from a stranger. The same precautions are taken to ensure safety when a relative's blood is donated.  Healthy volunteers who are fully evaluated to make sure their blood is safe. This is blood bank blood. Transfusion therapy is the safest it has ever been in the practice of medicine. Before blood is taken from a donor, a complete history is taken to make sure that person has no history of diseases nor engages in risky social behavior (examples are intravenous drug use or sexual activity with multiple partners). The donor's travel history is screened to minimize risk of transmitting infections, such as malaria. The donated blood is tested for signs of infectious diseases, such as HIV and hepatitis. The blood is then tested to be sure it is compatible with you in order to minimize the chance of a transfusion reaction. If you or a relative donates blood, this is often done in anticipation of surgery and is not appropriate for emergency situations. It takes many days to process the donated blood. RISKS AND COMPLICATIONS Although transfusion therapy is very safe and saves many lives, the main dangers of transfusion include:   Getting an infectious disease.  Developing a  transfusion reaction. This is an allergic reaction to something in the blood you were given. Every precaution is taken to prevent this. The decision to have a blood transfusion has been considered carefully by your caregiver before blood is given. Blood is not given unless the benefits outweigh the risks. AFTER THE TRANSFUSION  Right after receiving a blood transfusion, you will usually feel much better and more energetic. This is especially true if your red blood cells have gotten low (anemic). The transfusion raises the level of the red blood cells which carry oxygen, and this usually causes an energy increase.  The nurse administering the transfusion will monitor you carefully for complications. HOME CARE INSTRUCTIONS  No special instructions are needed after a transfusion. You may find your energy is better. Speak with your caregiver about any limitations on activity for underlying diseases you may have. SEEK MEDICAL CARE IF:   Your condition is not improving after your transfusion.  You develop redness or irritation at the intravenous (IV) site. SEEK IMMEDIATE MEDICAL CARE IF:  Any of the following symptoms occur over the next 12 hours:  Shaking chills.  You have a temperature by mouth above 102 F (38.9 C), not controlled by medicine.  Chest, back, or muscle pain.  People around you feel you are not acting correctly or are confused.  Shortness of breath or difficulty breathing.  Dizziness and fainting.  You get a rash or develop hives.  You have a decrease in urine output.  Your urine turns a dark color or changes to pink, red, or brown. Any of the following   symptoms occur over the next 10 days:  You have a temperature by mouth above 102 F (38.9 C), not controlled by medicine.  Shortness of breath.  Weakness after normal activity.  The white part of the eye turns yellow (jaundice).  You have a decrease in the amount of urine or are urinating less often.  Your  urine turns a dark color or changes to pink, red, or brown. Document Released: 08/29/2000 Document Revised: 11/24/2011 Document Reviewed: 04/17/2008 [employer] Patient Information 2014 [employer].  

## 2013-07-03 LAB — TYPE AND SCREEN
Unit division: 0
Unit division: 0

## 2013-07-08 ENCOUNTER — Encounter: Payer: Self-pay | Admitting: Family

## 2013-07-08 ENCOUNTER — Ambulatory Visit (HOSPITAL_BASED_OUTPATIENT_CLINIC_OR_DEPARTMENT_OTHER): Payer: Medicare Other

## 2013-07-08 ENCOUNTER — Ambulatory Visit (HOSPITAL_BASED_OUTPATIENT_CLINIC_OR_DEPARTMENT_OTHER): Payer: Medicare Other | Admitting: Family

## 2013-07-08 ENCOUNTER — Other Ambulatory Visit (HOSPITAL_BASED_OUTPATIENT_CLINIC_OR_DEPARTMENT_OTHER): Payer: Medicare Other | Admitting: Lab

## 2013-07-08 VITALS — BP 112/67 | HR 62 | Resp 18

## 2013-07-08 VITALS — BP 147/75 | HR 74 | Temp 98.2°F | Resp 18 | Ht <= 58 in | Wt 103.1 lb

## 2013-07-08 DIAGNOSIS — C50112 Malignant neoplasm of central portion of left female breast: Secondary | ICD-10-CM

## 2013-07-08 DIAGNOSIS — Z171 Estrogen receptor negative status [ER-]: Secondary | ICD-10-CM

## 2013-07-08 DIAGNOSIS — C50912 Malignant neoplasm of unspecified site of left female breast: Secondary | ICD-10-CM

## 2013-07-08 DIAGNOSIS — C50919 Malignant neoplasm of unspecified site of unspecified female breast: Secondary | ICD-10-CM

## 2013-07-08 DIAGNOSIS — Z5111 Encounter for antineoplastic chemotherapy: Secondary | ICD-10-CM

## 2013-07-08 LAB — COMPREHENSIVE METABOLIC PANEL (CC13)
Alkaline Phosphatase: 92 U/L (ref 40–150)
Anion Gap: 9 mEq/L (ref 3–11)
BUN: 6.4 mg/dL — ABNORMAL LOW (ref 7.0–26.0)
CO2: 24 mEq/L (ref 22–29)
Calcium: 9.6 mg/dL (ref 8.4–10.4)
Chloride: 108 mEq/L (ref 98–109)
Creatinine: 0.7 mg/dL (ref 0.6–1.1)

## 2013-07-08 LAB — CBC WITH DIFFERENTIAL/PLATELET
Eosinophils Absolute: 0 10*3/uL (ref 0.0–0.5)
HCT: 36.1 % (ref 34.8–46.6)
LYMPH%: 11.1 % — ABNORMAL LOW (ref 14.0–49.7)
MCH: 29.5 pg (ref 25.1–34.0)
MCHC: 32.4 g/dL (ref 31.5–36.0)
MCV: 90.9 fL (ref 79.5–101.0)
MONO#: 1.6 10*3/uL — ABNORMAL HIGH (ref 0.1–0.9)
MONO%: 13.6 % (ref 0.0–14.0)
NEUT%: 74.6 % (ref 38.4–76.8)
Platelets: 252 10*3/uL (ref 145–400)
RDW: 15.8 % — ABNORMAL HIGH (ref 11.2–14.5)
WBC: 11.7 10*3/uL — ABNORMAL HIGH (ref 3.9–10.3)

## 2013-07-08 MED ORDER — SODIUM CHLORIDE 0.9 % IJ SOLN
10.0000 mL | INTRAMUSCULAR | Status: DC | PRN
  Filled 2013-07-08: qty 10

## 2013-07-08 MED ORDER — PACLITAXEL CHEMO INJECTION 300 MG/50ML
80.0000 mg/m2 | Freq: Once | INTRAVENOUS | Status: AC
  Administered 2013-07-08: 108 mg via INTRAVENOUS
  Filled 2013-07-08: qty 18

## 2013-07-08 MED ORDER — SODIUM CHLORIDE 0.9 % IV SOLN
Freq: Once | INTRAVENOUS | Status: AC
  Administered 2013-07-08: 10:00:00 via INTRAVENOUS

## 2013-07-08 MED ORDER — FAMOTIDINE IN NACL 20-0.9 MG/50ML-% IV SOLN
INTRAVENOUS | Status: AC
  Filled 2013-07-08: qty 50

## 2013-07-08 MED ORDER — DEXAMETHASONE SODIUM PHOSPHATE 20 MG/5ML IJ SOLN
20.0000 mg | Freq: Once | INTRAMUSCULAR | Status: AC
  Administered 2013-07-08: 20 mg via INTRAVENOUS

## 2013-07-08 MED ORDER — HEPARIN SOD (PORK) LOCK FLUSH 100 UNIT/ML IV SOLN
500.0000 [IU] | Freq: Once | INTRAVENOUS | Status: DC | PRN
  Filled 2013-07-08: qty 5

## 2013-07-08 MED ORDER — ONDANSETRON 16 MG/50ML IVPB (CHCC)
INTRAVENOUS | Status: AC
  Filled 2013-07-08: qty 16

## 2013-07-08 MED ORDER — FAMOTIDINE IN NACL 20-0.9 MG/50ML-% IV SOLN
20.0000 mg | Freq: Once | INTRAVENOUS | Status: AC
  Administered 2013-07-08: 20 mg via INTRAVENOUS

## 2013-07-08 MED ORDER — SODIUM CHLORIDE 0.9 % IV SOLN
132.0000 mg | Freq: Once | INTRAVENOUS | Status: AC
  Administered 2013-07-08: 130 mg via INTRAVENOUS
  Filled 2013-07-08: qty 13

## 2013-07-08 MED ORDER — DEXAMETHASONE SODIUM PHOSPHATE 20 MG/5ML IJ SOLN
INTRAMUSCULAR | Status: AC
  Filled 2013-07-08: qty 5

## 2013-07-08 MED ORDER — DIPHENHYDRAMINE HCL 50 MG/ML IJ SOLN
50.0000 mg | Freq: Once | INTRAMUSCULAR | Status: AC
  Administered 2013-07-08: 50 mg via INTRAVENOUS

## 2013-07-08 MED ORDER — DIPHENHYDRAMINE HCL 50 MG/ML IJ SOLN
INTRAMUSCULAR | Status: AC
  Filled 2013-07-08: qty 1

## 2013-07-08 MED ORDER — ONDANSETRON 16 MG/50ML IVPB (CHCC)
16.0000 mg | Freq: Once | INTRAVENOUS | Status: AC
  Administered 2013-07-08: 16 mg via INTRAVENOUS

## 2013-07-08 NOTE — Progress Notes (Signed)
[facility]  [phone] [phone]  OFFICE PROGRESS NOTE   ID: Ellen Beltran   DOB: 1946/09/09  MR#: [id number]  JYN#:[id number]   PCP: [name], MD   DIAGNOSIS: Ellen Beltran is a 67 y.o. female diagnosed in 04/2013 with a clinical stage IIA, estrogen receptor negative, progesterone receptor negative, HER-2/neu neg, invasive ductal carcinoma of the left breast.   PRIOR THERAPY: 1. Patient went for annual screening mammogram in 03/2013 and mammography noted a mass at the 12 o'clock position. Diagnostic mammogram and ultrasound of the left breast was performed on 03/30/2013.  The ultrasound showed a 1.8 cm mass in the 12 o'clock position of the left breast.  Bilateral breast MRI performed on 04/25/2013 showed a 2.4 cm area of concern in the 12 o'clock position of the left breast. There were no abnormal appearing axillary subpectoral or internal mammary lymph nodes.  Left breast needle core biopsy performed on 04/15/2013 revealed invasive carcinoma with papillary features consistent with a ductal phenotype. Tumor grade was intermediate to high grade, estrogen receptor negative, progesterone receptor negative, with a proliferation marker Ki-67 of 47%, HER-2/neu by CISH showed no amplification.  2.  2-D echocardiogram performed on 05/06/2013 showed a left ventricular ejection fraction of 60%.  3. Neoadjuvant chemotherapy consisting of AC (Adriamycin/Cytoxan) began on 05/13/2013 through 06/24/2013 x 4 cycles with Neulasta injection for granulocyte support administered on day 2 of each cycle.    4.  Neoadjuvant chemotherapy consisting of Taxol/Carboplatin x 12 weekly cycles scheduled to start on 07/08/2013.   CURRENT THERAPY: Neoadjuvant chemotherapy with Taxol/Carboplatin cycle 1 scheduled to start today.   INTERVAL HISTORY: Ellen Beltran 67 y.o. female returns for physical examination and laboratory assessment prior to proceeding with  neoadjuvant chemotherapy cycle #1 consisting of Taxol/Carboplatin today.  She is accompanied for today's office visit by her husband Ellen Beltran.  Since her last office visit on 07/01/2013 her interval history is significant for receiving a blood transfusion of 2 units of packed red blood cells on 07/02/2013.  She tolerated the transfusions well.  Her fatigue, anemia and neutropenia have all improved.  Her interval history is otherwise unremarkable.   MEDICAL HISTORY: Past Medical History  Diagnosis Date  . Hypertension   . Hyperlipidemia   . Osteopenia   . Arthritis   . Hot flashes   . Cancer     left breast    ALLERGIES:   Allergies  Allergen Reactions  . Codeine Other (See Comments)    Sees unusual things  . Morphine And Related Nausea Only  . Sulfa Antibiotics Rash    MEDICATIONS:  Current Outpatient Prescriptions  Medication Sig Dispense Refill  . dexamethasone (DECADRON) 4 MG tablet Take 8 mg by mouth 2 (two) times daily. BID x 3 days after chemo      . lidocaine-prilocaine (EMLA) cream Apply topically as needed.  30 g  6  . LORazepam (ATIVAN) 0.5 MG tablet Take 1 tablet (0.5 mg total) by mouth every 6 (six) hours as needed (Nausea or vomiting).  30 tablet  0  . ondansetron (ZOFRAN) 8 MG tablet Take 8 mg by mouth every 8 (eight) hours as needed (Take 72 hours after chemotherapy).       Marland Kitchen PRESCRIPTION MEDICATION Chemotherapy at [facility].      Marland Kitchen prochlorperazine (COMPAZINE) 10 MG tablet Take 10 mg by mouth every 8 (eight) hours as needed.       . simvastatin (ZOCOR) 40 MG tablet Take 40 mg by  mouth every evening.      . ciprofloxacin (CIPRO) 500 MG tablet Take 1 tablet (500 mg total) by mouth 2 (two) times daily.  14 tablet  0   No current facility-administered medications for this visit.    SURGICAL HISTORY:  Past Surgical History  Procedure Laterality Date  . Cardiac catheterization  2004  . Tubal ligation  age 54  . Total abdominal hysterectomy  age 41 or 29  . Breast biopsy  Left aug 2014  . Portacath placement N/A 05/02/2013    Procedure: INSERTION PORT-A-CATH;  Surgeon: Kandis Cocking, MD;  Location: [facility];  Service: General;  Laterality: N/A;    REVIEW OF SYSTEMS:  A 10 point review of systems was completed and is negative except as noted above.  Ellen Beltran denies any other symptomatology including fatigue, fever or chills, headache, vision changes, swollen glands, cough or shortness of breath, chest pain or discomfort, nausea, vomiting, diarrhea, constipation, change in urinary or bowel habits, arthralgias/myalgias, unusual bleeding/bruising or any other symptomatology.   PHYSICAL EXAMINATION: Blood pressure 147/75, pulse 74, temperature 98.2 F (36.8 C), resp. rate 18, height 4' 9.5" (1.461 m), weight 103 lb 1.6 oz (46.766 kg). Body mass index is 21.91 kg/(m^2).  ECOG PERFORMANCE STATUS: 0 - Asymptomatic  General appearance: Alert, cooperative, well nourished, thin frame, no apparent distress Head: Normocephalic, without obvious abnormality, atraumatic Eyes: Mild arcus senilis, PERRLA, EOMI Nose: Nares, septum and mucosa are normal, no drainage or sinus tenderness Neck: No adenopathy, supple, symmetrical, trachea midline, no tenderness Resp: Clear to auscultation bilaterally, no wheezes/rales/rhonchi Cardio: Regular rate and rhythm, S1, S2 normal, no murmur, click, rub or gallop, no edema, right chest Port-A-Cath covered in EMLA cream with occlusive dressing Breasts:  Deferred GI: Soft, not distended, non-tender, hypoactive bowel sounds, no organomegaly Skin: No rashes/lesions, skin warm and dry, no erythematous areas, no cyanosis  M/S:  Atraumatic, normal strength in all extremities, normal range of motion, no clubbing  Lymph nodes: Cervical, supraclavicular, and axillary nodes normal Neurologic: Grossly normal, cranial nerves II through XII intact, alert and oriented x 3 Psych: Appropriate affect   LABORATORY DATA: Lab Results  Component Value  Date   WBC 11.7* 07/08/2013   HGB 11.7 07/08/2013   HCT 36.1 07/08/2013   MCV 90.9 07/08/2013   PLT 252 07/08/2013      Chemistry      Component Value Date/Time   NA 142 07/08/2013 0852   NA 137 05/22/2013 0246   K 3.7 07/08/2013 0852   K 4.2 05/22/2013 0246   CL 108 05/22/2013 0246   CO2 24 07/08/2013 0852   CO2 23 05/22/2013 0246   BUN 6.4* 07/08/2013 0852   BUN 13 05/22/2013 0246   CREATININE 0.7 07/08/2013 0852   CREATININE 0.62 05/22/2013 0246      Component Value Date/Time   CALCIUM 9.6 07/08/2013 0852   CALCIUM 8.6 05/22/2013 0246   ALKPHOS 92 07/08/2013 0852   ALKPHOS 75 05/19/2013 1856   AST 17 07/08/2013 0852   AST 8 05/19/2013 1856   ALT 30 07/08/2013 0852   ALT 11 05/19/2013 1856   BILITOT <0.20 07/08/2013 0852   BILITOT 0.7 05/19/2013 1856       RADIOGRAPHIC STUDIES: No results found.    ASSESSMENT: LILAS DIEFENDORF is a 67 y.o. female with:  #1 Screening mammogram in 03/2013 noted a mass at the 12 o'clock position.  Diagnostic mammogram and ultrasound of the left breast was performed on 03/30/2013.  The  ultrasound showed a 1.8 cm mass in the 12 o'clock position of the left breast. Bilateral breast MRI  performed on 04/25/2013 showed a 2.4 cm area of concern in the 12 o'clock position of the left breast. There were no abnormal appearing axillary subpectoral or internal mammary lymph nodes.  Left breast needle core biopsy performed on 04/15/2013 revealed invasive carcinoma with papillary features consistent with a ductal phenotype. Tumor grade was intermediate to high grade, estrogen receptor negative, progesterone receptor negative, with a proliferation marker Ki-67 of 47%, HER-2/neu by CISH showed no amplification.  #2  2-D echocardiogram performed on 05/06/2013 showed a left ventricular ejection fraction of 60%.  #3 Neoadjuvant chemotherapy consisting of AC (Adriamycin/Cytoxan) began on 05/13/2013 through 06/24/2013 x 4 cycles with Neulasta injection for granulocyte support  administered on day 2 of each cycle.    #4  Neoadjuvant chemotherapy consisting of Taxol/Carboplatin x 12 weekly cycles scheduled to start on 07/08/2013.   PLAN:  #1 Ellen Beltran will proceed with neoadjuvant chemotherapy cycle #1 consisting of Taxol/Carboplatin today.  Risk and benefits of chemotherapy have previously been explained to her and her husband.    #2 We plan to see Ellen Beltran again on 07/15/2013 for neoadjuvant chemotherapy cycle #2 of 12 weekly cycles consisting of Taxol/Carboplatin.   All questions answered.  Mr. and Ellen Beltran were encouraged to contact us in the interim with any questions, concerns, or problems.   Larina Bras, NP-C 07/10/2013  4:28 PM

## 2013-07-08 NOTE — Patient Instructions (Signed)
Please contact us at [phone] if you have any questions or concerns.  Please continue to do well and enjoy life!!!  Get plenty of rest, drink plenty of water, exercise daily (walking), eat a balanced diet.  Consider taking a multi-vitamin with a B complex or B complex vitamin if you have neuropathy.  Results for orders placed in visit on 07/08/13 (from the past 24 hour(s))  CBC WITH DIFFERENTIAL     Status: Abnormal   Collection Time    07/08/13  8:53 AM      Result Value Range   WBC 11.7 (*) 3.9 - 10.3 10e3/uL   NEUT# 8.7 (*) 1.5 - 6.5 10e3/uL   HGB 11.7  11.6 - 15.9 g/dL   HCT 16.1  09.6 - 04.5 %   Platelets 252  145 - 400 10e3/uL   MCV 90.9  79.5 - 101.0 fL   MCH 29.5  25.1 - 34.0 pg   MCHC 32.4  31.5 - 36.0 g/dL   RBC 4.09  8.11 - 9.14 10e6/uL   RDW 15.8 (*) 11.2 - 14.5 %   lymph# 1.3  0.9 - 3.3 10e3/uL   MONO# 1.6 (*) 0.1 - 0.9 10e3/uL   Eosinophils Absolute 0.0  0.0 - 0.5 10e3/uL   Basophils Absolute 0.1  0.0 - 0.1 10e3/uL   NEUT% 74.6  38.4 - 76.8 %   LYMPH% 11.1 (*) 14.0 - 49.7 %   MONO% 13.6  0.0 - 14.0 %   EOS% 0.0  0.0 - 7.0 %   BASO% 0.7  0.0 - 2.0 %   nRBC 0  0 - 0 %   Narrative:    Performed At:  [facility]               [address]               [place], [place] [address]

## 2013-07-15 ENCOUNTER — Ambulatory Visit (HOSPITAL_BASED_OUTPATIENT_CLINIC_OR_DEPARTMENT_OTHER): Payer: Medicare Other

## 2013-07-15 ENCOUNTER — Encounter: Payer: Self-pay | Admitting: Family

## 2013-07-15 ENCOUNTER — Other Ambulatory Visit (HOSPITAL_BASED_OUTPATIENT_CLINIC_OR_DEPARTMENT_OTHER): Payer: Medicare Other | Admitting: Lab

## 2013-07-15 ENCOUNTER — Ambulatory Visit (HOSPITAL_BASED_OUTPATIENT_CLINIC_OR_DEPARTMENT_OTHER): Payer: Medicare Other | Admitting: Family

## 2013-07-15 VITALS — BP 147/81 | HR 98 | Temp 97.9°F | Resp 18 | Ht <= 58 in | Wt 104.7 lb

## 2013-07-15 DIAGNOSIS — Z171 Estrogen receptor negative status [ER-]: Secondary | ICD-10-CM

## 2013-07-15 DIAGNOSIS — C50119 Malignant neoplasm of central portion of unspecified female breast: Secondary | ICD-10-CM

## 2013-07-15 DIAGNOSIS — C50919 Malignant neoplasm of unspecified site of unspecified female breast: Secondary | ICD-10-CM

## 2013-07-15 DIAGNOSIS — C50112 Malignant neoplasm of central portion of left female breast: Secondary | ICD-10-CM

## 2013-07-15 DIAGNOSIS — Z5111 Encounter for antineoplastic chemotherapy: Secondary | ICD-10-CM

## 2013-07-15 DIAGNOSIS — C50912 Malignant neoplasm of unspecified site of left female breast: Secondary | ICD-10-CM

## 2013-07-15 LAB — CBC WITH DIFFERENTIAL/PLATELET
Basophils Absolute: 0.1 10*3/uL (ref 0.0–0.1)
EOS%: 0.1 % (ref 0.0–7.0)
Eosinophils Absolute: 0 10*3/uL (ref 0.0–0.5)
HGB: 11.8 g/dL (ref 11.6–15.9)
MONO#: 0.9 10*3/uL (ref 0.1–0.9)
NEUT#: 6.1 10*3/uL (ref 1.5–6.5)
NEUT%: 76 % (ref 38.4–76.8)
Platelets: 433 10*3/uL — ABNORMAL HIGH (ref 145–400)
RDW: 15.9 % — ABNORMAL HIGH (ref 11.2–14.5)
WBC: 8 10*3/uL (ref 3.9–10.3)
lymph#: 1 10*3/uL (ref 0.9–3.3)

## 2013-07-15 LAB — COMPREHENSIVE METABOLIC PANEL (CC13)
AST: 17 U/L (ref 5–34)
Albumin: 3.4 g/dL — ABNORMAL LOW (ref 3.5–5.0)
Alkaline Phosphatase: 76 U/L (ref 40–150)
Anion Gap: 10 mEq/L (ref 3–11)
BUN: 10.9 mg/dL (ref 7.0–26.0)
CO2: 25 mEq/L (ref 22–29)
Calcium: 9.7 mg/dL (ref 8.4–10.4)
Chloride: 105 mEq/L (ref 98–109)
Glucose: 117 mg/dl (ref 70–140)
Potassium: 4.6 mEq/L (ref 3.5–5.1)

## 2013-07-15 MED ORDER — DEXAMETHASONE SODIUM PHOSPHATE 20 MG/5ML IJ SOLN
INTRAMUSCULAR | Status: AC
  Filled 2013-07-15: qty 5

## 2013-07-15 MED ORDER — ONDANSETRON 16 MG/50ML IVPB (CHCC)
INTRAVENOUS | Status: AC
  Filled 2013-07-15: qty 16

## 2013-07-15 MED ORDER — DIPHENHYDRAMINE HCL 50 MG/ML IJ SOLN
INTRAMUSCULAR | Status: AC
  Filled 2013-07-15: qty 1

## 2013-07-15 MED ORDER — SODIUM CHLORIDE 0.9 % IV SOLN
Freq: Once | INTRAVENOUS | Status: AC
  Administered 2013-07-15: 11:00:00 via INTRAVENOUS

## 2013-07-15 MED ORDER — FAMOTIDINE IN NACL 20-0.9 MG/50ML-% IV SOLN
INTRAVENOUS | Status: AC
  Filled 2013-07-15: qty 50

## 2013-07-15 MED ORDER — ONDANSETRON 16 MG/50ML IVPB (CHCC)
16.0000 mg | Freq: Once | INTRAVENOUS | Status: AC
  Administered 2013-07-15: 16 mg via INTRAVENOUS

## 2013-07-15 MED ORDER — SODIUM CHLORIDE 0.9 % IJ SOLN
10.0000 mL | INTRAMUSCULAR | Status: DC | PRN
  Administered 2013-07-15: 10 mL
  Filled 2013-07-15: qty 10

## 2013-07-15 MED ORDER — HEPARIN SOD (PORK) LOCK FLUSH 100 UNIT/ML IV SOLN
500.0000 [IU] | Freq: Once | INTRAVENOUS | Status: AC | PRN
  Administered 2013-07-15: 500 [IU]
  Filled 2013-07-15: qty 5

## 2013-07-15 MED ORDER — DEXAMETHASONE SODIUM PHOSPHATE 20 MG/5ML IJ SOLN
20.0000 mg | Freq: Once | INTRAMUSCULAR | Status: AC
  Administered 2013-07-15: 20 mg via INTRAVENOUS

## 2013-07-15 MED ORDER — DIPHENHYDRAMINE HCL 50 MG/ML IJ SOLN
50.0000 mg | Freq: Once | INTRAMUSCULAR | Status: AC
  Administered 2013-07-15: 50 mg via INTRAVENOUS

## 2013-07-15 MED ORDER — PACLITAXEL CHEMO INJECTION 300 MG/50ML
80.0000 mg/m2 | Freq: Once | INTRAVENOUS | Status: AC
  Administered 2013-07-15: 108 mg via INTRAVENOUS
  Filled 2013-07-15: qty 18

## 2013-07-15 MED ORDER — DEXAMETHASONE 4 MG PO TABS: 8.0000 mg | ORAL_TABLET | Freq: Two times a day (BID) | ORAL | Status: DC

## 2013-07-15 MED ORDER — SODIUM CHLORIDE 0.9 % IV SOLN
132.0000 mg | Freq: Once | INTRAVENOUS | Status: AC
  Administered 2013-07-15: 130 mg via INTRAVENOUS
  Filled 2013-07-15: qty 13

## 2013-07-15 MED ORDER — FAMOTIDINE IN NACL 20-0.9 MG/50ML-% IV SOLN
20.0000 mg | Freq: Once | INTRAVENOUS | Status: AC
  Administered 2013-07-15: 20 mg via INTRAVENOUS

## 2013-07-15 NOTE — Progress Notes (Signed)
[facility]  [phone] [phone]  OFFICE PROGRESS NOTE    ID: Ellen Beltran   DOB: [birth year]  MR#: [id number]  QIH#:[id number]   PCP: Ellen Hoit, MD   DIAGNOSIS: Ellen Beltran is a 67 y.o. female diagnosed in 04/2013 with a clinical stage IIA, estrogen receptor negative, progesterone receptor negative, HER-2/neu neg, invasive ductal carcinoma of the left breast.   PRIOR THERAPY: 1. Patient went for annual screening mammogram in 03/2013 and mammography noted a mass at the 12 o'clock position. Diagnostic mammogram and ultrasound of the left breast was performed on 03/30/2013.  The ultrasound showed a 1.8 cm mass in the 12 o'clock position of the left breast.  Bilateral breast MRI performed on 04/25/2013 showed a 2.4 cm area of concern in the 12 o'clock position of the left breast. There were no abnormal appearing axillary subpectoral or internal mammary lymph nodes.  Left breast needle core biopsy performed on 04/15/2013 revealed invasive carcinoma with papillary features consistent with a ductal phenotype. Tumor grade was intermediate to high grade, estrogen receptor negative, progesterone receptor negative, with a proliferation marker Ki-67 of 47%, HER-2/neu by CISH showed no amplification.  2.  2-D echocardiogram performed on 05/06/2013 showed a left ventricular ejection fraction of 60%.  3. Neoadjuvant chemotherapy consisting of AC (Adriamycin/Cytoxan) began on 05/13/2013 through 06/24/2013 x 4 cycles with Neulasta injection for granulocyte support administered on day 2 of each cycle.    4.  Neoadjuvant chemotherapy consisting of Taxol/Carboplatin x 12 weekly cycles scheduled to start on 07/08/2013.   CURRENT THERAPY: Neoadjuvant chemotherapy with Taxol/Carboplatin cycle 2 scheduled to start today.   INTERVAL HISTORY: Ellen Beltran 67 y.o. female returns for physical examination and laboratory assessment prior to proceeding with  neoadjuvant chemotherapy cycle #2 consisting of Taxol/Carboplatin today.  She is accompanied for today's office visit by her husband Ellen Beltran.  Since her last office visit on 07/08/2013 her interval history is significant for increased fatigue after receiving neoadjuvant chemotherapy cycle #1 of Taxol/Carboplatin.  Increased exercise ( daily walking as tolerated) was encouraged for chemotherapy induced fatigue.  Otherwise, she tolerated her chemotherapy infusion well.  Noted today he is an increase in her platelets at 433. We will continue to monitor.  Her interval history is otherwise unremarkable.   MEDICAL HISTORY: Past Medical History  Diagnosis Date  . Hypertension   . Hyperlipidemia   . Osteopenia   . Arthritis   . Hot flashes   . Cancer     left breast    ALLERGIES:   Allergies  Allergen Reactions  . Codeine Other (See Comments)    Sees unusual things  . Morphine And Related Nausea Only  . Sulfa Antibiotics Rash    MEDICATIONS:  Current Outpatient Prescriptions  Medication Sig Dispense Refill  . dexamethasone (DECADRON) 4 MG tablet Take 2 tablets (8 mg total) by mouth 2 (two) times daily. BID x 3 days after chemo  60 tablet  1  . lidocaine-prilocaine (EMLA) cream Apply topically as needed.  30 g  6  . LORazepam (ATIVAN) 0.5 MG tablet Take 1 tablet (0.5 mg total) by mouth every 6 (six) hours as needed (Nausea or vomiting).  30 tablet  0  . ondansetron (ZOFRAN) 8 MG tablet Take 8 mg by mouth every 8 (eight) hours as needed (Take 72 hours after chemotherapy).       Marland Kitchen PRESCRIPTION MEDICATION Chemotherapy at [facility].      Marland Kitchen prochlorperazine (COMPAZINE) 10 MG tablet Take  10 mg by mouth every 8 (eight) hours as needed.       . simvastatin (ZOCOR) 40 MG tablet Take 40 mg by mouth every evening.      . ciprofloxacin (CIPRO) 500 MG tablet Take 1 tablet (500 mg total) by mouth 2 (two) times daily.  14 tablet  0   No current facility-administered medications for this visit.    SURGICAL  HISTORY:  Past Surgical History  Procedure Laterality Date  . Cardiac catheterization  2004  . Tubal ligation  age 68  . Total abdominal hysterectomy  age 25 or 14  . Breast biopsy Left aug 2014  . Portacath placement N/A 05/02/2013    Procedure: INSERTION PORT-A-CATH;  Surgeon: Kandis Cocking, MD;  Location: [facility];  Service: General;  Laterality: N/A;    REVIEW OF SYSTEMS:  A 10 point review of systems was completed and is negative except as noted above.  Ellen Beltran denies any other symptomatology including fatigue, fever or chills, headache, vision changes, swollen glands, cough or shortness of breath, chest pain or discomfort, nausea, vomiting, diarrhea, constipation, change in urinary or bowel habits, arthralgias/myalgias, unusual bleeding/bruising or any other symptomatology.   PHYSICAL EXAMINATION: Blood pressure 147/81, pulse 98, temperature 97.9 F (36.6 C), temperature source Oral, resp. rate 18, height 4\' 9"  (1.448 m), weight 104 lb 11.2 oz (47.492 kg). Body mass index is 22.65 kg/(m^2).  ECOG PERFORMANCE STATUS: 1 - Symptomatic but completely ambulatory  General appearance: Alert, cooperative, well nourished, thin frame, no apparent distress Head: Normocephalic, without obvious abnormality, atraumatic Eyes: Mild arcus senilis, PERRLA, EOMI Nose: Nares, septum and mucosa are normal, no drainage or sinus tenderness Neck: No adenopathy, supple, symmetrical, trachea midline, no tenderness Resp: Clear to auscultation bilaterally, no wheezes/rales/rhonchi Cardio: Regular rate and rhythm, S1, S2 normal, no murmur, click, rub or gallop, no edema, right chest Port-A-Cath covered in EMLA cream with occlusive dressing Breasts:  Deferred GI: Soft, not distended, non-tender, hypoactive bowel sounds, no organomegaly Skin: No rashes/lesions, skin warm and dry, no erythematous areas, no cyanosis  M/S:  Atraumatic, normal strength in all extremities, normal range of motion, no clubbing    Lymph nodes: Cervical, supraclavicular, and axillary nodes normal Neurologic: Grossly normal, cranial nerves II through XII intact, alert and oriented x 3 Psych: Appropriate affect   LABORATORY DATA: Lab Results  Component Value Date   WBC 8.0 07/15/2013   HGB 11.8 07/15/2013   HCT 35.5 07/15/2013   MCV 89.4 07/15/2013   PLT 433* 07/15/2013      Chemistry      Component Value Date/Time   NA 141 07/15/2013 0954   NA 137 05/22/2013 0246   K 4.6 07/15/2013 0954   K 4.2 05/22/2013 0246   CL 108 05/22/2013 0246   CO2 25 07/15/2013 0954   CO2 23 05/22/2013 0246   BUN 10.9 07/15/2013 0954   BUN 13 05/22/2013 0246   CREATININE 0.7 07/15/2013 0954   CREATININE 0.62 05/22/2013 0246      Component Value Date/Time   CALCIUM 9.7 07/15/2013 0954   CALCIUM 8.6 05/22/2013 0246   ALKPHOS 76 07/15/2013 0954   ALKPHOS 75 05/19/2013 1856   AST 17 07/15/2013 0954   AST 8 05/19/2013 1856   ALT 26 07/15/2013 0954   ALT 11 05/19/2013 1856   BILITOT 0.21 07/15/2013 0954   BILITOT 0.7 05/19/2013 1856       RADIOGRAPHIC STUDIES: No results found.    ASSESSMENT: GERAL TUCH is a  67 y.o. female with:  #1 Screening mammogram in 03/2013 noted a mass at the 12 o'clock position.  Diagnostic mammogram and ultrasound of the left breast was performed on 03/30/2013.  The ultrasound showed a 1.8 cm mass in the 12 o'clock position of the left breast. Bilateral breast MRI  performed on 04/25/2013 showed a 2.4 cm area of concern in the 12 o'clock position of the left breast. There were no abnormal appearing axillary subpectoral or internal mammary lymph nodes.  Left breast needle core biopsy performed on 04/15/2013 revealed invasive carcinoma with papillary features consistent with a ductal phenotype. Tumor grade was intermediate to high grade, estrogen receptor negative, progesterone receptor negative, with a proliferation marker Ki-67 of 47%, HER-2/neu by CISH showed no amplification.  #2  2-D echocardiogram  performed on 05/06/2013 showed a left ventricular ejection fraction of 60%.  #3 Neoadjuvant chemotherapy consisting of AC (Adriamycin/Cytoxan) began on 05/13/2013 through 06/24/2013 x 4 cycles with Neulasta injection for granulocyte support administered on day 2 of each cycle.    #4  Neoadjuvant chemotherapy consisting of Taxol/Carboplatin x 12 weekly cycles started on 07/08/2013.   PLAN:  #1 Ellen Beltran will proceed with neoadjuvant chemotherapy cycle #2 consisting of Taxol/Carboplatin today.  Risk and benefits of chemotherapy have previously been explained to her and her husband.  An electronic prescription for dexamethasone 4 mg take 2 tablets by mouth 2 times daily x 3 days after chemotherapy #60 tablets with one refill was sent to the patient's pharmacy.    #2 We plan to see Ellen Beltran again on 07/22/2013 for neoadjuvant chemotherapy cycle #3 of 12 weekly cycles consisting of Taxol/Carboplatin.  All questions answered.  Mr. and Ellen Beltran were encouraged to contact us in the interim with any questions, concerns, or problems.   Larina Bras, NP-C 07/17/2013  7:12 PM

## 2013-07-15 NOTE — Patient Instructions (Signed)
[facility] Discharge Instructions for Patients Receiving Chemotherapy  Today you received the following chemotherapy agents taxol and carboplatin.  To help prevent nausea and vomiting after your treatment, we encourage you to take your nausea medication as directed.  If you develop nausea and vomiting that is not controlled by your nausea medication, call the clinic.   BELOW ARE SYMPTOMS THAT SHOULD BE REPORTED IMMEDIATELY:  *FEVER GREATER THAN 100.5 F  *CHILLS WITH OR WITHOUT FEVER  NAUSEA AND VOMITING THAT IS NOT CONTROLLED WITH YOUR NAUSEA MEDICATION  *UNUSUAL SHORTNESS OF BREATH  *UNUSUAL BRUISING OR BLEEDING  TENDERNESS IN MOUTH AND THROAT WITH OR WITHOUT PRESENCE OF ULCERS  *URINARY PROBLEMS  *BOWEL PROBLEMS  UNUSUAL RASH Items with * indicate a potential emergency and should be followed up as soon as possible.  Feel free to call the clinic you have any questions or concerns. The clinic phone number is [phone].  

## 2013-07-15 NOTE — Patient Instructions (Signed)
Please contact us at [phone] if you have any questions or concerns.  Please continue to do well and enjoy life!!!  Get plenty of rest, drink plenty of water, exercise daily (walking), eat a balanced diet.  Results for orders placed in visit on 07/15/13 (from the past 24 hour(s))  CBC WITH DIFFERENTIAL     Status: Abnormal   Collection Time    07/15/13  9:53 AM      Result Value Range   WBC 8.0  3.9 - 10.3 10e3/uL   NEUT# 6.1  1.5 - 6.5 10e3/uL   HGB 11.8  11.6 - 15.9 g/dL   HCT 16.1  09.6 - 04.5 %   Platelets 433 (*) 145 - 400 10e3/uL   MCV 89.4  79.5 - 101.0 fL   MCH 29.7  25.1 - 34.0 pg   MCHC 33.2  31.5 - 36.0 g/dL   RBC 4.09  8.11 - 9.14 10e6/uL   RDW 15.9 (*) 11.2 - 14.5 %   lymph# 1.0  0.9 - 3.3 10e3/uL   MONO# 0.9  0.1 - 0.9 10e3/uL   Eosinophils Absolute 0.0  0.0 - 0.5 10e3/uL   Basophils Absolute 0.1  0.0 - 0.1 10e3/uL   NEUT% 76.0  38.4 - 76.8 %   LYMPH% 12.0 (*) 14.0 - 49.7 %   MONO% 10.8  0.0 - 14.0 %   EOS% 0.1  0.0 - 7.0 %   BASO% 1.1  0.0 - 2.0 %   Narrative:    Performed At:  [facility]               [address]               [place], [place] [address]  COMPREHENSIVE METABOLIC PANEL (CC13)     Status: Abnormal   Collection Time    07/15/13  9:54 AM      Result Value Range   Sodium 141  136 - 145 mEq/L   Potassium 4.6  3.5 - 5.1 mEq/L   Chloride 105  98 - 109 mEq/L   CO2 25  22 - 29 mEq/L   Glucose 117  70 - 140 mg/dl   BUN 62.1  7.0 - 30.8 mg/dL   Creatinine 0.7  0.6 - 1.1 mg/dL   Total Bilirubin 6.57  0.20 - 1.20 mg/dL   Alkaline Phosphatase 76  40 - 150 U/L   AST 17  5 - 34 U/L   ALT 26  0 - 55 U/L   Total Protein 6.4  6.4 - 8.3 g/dL   Albumin 3.4 (*) 3.5 - 5.0 g/dL   Calcium 9.7  8.4 - 84.6 mg/dL   Anion Gap 10  3 - 11 mEq/L   Narrative:    Performed At:  [facility]               [address]               [place], [place] [address]

## 2013-07-21 ENCOUNTER — Other Ambulatory Visit: Payer: Self-pay

## 2013-07-22 ENCOUNTER — Encounter: Payer: Self-pay | Admitting: Family

## 2013-07-22 ENCOUNTER — Ambulatory Visit (HOSPITAL_BASED_OUTPATIENT_CLINIC_OR_DEPARTMENT_OTHER): Payer: Medicare Other

## 2013-07-22 ENCOUNTER — Other Ambulatory Visit (HOSPITAL_BASED_OUTPATIENT_CLINIC_OR_DEPARTMENT_OTHER): Payer: Medicare Other | Admitting: Lab

## 2013-07-22 ENCOUNTER — Ambulatory Visit (HOSPITAL_BASED_OUTPATIENT_CLINIC_OR_DEPARTMENT_OTHER): Payer: Medicare Other | Admitting: Family

## 2013-07-22 VITALS — BP 133/71 | HR 98 | Temp 98.4°F | Resp 18 | Ht <= 58 in | Wt 106.7 lb

## 2013-07-22 DIAGNOSIS — C50912 Malignant neoplasm of unspecified site of left female breast: Secondary | ICD-10-CM

## 2013-07-22 DIAGNOSIS — C50119 Malignant neoplasm of central portion of unspecified female breast: Secondary | ICD-10-CM

## 2013-07-22 DIAGNOSIS — C50112 Malignant neoplasm of central portion of left female breast: Secondary | ICD-10-CM

## 2013-07-22 DIAGNOSIS — C50919 Malignant neoplasm of unspecified site of unspecified female breast: Secondary | ICD-10-CM

## 2013-07-22 DIAGNOSIS — Z5112 Encounter for antineoplastic immunotherapy: Secondary | ICD-10-CM

## 2013-07-22 DIAGNOSIS — Z171 Estrogen receptor negative status [ER-]: Secondary | ICD-10-CM

## 2013-07-22 LAB — COMPREHENSIVE METABOLIC PANEL (CC13)
ALT: 22 U/L (ref 0–55)
AST: 32 U/L (ref 5–34)
Albumin: 3.1 g/dL — ABNORMAL LOW (ref 3.5–5.0)
Alkaline Phosphatase: 68 U/L (ref 40–150)
Creatinine: 0.7 mg/dL (ref 0.6–1.1)
Potassium: 3.7 mEq/L (ref 3.5–5.1)
Sodium: 140 mEq/L (ref 136–145)
Total Bilirubin: 0.3 mg/dL (ref 0.20–1.20)
Total Protein: 5.8 g/dL — ABNORMAL LOW (ref 6.4–8.3)

## 2013-07-22 LAB — CBC WITH DIFFERENTIAL/PLATELET
BASO%: 0.6 % (ref 0.0–2.0)
EOS%: 0.6 % (ref 0.0–7.0)
Eosinophils Absolute: 0 10*3/uL (ref 0.0–0.5)
HGB: 11.4 g/dL — ABNORMAL LOW (ref 11.6–15.9)
LYMPH%: 9.5 % — ABNORMAL LOW (ref 14.0–49.7)
MCHC: 33.5 g/dL (ref 31.5–36.0)
MCV: 90.4 fL (ref 79.5–101.0)
MONO%: 13.6 % (ref 0.0–14.0)
NEUT#: 6.5 10*3/uL (ref 1.5–6.5)
Platelets: 237 10*3/uL (ref 145–400)
RBC: 3.76 10*6/uL (ref 3.70–5.45)
RDW: 16.1 % — ABNORMAL HIGH (ref 11.2–14.5)

## 2013-07-22 MED ORDER — DEXAMETHASONE SODIUM PHOSPHATE 20 MG/5ML IJ SOLN
INTRAMUSCULAR | Status: AC
  Filled 2013-07-22: qty 5

## 2013-07-22 MED ORDER — DEXAMETHASONE SODIUM PHOSPHATE 20 MG/5ML IJ SOLN
20.0000 mg | Freq: Once | INTRAMUSCULAR | Status: AC
  Administered 2013-07-22: 20 mg via INTRAVENOUS

## 2013-07-22 MED ORDER — FAMOTIDINE IN NACL 20-0.9 MG/50ML-% IV SOLN
INTRAVENOUS | Status: AC
  Filled 2013-07-22: qty 50

## 2013-07-22 MED ORDER — SODIUM CHLORIDE 0.9 % IV SOLN
Freq: Once | INTRAVENOUS | Status: AC
  Administered 2013-07-22: 12:00:00 via INTRAVENOUS

## 2013-07-22 MED ORDER — HEPARIN SOD (PORK) LOCK FLUSH 100 UNIT/ML IV SOLN
500.0000 [IU] | Freq: Once | INTRAVENOUS | Status: AC | PRN
  Administered 2013-07-22: 500 [IU]
  Filled 2013-07-22: qty 5

## 2013-07-22 MED ORDER — ONDANSETRON 16 MG/50ML IVPB (CHCC)
16.0000 mg | Freq: Once | INTRAVENOUS | Status: AC
  Administered 2013-07-22: 16 mg via INTRAVENOUS

## 2013-07-22 MED ORDER — ONDANSETRON 16 MG/50ML IVPB (CHCC)
INTRAVENOUS | Status: AC
  Filled 2013-07-22: qty 16

## 2013-07-22 MED ORDER — DIPHENHYDRAMINE HCL 50 MG/ML IJ SOLN
50.0000 mg | Freq: Once | INTRAMUSCULAR | Status: AC
  Administered 2013-07-22: 50 mg via INTRAVENOUS

## 2013-07-22 MED ORDER — FAMOTIDINE IN NACL 20-0.9 MG/50ML-% IV SOLN
20.0000 mg | Freq: Once | INTRAVENOUS | Status: AC
  Administered 2013-07-22: 20 mg via INTRAVENOUS

## 2013-07-22 MED ORDER — SODIUM CHLORIDE 0.9 % IV SOLN
132.0000 mg | Freq: Once | INTRAVENOUS | Status: AC
  Administered 2013-07-22: 130 mg via INTRAVENOUS
  Filled 2013-07-22: qty 13

## 2013-07-22 MED ORDER — PACLITAXEL CHEMO INJECTION 300 MG/50ML
80.0000 mg/m2 | Freq: Once | INTRAVENOUS | Status: AC
  Administered 2013-07-22: 108 mg via INTRAVENOUS
  Filled 2013-07-22: qty 18

## 2013-07-22 MED ORDER — SODIUM CHLORIDE 0.9 % IJ SOLN
10.0000 mL | INTRAMUSCULAR | Status: DC | PRN
  Administered 2013-07-22: 10 mL
  Filled 2013-07-22: qty 10

## 2013-07-22 MED ORDER — DIPHENHYDRAMINE HCL 50 MG/ML IJ SOLN
INTRAMUSCULAR | Status: AC
Start: 2013-07-22 — End: 2013-07-22
  Filled 2013-07-22: qty 1

## 2013-07-22 NOTE — Progress Notes (Signed)
[facility]  Telephone:([phone]) [phone] Fax:(336) [phone]  OFFICE PROGRESS NOTE   ID: Ellen Beltran   DOB: 11/08/1945  MR#: [id number]  JYN#:[id number]   PCP: [name], MD   DIAGNOSIS: Ellen Beltran is a 67 y.o. female diagnosed in 04/2013 with a clinical stage IIA, estrogen receptor negative, progesterone receptor negative, HER-2/neu neg, invasive ductal carcinoma of the left breast.   PRIOR THERAPY: 1. Patient went for annual screening mammogram in 03/2013 and mammography noted a mass at the 12 o'clock position. Diagnostic mammogram and ultrasound of the left breast was performed on 03/30/2013.  The ultrasound showed a 1.8 cm mass in the 12 o'clock position of the left breast.  Bilateral breast MRI performed on 04/25/2013 showed a 2.4 cm area of concern in the 12 o'clock position of the left breast. There were no abnormal appearing axillary subpectoral or internal mammary lymph nodes.  Left breast needle core biopsy performed on 04/15/2013 revealed invasive carcinoma with papillary features consistent with a ductal phenotype. Tumor grade was intermediate to high grade, estrogen receptor negative, progesterone receptor negative, with a proliferation marker Ki-67 of 47%, HER-2/neu by CISH showed no amplification.  2.  2-D echocardiogram performed on 05/06/2013 showed a left ventricular ejection fraction of 60%.  3. Neoadjuvant chemotherapy consisting of AC (Adriamycin/Cytoxan) began on 05/13/2013 through 06/24/2013 x 4 cycles with Neulasta injection for granulocyte support administered on day 2 of each cycle.    4.  Neoadjuvant chemotherapy consisting of Taxol/Carboplatin x 12 weekly cycles scheduled that started on 07/08/2013.   CURRENT THERAPY: Neoadjuvant chemotherapy with weekly Taxol/Carboplatin.   INTERVAL HISTORY: Ellen Beltran 67 y.o. female returns for physical examination and laboratory assessment prior to proceeding with neoadjuvant chemotherapy  cycle #3 consisting of Taxol/Carboplatin today.  She is accompanied for today's office visit by her husband Ellen Beltran.  Since her last office visit on 07/15/2013 her interval history is significant for continued fatigue after receiving chemotherapy and an increased appetite.    We again reviewed that increased exercise ( daily walking as tolerated) was encouraged for chemotherapy induced fatigue.  I explained her appetite was secondary to steroids.  Otherwise, she continues to tolerate the chemotherapy infusions well.  Her platelet level returned to normal today.  Her interval history is otherwise unremarkable.   MEDICAL HISTORY: Past Medical History  Diagnosis Date  . Hypertension   . Hyperlipidemia   . Osteopenia   . Arthritis   . Hot flashes   . Cancer     left breast    ALLERGIES:   Allergies  Allergen Reactions  . Codeine Other (See Comments)    Sees unusual things  . Morphine And Related Nausea Only  . Sulfa Antibiotics Rash    MEDICATIONS:  Current Outpatient Prescriptions  Medication Sig Dispense Refill  . dexamethasone (DECADRON) 4 MG tablet Take 2 tablets (8 mg total) by mouth 2 (two) times daily. BID x 3 days after chemo  60 tablet  1  . lidocaine-prilocaine (EMLA) cream Apply topically as needed.  30 g  6  . LORazepam (ATIVAN) 0.5 MG tablet Take 1 tablet (0.5 mg total) by mouth every 6 (six) hours as needed (Nausea or vomiting).  30 tablet  0  . ondansetron (ZOFRAN) 8 MG tablet Take 8 mg by mouth every 8 (eight) hours as needed (Take 72 hours after chemotherapy).       Marland Kitchen PRESCRIPTION MEDICATION Chemotherapy at [facility].      Marland Kitchen prochlorperazine (COMPAZINE) 10 MG tablet Take  10 mg by mouth every 8 (eight) hours as needed.       . simvastatin (ZOCOR) 40 MG tablet Take 40 mg by mouth every evening.      . ciprofloxacin (CIPRO) 500 MG tablet Take 1 tablet (500 mg total) by mouth 2 (two) times daily.  14 tablet  0   No current facility-administered medications for this visit.     SURGICAL HISTORY:  Past Surgical History  Procedure Laterality Date  . Cardiac catheterization  2004  . Tubal ligation  age 72  . Total abdominal hysterectomy  age 33 or 89  . Breast biopsy Left aug 2014  . Portacath placement N/A 05/02/2013    Procedure: INSERTION PORT-A-CATH;  Surgeon: Kandis Cocking, MD;  Location: [facility];  Service: General;  Laterality: N/A;    REVIEW OF SYSTEMS:  A 10 point review of systems was completed and is negative except as noted above.  Ellen Beltran denies any other symptomatology including fatigue, fever or chills, headache, vision changes, swollen glands, cough or shortness of breath, chest pain or discomfort, nausea, vomiting, diarrhea, constipation, change in urinary or bowel habits, arthralgias/myalgias, unusual bleeding/bruising or any other symptomatology.   PHYSICAL EXAMINATION: Blood pressure 133/71, pulse 98, temperature 98.4 F (36.9 C), temperature source Oral, resp. rate 18, height 4\' 9"  (1.448 m), weight 106 lb 11.2 oz (48.399 kg). Body mass index is 23.08 kg/(m^2).  ECOG PERFORMANCE STATUS: 1 - Symptomatic but completely ambulatory  General appearance: Alert, cooperative, well nourished, thin frame, no apparent distress Head: Normocephalic, without obvious abnormality, atraumatic Eyes: Mild arcus senilis, PERRLA, EOMI Nose: Nares, septum and mucosa are normal, no drainage or sinus tenderness Neck: No adenopathy, supple, symmetrical, trachea midline, no tenderness Resp: Clear to auscultation bilaterally, no wheezes/rales/rhonchi Cardio: Regular rate and rhythm, S1, S2 normal, no murmur, click, rub or gallop, no edema, right chest Port-A-Cath covered in EMLA cream with occlusive dressing Breasts:  Deferred GI: Soft, not distended, non-tender, hypoactive bowel sounds, no organomegaly Skin: No rashes/lesions, skin warm and dry, no erythematous areas, no cyanosis  M/S:  Atraumatic, normal strength in all extremities, normal range of motion,  no clubbing  Lymph nodes: Cervical, supraclavicular, and axillary nodes normal Neurologic: Grossly normal, cranial nerves II through XII intact, alert and oriented x 3 Psych: Appropriate affect   LABORATORY DATA: Lab Results  Component Value Date   WBC 8.5 07/22/2013   HGB 11.4* 07/22/2013   HCT 34.0* 07/22/2013   MCV 90.4 07/22/2013   PLT 237 07/22/2013      Chemistry      Component Value Date/Time   NA 140 07/22/2013 0954   NA 137 05/22/2013 0246   K 3.7 07/22/2013 0954   K 4.2 05/22/2013 0246   CL 108 05/22/2013 0246   CO2 25 07/22/2013 0954   CO2 23 05/22/2013 0246   BUN 13.1 07/22/2013 0954   BUN 13 05/22/2013 0246   CREATININE 0.7 07/22/2013 0954   CREATININE 0.62 05/22/2013 0246      Component Value Date/Time   CALCIUM 9.4 07/22/2013 0954   CALCIUM 8.6 05/22/2013 0246   ALKPHOS 68 07/22/2013 0954   ALKPHOS 75 05/19/2013 1856   AST 32 07/22/2013 0954   AST 8 05/19/2013 1856   ALT 22 07/22/2013 0954   ALT 11 05/19/2013 1856   BILITOT 0.30 07/22/2013 0954   BILITOT 0.7 05/19/2013 1856       RADIOGRAPHIC STUDIES: No results found.    ASSESSMENT: Ellen Beltran is a 67  y.o. female with:  #1 Screening mammogram in 03/2013 noted a mass at the 12 o'clock position.  Diagnostic mammogram and ultrasound of the left breast was performed on 03/30/2013.  The ultrasound showed a 1.8 cm mass in the 12 o'clock position of the left breast. Bilateral breast MRI  performed on 04/25/2013 showed a 2.4 cm area of concern in the 12 o'clock position of the left breast. There were no abnormal appearing axillary subpectoral or internal mammary lymph nodes.  Left breast needle core biopsy performed on 04/15/2013 revealed invasive carcinoma with papillary features consistent with a ductal phenotype. Tumor grade was intermediate to high grade, estrogen receptor negative, progesterone receptor negative, with a proliferation marker Ki-67 of 47%, HER-2/neu by CISH showed no amplification.  #2  2-D echocardiogram performed  on 05/06/2013 showed a left ventricular ejection fraction of 60%.  #3 Neoadjuvant chemotherapy consisting of AC (Adriamycin/Cytoxan) began on 05/13/2013 through 06/24/2013 x 4 cycles with Neulasta injection for granulocyte support administered on day 2 of each cycle.    #4  Neoadjuvant chemotherapy consisting of Taxol/Carboplatin x 12 weekly cycles started on 07/08/2013.   PLAN:  #1 Ellen Beltran will proceed with neoadjuvant chemotherapy cycle #3 consisting of Taxol/Carboplatin today. Risk and benefits of chemotherapy have previously been explained to her and her husband.    #2 We plan to see Ellen Beltran again on 07/29/2013 for neoadjuvant chemotherapy cycle #4 of 12 weekly cycles consisting of Taxol/Carboplatin.  [birth year] will also be Ellen Beltran  67th birthday.  All questions answered. Ellen Beltran were encouraged to contact us in the interim with any questions, concerns, or problems  Larina Bras, NP-C 07/24/2013    6:17 PM

## 2013-07-22 NOTE — Patient Instructions (Signed)
Please contact us at [phone] if you have any questions or concerns.  Please continue to do well and enjoy life!!!  Get plenty of rest, drink plenty of water, exercise daily (walking), eat a balanced diet.  Results for orders placed in visit on 07/22/13 (from the past 24 hour(s))  CBC WITH DIFFERENTIAL     Status: Abnormal   Collection Time    07/22/13  9:53 AM      Result Value Range   WBC 8.5  3.9 - 10.3 10e3/uL   NEUT# 6.5  1.5 - 6.5 10e3/uL   HGB 11.4 (*) 11.6 - 15.9 g/dL   HCT 69.6 (*) 29.5 - 28.4 %   Platelets 237  145 - 400 10e3/uL   MCV 90.4  79.5 - 101.0 fL   MCH 30.3  25.1 - 34.0 pg   MCHC 33.5  31.5 - 36.0 g/dL   RBC 1.32  4.40 - 1.02 10e6/uL   RDW 16.1 (*) 11.2 - 14.5 %   lymph# 0.8 (*) 0.9 - 3.3 10e3/uL   MONO# 1.2 (*) 0.1 - 0.9 10e3/uL   Eosinophils Absolute 0.0  0.0 - 0.5 10e3/uL   Basophils Absolute 0.0  0.0 - 0.1 10e3/uL   NEUT% 75.7  38.4 - 76.8 %   LYMPH% 9.5 (*) 14.0 - 49.7 %   MONO% 13.6  0.0 - 14.0 %   EOS% 0.6  0.0 - 7.0 %   BASO% 0.6  0.0 - 2.0 %   Narrative:    Performed At:  [facility]               [address]               [place], [place] [address]  COMPREHENSIVE METABOLIC PANEL (CC13)     Status: Abnormal   Collection Time    07/22/13  9:54 AM      Result Value Range   Sodium 140  136 - 145 mEq/L   Potassium 3.7  3.5 - 5.1 mEq/L   Chloride 104  98 - 109 mEq/L   CO2 25  22 - 29 mEq/L   Glucose 104  70 - 140 mg/dl   BUN 64.4  7.0 - 03.4 mg/dL   Creatinine 0.7  0.6 - 1.1 mg/dL   Total Bilirubin 7.42  0.20 - 1.20 mg/dL   Alkaline Phosphatase 68  40 - 150 U/L   AST 32  5 - 34 U/L   ALT 22  0 - 55 U/L   Total Protein 5.8 (*) 6.4 - 8.3 g/dL   Albumin 3.1 (*) 3.5 - 5.0 g/dL   Calcium 9.4  8.4 - 59.5 mg/dL   Anion Gap 11  3 - 11 mEq/L   Narrative:    Performed At:  [facility]               [address]               [place], [place] [address]

## 2013-07-29 ENCOUNTER — Other Ambulatory Visit (HOSPITAL_BASED_OUTPATIENT_CLINIC_OR_DEPARTMENT_OTHER): Payer: Medicare Other | Admitting: Lab

## 2013-07-29 ENCOUNTER — Ambulatory Visit (HOSPITAL_BASED_OUTPATIENT_CLINIC_OR_DEPARTMENT_OTHER): Payer: Medicare Other | Admitting: Adult Health

## 2013-07-29 ENCOUNTER — Encounter: Payer: Self-pay | Admitting: Adult Health

## 2013-07-29 ENCOUNTER — Ambulatory Visit (HOSPITAL_BASED_OUTPATIENT_CLINIC_OR_DEPARTMENT_OTHER): Payer: Medicare Other

## 2013-07-29 VITALS — BP 160/78 | HR 70 | Temp 97.6°F | Resp 20 | Ht <= 58 in | Wt 107.1 lb

## 2013-07-29 DIAGNOSIS — Z171 Estrogen receptor negative status [ER-]: Secondary | ICD-10-CM

## 2013-07-29 DIAGNOSIS — Z23 Encounter for immunization: Secondary | ICD-10-CM

## 2013-07-29 DIAGNOSIS — C50919 Malignant neoplasm of unspecified site of unspecified female breast: Secondary | ICD-10-CM

## 2013-07-29 DIAGNOSIS — C50119 Malignant neoplasm of central portion of unspecified female breast: Secondary | ICD-10-CM

## 2013-07-29 DIAGNOSIS — C50912 Malignant neoplasm of unspecified site of left female breast: Secondary | ICD-10-CM

## 2013-07-29 DIAGNOSIS — C50112 Malignant neoplasm of central portion of left female breast: Secondary | ICD-10-CM

## 2013-07-29 DIAGNOSIS — Z5111 Encounter for antineoplastic chemotherapy: Secondary | ICD-10-CM

## 2013-07-29 LAB — CBC WITH DIFFERENTIAL/PLATELET
BASO%: 0.1 % (ref 0.0–2.0)
Basophils Absolute: 0 10*3/uL (ref 0.0–0.1)
Eosinophils Absolute: 0.1 10*3/uL (ref 0.0–0.5)
HCT: 33 % — ABNORMAL LOW (ref 34.8–46.6)
HGB: 10.8 g/dL — ABNORMAL LOW (ref 11.6–15.9)
LYMPH%: 8.7 % — ABNORMAL LOW (ref 14.0–49.7)
MCHC: 32.7 g/dL (ref 31.5–36.0)
MCV: 91.4 fL (ref 79.5–101.0)
MONO%: 12.4 % (ref 0.0–14.0)
NEUT#: 8.5 10*3/uL — ABNORMAL HIGH (ref 1.5–6.5)
Platelets: 175 10*3/uL (ref 145–400)
RBC: 3.61 10*6/uL — ABNORMAL LOW (ref 3.70–5.45)
RDW: 17.2 % — ABNORMAL HIGH (ref 11.2–14.5)
WBC: 10.8 10*3/uL — ABNORMAL HIGH (ref 3.9–10.3)
lymph#: 0.9 10*3/uL (ref 0.9–3.3)

## 2013-07-29 LAB — COMPREHENSIVE METABOLIC PANEL (CC13)
ALT: 36 U/L (ref 0–55)
AST: 19 U/L (ref 5–34)
Albumin: 3.2 g/dL — ABNORMAL LOW (ref 3.5–5.0)
Alkaline Phosphatase: 66 U/L (ref 40–150)
BUN: 14.1 mg/dL (ref 7.0–26.0)
Potassium: 4 mEq/L (ref 3.5–5.1)
Sodium: 139 mEq/L (ref 136–145)
Total Bilirubin: 0.24 mg/dL (ref 0.20–1.20)
Total Protein: 5.8 g/dL — ABNORMAL LOW (ref 6.4–8.3)

## 2013-07-29 MED ORDER — DIPHENHYDRAMINE HCL 50 MG/ML IJ SOLN
INTRAMUSCULAR | Status: AC
  Filled 2013-07-29: qty 1

## 2013-07-29 MED ORDER — FAMOTIDINE IN NACL 20-0.9 MG/50ML-% IV SOLN
20.0000 mg | Freq: Once | INTRAVENOUS | Status: AC
  Administered 2013-07-29: 20 mg via INTRAVENOUS

## 2013-07-29 MED ORDER — SODIUM CHLORIDE 0.9 % IV SOLN
132.0000 mg | Freq: Once | INTRAVENOUS | Status: AC
  Administered 2013-07-29: 130 mg via INTRAVENOUS
  Filled 2013-07-29: qty 13

## 2013-07-29 MED ORDER — HEPARIN SOD (PORK) LOCK FLUSH 100 UNIT/ML IV SOLN
500.0000 [IU] | Freq: Once | INTRAVENOUS | Status: AC | PRN
  Administered 2013-07-29: 500 [IU]
  Filled 2013-07-29: qty 5

## 2013-07-29 MED ORDER — PACLITAXEL CHEMO INJECTION 300 MG/50ML
80.0000 mg/m2 | Freq: Once | INTRAVENOUS | Status: AC
  Administered 2013-07-29: 108 mg via INTRAVENOUS
  Filled 2013-07-29: qty 18

## 2013-07-29 MED ORDER — DEXAMETHASONE SODIUM PHOSPHATE 20 MG/5ML IJ SOLN
20.0000 mg | Freq: Once | INTRAMUSCULAR | Status: AC
  Administered 2013-07-29: 20 mg via INTRAVENOUS

## 2013-07-29 MED ORDER — SODIUM CHLORIDE 0.9 % IJ SOLN
10.0000 mL | INTRAMUSCULAR | Status: DC | PRN
  Administered 2013-07-29: 10 mL
  Filled 2013-07-29: qty 10

## 2013-07-29 MED ORDER — INFLUENZA VAC SPLIT QUAD 0.5 ML IM SUSP
0.5000 mL | Freq: Once | INTRAMUSCULAR | Status: AC
  Administered 2013-07-29: 0.5 mL via INTRAMUSCULAR
  Filled 2013-07-29: qty 0.5

## 2013-07-29 MED ORDER — ONDANSETRON 16 MG/50ML IVPB (CHCC)
16.0000 mg | Freq: Once | INTRAVENOUS | Status: AC
  Administered 2013-07-29: 16 mg via INTRAVENOUS

## 2013-07-29 MED ORDER — INFLUENZA VAC SPLIT QUAD 0.5 ML IM SUSP: 0.5000 mL | INTRAMUSCULAR | Status: DC

## 2013-07-29 MED ORDER — FAMOTIDINE IN NACL 20-0.9 MG/50ML-% IV SOLN
INTRAVENOUS | Status: AC
  Filled 2013-07-29: qty 50

## 2013-07-29 MED ORDER — DEXAMETHASONE SODIUM PHOSPHATE 20 MG/5ML IJ SOLN
INTRAMUSCULAR | Status: AC
  Filled 2013-07-29: qty 5

## 2013-07-29 MED ORDER — ONDANSETRON 16 MG/50ML IVPB (CHCC)
INTRAVENOUS | Status: AC
  Filled 2013-07-29: qty 16

## 2013-07-29 MED ORDER — SODIUM CHLORIDE 0.9 % IV SOLN
Freq: Once | INTRAVENOUS | Status: AC
  Administered 2013-07-29: 13:00:00 via INTRAVENOUS

## 2013-07-29 MED ORDER — DIPHENHYDRAMINE HCL 50 MG/ML IJ SOLN
50.0000 mg | Freq: Once | INTRAMUSCULAR | Status: AC
  Administered 2013-07-29: 50 mg via INTRAVENOUS

## 2013-07-29 NOTE — Patient Instructions (Signed)
[facility] Discharge Instructions for Patients Receiving Chemotherapy  Today you received the following chemotherapy agents: Taxol and Carboplatin.  To help prevent nausea and vomiting after your treatment, we encourage you to take your nausea medication as prescribed.   If you develop nausea and vomiting that is not controlled by your nausea medication, call the clinic.   BELOW ARE SYMPTOMS THAT SHOULD BE REPORTED IMMEDIATELY:  *FEVER GREATER THAN 100.5 F  *CHILLS WITH OR WITHOUT FEVER  NAUSEA AND VOMITING THAT IS NOT CONTROLLED WITH YOUR NAUSEA MEDICATION  *UNUSUAL SHORTNESS OF BREATH  *UNUSUAL BRUISING OR BLEEDING  TENDERNESS IN MOUTH AND THROAT WITH OR WITHOUT PRESENCE OF ULCERS  *URINARY PROBLEMS  *BOWEL PROBLEMS  UNUSUAL RASH Items with * indicate a potential emergency and should be followed up as soon as possible.  Feel free to call the clinic you have any questions or concerns. The clinic phone number is [phone].    

## 2013-07-29 NOTE — Progress Notes (Addendum)
[facility]  Telephone:(336) [phone] Fax:(336) [phone]  OFFICE PROGRESS NOTE   ID: Ellen Beltran   DOB: Jul 08, 1946  MR#: [id number]  JYN#:[id number]   PCP: [name], MD   DIAGNOSIS: Ellen Beltran is a 67 y.o. female diagnosed in 04/2013 with a clinical stage IIA, estrogen receptor negative, progesterone receptor negative, HER-2/neu neg, invasive ductal carcinoma of the left breast.   PRIOR THERAPY: 1. Patient went for annual screening mammogram in 03/2013 and mammography noted a mass at the 12 o'clock position. Diagnostic mammogram and ultrasound of the left breast was performed on 03/30/2013.  The ultrasound showed a 1.8 cm mass in the 12 o'clock position of the left breast.  Bilateral breast MRI performed on 04/25/2013 showed a 2.4 cm area of concern in the 12 o'clock position of the left breast. There were no abnormal appearing axillary subpectoral or internal mammary lymph nodes.  Left breast needle core biopsy performed on 04/15/2013 revealed invasive carcinoma with papillary features consistent with a ductal phenotype. Tumor grade was intermediate to high grade, estrogen receptor negative, progesterone receptor negative, with a proliferation marker Ki-67 of 47%, HER-2/neu by CISH showed no amplification.  2.  2-D echocardiogram performed on 05/06/2013 showed a left ventricular ejection fraction of 60%.  3. Neoadjuvant chemotherapy consisting of AC (Adriamycin/Cytoxan) began on 05/13/2013 through 06/24/2013 x 4 cycles with Neulasta injection for granulocyte support administered on day 2 of each cycle.    4.  Neoadjuvant chemotherapy consisting of Taxol/Carboplatin x 12 weekly cycles scheduled that started on 07/08/2013.   CURRENT THERAPY: Taxol/Carbo week 4  INTERVAL HISTORY: Ellen Beltran 67 y.o. female returns for follow up and evaluation prior to chemotherapy.  She is doing well today and denies fevers, chills, nausea, vomting, constipation, diarrhea,  numbness, or any further concerns.  A 10 point ROS is neg.  She would like a flu vaccine today.     MEDICAL HISTORY: Past Medical History  Diagnosis Date  . Hypertension   . Hyperlipidemia   . Osteopenia   . Arthritis   . Hot flashes   . Cancer     left breast    ALLERGIES:   Allergies  Allergen Reactions  . Codeine Other (See Comments)    Sees unusual things  . Morphine And Related Nausea Only  . Sulfa Antibiotics Rash    MEDICATIONS:  Current Outpatient Prescriptions  Medication Sig Dispense Refill  . dexamethasone (DECADRON) 4 MG tablet Take 2 tablets (8 mg total) by mouth 2 (two) times daily. BID x 3 days after chemo  60 tablet  1  . lidocaine-prilocaine (EMLA) cream Apply topically as needed.  30 g  6  . LORazepam (ATIVAN) 0.5 MG tablet Take 1 tablet (0.5 mg total) by mouth every 6 (six) hours as needed (Nausea or vomiting).  30 tablet  0  . ondansetron (ZOFRAN) 8 MG tablet Take 8 mg by mouth every 8 (eight) hours as needed (Take 72 hours after chemotherapy).       Marland Kitchen PRESCRIPTION MEDICATION Chemotherapy at [facility].      Marland Kitchen prochlorperazine (COMPAZINE) 10 MG tablet Take 10 mg by mouth every 8 (eight) hours as needed.       . simvastatin (ZOCOR) 40 MG tablet Take 40 mg by mouth every evening.      . ciprofloxacin (CIPRO) 500 MG tablet Take 1 tablet (500 mg total) by mouth 2 (two) times daily.  14 tablet  0   No current facility-administered medications for this visit.  SURGICAL HISTORY:  Past Surgical History  Procedure Laterality Date  . Cardiac catheterization  2004  . Tubal ligation  age 28  . Total abdominal hysterectomy  age 67 or 12  . Breast biopsy Left aug 2014  . Portacath placement N/A 05/02/2013    Procedure: INSERTION PORT-A-CATH;  Surgeon: Kandis Cocking, MD;  Location: [facility];  Service: General;  Laterality: N/A;    REVIEW OF SYSTEMS:  A 10 point review of systems was completed and is negative except as noted above.    PHYSICAL EXAMINATION: Blood  pressure 160/78, pulse 70, temperature 97.6 F (36.4 C), temperature source Oral, resp. rate 20, height 4\' 9"  (1.448 m), weight 107 lb 1.6 oz (48.58 kg). Body mass index is 23.17 kg/(m^2). General: Patient is a well appearing female in no acute distress HEENT: PERRLA, sclerae anicteric no conjunctival pallor, MMM Neck: supple, no palpable adenopathy Lungs: clear to auscultation bilaterally, no wheezes, rhonchi, or rales Cardiovascular: regular rate rhythm, S1, S2, no murmurs, rubs or gallops Abdomen: Soft, non-tender, non-distended, normoactive bowel sounds, no HSM Extremities: warm and well perfused, no clubbing, cyanosis, or edema Skin: No rashes or lesions Neuro: Non-focal ECOG PERFORMANCE STATUS: 1 - Symptomatic but completely ambulatory     LABORATORY DATA: Lab Results  Component Value Date   WBC 10.8* 07/29/2013   HGB 10.8* 07/29/2013   HCT 33.0* 07/29/2013   MCV 91.4 07/29/2013   PLT 175 07/29/2013      Chemistry      Component Value Date/Time   NA 139 07/29/2013 1041   NA 137 05/22/2013 0246   K 4.0 07/29/2013 1041   K 4.2 05/22/2013 0246   CL 108 05/22/2013 0246   CO2 25 07/29/2013 1041   CO2 23 05/22/2013 0246   BUN 14.1 07/29/2013 1041   BUN 13 05/22/2013 0246   CREATININE 0.6 07/29/2013 1041   CREATININE 0.62 05/22/2013 0246      Component Value Date/Time   CALCIUM 9.2 07/29/2013 1041   CALCIUM 8.6 05/22/2013 0246   ALKPHOS 66 07/29/2013 1041   ALKPHOS 75 05/19/2013 1856   AST 19 07/29/2013 1041   AST 8 05/19/2013 1856   ALT 36 07/29/2013 1041   ALT 11 05/19/2013 1856   BILITOT 0.24 07/29/2013 1041   BILITOT 0.7 05/19/2013 1856       RADIOGRAPHIC STUDIES: No results found.    ASSESSMENT: Ellen Beltran is a 67 y.o. female with:  #1 Screening mammogram in 03/2013 noted a mass at the 12 o'clock position.  Diagnostic mammogram and ultrasound of the left breast was performed on 03/30/2013.  The ultrasound showed a 1.8 cm mass in the 12 o'clock position of the left  breast. Bilateral breast MRI  performed on 04/25/2013 showed a 2.4 cm area of concern in the 12 o'clock position of the left breast. There were no abnormal appearing axillary subpectoral or internal mammary lymph nodes.  Left breast needle core biopsy performed on 04/15/2013 revealed invasive carcinoma with papillary features consistent with a ductal phenotype. Tumor grade was intermediate to high grade, estrogen receptor negative, progesterone receptor negative, with a proliferation marker Ki-67 of 47%, HER-2/neu by CISH showed no amplification.  #2  2-D echocardiogram performed on 05/06/2013 showed a left ventricular ejection fraction of 60%.  #3 Neoadjuvant chemotherapy consisting of AC (Adriamycin/Cytoxan) began on 05/13/2013 through 06/24/2013 x 4 cycles with Neulasta injection for granulocyte support administered on day 2 of each cycle.    #4  Neoadjuvant chemotherapy consisting of Taxol/Carboplatin  x 12 weekly cycles started on 07/08/2013.   PLAN:  #1 Doing well.  Patient will proceed with chemotherapy today.  I reviewed her labs with her in detail.  We discussed her overall plan today as well.    #2 Patient was ordered a flu vaccine at her request.    #3 She will return next week for labs, evaluation and cycle 5 of chemotherapy treatment.    All questions answered. Mr. and Mrs. Becraft were encouraged to contact us in the interim with any questions, concerns, or problems.  I spent 25 minutes counseling the patient face to face.  The total time spent in the appointment was 30 minutes.  Illa Level, NP Medical Oncology [facility] [phone] 07/31/2013    9:23 AM  ATTENDING'S ATTESTATION:  I personally reviewed patient's chart, examined patient myself, formulated the treatment plan as followed.    67 year old female with stage IIa ER negative PR negative HER-2/neu negative (triple negative) invasive ductal carcinoma of the left breast diagnosed in July  2014. Patient has completed 4 cycles of neoadjuvant Adriamycin Cytoxan. She is now receiving neoadjuvant Taxol carboplatinum. She is here for week #4. Overall she seems to be tolerating this regimen very nicely.  Patient is requesting flu injection we'll go ahead and give her this.  She will come back in one week's time for week #5 of treatment.  Drue Second, MD Medical/Oncology [facility] [phone] (beeper) [phone] (Office)  08/18/2013, 7:19 PM

## 2013-08-03 ENCOUNTER — Encounter (INDEPENDENT_AMBULATORY_CARE_PROVIDER_SITE_OTHER): Payer: Self-pay | Admitting: Surgery

## 2013-08-03 ENCOUNTER — Ambulatory Visit (INDEPENDENT_AMBULATORY_CARE_PROVIDER_SITE_OTHER): Payer: Medicare Other | Admitting: Surgery

## 2013-08-03 VITALS — BP 118/72 | HR 72 | Temp 98.2°F | Resp 14 | Ht <= 58 in | Wt 107.2 lb

## 2013-08-03 DIAGNOSIS — C50119 Malignant neoplasm of central portion of unspecified female breast: Secondary | ICD-10-CM

## 2013-08-03 DIAGNOSIS — C50112 Malignant neoplasm of central portion of left female breast: Secondary | ICD-10-CM

## 2013-08-03 NOTE — Progress Notes (Signed)
Re:   Ellen Beltran DOB:   02/16/1946 MRN:   [id number]  [facility]  ASSESSMENT AND PLAN: 1.  Left breast cancer, T2, N0, 12 o'clock  IDC with papillary features, ER - 0%, PR - 0%, Her2Neu - neg, Ki67 - 47%  2.4 cm on MRI  Oncology - Welton Flakes and Michell Heinrich  The plan is to do neoadjuvant therapy to help shrink the primary tumor for better margin control.  Plan: 1), 2) Neoadjuvant chemotx (A/C x 4, followed by Taxol/Carboplatin x 12 weekly cycles)  3) lumpectomy and SLNBx, 4) Radiation tx  Doing well at this time.  She has had 8/20 treatments.  She'll see me back in 2 months.  This will be about the end of her treatment.  2.  Power port - 05/02/2013 - D. Shariah Assad  The power port has done well. 3. HTN 4.  Hypercholesterolemia  Chief Complaint  Patient presents with  . Routine Post Op    p/o PAC placement   REFERRING PHYSICIAN: [name], MD  HISTORY OF PRESENT ILLNESS: Ellen Beltran is a 67 y.o. (DOB: Sep 28, 1945)  white  female whose primary care physician is [name], MD and comes to the [facility] today for left breast cancer. Her husband is with her. She is doing much better with her treatment now.  She cannot feel a mass.  He port has worked well.  We talked about MRI towards the end of her treatment, I'll see at the end of her treatment, with the planned lumpectomy/SLNBx about 2 to 4 weeks after her last treatment.  Breast Cancer History: She went for a routine mammogram 03/30/2013 at [facility].  She had a suspicious area at 12 o'clock in the left breast.  Her last mammogram was 2012. A biopsy 04/15/2013 revealed (Accession: [id number]) triple negative invasive cancer with papillary features. An MRI on 04/25/2013 show a 2.4 x 2.4 x 1.3 cm mass at the 12 o'clock position of the left breast. She is on hormones, which she has stopped.  She has no family history of breast cancer.  She had a hysterectomy in 2002 for benign disease.  Past Medical History  Diagnosis Date   . Hypertension   . Hyperlipidemia   . Osteopenia   . Arthritis   . Hot flashes   . Cancer     left breast     Current Outpatient Prescriptions  Medication Sig Dispense Refill  . ciprofloxacin (CIPRO) 500 MG tablet Take 1 tablet (500 mg total) by mouth 2 (two) times daily.  14 tablet  0  . dexamethasone (DECADRON) 4 MG tablet Take 2 tablets (8 mg total) by mouth 2 (two) times daily. BID x 3 days after chemo  60 tablet  1  . lidocaine-prilocaine (EMLA) cream Apply topically as needed.  30 g  6  . LORazepam (ATIVAN) 0.5 MG tablet Take 1 tablet (0.5 mg total) by mouth every 6 (six) hours as needed (Nausea or vomiting).  30 tablet  0  . ondansetron (ZOFRAN) 8 MG tablet Take 8 mg by mouth every 8 (eight) hours as needed (Take 72 hours after chemotherapy).       Marland Kitchen PRESCRIPTION MEDICATION Chemotherapy at [facility].      Marland Kitchen prochlorperazine (COMPAZINE) 10 MG tablet Take 10 mg by mouth every 8 (eight) hours as needed.       . simvastatin (ZOCOR) 40 MG tablet Take 40 mg by mouth every evening.       No current facility-administered medications  for this visit.      Allergies  Allergen Reactions  . Codeine Other (See Comments)    Sees unusual things  . Morphine And Related Nausea Only  . Sulfa Antibiotics Rash    REVIEW OF SYSTEMS: Cardiac:  Hypertension since about 2004. Gastrointestinal:  No history of stomach disease.  No history of liver disease.  No history of gall bladder disease.  No history of pancreas disease.  Negative colonoscopy by Dr. Marina Goodell 2014. Urologic:  No history of kidney stones.  No history of bladder infections. GYN:  Hysterectomy 2002.  Sees Dr. Jennette Kettle from a Gyn standpoint.  SOCIAL and FAMILY HISTORY: Married. 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 Has 2 daughters - ages 48 and 76. Her mother had lung cancer and needed a porta cath. Her husband had hernia surgery by Dr. Gwinda Passe.  PHYSICAL EXAM: BP 118/72  Pulse 72  Temp(Src) 98.2 F (36.8  C) (Temporal)  Resp 14  Ht 4\' 10"  (1.473 m)  Wt 107 lb 3.2 oz (48.626 kg)  BMI 22.41 kg/m2  General: WN WF who is alert and generally healthy appearing.  HEENT:  Pupils equal.  Dentition good. NECK:  Supple.  No thyroid mass. LYMPH NODES:  No cervical, supraclavicular, or axillary adenopathy. BREASTS -  RIGHT:  Port upper inner quad.  No mass   LEFT:  No palpable mass or nodule.  No nipple discharge. UPPER EXTREMITIES:  No evidence of lymphedema.  He skin is slightly tanned.  DATA REVIEWED: Epic notes.  Ovidio Kin, MD,  [facility] [address] [place]  [address]   [place], [place]    [address] Phone:  [phone] FAX:  [phone]

## 2013-08-05 ENCOUNTER — Ambulatory Visit (HOSPITAL_BASED_OUTPATIENT_CLINIC_OR_DEPARTMENT_OTHER): Payer: Medicare Other

## 2013-08-05 ENCOUNTER — Other Ambulatory Visit (HOSPITAL_BASED_OUTPATIENT_CLINIC_OR_DEPARTMENT_OTHER): Payer: Medicare Other | Admitting: Lab

## 2013-08-05 ENCOUNTER — Encounter: Payer: Self-pay | Admitting: Adult Health

## 2013-08-05 ENCOUNTER — Ambulatory Visit (HOSPITAL_BASED_OUTPATIENT_CLINIC_OR_DEPARTMENT_OTHER): Payer: Medicare Other | Admitting: Adult Health

## 2013-08-05 VITALS — BP 168/70 | HR 94 | Temp 99.2°F | Resp 18 | Ht <= 58 in | Wt 104.1 lb

## 2013-08-05 DIAGNOSIS — J3489 Other specified disorders of nose and nasal sinuses: Secondary | ICD-10-CM

## 2013-08-05 DIAGNOSIS — C50912 Malignant neoplasm of unspecified site of left female breast: Secondary | ICD-10-CM

## 2013-08-05 DIAGNOSIS — C50919 Malignant neoplasm of unspecified site of unspecified female breast: Secondary | ICD-10-CM

## 2013-08-05 DIAGNOSIS — Z5111 Encounter for antineoplastic chemotherapy: Secondary | ICD-10-CM

## 2013-08-05 DIAGNOSIS — C50112 Malignant neoplasm of central portion of left female breast: Secondary | ICD-10-CM

## 2013-08-05 DIAGNOSIS — Z170 Estrogen receptor positive status [ER+]: Secondary | ICD-10-CM

## 2013-08-05 LAB — COMPREHENSIVE METABOLIC PANEL (CC13)
ALT: 36 U/L (ref 0–55)
AST: 22 U/L (ref 5–34)
Anion Gap: 10 mEq/L (ref 3–11)
CO2: 26 mEq/L (ref 22–29)
Calcium: 9.3 mg/dL (ref 8.4–10.4)
Chloride: 99 mEq/L (ref 98–109)
Potassium: 4.5 mEq/L (ref 3.5–5.1)
Sodium: 136 mEq/L (ref 136–145)
Total Bilirubin: 0.45 mg/dL (ref 0.20–1.20)
Total Protein: 6.2 g/dL — ABNORMAL LOW (ref 6.4–8.3)

## 2013-08-05 LAB — CBC WITH DIFFERENTIAL/PLATELET
BASO%: 0 % (ref 0.0–2.0)
EOS%: 0.6 % (ref 0.0–7.0)
HCT: 32.9 % — ABNORMAL LOW (ref 34.8–46.6)
MCH: 30.2 pg (ref 25.1–34.0)
MCHC: 33.1 g/dL (ref 31.5–36.0)
MONO#: 0.5 10*3/uL (ref 0.1–0.9)
NEUT#: 5.7 10*3/uL (ref 1.5–6.5)
RBC: 3.61 10*6/uL — ABNORMAL LOW (ref 3.70–5.45)
RDW: 17.4 % — ABNORMAL HIGH (ref 11.2–14.5)
WBC: 7.1 10*3/uL (ref 3.9–10.3)
lymph#: 0.9 10*3/uL (ref 0.9–3.3)

## 2013-08-05 MED ORDER — DEXTROSE 5 % IV SOLN
80.0000 mg/m2 | Freq: Once | INTRAVENOUS | Status: AC
  Administered 2013-08-05: 108 mg via INTRAVENOUS
  Filled 2013-08-05: qty 18

## 2013-08-05 MED ORDER — DEXAMETHASONE SODIUM PHOSPHATE 20 MG/5ML IJ SOLN
INTRAMUSCULAR | Status: AC
  Filled 2013-08-05: qty 5

## 2013-08-05 MED ORDER — HEPARIN SOD (PORK) LOCK FLUSH 100 UNIT/ML IV SOLN
500.0000 [IU] | Freq: Once | INTRAVENOUS | Status: AC | PRN
  Administered 2013-08-05: 500 [IU]
  Filled 2013-08-05: qty 5

## 2013-08-05 MED ORDER — DIPHENHYDRAMINE HCL 50 MG/ML IJ SOLN
50.0000 mg | Freq: Once | INTRAMUSCULAR | Status: AC
  Administered 2013-08-05: 50 mg via INTRAVENOUS

## 2013-08-05 MED ORDER — SODIUM CHLORIDE 0.9 % IJ SOLN
10.0000 mL | INTRAMUSCULAR | Status: DC | PRN
  Administered 2013-08-05: 10 mL
  Filled 2013-08-05: qty 10

## 2013-08-05 MED ORDER — FAMOTIDINE IN NACL 20-0.9 MG/50ML-% IV SOLN
INTRAVENOUS | Status: AC
  Filled 2013-08-05: qty 50

## 2013-08-05 MED ORDER — ONDANSETRON 16 MG/50ML IVPB (CHCC)
INTRAVENOUS | Status: AC
  Filled 2013-08-05: qty 16

## 2013-08-05 MED ORDER — DEXAMETHASONE SODIUM PHOSPHATE 20 MG/5ML IJ SOLN
20.0000 mg | Freq: Once | INTRAMUSCULAR | Status: AC
  Administered 2013-08-05: 20 mg via INTRAVENOUS

## 2013-08-05 MED ORDER — DIPHENHYDRAMINE HCL 50 MG/ML IJ SOLN
INTRAMUSCULAR | Status: AC
  Filled 2013-08-05: qty 1

## 2013-08-05 MED ORDER — FAMOTIDINE IN NACL 20-0.9 MG/50ML-% IV SOLN
20.0000 mg | Freq: Once | INTRAVENOUS | Status: AC
  Administered 2013-08-05: 20 mg via INTRAVENOUS

## 2013-08-05 MED ORDER — SODIUM CHLORIDE 0.9 % IV SOLN
Freq: Once | INTRAVENOUS | Status: AC
  Administered 2013-08-05: 11:00:00 via INTRAVENOUS

## 2013-08-05 MED ORDER — SODIUM CHLORIDE 0.9 % IV SOLN
132.0000 mg | Freq: Once | INTRAVENOUS | Status: AC
  Administered 2013-08-05: 130 mg via INTRAVENOUS
  Filled 2013-08-05: qty 13

## 2013-08-05 MED ORDER — ONDANSETRON 16 MG/50ML IVPB (CHCC)
16.0000 mg | Freq: Once | INTRAVENOUS | Status: AC
  Administered 2013-08-05: 16 mg via INTRAVENOUS

## 2013-08-05 NOTE — Progress Notes (Addendum)
[facility]  Telephone:([phone] Fax:([phone]  OFFICE PROGRESS NOTE   ID: Ellen Beltran   DOB: October 31, 1945  MR#: [id number]  JYN#:[id number]   PCP: [name], MD   DIAGNOSIS: Ellen Beltran is a 67 y.o. female diagnosed in 04/2013 with a clinical stage IIA, estrogen receptor negative, progesterone receptor negative, HER-2/neu neg, invasive ductal carcinoma of the left breast.   PRIOR THERAPY: 1. Patient went for annual screening mammogram in 03/2013 and mammography noted a mass at the 12 o'clock position. Diagnostic mammogram and ultrasound of the left breast was performed on 03/30/2013.  The ultrasound showed a 1.8 cm mass in the 12 o'clock position of the left breast.  Bilateral breast MRI performed on 04/25/2013 showed a 2.4 cm area of concern in the 12 o'clock position of the left breast. There were no abnormal appearing axillary subpectoral or internal mammary lymph nodes.  Left breast needle core biopsy performed on 04/15/2013 revealed invasive carcinoma with papillary features consistent with a ductal phenotype. Tumor grade was intermediate to high grade, estrogen receptor negative, progesterone receptor negative, with a proliferation marker Ki-67 of 47%, HER-2/neu by CISH showed no amplification.  2.  2-D echocardiogram performed on 05/06/2013 showed a left ventricular ejection fraction of 60%.  3. Neoadjuvant chemotherapy consisting of AC (Adriamycin/Cytoxan) began on 05/13/2013 through 06/24/2013 x 4 cycles with Neulasta injection for granulocyte support administered on day 2 of each cycle.    4.  Neoadjuvant chemotherapy consisting of Taxol/Carboplatin x 12 weekly cycles scheduled that started on 07/08/2013.   CURRENT THERAPY: Taxol/Carbo week 5  INTERVAL HISTORY: Ellen Beltran 67 y.o. female returns for follow up and evaluation prior to chemotherapy.  She is having some mild bloody nasal drainage when blowing her nose.  She is using the  saline nasal spray two to three times per day.  During her physical exam I noticed a maculopapular rash on her arms.  She says she's had it for 2-3 weeks, and was recommended Aquaphor by Dr. Welton Flakes and has only been applying it on there for one week and it is stable.  Otherwise, she denies fevers, chills, nausea, vomiting, constipation, diarrhea, numbness or any further concerns.     MEDICAL HISTORY: Past Medical History  Diagnosis Date  . Hypertension   . Hyperlipidemia   . Osteopenia   . Arthritis   . Hot flashes   . Cancer     left breast    ALLERGIES:   Allergies  Allergen Reactions  . Codeine Other (See Comments)    Sees unusual things  . Morphine And Related Nausea Only  . Sulfa Antibiotics Rash    MEDICATIONS:  Current Outpatient Prescriptions  Medication Sig Dispense Refill  . dexamethasone (DECADRON) 4 MG tablet Take 2 tablets (8 mg total) by mouth 2 (two) times daily. BID x 3 days after chemo  60 tablet  1  . lidocaine-prilocaine (EMLA) cream Apply topically as needed.  30 g  6  . LORazepam (ATIVAN) 0.5 MG tablet Take 1 tablet (0.5 mg total) by mouth every 6 (six) hours as needed (Nausea or vomiting).  30 tablet  0  . ondansetron (ZOFRAN) 8 MG tablet Take 8 mg by mouth every 8 (eight) hours as needed (Take 72 hours after chemotherapy).       Marland Kitchen PRESCRIPTION MEDICATION Chemotherapy at [facility].      Marland Kitchen prochlorperazine (COMPAZINE) 10 MG tablet Take 10 mg by mouth every 8 (eight) hours as needed.       Marland Kitchen  simvastatin (ZOCOR) 40 MG tablet Take 40 mg by mouth every evening.      . ciprofloxacin (CIPRO) 500 MG tablet Take 1 tablet (500 mg total) by mouth 2 (two) times daily.  14 tablet  0   No current facility-administered medications for this visit.   Facility-Administered Medications Ordered in Other Visits  Medication Dose Route Frequency Provider Last Rate Last Dose  . CARBOplatin (PARAPLATIN) 130 mg in sodium chloride 0.9 % 100 mL chemo infusion  130 mg Intravenous Once  Victorino December, MD      . heparin lock flush 100 unit/mL  500 Units Intracatheter Once PRN Victorino December, MD      . PACLitaxel (TAXOL) 108 mg in dextrose 5 % 250 mL chemo infusion (</= 80mg /m2)  80 mg/m2 (Treatment Plan Actual) Intravenous Once Victorino December, MD 268 mL/hr at 08/05/13 1208 108 mg at 08/05/13 1208  . sodium chloride 0.9 % injection 10 mL  10 mL Intracatheter PRN Victorino December, MD        SURGICAL HISTORY:  Past Surgical History  Procedure Laterality Date  . Cardiac catheterization  2004  . Tubal ligation  age 63  . Total abdominal hysterectomy  age 58 or 49  . Breast biopsy Left aug 2014  . Portacath placement N/A 05/02/2013    Procedure: INSERTION PORT-A-CATH;  Surgeon: Kandis Cocking, MD;  Location: [facility];  Service: General;  Laterality: N/A;    REVIEW OF SYSTEMS:  A 10 point review of systems was completed and is negative except as noted above.    PHYSICAL EXAMINATION: Blood pressure 168/70, pulse 94, temperature 99.2 F (37.3 C), temperature source Oral, resp. rate 18, height 4\' 10"  (1.473 m), weight 104 lb 1.6 oz (47.219 kg). Body mass index is 21.76 kg/(m^2). General: Patient is a well appearing female in no acute distress HEENT: PERRLA, sclerae anicteric no conjunctival pallor, MMM Neck: supple, no palpable adenopathy Lungs: clear to auscultation bilaterally, no wheezes, rhonchi, or rales Cardiovascular: regular rate rhythm, S1, S2, no murmurs, rubs or gallops Abdomen: Soft, non-tender, non-distended, normoactive bowel sounds, no HSM Extremities: warm and well perfused, no clubbing, cyanosis, or edema Skin: No rashes or lesions, maculopapular rash on arms only, patient also has vitiligo Neuro: Non-focal Breasts: unable to palpate left breast mass.  ECOG PERFORMANCE STATUS: 1 - Symptomatic but completely ambulatory  LABORATORY DATA: Lab Results  Component Value Date   WBC 7.1 08/05/2013   HGB 10.9* 08/05/2013   HCT 32.9* 08/05/2013   MCV 91.1 08/05/2013    PLT 133* 08/05/2013      Chemistry      Component Value Date/Time   NA 136 08/05/2013 0958   NA 137 05/22/2013 0246   K 4.5 08/05/2013 0958   K 4.2 05/22/2013 0246   CL 108 05/22/2013 0246   CO2 26 08/05/2013 0958   CO2 23 05/22/2013 0246   BUN 11.8 08/05/2013 0958   BUN 13 05/22/2013 0246   CREATININE 0.7 08/05/2013 0958   CREATININE 0.62 05/22/2013 0246      Component Value Date/Time   CALCIUM 9.3 08/05/2013 0958   CALCIUM 8.6 05/22/2013 0246   ALKPHOS 66 08/05/2013 0958   ALKPHOS 75 05/19/2013 1856   AST 22 08/05/2013 0958   AST 8 05/19/2013 1856   ALT 36 08/05/2013 0958   ALT 11 05/19/2013 1856   BILITOT 0.45 08/05/2013 0958   BILITOT 0.7 05/19/2013 1856       RADIOGRAPHIC STUDIES: No results found.  ASSESSMENT: Ellen Beltran is a 67 y.o. female with:  #1 Screening mammogram in 03/2013 noted a mass at the 12 o'clock position.  Diagnostic mammogram and ultrasound of the left breast was performed on 03/30/2013.  The ultrasound showed a 1.8 cm mass in the 12 o'clock position of the left breast. Bilateral breast MRI  performed on 04/25/2013 showed a 2.4 cm area of concern in the 12 o'clock position of the left breast. There were no abnormal appearing axillary subpectoral or internal mammary lymph nodes.  Left breast needle core biopsy performed on 04/15/2013 revealed invasive carcinoma with papillary features consistent with a ductal phenotype. Tumor grade was intermediate to high grade, estrogen receptor negative, progesterone receptor negative, with a proliferation marker Ki-67 of 47%, HER-2/neu by CISH showed no amplification.  #2  2-D echocardiogram performed on 05/06/2013 showed a left ventricular ejection fraction of 60%.  #3 Neoadjuvant chemotherapy consisting of AC (Adriamycin/Cytoxan) began on 05/13/2013 through 06/24/2013 x 4 cycles with Neulasta injection for granulocyte support administered on day 2 of each cycle.    #4  Neoadjuvant chemotherapy consisting of Taxol/Carboplatin  x 12 weekly cycles started on 07/08/2013.   PLAN:  #1 Doing well.  Patient will proceed with chemotherapy today.  I reviewed her labs with her in detail.  She will continue aquaphor for the rash and contact us if it worsens.    #2 She will continue saline nasal spray for the bloody nasal discharge.    #3 She will return next week for labs, evaluation and cycle 6 of chemotherapy treatment.    All questions answered. Mr. and Mrs. List were encouraged to contact us in the interim with any questions, concerns, or problems.  I spent 25 minutes counseling the patient face to face.  The total time spent in the appointment was 30 minutes.  Illa Level, NP Medical Oncology [facility] [phone] 08/05/2013    12:44 PM  ATTENDING'S ATTESTATION:  I personally reviewed patient's chart, examined patient myself, formulated the treatment plan as followed.    Patient is currently receiving adjuvant chemotherapy consisting of Taxol carboplatinum weekly. So far she is tolerating it well. Only issue is the rash on her upper extremities. We have recommended that she continue Aquaphor. Patient also has had occasional bloody just nasal discharge she is on saline nasal drops. We will continue to follow her very closely she will be seen back in one week's time for followup and labs.  Drue Second, MD Medical/Oncology [facility] [phone] (beeper) [phone] (Office)  08/21/2013, 10:44 PM

## 2013-08-05 NOTE — Patient Instructions (Signed)
[facility] Discharge Instructions for Patients Receiving Chemotherapy  Today you received the following chemotherapy agents: Taxol and Carboplatin.  To help prevent nausea and vomiting after your treatment, we encourage you to take your nausea medication, Zofran. Take one twice daily for 2 days. Begin the morning of 11/22.   If you develop nausea and vomiting that is not controlled by your nausea medication, call the clinic.   BELOW ARE SYMPTOMS THAT SHOULD BE REPORTED IMMEDIATELY:  *FEVER GREATER THAN 100.5 F  *CHILLS WITH OR WITHOUT FEVER  NAUSEA AND VOMITING THAT IS NOT CONTROLLED WITH YOUR NAUSEA MEDICATION  *UNUSUAL SHORTNESS OF BREATH  *UNUSUAL BRUISING OR BLEEDING  TENDERNESS IN MOUTH AND THROAT WITH OR WITHOUT PRESENCE OF ULCERS  *URINARY PROBLEMS  *BOWEL PROBLEMS  UNUSUAL RASH Items with * indicate a potential emergency and should be followed up as soon as possible.  Feel free to call the clinic should you have any questions or concerns. The clinic phone number is [phone].    

## 2013-08-12 ENCOUNTER — Ambulatory Visit (HOSPITAL_BASED_OUTPATIENT_CLINIC_OR_DEPARTMENT_OTHER): Payer: Medicare Other | Admitting: Adult Health

## 2013-08-12 ENCOUNTER — Telehealth: Payer: Self-pay | Admitting: *Deleted

## 2013-08-12 ENCOUNTER — Other Ambulatory Visit (HOSPITAL_BASED_OUTPATIENT_CLINIC_OR_DEPARTMENT_OTHER): Payer: Medicare Other | Admitting: Lab

## 2013-08-12 ENCOUNTER — Other Ambulatory Visit: Payer: Self-pay | Admitting: Hematology & Oncology

## 2013-08-12 ENCOUNTER — Ambulatory Visit (HOSPITAL_BASED_OUTPATIENT_CLINIC_OR_DEPARTMENT_OTHER): Payer: Medicare Other

## 2013-08-12 ENCOUNTER — Encounter: Payer: Self-pay | Admitting: Adult Health

## 2013-08-12 VITALS — BP 157/93 | HR 97 | Temp 98.5°F | Resp 18 | Ht <= 58 in | Wt 104.6 lb

## 2013-08-12 DIAGNOSIS — C50919 Malignant neoplasm of unspecified site of unspecified female breast: Secondary | ICD-10-CM

## 2013-08-12 DIAGNOSIS — Z171 Estrogen receptor negative status [ER-]: Secondary | ICD-10-CM

## 2013-08-12 DIAGNOSIS — C50112 Malignant neoplasm of central portion of left female breast: Secondary | ICD-10-CM

## 2013-08-12 DIAGNOSIS — C50912 Malignant neoplasm of unspecified site of left female breast: Secondary | ICD-10-CM

## 2013-08-12 DIAGNOSIS — R21 Rash and other nonspecific skin eruption: Secondary | ICD-10-CM

## 2013-08-12 DIAGNOSIS — R030 Elevated blood-pressure reading, without diagnosis of hypertension: Secondary | ICD-10-CM

## 2013-08-12 DIAGNOSIS — Z5111 Encounter for antineoplastic chemotherapy: Secondary | ICD-10-CM

## 2013-08-12 LAB — COMPREHENSIVE METABOLIC PANEL (CC13)
AST: 27 U/L (ref 5–34)
Alkaline Phosphatase: 69 U/L (ref 40–150)
Anion Gap: 10 mEq/L (ref 3–11)
BUN: 12.6 mg/dL (ref 7.0–26.0)
CO2: 25 mEq/L (ref 22–29)
Creatinine: 0.7 mg/dL (ref 0.6–1.1)
Glucose: 95 mg/dl (ref 70–140)
Sodium: 136 mEq/L (ref 136–145)
Total Bilirubin: 0.4 mg/dL (ref 0.20–1.20)
Total Protein: 6.3 g/dL — ABNORMAL LOW (ref 6.4–8.3)

## 2013-08-12 LAB — CBC WITH DIFFERENTIAL/PLATELET
Basophils Absolute: 0 10*3/uL (ref 0.0–0.1)
Eosinophils Absolute: 0 10*3/uL (ref 0.0–0.5)
HCT: 32.5 % — ABNORMAL LOW (ref 34.8–46.6)
HGB: 10.8 g/dL — ABNORMAL LOW (ref 11.6–15.9)
LYMPH%: 19.9 % (ref 14.0–49.7)
MCV: 90.8 fL (ref 79.5–101.0)
MONO%: 6.1 % (ref 0.0–14.0)
NEUT#: 3.2 10*3/uL (ref 1.5–6.5)
NEUT%: 73.1 % (ref 38.4–76.8)
Platelets: 166 10*3/uL (ref 145–400)
RBC: 3.58 10*6/uL — ABNORMAL LOW (ref 3.70–5.45)

## 2013-08-12 MED ORDER — SODIUM CHLORIDE 0.9 % IJ SOLN
10.0000 mL | INTRAMUSCULAR | Status: DC | PRN
  Administered 2013-08-12: 10 mL
  Filled 2013-08-12: qty 10

## 2013-08-12 MED ORDER — DEXAMETHASONE SODIUM PHOSPHATE 20 MG/5ML IJ SOLN
INTRAMUSCULAR | Status: AC
  Filled 2013-08-12: qty 5

## 2013-08-12 MED ORDER — HEPARIN SOD (PORK) LOCK FLUSH 100 UNIT/ML IV SOLN
500.0000 [IU] | Freq: Once | INTRAVENOUS | Status: AC | PRN
  Administered 2013-08-12: 500 [IU]
  Filled 2013-08-12: qty 5

## 2013-08-12 MED ORDER — DIPHENHYDRAMINE HCL 50 MG/ML IJ SOLN
50.0000 mg | Freq: Once | INTRAMUSCULAR | Status: AC
  Administered 2013-08-12: 50 mg via INTRAVENOUS

## 2013-08-12 MED ORDER — DIPHENHYDRAMINE HCL 50 MG/ML IJ SOLN
INTRAMUSCULAR | Status: AC
  Filled 2013-08-12: qty 1

## 2013-08-12 MED ORDER — FAMOTIDINE IN NACL 20-0.9 MG/50ML-% IV SOLN
INTRAVENOUS | Status: AC
  Filled 2013-08-12: qty 50

## 2013-08-12 MED ORDER — DEXAMETHASONE SODIUM PHOSPHATE 20 MG/5ML IJ SOLN
20.0000 mg | Freq: Once | INTRAMUSCULAR | Status: AC
  Administered 2013-08-12: 20 mg via INTRAVENOUS

## 2013-08-12 MED ORDER — PACLITAXEL CHEMO INJECTION 300 MG/50ML
80.0000 mg/m2 | Freq: Once | INTRAVENOUS | Status: AC
  Administered 2013-08-12: 108 mg via INTRAVENOUS
  Filled 2013-08-12: qty 18

## 2013-08-12 MED ORDER — SODIUM CHLORIDE 0.9 % IV SOLN
132.0000 mg | Freq: Once | INTRAVENOUS | Status: AC
  Administered 2013-08-12: 130 mg via INTRAVENOUS
  Filled 2013-08-12: qty 13

## 2013-08-12 MED ORDER — FAMOTIDINE IN NACL 20-0.9 MG/50ML-% IV SOLN
20.0000 mg | Freq: Once | INTRAVENOUS | Status: AC
  Administered 2013-08-12: 20 mg via INTRAVENOUS

## 2013-08-12 MED ORDER — ONDANSETRON 16 MG/50ML IVPB (CHCC)
INTRAVENOUS | Status: AC
  Filled 2013-08-12: qty 16

## 2013-08-12 MED ORDER — ONDANSETRON 16 MG/50ML IVPB (CHCC)
16.0000 mg | Freq: Once | INTRAVENOUS | Status: AC
  Administered 2013-08-12: 16 mg via INTRAVENOUS

## 2013-08-12 MED ORDER — SODIUM CHLORIDE 0.9 % IV SOLN
Freq: Once | INTRAVENOUS | Status: AC
  Administered 2013-08-12: 12:00:00 via INTRAVENOUS

## 2013-08-12 NOTE — Patient Instructions (Signed)

## 2013-08-12 NOTE — Progress Notes (Signed)
[facility]  Telephone:(336) [phone] Fax:(336) [phone]  OFFICE PROGRESS NOTE   ID: Ellen Beltran   DOB: 1946-02-07  MR#: [id number]  JYN#:[id number]   PCP: [name], MD   DIAGNOSIS: Ellen Beltran is a 67 y.o. female diagnosed in 04/2013 with a clinical stage IIA, estrogen receptor negative, progesterone receptor negative, HER-2/neu neg, invasive ductal carcinoma of the left breast.   PRIOR THERAPY: 1. Patient went for annual screening mammogram in 03/2013 and mammography noted a mass at the 12 o'clock position. Diagnostic mammogram and ultrasound of the left breast was performed on 03/30/2013.  The ultrasound showed a 1.8 cm mass in the 12 o'clock position of the left breast.  Bilateral breast MRI performed on 04/25/2013 showed a 2.4 cm area of concern in the 12 o'clock position of the left breast. There were no abnormal appearing axillary subpectoral or internal mammary lymph nodes.  Left breast needle core biopsy performed on 04/15/2013 revealed invasive carcinoma with papillary features consistent with a ductal phenotype. Tumor grade was intermediate to high grade, estrogen receptor negative, progesterone receptor negative, with a proliferation marker Ki-67 of 47%, HER-2/neu by CISH showed no amplification.  2.  2-D echocardiogram performed on 05/06/2013 showed a left ventricular ejection fraction of 60%.  3. Neoadjuvant chemotherapy consisting of AC (Adriamycin/Cytoxan) began on 05/13/2013 through 06/24/2013 x 4 cycles with Neulasta injection for granulocyte support administered on day 2 of each cycle.    4.  Neoadjuvant chemotherapy consisting of Taxol/Carboplatin x 12 weekly cycles scheduled that started on 07/08/2013.   CURRENT THERAPY: Taxol/Carbo week 6  INTERVAL HISTORY: Ellen Beltran 67 y.o. female returns for follow up and evaluation prior to chemotherapy.  She is doing well today.  She continues to have a mild non-itching macular rash on her arms  that Dr. Welton Flakes evaluated and wants to monitor.  She states that it is stable.  She also has a 3-4 day history of clear nasal drainage with a non-productive cough.  She continues to use saline nasal spray.  She denies fevers, chills, shortness of breath, nausea, vomiting, constipation, diarrhea, numbness, swelling, itching, or any further concerns.    MEDICAL HISTORY: Past Medical History  Diagnosis Date  . Hypertension   . Hyperlipidemia   . Osteopenia   . Arthritis   . Hot flashes   . Cancer     left breast    ALLERGIES:   Allergies  Allergen Reactions  . Codeine Other (See Comments)    Sees unusual things  . Morphine And Related Nausea Only  . Sulfa Antibiotics Rash    MEDICATIONS:  Current Outpatient Prescriptions  Medication Sig Dispense Refill  . dexamethasone (DECADRON) 4 MG tablet Take 2 tablets (8 mg total) by mouth 2 (two) times daily. BID x 3 days after chemo  60 tablet  1  . lidocaine-prilocaine (EMLA) cream Apply topically as needed.  30 g  6  . LORazepam (ATIVAN) 0.5 MG tablet Take 1 tablet (0.5 mg total) by mouth every 6 (six) hours as needed (Nausea or vomiting).  30 tablet  0  . ondansetron (ZOFRAN) 8 MG tablet Take 8 mg by mouth every 8 (eight) hours as needed (Take 72 hours after chemotherapy).       Marland Kitchen PRESCRIPTION MEDICATION Chemotherapy at [facility].      Marland Kitchen prochlorperazine (COMPAZINE) 10 MG tablet Take 10 mg by mouth every 8 (eight) hours as needed.       . simvastatin (ZOCOR) 40 MG tablet Take 40  mg by mouth every evening.       No current facility-administered medications for this visit.    SURGICAL HISTORY:  Past Surgical History  Procedure Laterality Date  . Cardiac catheterization  2004  . Tubal ligation  age 70  . Total abdominal hysterectomy  age 41 or 59  . Breast biopsy Left aug 2014  . Portacath placement N/A 05/02/2013    Procedure: INSERTION PORT-A-CATH;  Surgeon: Kandis Cocking, MD;  Location: [facility];  Service: General;  Laterality: N/A;     REVIEW OF SYSTEMS:  A 10 point review of systems was completed and is negative except as noted above.    PHYSICAL EXAMINATION: Blood pressure 157/93, pulse 97, temperature 98.5 F (36.9 C), temperature source Oral, resp. rate 18, height 4\' 10"  (1.473 m), weight 104 lb 9.6 oz (47.446 kg). Body mass index is 21.87 kg/(m^2). General: Patient is a well appearing female in no acute distress HEENT: PERRLA, sclerae anicteric no conjunctival pallor, MMM Neck: supple, no palpable adenopathy Lungs: clear to auscultation bilaterally, no wheezes, rhonchi, or rales Cardiovascular: regular rate rhythm, S1, S2, no murmurs, rubs or gallops Abdomen: Soft, non-tender, non-distended, normoactive bowel sounds, no HSM Extremities: warm and well perfused, no clubbing, cyanosis, or edema Skin: No rashes or lesions, macular rash on arms only, patient also has vitiligo Neuro: Non-focal Breasts: unable to palpate left breast mass.  ECOG PERFORMANCE STATUS: 1 - Symptomatic but completely ambulatory  LABORATORY DATA: Lab Results  Component Value Date   WBC 4.4 08/12/2013   HGB 10.8* 08/12/2013   HCT 32.5* 08/12/2013   MCV 90.8 08/12/2013   PLT 166 08/12/2013      Chemistry      Component Value Date/Time   NA 136 08/12/2013 1022   NA 137 05/22/2013 0246   K 4.1 08/12/2013 1022   K 4.2 05/22/2013 0246   CL 108 05/22/2013 0246   CO2 25 08/12/2013 1022   CO2 23 05/22/2013 0246   BUN 12.6 08/12/2013 1022   BUN 13 05/22/2013 0246   CREATININE 0.7 08/12/2013 1022   CREATININE 0.62 05/22/2013 0246      Component Value Date/Time   CALCIUM 9.6 08/12/2013 1022   CALCIUM 8.6 05/22/2013 0246   ALKPHOS 69 08/12/2013 1022   ALKPHOS 75 05/19/2013 1856   AST 27 08/12/2013 1022   AST 8 05/19/2013 1856   ALT 41 08/12/2013 1022   ALT 11 05/19/2013 1856   BILITOT 0.40 08/12/2013 1022   BILITOT 0.7 05/19/2013 1856       RADIOGRAPHIC STUDIES: No results found.  ASSESSMENT: Ellen Beltran is a 67 y.o. female with:  #1  Screening mammogram in 03/2013 noted a mass at the 12 o'clock position.  Diagnostic mammogram and ultrasound of the left breast was performed on 03/30/2013.  The ultrasound showed a 1.8 cm mass in the 12 o'clock position of the left breast. Bilateral breast MRI  performed on 04/25/2013 showed a 2.4 cm area of concern in the 12 o'clock position of the left breast. There were no abnormal appearing axillary subpectoral or internal mammary lymph nodes.  Left breast needle core biopsy performed on 04/15/2013 revealed invasive carcinoma with papillary features consistent with a ductal phenotype. Tumor grade was intermediate to high grade, estrogen receptor negative, progesterone receptor negative, with a proliferation marker Ki-67 of 47%, HER-2/neu by CISH showed no amplification.  #2  2-D echocardiogram performed on 05/06/2013 showed a left ventricular ejection fraction of 60%.  #3 Neoadjuvant chemotherapy consisting of  AC (Adriamycin/Cytoxan) began on 05/13/2013 through 06/24/2013 x 4 cycles with Neulasta injection for granulocyte support administered on day 2 of each cycle.    #4  Neoadjuvant chemotherapy consisting of Taxol/Carboplatin x 12 weekly cycles started on 07/08/2013.   PLAN:  #1  Patient is doing well today.  Her CBC is stable, she will proceed with chemotherapy.   #2 I recommended a dermatology referral regarding the non-itching rash on her arms only.    #3 I will order an MRI for the end of December to evaluate her response to chemotherapy.    #4 her blood pressures have been mildly elevated the past 2 weeks.  She will record her blood pressure at home and bring these in next week.  We did stop her Lisinopril during her treatment with adriamycin/Cytoxan due to hypotension.    #5 She will take Claritin daily for her nasal drainage.  Should it not improve, or worsen she will call us and we will prescribe her an antibiotic.    All questions answered. Mr. and Mrs. Fleener were encouraged  to contact us in the interim with any questions, concerns, or problems.  I spent 25 minutes counseling the patient face to face.  The total time spent in the appointment was 30 minutes.  Illa Level, NP Medical Oncology [facility] [phone] 08/14/2013    8:46 AM

## 2013-08-12 NOTE — Telephone Encounter (Signed)
appts made and printed...td 

## 2013-08-12 NOTE — Patient Instructions (Signed)
[facility] Discharge Instructions for Patients Receiving Chemotherapy  Today you received the following chemotherapy agents: Taxol and Carboplatin.  To help prevent nausea and vomiting after your treatment, we encourage you to take your nausea medication, Zofran. Take one twice daily for 2 days. Begin the morning of 11/22.   If you develop nausea and vomiting that is not controlled by your nausea medication, call the clinic.   BELOW ARE SYMPTOMS THAT SHOULD BE REPORTED IMMEDIATELY:  *FEVER GREATER THAN 100.5 F  *CHILLS WITH OR WITHOUT FEVER  NAUSEA AND VOMITING THAT IS NOT CONTROLLED WITH YOUR NAUSEA MEDICATION  *UNUSUAL SHORTNESS OF BREATH  *UNUSUAL BRUISING OR BLEEDING  TENDERNESS IN MOUTH AND THROAT WITH OR WITHOUT PRESENCE OF ULCERS  *URINARY PROBLEMS  *BOWEL PROBLEMS  UNUSUAL RASH Items with * indicate a potential emergency and should be followed up as soon as possible.  Feel free to call the clinic should you have any questions or concerns. The clinic phone number is [phone].

## 2013-08-18 ENCOUNTER — Telehealth: Payer: Self-pay | Admitting: Oncology

## 2013-08-18 NOTE — Telephone Encounter (Signed)
lmonvm for pt re appt w/rebecca dawson, PA @ [facility]. the appt was scheduled w/them due to wait time @  [facility] too long and they recommended I call another practice. appt is 09/29/13 @ 11:30am [address] - [phone]. left in message appt d/t/locatio/number and pt to get info for this appt when she come in 12/5. message to [place]

## 2013-08-19 ENCOUNTER — Other Ambulatory Visit (HOSPITAL_BASED_OUTPATIENT_CLINIC_OR_DEPARTMENT_OTHER): Payer: Medicare Other | Admitting: Lab

## 2013-08-19 ENCOUNTER — Encounter: Payer: Self-pay | Admitting: Adult Health

## 2013-08-19 ENCOUNTER — Ambulatory Visit (HOSPITAL_BASED_OUTPATIENT_CLINIC_OR_DEPARTMENT_OTHER): Payer: Medicare Other | Admitting: Adult Health

## 2013-08-19 ENCOUNTER — Ambulatory Visit (HOSPITAL_BASED_OUTPATIENT_CLINIC_OR_DEPARTMENT_OTHER): Payer: Medicare Other

## 2013-08-19 VITALS — BP 147/82 | HR 118 | Temp 98.1°F | Resp 18 | Ht <= 58 in | Wt 103.3 lb

## 2013-08-19 DIAGNOSIS — R040 Epistaxis: Secondary | ICD-10-CM

## 2013-08-19 DIAGNOSIS — Z171 Estrogen receptor negative status [ER-]: Secondary | ICD-10-CM

## 2013-08-19 DIAGNOSIS — Z5111 Encounter for antineoplastic chemotherapy: Secondary | ICD-10-CM

## 2013-08-19 DIAGNOSIS — C50919 Malignant neoplasm of unspecified site of unspecified female breast: Secondary | ICD-10-CM

## 2013-08-19 DIAGNOSIS — C50912 Malignant neoplasm of unspecified site of left female breast: Secondary | ICD-10-CM

## 2013-08-19 DIAGNOSIS — R21 Rash and other nonspecific skin eruption: Secondary | ICD-10-CM

## 2013-08-19 DIAGNOSIS — C50112 Malignant neoplasm of central portion of left female breast: Secondary | ICD-10-CM

## 2013-08-19 DIAGNOSIS — C50119 Malignant neoplasm of central portion of unspecified female breast: Secondary | ICD-10-CM

## 2013-08-19 LAB — COMPREHENSIVE METABOLIC PANEL (CC13)
Albumin: 2.9 g/dL — ABNORMAL LOW (ref 3.5–5.0)
Alkaline Phosphatase: 74 U/L (ref 40–150)
Anion Gap: 11 mEq/L (ref 3–11)
BUN: 19.6 mg/dL (ref 7.0–26.0)
Calcium: 9.3 mg/dL (ref 8.4–10.4)
Glucose: 133 mg/dl (ref 70–140)
Potassium: 3.6 mEq/L (ref 3.5–5.1)
Sodium: 136 mEq/L (ref 136–145)

## 2013-08-19 LAB — CBC WITH DIFFERENTIAL/PLATELET
BASO%: 0.2 % (ref 0.0–2.0)
Basophils Absolute: 0 10*3/uL (ref 0.0–0.1)
EOS%: 0.2 % (ref 0.0–7.0)
HCT: 30.5 % — ABNORMAL LOW (ref 34.8–46.6)
HGB: 10.3 g/dL — ABNORMAL LOW (ref 11.6–15.9)
MCH: 30.6 pg (ref 25.1–34.0)
MCHC: 33.8 g/dL (ref 31.5–36.0)
MCV: 90.5 fL (ref 79.5–101.0)
MONO%: 9.8 % (ref 0.0–14.0)
NEUT%: 65.4 % (ref 38.4–76.8)
Platelets: 177 10*3/uL (ref 145–400)
RBC: 3.37 10*6/uL — ABNORMAL LOW (ref 3.70–5.45)
lymph#: 1 10*3/uL (ref 0.9–3.3)

## 2013-08-19 LAB — TECHNOLOGIST REVIEW

## 2013-08-19 MED ORDER — ONDANSETRON 16 MG/50ML IVPB (CHCC)
16.0000 mg | Freq: Once | INTRAVENOUS | Status: AC
  Administered 2013-08-19: 16 mg via INTRAVENOUS

## 2013-08-19 MED ORDER — SODIUM CHLORIDE 0.9 % IV SOLN
80.0000 mg/m2 | Freq: Once | INTRAVENOUS | Status: AC
  Administered 2013-08-19: 108 mg via INTRAVENOUS
  Filled 2013-08-19: qty 18

## 2013-08-19 MED ORDER — ONDANSETRON 16 MG/50ML IVPB (CHCC)
INTRAVENOUS | Status: AC
  Filled 2013-08-19: qty 16

## 2013-08-19 MED ORDER — SODIUM CHLORIDE 0.9 % IJ SOLN
10.0000 mL | INTRAMUSCULAR | Status: DC | PRN
  Administered 2013-08-19: 10 mL
  Filled 2013-08-19: qty 10

## 2013-08-19 MED ORDER — FLUTICASONE PROPIONATE 50 MCG/ACT NA SUSP: 1.0000 | Freq: Every day | NASAL | Status: DC

## 2013-08-19 MED ORDER — FAMOTIDINE IN NACL 20-0.9 MG/50ML-% IV SOLN
INTRAVENOUS | Status: AC
  Filled 2013-08-19: qty 50

## 2013-08-19 MED ORDER — SODIUM CHLORIDE 0.9 % IV SOLN
Freq: Once | INTRAVENOUS | Status: AC
  Administered 2013-08-19: 12:00:00 via INTRAVENOUS

## 2013-08-19 MED ORDER — DEXAMETHASONE SODIUM PHOSPHATE 20 MG/5ML IJ SOLN
20.0000 mg | Freq: Once | INTRAMUSCULAR | Status: AC
  Administered 2013-08-19: 20 mg via INTRAVENOUS

## 2013-08-19 MED ORDER — DIPHENHYDRAMINE HCL 50 MG/ML IJ SOLN
INTRAMUSCULAR | Status: AC
  Filled 2013-08-19: qty 1

## 2013-08-19 MED ORDER — HEPARIN SOD (PORK) LOCK FLUSH 100 UNIT/ML IV SOLN
500.0000 [IU] | Freq: Once | INTRAVENOUS | Status: AC | PRN
  Administered 2013-08-19: 500 [IU]
  Filled 2013-08-19: qty 5

## 2013-08-19 MED ORDER — SODIUM CHLORIDE 0.9 % IV SOLN
132.0000 mg | Freq: Once | INTRAVENOUS | Status: AC
  Administered 2013-08-19: 130 mg via INTRAVENOUS
  Filled 2013-08-19: qty 13

## 2013-08-19 MED ORDER — DEXAMETHASONE SODIUM PHOSPHATE 20 MG/5ML IJ SOLN
INTRAMUSCULAR | Status: AC
  Filled 2013-08-19: qty 5

## 2013-08-19 MED ORDER — FAMOTIDINE IN NACL 20-0.9 MG/50ML-% IV SOLN
20.0000 mg | Freq: Once | INTRAVENOUS | Status: AC
  Administered 2013-08-19: 20 mg via INTRAVENOUS

## 2013-08-19 MED ORDER — DIPHENHYDRAMINE HCL 50 MG/ML IJ SOLN
50.0000 mg | Freq: Once | INTRAMUSCULAR | Status: AC
  Administered 2013-08-19: 50 mg via INTRAVENOUS

## 2013-08-19 NOTE — Patient Instructions (Signed)
Fluticasone nasal solution What is this medicine? FLUTICASONE (floo TIK a sone) is a corticosteroid. It helps decrease inflammation in your nose. This medicine is used to treat the symptoms of allergies like sneezing, itching, and runny or stuffy nose. This medicine may be used for other purposes; ask your health care provider or pharmacist if you have questions. COMMON BRAND NAME(S): Flonase What should I tell my health care provider before I take this medicine? They need to know if you have any of these conditions: -infection, like tuberculosis, herpes, or fungal infection -recent surgery on nose or sinuses -taking corticosteroid by mouth -an unusual or allergic reaction to fluticasone, steroids, other medicines, foods, dyes, or preservatives -pregnant or trying to get pregnant -breast-feeding How should I use this medicine? This medicine is for use in the nose. Follow the directions on your prescription label. This medicine works best if used regularly. Do not use more often than directed. Make sure that you are using your nasal spray correctly. Ask you doctor or health care provider if you have any questions. Talk to your pediatrician regarding the use of this medicine in children. While this drug may be prescribed for children as young as 4 years old for selected conditions, precautions do apply. Overdosage: If you think you have taken too much of this medicine contact a poison control center or emergency room at once. NOTE: This medicine is only for you. Do not share this medicine with others. What if I miss a dose? If you miss a dose, use it as soon as you remember. If it is almost time for your next dose, use only that dose and continue with your regular schedule. Do not use double or extra doses. What may interact with this medicine? -ketoconazole -metyrapone -some medicines for HIV -vaccines This list may not describe all possible interactions. Give your health care provider a list  of all the medicines, herbs, non-prescription drugs, or dietary supplements you use. Also tell them if you smoke, drink alcohol, or use illegal drugs. Some items may interact with your medicine. What should I watch for while using this medicine? Visit your doctor or health care professional for regular checks on your progress. Some symptoms may improve within 12 hours after starting use. Check with your doctor or health care professional if there is no improvement in your condition after 3 weeks of use. Do not come in contact with people who have chickenpox or the measles while you are taking this medicine. If you do, call your doctor right away. What side effects may I notice from receiving this medicine? Side effects that you should report to your doctor or health care professional as soon as possible: -allergic reactions like skin rash, itching or hives, swelling of the face, lips, or tongue -changes in vision -flu-like symptoms -white patches or sores in the mouth or nose Side effects that usually do not require medical attention (report to your doctor or health care professional if they continue or are bothersome): -burning or irritation inside the nose or throat -cough -headache -nosebleed -unusual taste or smell This list may not describe all possible side effects. Call your doctor for medical advice about side effects. You may report side effects to FDA at 1-800-FDA-1088. Where should I keep my medicine? Keep out of the reach of children. Store at room temperature between 15 and 30 degrees C (59 and 86 degrees F). Throw away any unused medicine after the expiration date. NOTE: This sheet is a summary. It may   not cover all possible information. If you have questions about this medicine, talk to your doctor, pharmacist, or health care provider.  2014, Elsevier/Gold Standard. (2008-08-15 10:40:16)  

## 2013-08-19 NOTE — Patient Instructions (Signed)
[facility] Discharge Instructions for Patients Receiving Chemotherapy  Today you received the following chemotherapy agents Taxol/Carboplatin To help prevent nausea and vomiting after your treatment, we encourage you to take your nausea medication as prescribed.  If you develop nausea and vomiting that is not controlled by your nausea medication, call the clinic.   BELOW ARE SYMPTOMS THAT SHOULD BE REPORTED IMMEDIATELY:  *FEVER GREATER THAN 100.5 F  *CHILLS WITH OR WITHOUT FEVER  NAUSEA AND VOMITING THAT IS NOT CONTROLLED WITH YOUR NAUSEA MEDICATION  *UNUSUAL SHORTNESS OF BREATH  *UNUSUAL BRUISING OR BLEEDING  TENDERNESS IN MOUTH AND THROAT WITH OR WITHOUT PRESENCE OF ULCERS  *URINARY PROBLEMS  *BOWEL PROBLEMS  UNUSUAL RASH Items with * indicate a potential emergency and should be followed up as soon as possible.  Feel free to call the clinic you have any questions or concerns. The clinic phone number is [phone].    

## 2013-08-19 NOTE — Progress Notes (Addendum)
[facility]  Telephone:(336) [phone] Fax:(336) [phone]  OFFICE PROGRESS NOTE   ID: Ellen Beltran   DOB: 08-10-1946  MR#: [id number]  JYN#:[id number]   PCP: [name], MD   DIAGNOSIS: Ellen Beltran is a 67 y.o. female diagnosed in 04/2013 with a clinical stage IIA, estrogen receptor negative, progesterone receptor negative, HER-2/neu neg, invasive ductal carcinoma of the left breast.   PRIOR THERAPY: 1. Patient went for annual screening mammogram in 03/2013 and mammography noted a mass at the 12 o'clock position. Diagnostic mammogram and ultrasound of the left breast was performed on 03/30/2013.  The ultrasound showed a 1.8 cm mass in the 12 o'clock position of the left breast.  Bilateral breast MRI performed on 04/25/2013 showed a 2.4 cm area of concern in the 12 o'clock position of the left breast. There were no abnormal appearing axillary subpectoral or internal mammary lymph nodes.  Left breast needle core biopsy performed on 04/15/2013 revealed invasive carcinoma with papillary features consistent with a ductal phenotype. Tumor grade was intermediate to high grade, estrogen receptor negative, progesterone receptor negative, with a proliferation marker Ki-67 of 47%, HER-2/neu by CISH showed no amplification.  2.  2-D echocardiogram performed on 05/06/2013 showed a left ventricular ejection fraction of 60%.  3. Neoadjuvant chemotherapy consisting of AC (Adriamycin/Cytoxan) began on 05/13/2013 through 06/24/2013 x 4 cycles with Neulasta injection for granulocyte support administered on day 2 of each cycle.    4.  Neoadjuvant chemotherapy consisting of Taxol/Carboplatin x 12 weekly cycles scheduled that started on 07/08/2013.   CURRENT THERAPY: Taxol/Carbo week 7  INTERVAL HISTORY: Ellen Beltran 67 y.o. female returns for evaluation prior to her weekly Taxol/Carbo.  Her rash according to her and her husband is stable.  Her skin is very dry.  She has evaluation  by dermatology on 09/19/12.  She has had mild low back pain on Wednesday and Thursday.  She did not over-exert herself, and it was not severe enough to take any Tylenol or aleve for it.  She has noticed no change in urination, dysuria, or any other problems.  Otherwise, she denies fevers, chills, nausea, vomiting, constipation, diarrhea, numbness, or any further concerns.   MEDICAL HISTORY: Past Medical History  Diagnosis Date  . Hypertension   . Hyperlipidemia   . Osteopenia   . Arthritis   . Hot flashes   . Cancer     left breast    ALLERGIES:   Allergies  Allergen Reactions  . Codeine Other (See Comments)    Sees unusual things  . Morphine And Related Nausea Only  . Sulfa Antibiotics Rash    MEDICATIONS:  Current Outpatient Prescriptions  Medication Sig Dispense Refill  . Benfotiamine 150 MG CAPS Take by mouth.      . Biotin 5000 MCG CAPS Take by mouth.      . dexamethasone (DECADRON) 4 MG tablet Take 2 tablets (8 mg total) by mouth 2 (two) times daily. BID x 3 days after chemo  60 tablet  1  . lidocaine-prilocaine (EMLA) cream Apply topically as needed.  30 g  6  . LORazepam (ATIVAN) 0.5 MG tablet Take 1 tablet (0.5 mg total) by mouth every 6 (six) hours as needed (Nausea or vomiting).  30 tablet  0  . Multiple Vitamins-Minerals (ALIVE WOMENS 50+ PO) Take by mouth. Take 2 gummies per day      . ondansetron (ZOFRAN) 8 MG tablet Take 8 mg by mouth every 8 (eight) hours as needed (Take  72 hours after chemotherapy).       Marland Kitchen PRESCRIPTION MEDICATION Chemotherapy at [facility].      Marland Kitchen prochlorperazine (COMPAZINE) 10 MG tablet Take 10 mg by mouth every 8 (eight) hours as needed.       . simvastatin (ZOCOR) 40 MG tablet Take 40 mg by mouth every evening.      . fluticasone (FLONASE) 50 MCG/ACT nasal spray Place 1 spray into both nostrils daily.  16 g  2   No current facility-administered medications for this visit.    SURGICAL HISTORY:  Past Surgical History  Procedure Laterality Date   . Cardiac catheterization  2004  . Tubal ligation  age 55  . Total abdominal hysterectomy  age 65 or 24  . Breast biopsy Left aug 2014  . Portacath placement N/A 05/02/2013    Procedure: INSERTION PORT-A-CATH;  Surgeon: Kandis Cocking, MD;  Location: [facility];  Service: General;  Laterality: N/A;    REVIEW OF SYSTEMS:  A 10 point review of systems was completed and is negative except as noted above.    PHYSICAL EXAMINATION: Blood pressure 147/82, pulse 118, temperature 98.1 F (36.7 C), temperature source Oral, resp. rate 18, height 4\' 10"  (1.473 m), weight 103 lb 4.8 oz (46.857 kg). Body mass index is 21.6 kg/(m^2). General: Patient is a well appearing female in no acute distress HEENT: PERRLA, sclerae anicteric no conjunctival pallor, MMM Neck: supple, no palpable adenopathy Lungs: clear to auscultation bilaterally, no wheezes, rhonchi, or rales Cardiovascular: regular rate rhythm, S1, S2, no murmurs, rubs or gallops Abdomen: Soft, non-tender, non-distended, normoactive bowel sounds, no HSM Extremities: warm and well perfused, no clubbing, cyanosis, or edema Skin: No rashes or lesions, macular rash on arms only, patient also has vitiligo Neuro: Non-focal Breasts: unable to palpate left breast mass.  ECOG PERFORMANCE STATUS: 1 - Symptomatic but completely ambulatory  LABORATORY DATA: Lab Results  Component Value Date   WBC 4.1 08/19/2013   HGB 10.3* 08/19/2013   HCT 30.5* 08/19/2013   MCV 90.5 08/19/2013   PLT 177 08/19/2013      Chemistry      Component Value Date/Time   NA 136 08/19/2013 1005   NA 137 05/22/2013 0246   K 3.6 08/19/2013 1005   K 4.2 05/22/2013 0246   CL 108 05/22/2013 0246   CO2 26 08/19/2013 1005   CO2 23 05/22/2013 0246   BUN 19.6 08/19/2013 1005   BUN 13 05/22/2013 0246   CREATININE 0.7 08/19/2013 1005   CREATININE 0.62 05/22/2013 0246      Component Value Date/Time   CALCIUM 9.3 08/19/2013 1005   CALCIUM 8.6 05/22/2013 0246   ALKPHOS 74 08/19/2013 1005   ALKPHOS 75  05/19/2013 1856   AST 31 08/19/2013 1005   AST 8 05/19/2013 1856   ALT 47 08/19/2013 1005   ALT 11 05/19/2013 1856   BILITOT 0.36 08/19/2013 1005   BILITOT 0.7 05/19/2013 1856       RADIOGRAPHIC STUDIES: No results found.  ASSESSMENT: ENVI EAGLESON is a 67 y.o. female with:  #1 Screening mammogram in 03/2013 noted a mass at the 12 o'clock position.  Diagnostic mammogram and ultrasound of the left breast was performed on 03/30/2013.  The ultrasound showed a 1.8 cm mass in the 12 o'clock position of the left breast. Bilateral breast MRI  performed on 04/25/2013 showed a 2.4 cm area of concern in the 12 o'clock position of the left breast. There were no abnormal appearing axillary subpectoral or  internal mammary lymph nodes.  Left breast needle core biopsy performed on 04/15/2013 revealed invasive carcinoma with papillary features consistent with a ductal phenotype. Tumor grade was intermediate to high grade, estrogen receptor negative, progesterone receptor negative, with a proliferation marker Ki-67 of 47%, HER-2/neu by CISH showed no amplification.  #2  2-D echocardiogram performed on 05/06/2013 showed a left ventricular ejection fraction of 60%.  #3 Neoadjuvant chemotherapy consisting of AC (Adriamycin/Cytoxan) began on 05/13/2013 through 06/24/2013 x 4 cycles with Neulasta injection for granulocyte support administered on day 2 of each cycle.    #4  Neoadjuvant chemotherapy consisting of Taxol/Carboplatin x 12 weekly cycles started on 07/08/2013.   PLAN:   #1 Ms. Schmale is doing well.  Her CBC is stable, I reviewed it with her in detail.  She will proceed with chemotherapy today.  #2 We discussed the rash.  She will apply Aquaphor or Cetaphil to her skin.  She has a dermatology appointment in January.  She will let us know if it worsens, but it has remained stable for 3 weeks now.    #3  She will return in 1 week, for labs, evaluation, and chemotherapy.    All questions answered. Mr. and  Mrs. Medaglia were encouraged to contact us in the interim with any questions, concerns, or problems.  I spent 25 minutes counseling the patient face to face.  The total time spent in the appointment was 30 minutes.  Illa Level, NP Medical Oncology [facility] [phone] 08/21/2013    7:54 AM  ATTENDING'S ATTESTATION:  I personally reviewed patient's chart, examined patient myself, formulated the treatment plan as followed.    Patient's rash on her arms continues to be present. We discussed the possibility of switching her treatment but patient states that her rash is not bothering her and she wants to continue taking Taxol carbo and spit of Gemzar carboplatin she does not want to Neulasta injection that goes along with the Gemzar carboplatinum combination. We discussed further management of the rash today. She is also having some nosebleeds off-and-on we will try to keep her Flonase. Although I did offer the patient a referral to ENT she declined.  Drue Second, MD Medical/Oncology [facility] [phone] (beeper) [phone] (Office)  08/22/2013, 1:01 AM

## 2013-08-20 ENCOUNTER — Ambulatory Visit
Admission: RE | Admit: 2013-08-20 | Discharge: 2013-08-20 | Disposition: A | Discharge status: Home / Self Care | Payer: Medicare Other | Source: Ambulatory Visit | Attending: Adult Health | Admitting: Adult Health

## 2013-08-20 DIAGNOSIS — C50112 Malignant neoplasm of central portion of left female breast: Secondary | ICD-10-CM

## 2013-08-20 MED ORDER — GADOBENATE DIMEGLUMINE 529 MG/ML IV SOLN
9.0000 mL | Freq: Once | INTRAVENOUS | Status: AC | PRN
  Administered 2013-08-20: 9 mL via INTRAVENOUS

## 2013-08-26 ENCOUNTER — Ambulatory Visit (HOSPITAL_BASED_OUTPATIENT_CLINIC_OR_DEPARTMENT_OTHER): Payer: Medicare Other | Admitting: Adult Health

## 2013-08-26 ENCOUNTER — Telehealth: Payer: Self-pay | Admitting: Oncology

## 2013-08-26 ENCOUNTER — Encounter: Payer: Self-pay | Admitting: Adult Health

## 2013-08-26 ENCOUNTER — Ambulatory Visit (HOSPITAL_BASED_OUTPATIENT_CLINIC_OR_DEPARTMENT_OTHER): Payer: Medicare Other

## 2013-08-26 ENCOUNTER — Other Ambulatory Visit (HOSPITAL_BASED_OUTPATIENT_CLINIC_OR_DEPARTMENT_OTHER): Payer: Medicare Other

## 2013-08-26 VITALS — BP 157/76 | HR 134 | Temp 98.9°F | Resp 18 | Ht <= 58 in | Wt 102.7 lb

## 2013-08-26 DIAGNOSIS — C50919 Malignant neoplasm of unspecified site of unspecified female breast: Secondary | ICD-10-CM

## 2013-08-26 DIAGNOSIS — Z171 Estrogen receptor negative status [ER-]: Secondary | ICD-10-CM

## 2013-08-26 DIAGNOSIS — C50112 Malignant neoplasm of central portion of left female breast: Secondary | ICD-10-CM

## 2013-08-26 DIAGNOSIS — Z5111 Encounter for antineoplastic chemotherapy: Secondary | ICD-10-CM

## 2013-08-26 DIAGNOSIS — C50912 Malignant neoplasm of unspecified site of left female breast: Secondary | ICD-10-CM

## 2013-08-26 DIAGNOSIS — J329 Chronic sinusitis, unspecified: Secondary | ICD-10-CM

## 2013-08-26 DIAGNOSIS — R21 Rash and other nonspecific skin eruption: Secondary | ICD-10-CM

## 2013-08-26 DIAGNOSIS — J3489 Other specified disorders of nose and nasal sinuses: Secondary | ICD-10-CM

## 2013-08-26 LAB — CBC WITH DIFFERENTIAL/PLATELET
BASO%: 0.9 % (ref 0.0–2.0)
EOS%: 0.2 % (ref 0.0–7.0)
HCT: 30.9 % — ABNORMAL LOW (ref 34.8–46.6)
HGB: 10.7 g/dL — ABNORMAL LOW (ref 11.6–15.9)
LYMPH%: 20.8 % (ref 14.0–49.7)
MCH: 31.4 pg (ref 25.1–34.0)
MCHC: 34.6 g/dL (ref 31.5–36.0)
MCV: 90.6 fL (ref 79.5–101.0)
MONO%: 8.2 % (ref 0.0–14.0)
NEUT%: 69.9 % (ref 38.4–76.8)
Platelets: 228 10*3/uL (ref 145–400)
RBC: 3.41 10*6/uL — ABNORMAL LOW (ref 3.70–5.45)
lymph#: 1.1 10*3/uL (ref 0.9–3.3)

## 2013-08-26 LAB — COMPREHENSIVE METABOLIC PANEL (CC13)
ALT: 57 U/L — ABNORMAL HIGH (ref 0–55)
AST: 30 U/L (ref 5–34)
Alkaline Phosphatase: 73 U/L (ref 40–150)
Anion Gap: 13 mEq/L — ABNORMAL HIGH (ref 3–11)
Creatinine: 0.7 mg/dL (ref 0.6–1.1)
Glucose: 123 mg/dl (ref 70–140)
Total Bilirubin: 0.58 mg/dL (ref 0.20–1.20)

## 2013-08-26 MED ORDER — SODIUM CHLORIDE 0.9 % IV SOLN
80.0000 mg/m2 | Freq: Once | INTRAVENOUS | Status: AC
  Administered 2013-08-26: 108 mg via INTRAVENOUS
  Filled 2013-08-26: qty 18

## 2013-08-26 MED ORDER — DEXAMETHASONE SODIUM PHOSPHATE 20 MG/5ML IJ SOLN
INTRAMUSCULAR | Status: AC
  Filled 2013-08-26: qty 5

## 2013-08-26 MED ORDER — DIPHENHYDRAMINE HCL 50 MG/ML IJ SOLN
50.0000 mg | Freq: Once | INTRAMUSCULAR | Status: AC
  Administered 2013-08-26: 50 mg via INTRAVENOUS

## 2013-08-26 MED ORDER — DIPHENHYDRAMINE HCL 50 MG/ML IJ SOLN
INTRAMUSCULAR | Status: AC
  Filled 2013-08-26: qty 1

## 2013-08-26 MED ORDER — SODIUM CHLORIDE 0.9 % IV SOLN
132.0000 mg | Freq: Once | INTRAVENOUS | Status: AC
  Administered 2013-08-26: 130 mg via INTRAVENOUS
  Filled 2013-08-26: qty 13

## 2013-08-26 MED ORDER — SODIUM CHLORIDE 0.9 % IJ SOLN
10.0000 mL | INTRAMUSCULAR | Status: DC | PRN
  Administered 2013-08-26: 10 mL
  Filled 2013-08-26: qty 10

## 2013-08-26 MED ORDER — ONDANSETRON 16 MG/50ML IVPB (CHCC)
INTRAVENOUS | Status: AC
  Filled 2013-08-26: qty 16

## 2013-08-26 MED ORDER — DEXAMETHASONE SODIUM PHOSPHATE 20 MG/5ML IJ SOLN
20.0000 mg | Freq: Once | INTRAMUSCULAR | Status: AC
  Administered 2013-08-26: 20 mg via INTRAVENOUS

## 2013-08-26 MED ORDER — HEPARIN SOD (PORK) LOCK FLUSH 100 UNIT/ML IV SOLN
500.0000 [IU] | Freq: Once | INTRAVENOUS | Status: AC | PRN
  Administered 2013-08-26: 500 [IU]
  Filled 2013-08-26: qty 5

## 2013-08-26 MED ORDER — FAMOTIDINE IN NACL 20-0.9 MG/50ML-% IV SOLN
INTRAVENOUS | Status: AC
  Filled 2013-08-26: qty 50

## 2013-08-26 MED ORDER — ONDANSETRON 16 MG/50ML IVPB (CHCC)
16.0000 mg | Freq: Once | INTRAVENOUS | Status: AC
  Administered 2013-08-26: 16 mg via INTRAVENOUS

## 2013-08-26 MED ORDER — SODIUM CHLORIDE 0.9 % IV SOLN
Freq: Once | INTRAVENOUS | Status: AC
  Administered 2013-08-26: 12:00:00 via INTRAVENOUS

## 2013-08-26 MED ORDER — FAMOTIDINE IN NACL 20-0.9 MG/50ML-% IV SOLN
20.0000 mg | Freq: Once | INTRAVENOUS | Status: AC
  Administered 2013-08-26: 20 mg via INTRAVENOUS

## 2013-08-26 NOTE — Telephone Encounter (Signed)
, °

## 2013-08-26 NOTE — Progress Notes (Addendum)
[facility]  Telephone:(336) [phone] Fax:(336) [phone]  OFFICE PROGRESS NOTE   ID: [name]   DOB: 03-11-1946  MR#: [id number]  VOZ#:[id number]   PCP: [name], MD   DIAGNOSIS: Ellen Beltran is a 67 y.o. female diagnosed in 04/2013 with a clinical stage IIA, estrogen receptor negative, progesterone receptor negative, HER-2/neu neg, invasive ductal carcinoma of the left breast.   PRIOR THERAPY: 1. Patient went for annual screening mammogram in 03/2013 and mammography noted a mass at the 12 o'clock position. Diagnostic mammogram and ultrasound of the left breast was performed on 03/30/2013.  The ultrasound showed a 1.8 cm mass in the 12 o'clock position of the left breast.  Bilateral breast MRI performed on 04/25/2013 showed a 2.4 cm area of concern in the 12 o'clock position of the left breast. There were no abnormal appearing axillary subpectoral or internal mammary lymph nodes.  Left breast needle core biopsy performed on 04/15/2013 revealed invasive carcinoma with papillary features consistent with a ductal phenotype. Tumor grade was intermediate to high grade, estrogen receptor negative, progesterone receptor negative, with a proliferation marker Ki-67 of 47%, HER-2/neu by CISH showed no amplification.  2.  2-D echocardiogram performed on 05/06/2013 showed a left ventricular ejection fraction of 60%.  3. Neoadjuvant chemotherapy consisting of AC (Adriamycin/Cytoxan) began on 05/13/2013 through 06/24/2013 x 4 cycles with Neulasta injection for granulocyte support administered on day 2 of each cycle.    4.  Neoadjuvant chemotherapy consisting of Taxol/Carboplatin x 12 weekly cycles scheduled that started on 07/08/2013.   CURRENT THERAPY: Taxol/Carbo week 8  INTERVAL HISTORY: Ellen Beltran 67 y.o. female returns for evaluation prior to her weekly Taxol/Carbo.  She is here for her eighth week of Taxol/Carbo.  She continues to have a stable skin rash that  doesn't itch.  She had an MRI breasts this week that demonstrated no significant change in her left breast mass.  Her sinuses have continued to bother her, and she continues to have nasal drainage.  Otherwise, she denies fevers, chills, nausea, vomiting, constipation, diarrhea, numbness, or any further concerns.   MEDICAL HISTORY: Past Medical History  Diagnosis Date  . Hypertension   . Hyperlipidemia   . Osteopenia   . Arthritis   . Hot flashes   . Cancer     left breast    ALLERGIES:   Allergies  Allergen Reactions  . Codeine Other (See Comments)    Sees unusual things  . Morphine And Related Nausea Only  . Sulfa Antibiotics Rash    MEDICATIONS:  Current Outpatient Prescriptions  Medication Sig Dispense Refill  . Benfotiamine 150 MG CAPS Take by mouth.      . Biotin 5000 MCG CAPS Take by mouth.      . fluticasone (FLONASE) 50 MCG/ACT nasal spray Place 1 spray into both nostrils daily.  16 g  2  . lidocaine-prilocaine (EMLA) cream Apply topically as needed.  30 g  6  . LORazepam (ATIVAN) 0.5 MG tablet Take 1 tablet (0.5 mg total) by mouth every 6 (six) hours as needed (Nausea or vomiting).  30 tablet  0  . Multiple Vitamins-Minerals (ALIVE WOMENS 50+ PO) Take by mouth. Take 2 gummies per day      . ondansetron (ZOFRAN) 8 MG tablet Take 8 mg by mouth every 8 (eight) hours as needed (Take 72 hours after chemotherapy).       Marland Kitchen PRESCRIPTION MEDICATION Chemotherapy at [facility].      . simvastatin (ZOCOR) 40  MG tablet Take 40 mg by mouth every evening.      Marland Kitchen dexamethasone (DECADRON) 4 MG tablet Take 2 tablets (8 mg total) by mouth 2 (two) times daily. BID x 3 days after chemo  60 tablet  1  . prochlorperazine (COMPAZINE) 10 MG tablet Take 10 mg by mouth every 8 (eight) hours as needed.        No current facility-administered medications for this visit.    SURGICAL HISTORY:  Past Surgical History  Procedure Laterality Date  . Cardiac catheterization  2004  . Tubal ligation  age 55   . Total abdominal hysterectomy  age 31 or 14  . Breast biopsy Left aug 2014  . Portacath placement N/A 05/02/2013    Procedure: INSERTION PORT-A-CATH;  Surgeon: Kandis Cocking, MD;  Location: [facility];  Service: General;  Laterality: N/A;    REVIEW OF SYSTEMS:  A 10 point review of systems was completed and is negative except as noted above.    PHYSICAL EXAMINATION: Blood pressure 157/76, pulse 134, temperature 98.9 F (37.2 C), temperature source Oral, resp. rate 18, height 4\' 10"  (1.473 m), weight 102 lb 11.2 oz (46.584 kg). Body mass index is 21.47 kg/(m^2). General: Patient is a well appearing female in no acute distress HEENT: PERRLA, sclerae anicteric no conjunctival pallor, MMM Neck: supple, no palpable adenopathy Lungs: clear to auscultation bilaterally, no wheezes, rhonchi, or rales Cardiovascular: regular rate rhythm, S1, S2, no murmurs, rubs or gallops Abdomen: Soft, non-tender, non-distended, normoactive bowel sounds, no HSM Extremities: warm and well perfused, no clubbing, cyanosis, or edema Skin: No rashes or lesions, macular rash on arms only, patient also has vitiligo Neuro: Non-focal Breasts: unable to palpate left breast mass.  ECOG PERFORMANCE STATUS: 1 - Symptomatic but completely ambulatory  LABORATORY DATA: Lab Results  Component Value Date   WBC 5.4 08/26/2013   HGB 10.7* 08/26/2013   HCT 30.9* 08/26/2013   MCV 90.6 08/26/2013   PLT 228 08/26/2013      Chemistry      Component Value Date/Time   NA 138 08/26/2013 0959   NA 137 05/22/2013 0246   K 3.8 08/26/2013 0959   K 4.2 05/22/2013 0246   CL 108 05/22/2013 0246   CO2 27 08/26/2013 0959   CO2 23 05/22/2013 0246   BUN 16.5 08/26/2013 0959   BUN 13 05/22/2013 0246   CREATININE 0.7 08/26/2013 0959   CREATININE 0.62 05/22/2013 0246      Component Value Date/Time   CALCIUM 9.7 08/26/2013 0959   CALCIUM 8.6 05/22/2013 0246   ALKPHOS 73 08/26/2013 0959   ALKPHOS 75 05/19/2013 1856   AST 30 08/26/2013 0959   AST 8  05/19/2013 1856   ALT 57* 08/26/2013 0959   ALT 11 05/19/2013 1856   BILITOT 0.58 08/26/2013 0959   BILITOT 0.7 05/19/2013 1856       RADIOGRAPHIC STUDIES: No results found.  ASSESSMENT: Ellen Beltran is a 67 y.o. female with:  #1 Screening mammogram in 03/2013 noted a mass at the 12 o'clock position.  Diagnostic mammogram and ultrasound of the left breast was performed on 03/30/2013.  The ultrasound showed a 1.8 cm mass in the 12 o'clock position of the left breast. Bilateral breast MRI  performed on 04/25/2013 showed a 2.4 cm area of concern in the 12 o'clock position of the left breast. There were no abnormal appearing axillary subpectoral or internal mammary lymph nodes.  Left breast needle core biopsy performed on 04/15/2013 revealed invasive  carcinoma with papillary features consistent with a ductal phenotype. Tumor grade was intermediate to high grade, estrogen receptor negative, progesterone receptor negative, with a proliferation marker Ki-67 of 47%, HER-2/neu by CISH showed no amplification.  #2  2-D echocardiogram performed on 05/06/2013 showed a left ventricular ejection fraction of 60%.  #3 Neoadjuvant chemotherapy consisting of AC (Adriamycin/Cytoxan) began on 05/13/2013 through 06/24/2013 x 4 cycles with Neulasta injection for granulocyte support administered on day 2 of each cycle.    #4  Neoadjuvant chemotherapy consisting of Taxol/Carboplatin x 12 weekly cycles started on 07/08/2013.   PLAN:   #1 Ms. Jurado is doing well.  Her CBC is stable, I reviewed it with her in detail.  She will proceed with chemotherapy today.  I reviewed her MRI with her.  She was very discouraged with the results.  I requested follow up with Dr. Ezzard Standing in the next couple of weeks.   #2 We discussed the rash.  She will continue apply Aquaphor or Cetaphil to her skin.  She has a dermatology appointment in January.  She will let us know if it worsens, but it has remained stable for 4 weeks now.     #3  She will return in 1 week, for labs, evaluation, and chemotherapy.    #4 I ordered a CT sinuses since she continues to have difficulty with nasal drainage.    All questions answered. Mr. and Mrs. Ziebarth were encouraged to contact us in the interim with any questions, concerns, or problems.  I spent 25 minutes counseling the patient face to face.  The total time spent in the appointment was 30 minutes.  Illa Level, NP Medical Oncology [facility] [phone] 08/28/2013    9:08 AM  ATTENDING'S ATTESTATION:  I personally reviewed patient's chart, examined patient myself, formulated the treatment plan as followed.    Doing well, rash remains prominent, we discussed breast MRI results, she will be seen by surgery as scheduled.  Drue Second, MD Medical/Oncology [facility] [phone] (beeper) [phone] (Office)  09/09/2013, 11:14 AM

## 2013-08-26 NOTE — Patient Instructions (Signed)
[facility] Discharge Instructions for Patients Receiving Chemotherapy  Today you received the following chemotherapy agents Taxol/Carboplatin To help prevent nausea and vomiting after your treatment, we encourage you to take your nausea medication as prescribed.  If you develop nausea and vomiting that is not controlled by your nausea medication, call the clinic.   BELOW ARE SYMPTOMS THAT SHOULD BE REPORTED IMMEDIATELY:  *FEVER GREATER THAN 100.5 F  *CHILLS WITH OR WITHOUT FEVER  NAUSEA AND VOMITING THAT IS NOT CONTROLLED WITH YOUR NAUSEA MEDICATION  *UNUSUAL SHORTNESS OF BREATH  *UNUSUAL BRUISING OR BLEEDING  TENDERNESS IN MOUTH AND THROAT WITH OR WITHOUT PRESENCE OF ULCERS  *URINARY PROBLEMS  *BOWEL PROBLEMS  UNUSUAL RASH Items with * indicate a potential emergency and should be followed up as soon as possible.  Feel free to call the clinic you have any questions or concerns. The clinic phone number is [phone].    

## 2013-09-02 ENCOUNTER — Telehealth: Payer: Self-pay | Admitting: Oncology

## 2013-09-02 ENCOUNTER — Encounter: Payer: Self-pay | Admitting: Adult Health

## 2013-09-02 ENCOUNTER — Telehealth: Payer: Self-pay | Admitting: *Deleted

## 2013-09-02 ENCOUNTER — Other Ambulatory Visit (HOSPITAL_BASED_OUTPATIENT_CLINIC_OR_DEPARTMENT_OTHER): Payer: Medicare Other

## 2013-09-02 ENCOUNTER — Ambulatory Visit (HOSPITAL_BASED_OUTPATIENT_CLINIC_OR_DEPARTMENT_OTHER): Payer: Medicare Other | Admitting: Adult Health

## 2013-09-02 ENCOUNTER — Ambulatory Visit (HOSPITAL_BASED_OUTPATIENT_CLINIC_OR_DEPARTMENT_OTHER): Payer: Medicare Other

## 2013-09-02 VITALS — BP 150/79 | HR 125 | Temp 98.4°F | Resp 20 | Ht <= 58 in | Wt 102.0 lb

## 2013-09-02 DIAGNOSIS — C50119 Malignant neoplasm of central portion of unspecified female breast: Secondary | ICD-10-CM

## 2013-09-02 DIAGNOSIS — G609 Hereditary and idiopathic neuropathy, unspecified: Secondary | ICD-10-CM

## 2013-09-02 DIAGNOSIS — R21 Rash and other nonspecific skin eruption: Secondary | ICD-10-CM

## 2013-09-02 DIAGNOSIS — G629 Polyneuropathy, unspecified: Secondary | ICD-10-CM

## 2013-09-02 DIAGNOSIS — J3489 Other specified disorders of nose and nasal sinuses: Secondary | ICD-10-CM

## 2013-09-02 DIAGNOSIS — C50912 Malignant neoplasm of unspecified site of left female breast: Secondary | ICD-10-CM

## 2013-09-02 DIAGNOSIS — Z5111 Encounter for antineoplastic chemotherapy: Secondary | ICD-10-CM

## 2013-09-02 DIAGNOSIS — C50112 Malignant neoplasm of central portion of left female breast: Secondary | ICD-10-CM

## 2013-09-02 DIAGNOSIS — C50919 Malignant neoplasm of unspecified site of unspecified female breast: Secondary | ICD-10-CM

## 2013-09-02 LAB — COMPREHENSIVE METABOLIC PANEL (CC13)
ALT: 56 U/L — ABNORMAL HIGH (ref 0–55)
AST: 30 U/L (ref 5–34)
Albumin: 2.9 g/dL — ABNORMAL LOW (ref 3.5–5.0)
BUN: 15 mg/dL (ref 7.0–26.0)
CO2: 30 mEq/L — ABNORMAL HIGH (ref 22–29)
Calcium: 9.5 mg/dL (ref 8.4–10.4)
Chloride: 97 mEq/L — ABNORMAL LOW (ref 98–109)
Creatinine: 0.7 mg/dL (ref 0.6–1.1)
Potassium: 3.7 mEq/L (ref 3.5–5.1)

## 2013-09-02 LAB — CBC WITH DIFFERENTIAL/PLATELET
Basophils Absolute: 0 10*3/uL (ref 0.0–0.1)
Eosinophils Absolute: 0 10*3/uL (ref 0.0–0.5)
HCT: 28.6 % — ABNORMAL LOW (ref 34.8–46.6)
HGB: 9.5 g/dL — ABNORMAL LOW (ref 11.6–15.9)
LYMPH%: 26.2 % (ref 14.0–49.7)
MCH: 30.2 pg (ref 25.1–34.0)
MCV: 90.8 fL (ref 79.5–101.0)
NEUT#: 3.5 10*3/uL (ref 1.5–6.5)
NEUT%: 64.2 % (ref 38.4–76.8)
RDW: 17.7 % — ABNORMAL HIGH (ref 11.2–14.5)
lymph#: 1.4 10*3/uL (ref 0.9–3.3)

## 2013-09-02 MED ORDER — FAMOTIDINE IN NACL 20-0.9 MG/50ML-% IV SOLN
20.0000 mg | Freq: Once | INTRAVENOUS | Status: AC
  Administered 2013-09-02: 20 mg via INTRAVENOUS

## 2013-09-02 MED ORDER — HEPARIN SOD (PORK) LOCK FLUSH 100 UNIT/ML IV SOLN
500.0000 [IU] | Freq: Once | INTRAVENOUS | Status: AC | PRN
  Administered 2013-09-02: 500 [IU]
  Filled 2013-09-02: qty 5

## 2013-09-02 MED ORDER — SODIUM CHLORIDE 0.9 % IV SOLN
130.0000 mg | Freq: Once | INTRAVENOUS | Status: AC
  Administered 2013-09-02: 130 mg via INTRAVENOUS
  Filled 2013-09-02: qty 13

## 2013-09-02 MED ORDER — SODIUM CHLORIDE 0.9 % IV SOLN
Freq: Once | INTRAVENOUS | Status: AC
  Administered 2013-09-02: 13:00:00 via INTRAVENOUS

## 2013-09-02 MED ORDER — SODIUM CHLORIDE 0.9 % IV SOLN
80.0000 mg/m2 | Freq: Once | INTRAVENOUS | Status: AC
  Administered 2013-09-02: 108 mg via INTRAVENOUS
  Filled 2013-09-02: qty 18

## 2013-09-02 MED ORDER — ONDANSETRON 16 MG/50ML IVPB (CHCC)
16.0000 mg | Freq: Once | INTRAVENOUS | Status: AC
  Administered 2013-09-02: 16 mg via INTRAVENOUS

## 2013-09-02 MED ORDER — DIPHENHYDRAMINE HCL 50 MG/ML IJ SOLN
50.0000 mg | Freq: Once | INTRAMUSCULAR | Status: AC
  Administered 2013-09-02: 50 mg via INTRAVENOUS

## 2013-09-02 MED ORDER — DEXAMETHASONE SODIUM PHOSPHATE 20 MG/5ML IJ SOLN
INTRAMUSCULAR | Status: AC
  Filled 2013-09-02: qty 5

## 2013-09-02 MED ORDER — FAMOTIDINE IN NACL 20-0.9 MG/50ML-% IV SOLN
INTRAVENOUS | Status: AC
  Filled 2013-09-02: qty 50

## 2013-09-02 MED ORDER — ONDANSETRON 16 MG/50ML IVPB (CHCC)
INTRAVENOUS | Status: AC
  Filled 2013-09-02: qty 16

## 2013-09-02 MED ORDER — DEXAMETHASONE SODIUM PHOSPHATE 20 MG/5ML IJ SOLN
20.0000 mg | Freq: Once | INTRAMUSCULAR | Status: AC
  Administered 2013-09-02: 20 mg via INTRAVENOUS

## 2013-09-02 MED ORDER — GABAPENTIN 100 MG PO CAPS: 100.0000 mg | ORAL_CAPSULE | Freq: Three times a day (TID) | ORAL | Status: DC

## 2013-09-02 MED ORDER — DIPHENHYDRAMINE HCL 50 MG/ML IJ SOLN
INTRAMUSCULAR | Status: AC
  Filled 2013-09-02: qty 1

## 2013-09-02 MED ORDER — SODIUM CHLORIDE 0.9 % IJ SOLN
10.0000 mL | INTRAMUSCULAR | Status: DC | PRN
  Administered 2013-09-02: 10 mL
  Filled 2013-09-02: qty 10

## 2013-09-02 NOTE — Telephone Encounter (Signed)
, °

## 2013-09-02 NOTE — Patient Instructions (Signed)
[facility] Discharge Instructions for Patients Receiving Chemotherapy  Today you received the following chemotherapy agents:  Taxol & Carboplatin  To help prevent nausea and vomiting after your treatment, we encourage you to take your nausea medication    If you develop nausea and vomiting that is not controlled by your nausea medication, call the clinic.   BELOW ARE SYMPTOMS THAT SHOULD BE REPORTED IMMEDIATELY:  *FEVER GREATER THAN 100.5 F  *CHILLS WITH OR WITHOUT FEVER  NAUSEA AND VOMITING THAT IS NOT CONTROLLED WITH YOUR NAUSEA MEDICATION  *UNUSUAL SHORTNESS OF BREATH  *UNUSUAL BRUISING OR BLEEDING  TENDERNESS IN MOUTH AND THROAT WITH OR WITHOUT PRESENCE OF ULCERS  *URINARY PROBLEMS  *BOWEL PROBLEMS  UNUSUAL RASH Items with * indicate a potential emergency and should be followed up as soon as possible.  Feel free to call the clinic you have any questions or concerns. The clinic phone number is [phone].    

## 2013-09-02 NOTE — Progress Notes (Signed)
[facility]  [phone] [phone]  OFFICE PROGRESS NOTE   ID: Ellen Beltran   DOB: 06-14-1946  MR#: [id number]  ZHY#:[id number]   PCP: [name], MD   DIAGNOSIS: Ellen Beltran is a 67 y.o. female diagnosed in 04/2013 with a clinical stage IIA, estrogen receptor negative, progesterone receptor negative, HER-2/neu neg, invasive ductal carcinoma of the left breast.   PRIOR THERAPY: 1. Patient went for annual screening mammogram in 03/2013 and mammography noted a mass at the 12 o'clock position. Diagnostic mammogram and ultrasound of the left breast was performed on 03/30/2013.  The ultrasound showed a 1.8 cm mass in the 12 o'clock position of the left breast.  Bilateral breast MRI performed on 04/25/2013 showed a 2.4 cm area of concern in the 12 o'clock position of the left breast. There were no abnormal appearing axillary subpectoral or internal mammary lymph nodes.  Left breast needle core biopsy performed on 04/15/2013 revealed invasive carcinoma with papillary features consistent with a ductal phenotype. Tumor grade was intermediate to high grade, estrogen receptor negative, progesterone receptor negative, with a proliferation marker Ki-67 of 47%, HER-2/neu by CISH showed no amplification.  2.  2-D echocardiogram performed on 05/06/2013 showed a left ventricular ejection fraction of 60%.  3. Neoadjuvant chemotherapy consisting of AC (Adriamycin/Cytoxan) began on 05/13/2013 through 06/24/2013 x 4 cycles with Neulasta injection for granulocyte support administered on day 2 of each cycle.    4.  Neoadjuvant chemotherapy consisting of Taxol/Carboplatin x 12 weekly cycles scheduled that started on 07/08/2013.   CURRENT THERAPY: Taxol/Carbo week 9  INTERVAL HISTORY: Ellen Beltran 67 y.o. female returns for evaluation prior to her weekly Taxol/Carbo.   She is doing moderately well today.  She is experiencing numbness intermittently in her  fingertips and occasionally in her toes.  She is already taking Super B complex every day.  She has no motor deficits.  She is fatigued and ready to be finished with chemotherapy.  Otherwise, she denies fevers, chills, nausea, vomiting, constipation, diarrhea.    MEDICAL HISTORY: Past Medical History  Diagnosis Date  . Hypertension   . Hyperlipidemia   . Osteopenia   . Arthritis   . Hot flashes   . Cancer     left breast    ALLERGIES:   Allergies  Allergen Reactions  . Codeine Other (See Comments)    Sees unusual things  . Morphine And Related Nausea Only  . Sulfa Antibiotics Rash    MEDICATIONS:  Current Outpatient Prescriptions  Medication Sig Dispense Refill  . Benfotiamine 150 MG CAPS Take by mouth.      . Biotin 5000 MCG CAPS Take by mouth.      . dexamethasone (DECADRON) 4 MG tablet Take 2 tablets (8 mg total) by mouth 2 (two) times daily. BID x 3 days after chemo  60 tablet  1  . fluticasone (FLONASE) 50 MCG/ACT nasal spray Place 1 spray into both nostrils daily.  16 g  2  . lidocaine-prilocaine (EMLA) cream Apply topically as needed.  30 g  6  . LORazepam (ATIVAN) 0.5 MG tablet Take 1 tablet (0.5 mg total) by mouth every 6 (six) hours as needed (Nausea or vomiting).  30 tablet  0  . Multiple Vitamins-Minerals (ALIVE WOMENS 50+ PO) Take by mouth. Take 2 gummies per day      . ondansetron (ZOFRAN) 8 MG tablet Take 8 mg by mouth every 8 (eight) hours as needed (Take 72 hours after chemotherapy).       Marland Kitchen  PRESCRIPTION MEDICATION Chemotherapy at [facility].      Marland Kitchen prochlorperazine (COMPAZINE) 10 MG tablet Take 10 mg by mouth every 8 (eight) hours as needed.       . simvastatin (ZOCOR) 40 MG tablet Take 40 mg by mouth every evening.      . gabapentin (NEURONTIN) 100 MG capsule Take 1 capsule (100 mg total) by mouth 3 (three) times daily.  90 capsule  0   No current facility-administered medications for this visit.    SURGICAL HISTORY:  Past Surgical History  Procedure  Laterality Date  . Cardiac catheterization  2004  . Tubal ligation  age 14  . Total abdominal hysterectomy  age 29 or 70  . Breast biopsy Left aug 2014  . Portacath placement N/A 05/02/2013    Procedure: INSERTION PORT-A-CATH;  Surgeon: Kandis Cocking, MD;  Location: [facility];  Service: General;  Laterality: N/A;    REVIEW OF SYSTEMS:  A 10 point review of systems was completed and is negative except as noted above.    PHYSICAL EXAMINATION: Blood pressure 150/79, pulse 125, temperature 98.4 F (36.9 C), temperature source Oral, resp. rate 20, height 4\' 10"  (1.473 m), weight 102 lb (46.267 kg). Body mass index is 21.32 kg/(m^2). General: Patient is a well appearing female in no acute distress HEENT: PERRLA, sclerae anicteric no conjunctival pallor, MMM Neck: supple, no palpable adenopathy Lungs: clear to auscultation bilaterally, no wheezes, rhonchi, or rales Cardiovascular: regular rate rhythm, S1, S2, no murmurs, rubs or gallops Abdomen: Soft, non-tender, non-distended, normoactive bowel sounds, no HSM Extremities: warm and well perfused, no clubbing, cyanosis, or edema Skin: No rashes or lesions, macular rash on arms only--stable, patient also has vitiligo Neuro: Non-focal Breasts: unable to palpate left breast mass.  ECOG PERFORMANCE STATUS: 1 - Symptomatic but completely ambulatory  LABORATORY DATA: Lab Results  Component Value Date   WBC 5.4 09/02/2013   HGB 9.5* 09/02/2013   HCT 28.6* 09/02/2013   MCV 90.8 09/02/2013   PLT 210 09/02/2013      Chemistry      Component Value Date/Time   NA 138 09/02/2013 1108   NA 137 05/22/2013 0246   K 3.7 09/02/2013 1108   K 4.2 05/22/2013 0246   CL 108 05/22/2013 0246   CO2 30* 09/02/2013 1108   CO2 23 05/22/2013 0246   BUN 15.0 09/02/2013 1108   BUN 13 05/22/2013 0246   CREATININE 0.7 09/02/2013 1108   CREATININE 0.62 05/22/2013 0246      Component Value Date/Time   CALCIUM 9.5 09/02/2013 1108   CALCIUM 8.6 05/22/2013 0246   ALKPHOS 69  09/02/2013 1108   ALKPHOS 75 05/19/2013 1856   AST 30 09/02/2013 1108   AST 8 05/19/2013 1856   ALT 56* 09/02/2013 1108   ALT 11 05/19/2013 1856   BILITOT 0.45 09/02/2013 1108   BILITOT 0.7 05/19/2013 1856       RADIOGRAPHIC STUDIES: No results found.  ASSESSMENT: Ellen Beltran is a 67 y.o. female with:  #1 Screening mammogram in 03/2013 noted a mass at the 12 o'clock position.  Diagnostic mammogram and ultrasound of the left breast was performed on 03/30/2013.  The ultrasound showed a 1.8 cm mass in the 12 o'clock position of the left breast. Bilateral breast MRI  performed on 04/25/2013 showed a 2.4 cm area of concern in the 12 o'clock position of the left breast. There were no abnormal appearing axillary subpectoral or internal mammary lymph nodes.  Left breast needle core  biopsy performed on 04/15/2013 revealed invasive carcinoma with papillary features consistent with a ductal phenotype. Tumor grade was intermediate to high grade, estrogen receptor negative, progesterone receptor negative, with a proliferation marker Ki-67 of 47%, HER-2/neu by CISH showed no amplification.  #2  2-D echocardiogram performed on 05/06/2013 showed a left ventricular ejection fraction of 60%.  #3 Neoadjuvant chemotherapy consisting of AC (Adriamycin/Cytoxan) began on 05/13/2013 through 06/24/2013 x 4 cycles with Neulasta injection for granulocyte support administered on day 2 of each cycle.    #4  Neoadjuvant chemotherapy consisting of Taxol/Carboplatin x 12 weekly cycles started on 07/08/2013.   PLAN:   #1 Ms. Boyte is doing well.  Her CBC is stable, I reviewed it with her in detail.  She will proceed with chemotherapy today.   #2 Her rash is stable.  She will continue apply Aquaphor or Cetaphil to her skin.  She has a dermatology appointment in January.  She will let us know if it worsens, but it has remained stable for 4 weeks now.    #3  She will return in 1 week, for labs, evaluation, and  chemotherapy.    #4 I ordered a CT sinuses since she continues to have difficulty with nasal drainage.  This will happen later this week.    #5 She has early peripheral neuropathy.  She will start Gabapentin 100mg  TID. I discussed this medication with her in detail, and gave her detailed information in her AVS.    All questions answered. Mr. and Mrs. Crossman were encouraged to contact us in the interim with any questions, concerns, or problems.  I spent 25 minutes counseling the patient face to face.  The total time spent in the appointment was 30 minutes.  Illa Level, NP [facility] [phone] 09/04/2013    8:21 AM

## 2013-09-02 NOTE — Telephone Encounter (Signed)
Per staff message and POF I have scheduled appts.  JMW  

## 2013-09-02 NOTE — Patient Instructions (Signed)
Gabapentin capsules or tablets What is this medicine? GABAPENTIN (GA ba pen tin) is used to control partial seizures in adults with epilepsy. It is also used to treat certain types of nerve pain. This medicine may be used for other purposes; ask your health care provider or pharmacist if you have questions. COMMON BRAND NAME(S): Ascencion Dike , Neurontin What should I tell my health care provider before I take this medicine? They need to know if you have any of these conditions: -kidney disease -suicidal thoughts, plans, or attempt; a previous suicide attempt by you or a family member -an unusual or allergic reaction to gabapentin, other medicines, foods, dyes, or preservatives -pregnant or trying to get pregnant -breast-feeding How should I use this medicine? Take this medicine by mouth. Swallow it with a drink of water. Follow the directions on the prescription label. If this medicine upsets your stomach, take it with food or milk. Take your medicine at regular intervals. Do not take it more often than directed. If you are directed to break the 600 or 800 mg tablets in half as part of your dose, the extra half tablet should be used for the next dose. If you have not used the extra half tablet within 3 days, it should be thrown away. A special MedGuide will be given to you by the pharmacist with each prescription and refill. Be sure to read this information carefully each time. Talk to your pediatrician regarding the use of this medicine in children. Special care may be needed. Overdosage: If you think you have taken too much of this medicine contact a poison control center or emergency room at once. NOTE: This medicine is only for you. Do not share this medicine with others. What if I miss a dose? If you miss a dose, take it as soon as you can. If it is almost time for your next dose, take only that dose. Do not take double or extra doses. What may interact with this medicine? Do not take this  medicine with any of the following medications: -other gabapentin products This medicine may also interact with the following medications: -alcohol -antacids -antihistamines for allergy, cough and cold -certain medicines for anxiety or sleep -certain medicines for depression or psychotic disturbances -homatropine; hydrocodone -naproxen -narcotic medicines (opiates) for pain -phenothiazines like chlorpromazine, mesoridazine, prochlorperazine, thioridazine This list may not describe all possible interactions. Give your health care provider a list of all the medicines, herbs, non-prescription drugs, or dietary supplements you use. Also tell them if you smoke, drink alcohol, or use illegal drugs. Some items may interact with your medicine. What should I watch for while using this medicine? Visit your doctor or health care professional for regular checks on your progress. You may want to keep a record at home of how you feel your condition is responding to treatment. You may want to share this information with your doctor or health care professional at each visit. You should contact your doctor or health care professional if your seizures get worse or if you have any new types of seizures. Do not stop taking this medicine or any of your seizure medicines unless instructed by your doctor or health care professional. Stopping your medicine suddenly can increase your seizures or their severity. Wear a medical identification bracelet or chain if you are taking this medicine for seizures, and carry a card that lists all your medications. You may get drowsy, dizzy, or have blurred vision. Do not drive, use machinery, or do anything that  needs mental alertness until you know how this medicine affects you. To reduce dizzy or fainting spells, do not sit or stand up quickly, especially if you are an older patient. Alcohol can increase drowsiness and dizziness. Avoid alcoholic drinks. Your mouth may get dry.  Chewing sugarless gum or sucking hard candy, and drinking plenty of water will help. The use of this medicine may increase the chance of suicidal thoughts or actions. Pay special attention to how you are responding while on this medicine. Any worsening of mood, or thoughts of suicide or dying should be reported to your health care professional right away. Women who become pregnant while using this medicine may enroll in the [place] American Antiepileptic Drug Pregnancy Registry by calling ([phone]. This registry collects information about the safety of antiepileptic drug use during pregnancy. What side effects may I notice from receiving this medicine? Side effects that you should report to your doctor or health care professional as soon as possible: -allergic reactions like skin rash, itching or hives, swelling of the face, lips, or tongue -worsening of mood, thoughts or actions of suicide or dying Side effects that usually do not require medical attention (report to your doctor or health care professional if they continue or are bothersome): -constipation -difficulty walking or controlling muscle movements -dizziness -nausea -slurred speech -tiredness -tremors -weight gain This list may not describe all possible side effects. Call your doctor for medical advice about side effects. You may report side effects to FDA at 1-800-FDA-1088. Where should I keep my medicine? Keep out of reach of children. Store at room temperature between 15 and 30 degrees C (59 and 86 degrees F). Throw away any unused medicine after the expiration date. NOTE: This sheet is a summary. It may not cover all possible information. If you have questions about this medicine, talk to your doctor, pharmacist, or health care provider.  2014, [employer] Standard. (2011-09-24 11:43:23)

## 2013-09-05 ENCOUNTER — Encounter (HOSPITAL_COMMUNITY): Payer: Self-pay

## 2013-09-05 ENCOUNTER — Ambulatory Visit (HOSPITAL_COMMUNITY)
Admission: RE | Admit: 2013-09-05 | Discharge: 2013-09-05 | Disposition: A | Discharge status: Home / Self Care | Payer: Medicare Other | Source: Ambulatory Visit | Attending: Adult Health | Admitting: Adult Health

## 2013-09-05 DIAGNOSIS — C50919 Malignant neoplasm of unspecified site of unspecified female breast: Secondary | ICD-10-CM | POA: Insufficient documentation

## 2013-09-05 DIAGNOSIS — C50112 Malignant neoplasm of central portion of left female breast: Secondary | ICD-10-CM

## 2013-09-05 DIAGNOSIS — R220 Localized swelling, mass and lump, head: Secondary | ICD-10-CM | POA: Insufficient documentation

## 2013-09-05 DIAGNOSIS — R040 Epistaxis: Secondary | ICD-10-CM | POA: Insufficient documentation

## 2013-09-05 DIAGNOSIS — J3489 Other specified disorders of nose and nasal sinuses: Secondary | ICD-10-CM | POA: Insufficient documentation

## 2013-09-05 MED ORDER — IOHEXOL 300 MG/ML  SOLN
100.0000 mL | Freq: Once | INTRAMUSCULAR | Status: AC | PRN
  Administered 2013-09-05: 100 mL via INTRAVENOUS

## 2013-09-09 ENCOUNTER — Telehealth: Payer: Self-pay | Admitting: *Deleted

## 2013-09-09 ENCOUNTER — Ambulatory Visit (HOSPITAL_COMMUNITY)
Admission: RE | Admit: 2013-09-09 | Discharge: 2013-09-09 | Disposition: A | Discharge status: Home / Self Care | Payer: Medicare Other | Source: Ambulatory Visit | Attending: Adult Health | Admitting: Adult Health

## 2013-09-09 ENCOUNTER — Other Ambulatory Visit (HOSPITAL_BASED_OUTPATIENT_CLINIC_OR_DEPARTMENT_OTHER): Payer: Medicare Other

## 2013-09-09 ENCOUNTER — Ambulatory Visit (HOSPITAL_BASED_OUTPATIENT_CLINIC_OR_DEPARTMENT_OTHER): Payer: Medicare Other | Admitting: Adult Health

## 2013-09-09 ENCOUNTER — Encounter (HOSPITAL_COMMUNITY): Payer: Self-pay

## 2013-09-09 ENCOUNTER — Ambulatory Visit: Payer: Medicare Other

## 2013-09-09 ENCOUNTER — Encounter: Payer: Self-pay | Admitting: Adult Health

## 2013-09-09 VITALS — BP 149/69 | HR 139 | Temp 98.9°F | Resp 19 | Ht <= 58 in | Wt 102.2 lb

## 2013-09-09 DIAGNOSIS — C50912 Malignant neoplasm of unspecified site of left female breast: Secondary | ICD-10-CM

## 2013-09-09 DIAGNOSIS — R000 Tachycardia, unspecified: Secondary | ICD-10-CM

## 2013-09-09 DIAGNOSIS — C50919 Malignant neoplasm of unspecified site of unspecified female breast: Secondary | ICD-10-CM

## 2013-09-09 DIAGNOSIS — C50112 Malignant neoplasm of central portion of left female breast: Secondary | ICD-10-CM

## 2013-09-09 DIAGNOSIS — R0789 Other chest pain: Secondary | ICD-10-CM

## 2013-09-09 DIAGNOSIS — M4120 Other idiopathic scoliosis, site unspecified: Secondary | ICD-10-CM | POA: Insufficient documentation

## 2013-09-09 DIAGNOSIS — R0602 Shortness of breath: Secondary | ICD-10-CM

## 2013-09-09 DIAGNOSIS — Z171 Estrogen receptor negative status [ER-]: Secondary | ICD-10-CM

## 2013-09-09 DIAGNOSIS — D649 Anemia, unspecified: Secondary | ICD-10-CM

## 2013-09-09 DIAGNOSIS — G5690 Unspecified mononeuropathy of unspecified upper limb: Secondary | ICD-10-CM

## 2013-09-09 LAB — CBC WITH DIFFERENTIAL/PLATELET
BASO%: 0.3 % (ref 0.0–2.0)
Eosinophils Absolute: 0 10*3/uL (ref 0.0–0.5)
HGB: 9.1 g/dL — ABNORMAL LOW (ref 11.6–15.9)
LYMPH%: 23.9 % (ref 14.0–49.7)
MCHC: 32.9 g/dL (ref 31.5–36.0)
MCV: 91.7 fL (ref 79.5–101.0)
MONO%: 9.8 % (ref 0.0–14.0)
NEUT#: 4.1 10*3/uL (ref 1.5–6.5)
NEUT%: 65.8 % (ref 38.4–76.8)
Platelets: 195 10*3/uL (ref 145–400)
RBC: 3.02 10*6/uL — ABNORMAL LOW (ref 3.70–5.45)

## 2013-09-09 LAB — COMPREHENSIVE METABOLIC PANEL (CC13)
AST: 22 U/L (ref 5–34)
Alkaline Phosphatase: 73 U/L (ref 40–150)
BUN: 12.4 mg/dL (ref 7.0–26.0)
Creatinine: 0.7 mg/dL (ref 0.6–1.1)
Glucose: 135 mg/dl (ref 70–140)
Sodium: 136 mEq/L (ref 136–145)
Total Bilirubin: 0.48 mg/dL (ref 0.20–1.20)
Total Protein: 6.1 g/dL — ABNORMAL LOW (ref 6.4–8.3)

## 2013-09-09 MED ORDER — IOHEXOL 350 MG/ML SOLN
100.0000 mL | Freq: Once | INTRAVENOUS | Status: AC | PRN
  Administered 2013-09-09: 100 mL via INTRAVENOUS

## 2013-09-09 NOTE — Telephone Encounter (Signed)
Pt went straight over for CT...td

## 2013-09-09 NOTE — Progress Notes (Addendum)
[facility]  [phone] [phone]  OFFICE PROGRESS NOTE   ID: ORENE ABBASI   DOB: 1945-12-07  MR#: [id number]  JYN#:[id number]   PCP: [name], MD   DIAGNOSIS: MICHIE MOLNAR is a 67 y.o. female diagnosed in 04/2013 with a clinical stage IIA, estrogen receptor negative, progesterone receptor negative, HER-2/neu neg, invasive ductal carcinoma of the left breast.   PRIOR THERAPY: 1. Patient went for annual screening mammogram in 03/2013 and mammography noted a mass at the 12 o'clock position. Diagnostic mammogram and ultrasound of the left breast was performed on 03/30/2013.  The ultrasound showed a 1.8 cm mass in the 12 o'clock position of the left breast.  Bilateral breast MRI performed on 04/25/2013 showed a 2.4 cm area of concern in the 12 o'clock position of the left breast. There were no abnormal appearing axillary subpectoral or internal mammary lymph nodes.  Left breast needle core biopsy performed on 04/15/2013 revealed invasive carcinoma with papillary features consistent with a ductal phenotype. Tumor grade was intermediate to high grade, estrogen receptor negative, progesterone receptor negative, with a proliferation marker Ki-67 of 47%, HER-2/neu by CISH showed no amplification.  2.  2-D echocardiogram performed on 05/06/2013 showed a left ventricular ejection fraction of 60%.  3. Neoadjuvant chemotherapy consisting of AC (Adriamycin/Cytoxan) began on 05/13/2013 through 06/24/2013 x 4 cycles with Neulasta injection for granulocyte support administered on day 2 of each cycle.    4.  Neoadjuvant chemotherapy consisting of Taxol/Carboplatin x 12 weekly cycles scheduled that started on 07/08/2013.   CURRENT THERAPY: Taxol/Carbo week 10  INTERVAL HISTORY: DESARIE FEILD 67 y.o. female returns for evaluation prior to her weekly Taxol/Carbo.   She continues to have neuropathy in the very tips of her fingers and toes.  She is taking  Super B complex daily, and Gabapentin 100mg  TID for this.  She is short of breath today.  She can hardly continue to talk without getting out of breath.  This occurs also with activity and her activities of daily living.   She is also c/o chest tightness.  She denies fevers, chills, pain with inspiration, any radiation of the tightness to any other parts of her body, cough, congestion, or any further concerns.    MEDICAL HISTORY: Past Medical History  Diagnosis Date  . Hypertension   . Hyperlipidemia   . Osteopenia   . Arthritis   . Hot flashes   . Cancer     left breast    ALLERGIES:   Allergies  Allergen Reactions  . Codeine Other (See Comments)    Sees unusual things  . Morphine And Related Nausea Only  . Sulfa Antibiotics Rash    MEDICATIONS:  Current Outpatient Prescriptions  Medication Sig Dispense Refill  . Benfotiamine 150 MG CAPS Take by mouth.      . Biotin 5000 MCG CAPS Take by mouth.      . dexamethasone (DECADRON) 4 MG tablet Take 2 tablets (8 mg total) by mouth 2 (two) times daily. BID x 3 days after chemo  60 tablet  1  . fluticasone (FLONASE) 50 MCG/ACT nasal spray Place 1 spray into both nostrils daily.  16 g  2  . gabapentin (NEURONTIN) 100 MG capsule Take 1 capsule (100 mg total) by mouth 3 (three) times daily.  90 capsule  0  . lidocaine-prilocaine (EMLA) cream Apply topically as needed.  30 g  6  . LORazepam (ATIVAN) 0.5 MG tablet Take 1 tablet (0.5 mg total)  by mouth every 6 (six) hours as needed (Nausea or vomiting).  30 tablet  0  . Multiple Vitamins-Minerals (ALIVE WOMENS 50+ PO) Take by mouth. Take 2 gummies per day      . ondansetron (ZOFRAN) 8 MG tablet Take 8 mg by mouth every 8 (eight) hours as needed (Take 72 hours after chemotherapy).       Marland Kitchen PRESCRIPTION MEDICATION Chemotherapy at [facility].      Marland Kitchen prochlorperazine (COMPAZINE) 10 MG tablet Take 10 mg by mouth every 8 (eight) hours as needed.       . simvastatin (ZOCOR) 40 MG tablet Take 40 mg by mouth  every evening.       No current facility-administered medications for this visit.    SURGICAL HISTORY:  Past Surgical History  Procedure Laterality Date  . Cardiac catheterization  2004  . Tubal ligation  age 67  . Total abdominal hysterectomy  age 6 or 45  . Breast biopsy Left aug 2014  . Portacath placement N/A 05/02/2013    Procedure: INSERTION PORT-A-CATH;  Surgeon: Kandis Cocking, MD;  Location: [facility];  Service: General;  Laterality: N/A;    REVIEW OF SYSTEMS:  A 10 point review of systems was completed and is negative except as noted above.    PHYSICAL EXAMINATION: Blood pressure 149/69, pulse 139, temperature 98.9 F (37.2 C), temperature source Oral, resp. rate 19, height 4\' 10"  (1.473 m), weight 102 lb 3.2 oz (46.358 kg). Body mass index is 21.37 kg/(m^2). General: Patient is a well appearing female in no acute distress HEENT: PERRLA, sclerae anicteric no conjunctival pallor, MMM Neck: supple, no palpable adenopathy Lungs: clear to auscultation bilaterally, no wheezes, rhonchi, or rales Cardiovascular: slightly tachycardic, regular rhythm, S1, S2, no murmurs, rubs or gallops Abdomen: Soft, non-tender, non-distended, normoactive bowel sounds, no HSM Extremities: warm and well perfused, no clubbing, cyanosis, or edema Skin: No rashes or lesions Neuro: Non-focal Breasts: unable to palpate left breast mass.  ECOG PERFORMANCE STATUS: 1 - Symptomatic but completely ambulatory  LABORATORY DATA: Lab Results  Component Value Date   WBC 6.2 09/09/2013   HGB 9.1* 09/09/2013   HCT 27.7* 09/09/2013   MCV 91.7 09/09/2013   PLT 195 09/09/2013      Chemistry      Component Value Date/Time   NA 136 09/09/2013 1006   NA 137 05/22/2013 0246   K 3.9 09/09/2013 1006   K 4.2 05/22/2013 0246   CL 108 05/22/2013 0246   CO2 27 09/09/2013 1006   CO2 23 05/22/2013 0246   BUN 12.4 09/09/2013 1006   BUN 13 05/22/2013 0246   CREATININE 0.7 09/09/2013 1006   CREATININE 0.62 05/22/2013 0246       Component Value Date/Time   CALCIUM 9.5 09/09/2013 1006   CALCIUM 8.6 05/22/2013 0246   ALKPHOS 73 09/09/2013 1006   ALKPHOS 75 05/19/2013 1856   AST 22 09/09/2013 1006   AST 8 05/19/2013 1856   ALT 43 09/09/2013 1006   ALT 11 05/19/2013 1856   BILITOT 0.48 09/09/2013 1006   BILITOT 0.7 05/19/2013 1856       RADIOGRAPHIC STUDIES: No results found.  ASSESSMENT: QUNISHA BRYK is a 67 y.o. female with:  #1 Screening mammogram in 03/2013 noted a mass at the 12 o'clock position.  Diagnostic mammogram and ultrasound of the left breast was performed on 03/30/2013.  The ultrasound showed a 1.8 cm mass in the 12 o'clock position of the left breast. Bilateral breast MRI  performed on 04/25/2013 showed a 2.4 cm area of concern in the 12 o'clock position of the left breast. There were no abnormal appearing axillary subpectoral or internal mammary lymph nodes.  Left breast needle core biopsy performed on 04/15/2013 revealed invasive carcinoma with papillary features consistent with a ductal phenotype. Tumor grade was intermediate to high grade, estrogen receptor negative, progesterone receptor negative, with a proliferation marker Ki-67 of 47%, HER-2/neu by CISH showed no amplification.  #2  2-D echocardiogram performed on 05/06/2013 showed a left ventricular ejection fraction of 60%.  #3 Neoadjuvant chemotherapy consisting of AC (Adriamycin/Cytoxan) began on 05/13/2013 through 06/24/2013 x 4 cycles with Neulasta injection for granulocyte support administered on day 2 of each cycle.    #4  Neoadjuvant chemotherapy consisting of Taxol/Carboplatin x 12 weekly cycles started on 07/08/2013.   PLAN:   #1 Ms. Riling is doing well.  Her CBC is stable, I reviewed it with her in detail.  She is mildly anemic.  We will monitor this.   Due to her symptoms, after discussion with Dr. Darrold Span, she will not receive chemotherapy today.  We will re-evaluate her next week.    #2 Due to the shortness of breath,  tachycardia, and tightness in her chest, I ordered a CTA chest stat.  We did an EKG in the office that demonstrated sinus tachycardia at 114.  She did improve with her breathlessness after applying oxygen, two liters nasal cannula on.  CTA did end up being normal.  Upon arrival back into the exam room to discuss her results, she stated she felt much better and was ready to go ahead and get her chemotherapy.  I told her that giving her chemotherapy on top of her previous symptoms was too risky.  I instructed her that should her symptoms return, she should go to the emergency room.   #3 We will see her back next week for labs, evaluation, and potentially week 10 of Taxol/Carbo.    All questions answered. Mr. and Mrs. Cumpton were encouraged to contact us in the interim with any questions, concerns, or problems.  I spent 40 minutes counseling the patient face to face.  The total time spent in the appointment was 60 minutes.  Illa Level, NP Medical Oncology [facility] [phone] 09/09/2013    4:07 PM  Addendum:  (Covering Dr Welton Flakes) Patient discussed with NP and seen/ examined by this MD, with husband and another family member accompanying, prior to sending for urgent angio CT chest. History that I obtained is of SOB x past few days, most noticeable with exertion such as walking in office tho she also notices some SOB and general chest tightness at rest now. She denies chest pain, cough, fever or other symptoms of infection; she had some slight discomfort behind right knee without swelling, that better now. No noted bleeding. Husband feels she is "frustrated" by ongoing chemo.  PMH notable also for HTN and elevated lipids, with cardiac cath 2004 by Dr Yates Decamp due to strong family history of premature CAD in father (died with massive MI age 34) and 2 brothers with bypasses in 11s. That cath had mild AR and normal coronary artery findings with EF 70-80%. She is not a smoker. On  exam she appears somewhat dyspneic at rest and seems anxious, otherwise NAD. Respirations 22-24, HR 140 regular, afebrile, 149/69 which is in her usual range. Lungs are entirely clear to A and P, no use of accessory muscles, no JVD. Heart tachy,  regular, clear heart sounds. No swelling LE, no cords or tenderness right including patellar fossa area or LLE. PAC site ok.  EKG reviewed, sinus tach without acute findings Labs reviewed: hgb slightly lower at 9.1, having been 10.3 in early Dec.  Offered patient evaluation at ED vs CT angio chest now, with possible ED if no findings. She preferred not to go thru ED. MD and NP spoke directly with financial staff re CT angio chest urgent now.  Results of that CT reviewed. Patient seen following scan by NP, with patient reporting resolution of symptoms and requesting treatment. Given findings earlier, it seems more prudent to hold chemo today and reevaluate including repeat CBC prior to treatment next week. Patient is instructed to go to ED if symptoms recur or worsen.   Ila Mcgill, MD

## 2013-09-10 ENCOUNTER — Other Ambulatory Visit: Payer: Self-pay | Admitting: Oncology

## 2013-09-14 ENCOUNTER — Telehealth: Payer: Self-pay | Admitting: *Deleted

## 2013-09-14 NOTE — Telephone Encounter (Signed)
09/13/13 TEMPERATURE WAS 100.2. PT. TOOK TYLENOL AND TEMPERATURE RETURNED TO NORMAL. TODAY TEMPERATURE HAS BEEN 100.1 AND 100.8. DIGITALLY. PT.'S HUSBAND ALSO TOOK PT.'S TEMPERATURE MANUALLY WHICH WAS 101.6. PT.'S HEAD IS STILL "STOPPED UP". THE NASAL DRAINAGE AND WHAT SHE BRINGS UP FROM HER THROAT IS CLEAR WITH SOME RED STREAKS. NO COUGH AND BREATHING IS NORMAL. THIS NOTE TO DR.KHAN'S NURSE, MEREDITH WALTON,RN.

## 2013-09-16 ENCOUNTER — Encounter: Payer: Self-pay | Admitting: Oncology

## 2013-09-16 ENCOUNTER — Ambulatory Visit (HOSPITAL_BASED_OUTPATIENT_CLINIC_OR_DEPARTMENT_OTHER): Payer: Medicare Other

## 2013-09-16 ENCOUNTER — Other Ambulatory Visit: Payer: Medicare Other

## 2013-09-16 ENCOUNTER — Ambulatory Visit (HOSPITAL_BASED_OUTPATIENT_CLINIC_OR_DEPARTMENT_OTHER): Payer: Medicare Other | Admitting: Oncology

## 2013-09-16 ENCOUNTER — Ambulatory Visit (HOSPITAL_COMMUNITY)
Admission: RE | Admit: 2013-09-16 | Discharge: 2013-09-16 | Disposition: A | Discharge status: Home / Self Care | Payer: Medicare Other | Source: Ambulatory Visit | Attending: Oncology | Admitting: Oncology

## 2013-09-16 ENCOUNTER — Other Ambulatory Visit (HOSPITAL_BASED_OUTPATIENT_CLINIC_OR_DEPARTMENT_OTHER): Payer: Medicare Other

## 2013-09-16 VITALS — BP 108/69 | HR 101 | Temp 98.4°F | Resp 18

## 2013-09-16 VITALS — BP 116/73 | HR 134 | Temp 100.3°F | Resp 19 | Ht <= 58 in | Wt 103.4 lb

## 2013-09-16 DIAGNOSIS — Z171 Estrogen receptor negative status [ER-]: Secondary | ICD-10-CM

## 2013-09-16 DIAGNOSIS — C50919 Malignant neoplasm of unspecified site of unspecified female breast: Secondary | ICD-10-CM

## 2013-09-16 DIAGNOSIS — J019 Acute sinusitis, unspecified: Secondary | ICD-10-CM

## 2013-09-16 DIAGNOSIS — D6481 Anemia due to antineoplastic chemotherapy: Secondary | ICD-10-CM | POA: Insufficient documentation

## 2013-09-16 DIAGNOSIS — T451X5A Adverse effect of antineoplastic and immunosuppressive drugs, initial encounter: Secondary | ICD-10-CM | POA: Insufficient documentation

## 2013-09-16 DIAGNOSIS — D649 Anemia, unspecified: Secondary | ICD-10-CM

## 2013-09-16 DIAGNOSIS — C50119 Malignant neoplasm of central portion of unspecified female breast: Secondary | ICD-10-CM | POA: Insufficient documentation

## 2013-09-16 DIAGNOSIS — C50912 Malignant neoplasm of unspecified site of left female breast: Secondary | ICD-10-CM

## 2013-09-16 DIAGNOSIS — I10 Essential (primary) hypertension: Secondary | ICD-10-CM

## 2013-09-16 DIAGNOSIS — R509 Fever, unspecified: Secondary | ICD-10-CM

## 2013-09-16 LAB — CBC WITH DIFFERENTIAL/PLATELET
BASO%: 0.6 % (ref 0.0–2.0)
BASOS ABS: 0.1 10*3/uL (ref 0.0–0.1)
EOS%: 0.5 % (ref 0.0–7.0)
Eosinophils Absolute: 0 10*3/uL (ref 0.0–0.5)
HCT: 22.8 % — ABNORMAL LOW (ref 34.8–46.6)
HGB: 7.6 g/dL — ABNORMAL LOW (ref 11.6–15.9)
LYMPH%: 21.6 % (ref 14.0–49.7)
MCH: 30.6 pg (ref 25.1–34.0)
MCHC: 33.4 g/dL (ref 31.5–36.0)
MCV: 91.4 fL (ref 79.5–101.0)
MONO#: 0.9 10*3/uL (ref 0.1–0.9)
MONO%: 10.2 % (ref 0.0–14.0)
NEUT#: 5.9 10*3/uL (ref 1.5–6.5)
NEUT%: 67.1 % (ref 38.4–76.8)
Platelets: 248 10*3/uL (ref 145–400)
RBC: 2.49 10*6/uL — AB (ref 3.70–5.45)
RDW: 18.8 % — AB (ref 11.2–14.5)
WBC: 8.9 10*3/uL (ref 3.9–10.3)
lymph#: 1.9 10*3/uL (ref 0.9–3.3)

## 2013-09-16 LAB — COMPREHENSIVE METABOLIC PANEL (CC13)
ALBUMIN: 2.5 g/dL — AB (ref 3.5–5.0)
ALK PHOS: 67 U/L (ref 40–150)
ALT: 49 U/L (ref 0–55)
AST: 25 U/L (ref 5–34)
Anion Gap: 14 mEq/L — ABNORMAL HIGH (ref 3–11)
BUN: 11.8 mg/dL (ref 7.0–26.0)
CO2: 25 mEq/L (ref 22–29)
Calcium: 9.6 mg/dL (ref 8.4–10.4)
Chloride: 99 mEq/L (ref 98–109)
Creatinine: 0.7 mg/dL (ref 0.6–1.1)
Glucose: 252 mg/dl — ABNORMAL HIGH (ref 70–140)
POTASSIUM: 3.9 meq/L (ref 3.5–5.1)
SODIUM: 138 meq/L (ref 136–145)
Total Bilirubin: 0.38 mg/dL (ref 0.20–1.20)
Total Protein: 6.2 g/dL — ABNORMAL LOW (ref 6.4–8.3)

## 2013-09-16 LAB — FERRITIN CHCC

## 2013-09-16 LAB — PREPARE RBC (CROSSMATCH)

## 2013-09-16 LAB — IRON AND TIBC CHCC
%SAT: 6 % — ABNORMAL LOW (ref 21–57)
IRON: 13 ug/dL — AB (ref 41–142)
TIBC: 198 ug/dL — AB (ref 236–444)
UIBC: 185 ug/dL (ref 120–384)

## 2013-09-16 MED ORDER — ACETAMINOPHEN 325 MG PO TABS
650.0000 mg | ORAL_TABLET | Freq: Once | ORAL | Status: AC
  Administered 2013-09-16: 650 mg via ORAL

## 2013-09-16 MED ORDER — AMOXICILLIN 875 MG PO TABS: ORAL_TABLET | ORAL | Status: DC

## 2013-09-16 MED ORDER — ACETAMINOPHEN 325 MG PO TABS
ORAL_TABLET | ORAL | Status: AC
  Filled 2013-09-16: qty 2

## 2013-09-16 MED ORDER — SODIUM CHLORIDE 0.9 % IJ SOLN
10.0000 mL | INTRAMUSCULAR | Status: AC | PRN
  Administered 2013-09-16: 10 mL
  Filled 2013-09-16: qty 10

## 2013-09-16 MED ORDER — HEPARIN SOD (PORK) LOCK FLUSH 100 UNIT/ML IV SOLN
500.0000 [IU] | Freq: Every day | INTRAVENOUS | Status: AC | PRN
  Administered 2013-09-16: 500 [IU]
  Filled 2013-09-16: qty 5

## 2013-09-16 MED ORDER — SODIUM CHLORIDE 0.9 % IV SOLN
250.0000 mL | Freq: Once | INTRAVENOUS | Status: AC
  Administered 2013-09-16: 250 mL via INTRAVENOUS

## 2013-09-16 NOTE — Patient Instructions (Signed)
Blood Transfusion Information WHAT IS A BLOOD TRANSFUSION? A transfusion is the replacement of blood or some of its parts. Blood is made up of multiple cells which provide different functions.  Red blood cells carry oxygen and are used for blood loss replacement.  White blood cells fight against infection.  Platelets control bleeding.  Plasma helps clot blood.  Other blood products are available for specialized needs, such as hemophilia or other clotting disorders. BEFORE THE TRANSFUSION  Who gives blood for transfusions?   You may be able to donate blood to be used at a later date on yourself (autologous donation).  Relatives can be asked to donate blood. This is generally not any safer than if you have received blood from a stranger. The same precautions are taken to ensure safety when a relative's blood is donated.  Healthy volunteers who are fully evaluated to make sure their blood is safe. This is blood bank blood. Transfusion therapy is the safest it has ever been in the practice of medicine. Before blood is taken from a donor, a complete history is taken to make sure that person has no history of diseases nor engages in risky social behavior (examples are intravenous drug use or sexual activity with multiple partners). The donor's travel history is screened to minimize risk of transmitting infections, such as malaria. The donated blood is tested for signs of infectious diseases, such as HIV and hepatitis. The blood is then tested to be sure it is compatible with you in order to minimize the chance of a transfusion reaction. If you or a relative donates blood, this is often done in anticipation of surgery and is not appropriate for emergency situations. It takes many days to process the donated blood. RISKS AND COMPLICATIONS Although transfusion therapy is very safe and saves many lives, the main dangers of transfusion include:   Getting an infectious disease.  Developing a  transfusion reaction. This is an allergic reaction to something in the blood you were given. Every precaution is taken to prevent this. The decision to have a blood transfusion has been considered carefully by your caregiver before blood is given. Blood is not given unless the benefits outweigh the risks. AFTER THE TRANSFUSION  Right after receiving a blood transfusion, you will usually feel much better and more energetic. This is especially true if your red blood cells have gotten low (anemic). The transfusion raises the level of the red blood cells which carry oxygen, and this usually causes an energy increase.  The nurse administering the transfusion will monitor you carefully for complications. HOME CARE INSTRUCTIONS  No special instructions are needed after a transfusion. You may find your energy is better. Speak with your caregiver about any limitations on activity for underlying diseases you may have. SEEK MEDICAL CARE IF:   Your condition is not improving after your transfusion.  You develop redness or irritation at the intravenous (IV) site. SEEK IMMEDIATE MEDICAL CARE IF:  Any of the following symptoms occur over the next 12 hours:  Shaking chills.  You have a temperature by mouth above 102 F (38.9 C), not controlled by medicine.  Chest, back, or muscle pain.  People around you feel you are not acting correctly or are confused.  Shortness of breath or difficulty breathing.  Dizziness and fainting.  You get a rash or develop hives.  You have a decrease in urine output.  Your urine turns a dark color or changes to pink, red, or brown. Any of the following   symptoms occur over the next 10 days:  You have a temperature by mouth above 102 F (38.9 C), not controlled by medicine.  Shortness of breath.  Weakness after normal activity.  The white part of the eye turns yellow (jaundice).  You have a decrease in the amount of urine or are urinating less often.  Your  urine turns a dark color or changes to pink, red, or brown. Document Released: 08/29/2000 Document Revised: 11/24/2011 Document Reviewed: 04/17/2008 [employer] Patient Information 2014 [employer].  

## 2013-09-16 NOTE — Patient Instructions (Signed)
We will send prescription for amoxicillin clavulanate (Augmentin) to [facility] for acute sinusitis. You need to take this WITH FOOD every 12 hours BY THE CLOCK -- can cause diarrhea if you take it on empty stomach or doses closer together than every 12 hours by the clock. Use saline spray every hour while awake Would not use the Flonase now -- that will not help the infection.  Push fluids. Mucinex [phone] mg twice daily is always good if secretions are thick  Call if shaking chills, temp staying >=101 or if flu symptoms such as aching   [phone]

## 2013-09-16 NOTE — Progress Notes (Signed)
OFFICE PROGRESS NOTE   09/16/2013   Physicians: K.Khan, D.Newman, Kathryne Eriksson (PCP)  INTERVAL HISTORY:  Patient is seen, together with husband, in Dr Laurelyn Sickle absence, in follow up of problems in past week including respiratory symptoms 12-26 (CT angio chest negative, weekly taxol held) and now intermittent fevers since 09-14-13 when she was 101.6, and as high as 102.4 on 09-15-13;  hemoglobin is also down to 7.6 today. She has had no more chest tightness, does notice pressure at lower sternum when she leans forward, no chest pain or cough. She has had no shaking chills with the fever, has sinus congestion with pressure especially left maxillary, and purulent yellow nasal drainage yesterday as well as decreased hearing/ no ear pain. She has had no aches, no sore throat, no other flu symptoms, no discomfort at [facility], no bladder symptoms. She has intermittent small amounts of blood or clots from nares, no other bleeding. Bowels are moving normally, not black. She is generally tired, tho actually feels some better today than at visit 09-09-13. She has seasonal allergic sinusitis, not usually in winter. She has been using claritin and flonase daily, with occasional saline nose spray.  She does not use oral decadron premedication for the weekly taxol.    ONCOLOGIC HISTORY Clinical IIA triple negative invasive ductal carcinoma left breast, diagnosed 04-2013, neoadjuvant chemotherapy in process 1. Patient went for annual screening mammogram in 03/2013 and mammography noted a mass at the 12 o'clock position. Diagnostic mammogram and ultrasound of the left breast was performed on 03/30/2013. The ultrasound showed a 1.8 cm mass in the 12 o'clock position of the left breast. Bilateral breast MRI performed on 04/25/2013 showed a 2.4 cm area of concern in the 12 o'clock position of the left breast. There were no abnormal appearing axillary subpectoral or internal mammary lymph nodes. Left breast needle core biopsy  performed on 04/15/2013 revealed invasive carcinoma with papillary features consistent with a ductal phenotype. Tumor grade was intermediate to high grade, estrogen receptor negative, progesterone receptor negative, with a proliferation marker Ki-67 of 47%, HER-2/neu by CISH showed no amplification.  2. 2-D echocardiogram performed on 05/06/2013 showed a left ventricular ejection fraction of 60%.  3. Neoadjuvant chemotherapy consisting of AC (Adriamycin/Cytoxan) began on 05/13/2013 through 06/24/2013 x 4 cycles with Neulasta injection for granulocyte support administered on day 2 of each cycle.  4. Neoadjuvant chemotherapy consisting of Taxol/Carboplatin x 12 weekly cycles scheduled that started on 07/08/2013.  Week 10/12 taxol held 12-26 due to respiratory problems. Week 10/12 taxol held again 09-16-13 due to fever up to 102 in past 24 hours, apparent acute sinusitis.   Review of systems as above, also: Appetite good, no nausea or vomiting. Slept some last pm. Has not been exposed to anyone ill at home Remainder of 10 point Review of Systems negative.  Objective:  Vital signs in last 24 hours:  BP 116/73  Pulse 134  Temp(Src) 100.3 F (37.9 C) (Oral)  Resp 19  Ht '4\' 10"'  (1.473 m)  Wt 103 lb 7 oz (46.919 kg)  BMI 21.62 kg/m2 Weight is up one lb from last week. Alert, oriented and appropriate, sounds nasally congested, sniffling. Ambulatory without difficulty. Looks only mildly ill, better than at my visit last week. Alopecia  HEENT:PERRL, sclerae not icteric. Oral mucosa moist without lesions, posterior pharynx with minimal erythema, no exudate. Mucous membranes somewhat pale.Left TM dull, not bulging or red. Right TM poor light reflex but better than left. Nasal septum bilaterally raw tho not actively  bleeding, no purulent drainage seen, turbinates boggy. Neck supple. No JVD.  Lymphatics:no cervical,suraclavicular adenopathy Resp: clear to auscultation bilaterally and normal percussion  bilaterally Cardio: regular rate and rhythm. No gallop. GI: soft, nontender, not distended, no mass or organomegaly. Normally active bowel sounds.  Musculoskeletal/ Extremities: without pitting edema, cords, tenderness Neuro: CN, motor, sensory nonfocal Skin without rash, ecchymosis, petechiae. Portacath-without erythema or tenderness  Lab Results:  Results for orders placed in visit on 09/16/13  CBC WITH DIFFERENTIAL      Result Value Range   WBC 8.9  3.9 - 10.3 10e3/uL   NEUT# 5.9  1.5 - 6.5 10e3/uL   HGB 7.6 (*) 11.6 - 15.9 g/dL   HCT 22.8 (*) 34.8 - 46.6 %   Platelets 248  145 - 400 10e3/uL   MCV 91.4  79.5 - 101.0 fL   MCH 30.6  25.1 - 34.0 pg   MCHC 33.4  31.5 - 36.0 g/dL   RBC 2.49 (*) 3.70 - 5.45 10e6/uL   RDW 18.8 (*) 11.2 - 14.5 %   lymph# 1.9  0.9 - 3.3 10e3/uL   MONO# 0.9  0.1 - 0.9 10e3/uL   Eosinophils Absolute 0.0  0.0 - 0.5 10e3/uL   Basophils Absolute 0.1  0.0 - 0.1 10e3/uL   NEUT% 67.1  38.4 - 76.8 %   LYMPH% 21.6  14.0 - 49.7 %   MONO% 10.2  0.0 - 14.0 %   EOS% 0.5  0.0 - 7.0 %   BASO% 0.6  0.0 - 2.0 %  COMPREHENSIVE METABOLIC PANEL (GG26)      Result Value Range   Sodium 138  136 - 145 mEq/L   Potassium 3.9  3.5 - 5.1 mEq/L   Chloride 99  98 - 109 mEq/L   CO2 25  22 - 29 mEq/L   Glucose 252 (*) 70 - 140 mg/dl   BUN 11.8  7.0 - 26.0 mg/dL   Creatinine 0.7  0.6 - 1.1 mg/dL   Total Bilirubin 0.38  0.20 - 1.20 mg/dL   Alkaline Phosphatase 67  40 - 150 U/L   AST 25  5 - 34 U/L   ALT 49  0 - 55 U/L   Total Protein 6.2 (*) 6.4 - 8.3 g/dL   Albumin 2.5 (*) 3.5 - 5.0 g/dL   Calcium 9.6  8.4 - 10.4 mg/dL   Anion Gap 14 (*) 3 - 11 mEq/L    Blood culture x1 from [facility] now. T&C x 1 unit PRBCs for transfusion today Iron, IBC, ferritin to be drawn today.    Studies/Results:  Maxillofacial CT 09-05-13 reviewed, paranasal sinuses clear then  Medications: I have reviewed the patient's current medications. Will start augmentin 875 mg every 12 hours with food  x 7 d, these instructions reviewed given orally by MD and written. She will hold flonase while on antibiotics; she can continue claritin if this helps, otherwise hold if just excessively drying nose. She will use saline nose spray every hour while awake.   DISCUSSION: I have explained to patient and husband that we cannot give chemotherapy today with fever, but hopefully this will resolve with interventions as above to allow chemo to resume next week. She has appointment with Dr Alphonsa Overall 09-30-13, which is probably still ok timing. She is instructed to call if temps stay >= 101 or shaking chills. We have discussed drop in hemoglobin today, likely primarily from all of chemo to date. She was transfused 2 units PRBCs on 07-02-13  for Hgb 7.4. Per patient, she has "always been anemic" tho hgb 12.4 in 04-2013 and no CBCs prior to that available to me now. Iron studies to be sent. Patient and husband agree to transfusion 1 unit PRBCs today.  Assessment/Plan: 1. Clinical IIA triple negative left breast cancer: neoadjuvant chemotherapy in process, tho #10 weekly taxol held last week and today. She will see APP with lab and treatment next week. Chemo dates adjusted. 2.Fevers x 48 hours without shaking chills, likely acute sinusitis. Augmentin and other interventions as above. 3. PAC in 4.no PE by CT angio chest 09-09-13, for symptoms of chest tightness and SOB 5.progressive anemia: one unit PRBCs today for Hgb 7.6 now, check iron studies. Blood orders in. 6. HTN, elevated lipids: good EF prior to adriamycin 7. Post TAH    Patient and husband followed conversation well, understood instructions and were in agreement with plan. They will call prior to next scheduled visit if needed. Time spent 25 min including >50% counseling and coordination of care.   LIVESAY,LENNIS P, MD   09/16/2013, 1:26 PM

## 2013-09-17 LAB — TYPE AND SCREEN
ABO/RH(D): O POS
ANTIBODY SCREEN: NEGATIVE
UNIT DIVISION: 0

## 2013-09-20 ENCOUNTER — Other Ambulatory Visit: Payer: Self-pay | Admitting: Emergency Medicine

## 2013-09-20 ENCOUNTER — Telehealth: Payer: Self-pay | Admitting: *Deleted

## 2013-09-20 DIAGNOSIS — C50119 Malignant neoplasm of central portion of unspecified female breast: Secondary | ICD-10-CM

## 2013-09-21 ENCOUNTER — Ambulatory Visit (HOSPITAL_COMMUNITY)
Admission: RE | Admit: 2013-09-21 | Discharge: 2013-09-21 | Disposition: A | Discharge status: Home / Self Care | Payer: Medicare Other | Source: Ambulatory Visit | Attending: Adult Health | Admitting: Adult Health

## 2013-09-21 ENCOUNTER — Other Ambulatory Visit: Payer: Self-pay | Admitting: Emergency Medicine

## 2013-09-21 ENCOUNTER — Other Ambulatory Visit (HOSPITAL_BASED_OUTPATIENT_CLINIC_OR_DEPARTMENT_OTHER): Payer: Medicare Other

## 2013-09-21 DIAGNOSIS — C50119 Malignant neoplasm of central portion of unspecified female breast: Secondary | ICD-10-CM

## 2013-09-21 DIAGNOSIS — D709 Neutropenia, unspecified: Secondary | ICD-10-CM

## 2013-09-21 DIAGNOSIS — R509 Fever, unspecified: Secondary | ICD-10-CM | POA: Insufficient documentation

## 2013-09-21 DIAGNOSIS — R5081 Fever presenting with conditions classified elsewhere: Secondary | ICD-10-CM

## 2013-09-21 LAB — CBC WITH DIFFERENTIAL/PLATELET
BASO%: 0.8 % (ref 0.0–2.0)
BASOS ABS: 0.1 10*3/uL (ref 0.0–0.1)
EOS%: 2.3 % (ref 0.0–7.0)
Eosinophils Absolute: 0.3 10*3/uL (ref 0.0–0.5)
HEMATOCRIT: 27 % — AB (ref 34.8–46.6)
HEMOGLOBIN: 9.1 g/dL — AB (ref 11.6–15.9)
LYMPH%: 27.7 % (ref 14.0–49.7)
MCH: 30.1 pg (ref 25.1–34.0)
MCHC: 33.8 g/dL (ref 31.5–36.0)
MCV: 89.2 fL (ref 79.5–101.0)
MONO#: 1.3 10*3/uL — ABNORMAL HIGH (ref 0.1–0.9)
MONO%: 11.7 % (ref 0.0–14.0)
NEUT#: 6.5 10*3/uL (ref 1.5–6.5)
NEUT%: 57.5 % (ref 38.4–76.8)
PLATELETS: 344 10*3/uL (ref 145–400)
RBC: 3.03 10*6/uL — ABNORMAL LOW (ref 3.70–5.45)
RDW: 17 % — ABNORMAL HIGH (ref 11.2–14.5)
WBC: 11.4 10*3/uL — ABNORMAL HIGH (ref 3.9–10.3)
lymph#: 3.1 10*3/uL (ref 0.9–3.3)

## 2013-09-21 LAB — COMPREHENSIVE METABOLIC PANEL (CC13)
ALT: 137 U/L — AB (ref 0–55)
ANION GAP: 11 meq/L (ref 3–11)
AST: 73 U/L — ABNORMAL HIGH (ref 5–34)
Albumin: 2.3 g/dL — ABNORMAL LOW (ref 3.5–5.0)
Alkaline Phosphatase: 87 U/L (ref 40–150)
BILIRUBIN TOTAL: 0.26 mg/dL (ref 0.20–1.20)
BUN: 10.8 mg/dL (ref 7.0–26.0)
CALCIUM: 9.7 mg/dL (ref 8.4–10.4)
CO2: 28 meq/L (ref 22–29)
CREATININE: 0.7 mg/dL (ref 0.6–1.1)
Chloride: 99 mEq/L (ref 98–109)
Glucose: 172 mg/dl — ABNORMAL HIGH (ref 70–140)
Potassium: 3.4 mEq/L — ABNORMAL LOW (ref 3.5–5.1)
Sodium: 139 mEq/L (ref 136–145)
Total Protein: 6.9 g/dL (ref 6.4–8.3)

## 2013-09-21 LAB — URINALYSIS, MICROSCOPIC - CHCC
Bilirubin (Urine): NEGATIVE
Blood: NEGATIVE
GLUCOSE UR CHCC: NEGATIVE mg/dL
KETONES: NEGATIVE mg/dL
Leukocyte Esterase: NEGATIVE
Nitrite: NEGATIVE
PH: 6 (ref 4.6–8.0)
Protein: NEGATIVE mg/dL
RBC / HPF: NEGATIVE (ref 0–2)
Specific Gravity, Urine: 1.005 (ref 1.003–1.035)
Urobilinogen, UR: 0.2 mg/dL (ref 0.2–1)

## 2013-09-22 ENCOUNTER — Other Ambulatory Visit: Payer: Self-pay | Admitting: Nurse Practitioner

## 2013-09-22 LAB — CULTURE, BLOOD (SINGLE)

## 2013-09-22 LAB — URINE CULTURE

## 2013-09-22 MED ORDER — POTASSIUM CHLORIDE CRYS ER 20 MEQ PO TBCR: 20.0000 meq | EXTENDED_RELEASE_TABLET | Freq: Once | ORAL | Status: DC

## 2013-09-23 ENCOUNTER — Ambulatory Visit (HOSPITAL_BASED_OUTPATIENT_CLINIC_OR_DEPARTMENT_OTHER): Payer: Medicare Other | Admitting: Adult Health

## 2013-09-23 ENCOUNTER — Ambulatory Visit: Payer: Medicare Other

## 2013-09-23 ENCOUNTER — Other Ambulatory Visit (HOSPITAL_BASED_OUTPATIENT_CLINIC_OR_DEPARTMENT_OTHER): Payer: Medicare Other

## 2013-09-23 ENCOUNTER — Inpatient Hospital Stay (HOSPITAL_COMMUNITY)
Admission: EM | Admit: 2013-09-23 | Discharge: 2013-09-28 | DRG: 866 | Disposition: A | Discharge status: Home Health | Payer: Medicare Other | Attending: Internal Medicine | Admitting: Internal Medicine

## 2013-09-23 ENCOUNTER — Other Ambulatory Visit: Payer: Self-pay

## 2013-09-23 ENCOUNTER — Encounter: Payer: Self-pay | Admitting: Adult Health

## 2013-09-23 ENCOUNTER — Encounter (HOSPITAL_COMMUNITY): Payer: Self-pay | Admitting: Emergency Medicine

## 2013-09-23 ENCOUNTER — Inpatient Hospital Stay (HOSPITAL_COMMUNITY): Payer: Medicare Other

## 2013-09-23 ENCOUNTER — Inpatient Hospital Stay: Admission: AD | Admit: 2013-09-23 | Payer: Self-pay | Source: Ambulatory Visit | Admitting: Internal Medicine

## 2013-09-23 VITALS — BP 118/74 | HR 132 | Temp 99.0°F | Resp 18 | Ht <= 58 in | Wt 105.5 lb

## 2013-09-23 DIAGNOSIS — R7402 Elevation of levels of lactic acid dehydrogenase (LDH): Secondary | ICD-10-CM

## 2013-09-23 DIAGNOSIS — Z801 Family history of malignant neoplasm of trachea, bronchus and lung: Secondary | ICD-10-CM

## 2013-09-23 DIAGNOSIS — D72829 Elevated white blood cell count, unspecified: Secondary | ICD-10-CM

## 2013-09-23 DIAGNOSIS — R509 Fever, unspecified: Secondary | ICD-10-CM | POA: Diagnosis present

## 2013-09-23 DIAGNOSIS — C50912 Malignant neoplasm of unspecified site of left female breast: Secondary | ICD-10-CM

## 2013-09-23 DIAGNOSIS — IMO0002 Reserved for concepts with insufficient information to code with codable children: Secondary | ICD-10-CM

## 2013-09-23 DIAGNOSIS — D649 Anemia, unspecified: Secondary | ICD-10-CM

## 2013-09-23 DIAGNOSIS — C50112 Malignant neoplasm of central portion of left female breast: Secondary | ICD-10-CM

## 2013-09-23 DIAGNOSIS — T451X5A Adverse effect of antineoplastic and immunosuppressive drugs, initial encounter: Secondary | ICD-10-CM | POA: Diagnosis present

## 2013-09-23 DIAGNOSIS — C50919 Malignant neoplasm of unspecified site of unspecified female breast: Secondary | ICD-10-CM | POA: Diagnosis present

## 2013-09-23 DIAGNOSIS — D630 Anemia in neoplastic disease: Secondary | ICD-10-CM | POA: Diagnosis present

## 2013-09-23 DIAGNOSIS — E7800 Pure hypercholesterolemia, unspecified: Secondary | ICD-10-CM | POA: Diagnosis present

## 2013-09-23 DIAGNOSIS — D6481 Anemia due to antineoplastic chemotherapy: Secondary | ICD-10-CM | POA: Diagnosis present

## 2013-09-23 DIAGNOSIS — R7401 Elevation of levels of liver transaminase levels: Secondary | ICD-10-CM

## 2013-09-23 DIAGNOSIS — Z8249 Family history of ischemic heart disease and other diseases of the circulatory system: Secondary | ICD-10-CM

## 2013-09-23 DIAGNOSIS — B9789 Other viral agents as the cause of diseases classified elsewhere: Principal | ICD-10-CM | POA: Diagnosis present

## 2013-09-23 DIAGNOSIS — E785 Hyperlipidemia, unspecified: Secondary | ICD-10-CM | POA: Diagnosis present

## 2013-09-23 DIAGNOSIS — D638 Anemia in other chronic diseases classified elsewhere: Secondary | ICD-10-CM | POA: Diagnosis present

## 2013-09-23 DIAGNOSIS — I10 Essential (primary) hypertension: Secondary | ICD-10-CM | POA: Diagnosis present

## 2013-09-23 DIAGNOSIS — C50119 Malignant neoplasm of central portion of unspecified female breast: Secondary | ICD-10-CM

## 2013-09-23 DIAGNOSIS — R740 Nonspecific elevation of levels of transaminase and lactic acid dehydrogenase [LDH]: Secondary | ICD-10-CM

## 2013-09-23 DIAGNOSIS — Z853 Personal history of malignant neoplasm of breast: Secondary | ICD-10-CM | POA: Diagnosis present

## 2013-09-23 LAB — CBC WITH DIFFERENTIAL/PLATELET
BASO%: 1.3 % (ref 0.0–2.0)
Basophils Absolute: 0.2 10*3/uL — ABNORMAL HIGH (ref 0.0–0.1)
Basophils Absolute: 0.1 10*3/uL (ref 0.0–0.1)
Basophils Relative: 1 % (ref 0–1)
EOS%: 3 % (ref 0.0–7.0)
Eosinophils Absolute: 0.4 10*3/uL (ref 0.0–0.5)
Eosinophils Absolute: 0.3 10*3/uL (ref 0.0–0.7)
Eosinophils Relative: 3 % (ref 0–5)
HCT: 25.3 % — ABNORMAL LOW (ref 34.8–46.6)
HCT: 23.6 % — ABNORMAL LOW (ref 36.0–46.0)
HEMOGLOBIN: 7.7 g/dL — AB (ref 12.0–15.0)
HGB: 8.4 g/dL — ABNORMAL LOW (ref 11.6–15.9)
LYMPH%: 22.6 % (ref 14.0–49.7)
LYMPHS ABS: 3.5 10*3/uL (ref 0.7–4.0)
Lymphocytes Relative: 31 % (ref 12–46)
MCH: 29.5 pg (ref 25.1–34.0)
MCH: 29.2 pg (ref 26.0–34.0)
MCHC: 33 g/dL (ref 31.5–36.0)
MCHC: 32.6 g/dL (ref 30.0–36.0)
MCV: 89.2 fL (ref 79.5–101.0)
MCV: 89.4 fL (ref 78.0–100.0)
MONO#: 1.3 10*3/uL — ABNORMAL HIGH (ref 0.1–0.9)
MONO%: 9.3 % (ref 0.0–14.0)
MONOS PCT: 11 % (ref 3–12)
Monocytes Absolute: 1.2 10*3/uL — ABNORMAL HIGH (ref 0.1–1.0)
NEUT%: 63.8 % (ref 38.4–76.8)
NEUTROS ABS: 8.7 10*3/uL — AB (ref 1.5–6.5)
NEUTROS PCT: 55 % (ref 43–77)
Neutro Abs: 6.1 10*3/uL (ref 1.7–7.7)
PLATELETS: 394 10*3/uL (ref 145–400)
Platelets: 368 10*3/uL (ref 150–400)
RBC: 2.84 10*6/uL — AB (ref 3.70–5.45)
RBC: 2.64 MIL/uL — AB (ref 3.87–5.11)
RDW: 17.2 % — AB (ref 11.2–14.5)
RDW: 16.7 % — ABNORMAL HIGH (ref 11.5–15.5)
WBC: 13.6 10*3/uL — ABNORMAL HIGH (ref 3.9–10.3)
WBC: 11.1 10*3/uL — AB (ref 4.0–10.5)
lymph#: 3.1 10*3/uL (ref 0.9–3.3)

## 2013-09-23 LAB — COMPREHENSIVE METABOLIC PANEL
ALBUMIN: 2.2 g/dL — AB (ref 3.5–5.2)
ALK PHOS: 73 U/L (ref 39–117)
ALT: 121 U/L — ABNORMAL HIGH (ref 0–35)
AST: 68 U/L — AB (ref 0–37)
BILIRUBIN TOTAL: 0.3 mg/dL (ref 0.3–1.2)
BUN: 10 mg/dL (ref 6–23)
CHLORIDE: 95 meq/L — AB (ref 96–112)
CO2: 26 mEq/L (ref 19–32)
Calcium: 8.9 mg/dL (ref 8.4–10.5)
Creatinine, Ser: 0.46 mg/dL — ABNORMAL LOW (ref 0.50–1.10)
GFR calc Af Amer: >90 mL/min (ref >90)
GFR calc non Af Amer: >90 mL/min (ref >90)
Glucose, Bld: 95 mg/dL (ref 70–99)
Potassium: 3.5 mEq/L — ABNORMAL LOW (ref 3.7–5.3)
SODIUM: 137 meq/L (ref 137–147)
Total Protein: 6.4 g/dL (ref 6.0–8.3)

## 2013-09-23 LAB — PROTIME-INR
INR: 1.05 (ref 0.00–1.49)
Prothrombin Time: 13.5 seconds (ref 11.6–15.2)

## 2013-09-23 LAB — COMPREHENSIVE METABOLIC PANEL (CC13)
ALK PHOS: 76 U/L (ref 40–150)
ALT: 123 U/L — AB (ref 0–55)
AST: 56 U/L — AB (ref 5–34)
Albumin: 2.1 g/dL — ABNORMAL LOW (ref 3.5–5.0)
Anion Gap: 12 mEq/L — ABNORMAL HIGH (ref 3–11)
BILIRUBIN TOTAL: 0.28 mg/dL (ref 0.20–1.20)
BUN: 11.1 mg/dL (ref 7.0–26.0)
CO2: 26 mEq/L (ref 22–29)
Calcium: 9.7 mg/dL (ref 8.4–10.4)
Chloride: 99 mEq/L (ref 98–109)
Creatinine: 0.7 mg/dL (ref 0.6–1.1)
GLUCOSE: 172 mg/dL — AB (ref 70–140)
POTASSIUM: 3.8 meq/L (ref 3.5–5.1)
Sodium: 136 mEq/L (ref 136–145)
Total Protein: 6.5 g/dL (ref 6.4–8.3)

## 2013-09-23 LAB — URINALYSIS, ROUTINE W REFLEX MICROSCOPIC
Bilirubin Urine: NEGATIVE
Glucose, UA: NEGATIVE mg/dL
HGB URINE DIPSTICK: NEGATIVE
Ketones, ur: NEGATIVE mg/dL
LEUKOCYTES UA: NEGATIVE
Nitrite: NEGATIVE
PH: 5.5 (ref 5.0–8.0)
Protein, ur: NEGATIVE mg/dL
Specific Gravity, Urine: 1.014 (ref 1.005–1.030)
Urobilinogen, UA: 0.2 mg/dL (ref 0.0–1.0)

## 2013-09-23 LAB — APTT: aPTT: 29 seconds (ref 24–37)

## 2013-09-23 LAB — STREP PNEUMONIAE URINARY ANTIGEN: Strep Pneumo Urinary Antigen: NEGATIVE

## 2013-09-23 LAB — EXPECTORATED SPUTUM ASSESSMENT W GRAM STAIN, RFLX TO RESP C

## 2013-09-23 LAB — PHOSPHORUS: Phosphorus: 3.7 mg/dL (ref 2.3–4.6)

## 2013-09-23 LAB — EXPECTORATED SPUTUM ASSESSMENT W REFEX TO RESP CULTURE

## 2013-09-23 LAB — TSH: TSH: 1.007 u[IU]/mL (ref 0.350–4.500)

## 2013-09-23 LAB — INFLUENZA PANEL BY PCR (TYPE A & B)
H1N1 flu by pcr: NOT DETECTED
INFLBPCR: NEGATIVE
Influenza A By PCR: NEGATIVE

## 2013-09-23 LAB — MAGNESIUM: Magnesium: 1.9 mg/dL (ref 1.5–2.5)

## 2013-09-23 MED ORDER — ACETAMINOPHEN 325 MG PO TABS
650.0000 mg | ORAL_TABLET | Freq: Four times a day (QID) | ORAL | Status: DC | PRN
  Administered 2013-09-23 – 2013-09-24 (×3): 650 mg via ORAL
  Filled 2013-09-23 (×3): qty 2

## 2013-09-23 MED ORDER — ACETAMINOPHEN 650 MG RE SUPP: 650.0000 mg | Freq: Four times a day (QID) | RECTAL | Status: DC | PRN

## 2013-09-23 MED ORDER — VANCOMYCIN HCL IN DEXTROSE 1-5 GM/200ML-% IV SOLN
1000.0000 mg | INTRAVENOUS | Status: AC
  Administered 2013-09-23: 1000 mg via INTRAVENOUS
  Filled 2013-09-23: qty 200

## 2013-09-23 MED ORDER — SIMVASTATIN 40 MG PO TABS
40.0000 mg | ORAL_TABLET | Freq: Every evening | ORAL | Status: DC
  Administered 2013-09-24 – 2013-09-27 (×4): 40 mg via ORAL
  Filled 2013-09-23 (×6): qty 1

## 2013-09-23 MED ORDER — PIPERACILLIN-TAZOBACTAM 3.375 G IVPB
3.3750 g | Freq: Three times a day (TID) | INTRAVENOUS | Status: DC
  Administered 2013-09-23 – 2013-09-26 (×8): 3.375 g via INTRAVENOUS
  Filled 2013-09-23 (×10): qty 50

## 2013-09-23 MED ORDER — ONDANSETRON HCL 4 MG PO TABS: 4.0000 mg | ORAL_TABLET | Freq: Four times a day (QID) | ORAL | Status: DC | PRN

## 2013-09-23 MED ORDER — VANCOMYCIN HCL 500 MG IV SOLR
500.0000 mg | Freq: Two times a day (BID) | INTRAVENOUS | Status: DC
  Administered 2013-09-24 – 2013-09-25 (×3): 500 mg via INTRAVENOUS
  Filled 2013-09-23 (×4): qty 500

## 2013-09-23 MED ORDER — SODIUM CHLORIDE 0.9 % IV SOLN
INTRAVENOUS | Status: DC
  Administered 2013-09-23: 15:00:00 via INTRAVENOUS
  Administered 2013-09-24: 1000 mL via INTRAVENOUS
  Administered 2013-09-26 – 2013-09-28 (×5): via INTRAVENOUS

## 2013-09-23 MED ORDER — GABAPENTIN 100 MG PO CAPS
100.0000 mg | ORAL_CAPSULE | Freq: Three times a day (TID) | ORAL | Status: DC
  Administered 2013-09-23 – 2013-09-28 (×14): 100 mg via ORAL
  Filled 2013-09-23 (×17): qty 1

## 2013-09-23 MED ORDER — PROCHLORPERAZINE MALEATE 10 MG PO TABS: 10.0000 mg | ORAL_TABLET | Freq: Three times a day (TID) | ORAL | Status: DC | PRN

## 2013-09-23 MED ORDER — BENFOTIAMINE 150 MG PO CAPS: 150.0000 mg | ORAL_CAPSULE | Freq: Every day | ORAL | Status: DC

## 2013-09-23 MED ORDER — ONDANSETRON HCL 4 MG/2ML IJ SOLN: 4.0000 mg | Freq: Four times a day (QID) | INTRAMUSCULAR | Status: DC | PRN

## 2013-09-23 MED ORDER — PIPERACILLIN-TAZOBACTAM 3.375 G IVPB 30 MIN
3.3750 g | INTRAVENOUS | Status: AC
  Administered 2013-09-23: 3.375 g via INTRAVENOUS
  Filled 2013-09-23: qty 50

## 2013-09-23 NOTE — Progress Notes (Signed)
ANTIBIOTIC CONSULT NOTE - INITIAL  Pharmacy Consult for Vanc/Zosyn Indication: rule out sepsis  Allergies  Allergen Reactions  . Codeine Other (See Comments)    Sees unusual things  . Morphine And Related Nausea Only  . Sulfa Antibiotics Rash    Patient Measurements:   Adjusted Body Weight:  Wt at cancer center 1/9: 47.9kg  Vital Signs: Temp: 99 F (37.2 C) (01/09 1228) Temp src: Oral (01/09 1228) BP: 134/70 mmHg (01/09 1228) Pulse Rate: 106 (01/09 1228) Intake/Output from previous day:   Intake/Output from this shift:    Labs:  Recent Labs  09/21/13 1101 09/23/13 0940 09/23/13 0941  WBC 11.4* 13.6*  --   HGB 9.1* 8.4*  --   PLT 344 394  --   CREATININE 0.7  --  0.7   The CrCl is unknown because both a height and weight (above a minimum accepted value) are required for this calculation. No results found for this basename: VANCOTROUGH, Corlis Leak, VANCORANDOM, GENTTROUGH, GENTPEAK, GENTRANDOM, TOBRATROUGH, TOBRAPEAK, TOBRARND, AMIKACINPEAK, AMIKACINTROU, AMIKACIN,  in the last 72 hours   Microbiology: Recent Results (from the past 720 hour(s))  TECHNOLOGIST REVIEW     Status: None   Collection Time    08/26/13  9:57 AM      Result Value Range Status   Technologist Review Variant lymphs present, Metas and myelocytes   Final  TECHNOLOGIST REVIEW     Status: None   Collection Time    09/02/13 11:08 AM      Result Value Range Status   Technologist Review     Final   Value: Variant lymphs, rare meta and myelo, 2% nrbcs present  CULTURE, BLOOD (SINGLE)     Status: None   Collection Time    09/16/13 10:10 AM      Result Value Range Status   Blood Culture, Routine Culture, Blood   Final   Comment: Final - ===== FINAL REPORT =====NO GROWTH 5 DAYS  URINE CULTURE     Status: None   Collection Time    09/21/13 11:01 AM      Result Value Range Status   Urine Culture, Routine Culture, Urine   Final   Comment: Final - ===== COLONY COUNT: =====     NO GROWTH     NO  GROWTH    Medical History: Past Medical History  Diagnosis Date  . Hypertension   . Hyperlipidemia   . Osteopenia   . Arthritis   . Hot flashes   . Cancer     left breast    Medications:   (Not in a hospital admission) Assessment: 68 yo F with stage IIA carcinoma of left breast, presents with 10 day hx of fever off/on. Dx with acute sinusitis 1 wk ago, tx with amoxicillin. Urine culture, UA, CXR 1/7 neg. Admitted for further work up, to start broad spectrum abx.  Tmax: 99 WBC: 13.6 Renal: SCr 0.7, 89 N   Goals of Therapy:   Vancomycin trough level 15-20 mcg/ml  Eradicate Infection  Appropriate antibiotic coverage  Plan:  1. Vanc 1g IV in the ED, then 500mg  IV q8h 2. Zosyn 3.375g IV in the ED, then Zosyn EI 3. F/U cultures, clinical course  Saesha Llerenas PharmD Candidate 09/23/2013,1:46 PM

## 2013-09-23 NOTE — Progress Notes (Signed)
PHARMACIST - PHYSICIAN ORDER COMMUNICATION  CONCERNING: P&T Medication Policy on Herbal Medications  DESCRIPTION:  This patient's order for:  Benfotiamine  has been noted.  This product(s) is classified as an "herbal" or natural product. Due to a lack of definitive safety studies or FDA approval, nonstandard manufacturing practices, plus the potential risk of unknown drug-drug interactions while on inpatient medications, the Pharmacy and Therapeutics Committee does not permit the use of "herbal" or natural products of this type within [facility].   ACTION TAKEN: The pharmacy department is unable to verify this order at this time and your patient has been informed of this safety policy. Please reevaluate patient's clinical condition at discharge and address if the herbal or natural product(s) should be resumed at that time.  Thank you, Minda Ditto PharmD Pager [phone] 09/23/2013, 4:23 PM

## 2013-09-23 NOTE — Progress Notes (Signed)
ANTIBIOTIC CONSULT NOTE - INITIAL  Pharmacy Consult for Vanc/Zosyn Indication: rule out sepsis  Allergies  Allergen Reactions  . Codeine Other (See Comments)    Sees unusual things  . Morphine And Related Nausea Only  . Sulfa Antibiotics Rash    Patient Measurements: Height: 4\' 10"  (147.3 cm) Weight: 107 lb 9.6 oz (48.807 kg) IBW/kg (Calculated) : 40.9 Adjusted Body Weight:  Wt at cancer center 1/9: 47.9kg  Vital Signs: Temp: 99.2 F (37.3 C) (01/09 1609) Temp src: Oral (01/09 1609) BP: 121/56 mmHg (01/09 1609) Pulse Rate: 121 (01/09 1609) Intake/Output from previous day:   Intake/Output from this shift:    Labs:  Recent Labs  09/21/13 1101 09/23/13 0940 09/23/13 0941 09/23/13 1345  WBC 11.4* 13.6*  --  11.1*  HGB 9.1* 8.4*  --  7.7*  PLT 344 394  --  368  CREATININE 0.7  --  0.7 0.46*   Estimated Creatinine Clearance: 44.7 ml/min (by C-G formula based on Cr of 0.46). No results found for this basename: VANCOTROUGH, Corlis Leak, VANCORANDOM, GENTTROUGH, GENTPEAK, GENTRANDOM, TOBRATROUGH, TOBRAPEAK, TOBRARND, AMIKACINPEAK, AMIKACINTROU, AMIKACIN,  in the last 72 hours   Microbiology: Recent Results (from the past 720 hour(s))  TECHNOLOGIST REVIEW     Status: None   Collection Time    08/26/13  9:57 AM      Result Value Range Status   Technologist Review Variant lymphs present, Metas and myelocytes   Final  TECHNOLOGIST REVIEW     Status: None   Collection Time    09/02/13 11:08 AM      Result Value Range Status   Technologist Review     Final   Value: Variant lymphs, rare meta and myelo, 2% nrbcs present  CULTURE, BLOOD (SINGLE)     Status: None   Collection Time    09/16/13 10:10 AM      Result Value Range Status   Blood Culture, Routine Culture, Blood   Final   Comment: Final - ===== FINAL REPORT =====NO GROWTH 5 DAYS  URINE CULTURE     Status: None   Collection Time    09/21/13 11:01 AM      Result Value Range Status   Urine Culture, Routine  Culture, Urine   Final   Comment: Final - ===== COLONY COUNT: =====     NO GROWTH     NO GROWTH    Medical History: Past Medical History  Diagnosis Date  . Hypertension   . Hyperlipidemia   . Osteopenia   . Arthritis   . Hot flashes   . Cancer     left breast    Medications:  Prescriptions prior to admission  Medication Sig Dispense Refill  . Benfotiamine 150 MG CAPS Take 150 mg by mouth daily.       . Biotin 5000 MCG CAPS Take by mouth.      . dexamethasone (DECADRON) 4 MG tablet Take 2 tablets (8 mg total) by mouth 2 (two) times daily. BID x 3 days after chemo  60 tablet  1  . gabapentin (NEURONTIN) 100 MG capsule Take 1 capsule (100 mg total) by mouth 3 (three) times daily.  90 capsule  0  . lidocaine-prilocaine (EMLA) cream Apply topically as needed.  30 g  6  . Multiple Vitamins-Minerals (ALIVE WOMENS 50+ PO) Take 2 each by mouth daily. Take 2 gummies per day      . potassium chloride SA (K-DUR,KLOR-CON) 20 MEQ tablet Take 1 tablet (20 mEq total) by  mouth once.  3 tablet  0  . PRESCRIPTION MEDICATION Chemotherapy at [facility].      Marland Kitchen prochlorperazine (COMPAZINE) 10 MG tablet Take 10 mg by mouth every 8 (eight) hours as needed.       . simvastatin (ZOCOR) 40 MG tablet Take 40 mg by mouth every evening.       Assessment: 68 yo F with stage IIA carcinoma of left breast on chemotherapy (held for last 2 weeks), presents with 10 day history of FUO. Patient has been on course of amoxicillin 1/2-1/9 for recent diagnosis of sinusitis by oncologist.  Work up from [facility] including blood (1/2) culture, urine (1/7) culture, and CXR (1/7)  all negative.  Pt direct admit from [place] to [place] and pharmacy consulted to dose Vancomycin and Zosyn for FUO.  Noted plans for ID consult.    Tmax: 99.2 WBC: 13.6-->11.1 Renal: CrCl ~53 using rounded SCr 0.8 (N >78)  Microbiology: 1/9: Urinary strep pneumo neg 1/9: Urinary legionella collected 1/9: Influenza panel collected 1/9: Sputum collected 1/9: Blood  x2 collected   Goals of Therapy:   Vancomycin trough level 15-20 mcg/ml  Eradicate Infection  Appropriate antibiotic coverage  Plan:  Continue zosyn extended infusion.  Adjusted vancomycin to 500mg  IV q 12 hours as pt elderly, reduced renal function.    Ralene Bathe, PharmD, BCPS 09/23/2013, 8:21 PM  Pager: [phone]

## 2013-09-23 NOTE — H&P (Signed)
[facility] History and Physical  Ellen Beltran KZS:[id number] DOB: [birth year] DOA: 09/23/2013  Referring physician: ER physician PCP: Woody Seller, MD   Laurel Dimmer Complaint: fever of unknown origin  HPI:  68 year old female with past medical history of left invasive ductal breast carcinoma, on chemotherapy (taxol, carboplatin), dyslipidemia, seen today in cancer center and had complaints of ongoing fevers for past week and a half prior tot this admission. She has had associated upper respiratory tract symptoms with nasal drainage and occasional shortness of breath. She was on outpt amoxicillin for sinusitis and blood cultures, urine culture and CXR done at that time were negative. She continue to spike fever at home with highest temp slightly above 102F. She was sent from cancer center for evaluation of ongoing fevers. At this time she has no major complaints. This was a direct admission so ED events/blood work evaluation/diagnostic studies were not done at the time of the admission.  Assessment and Plan:  Principal Problem:   FUO (fever of unknown origin) - obtain blood cultures again, CXR, UA, Urine culture, pneumonia order set with strep pneumo, legionella, influenza  - start vanco and zosyn - obtain ID consult in am Active Problems:   Breast cancer - management per oncology   Leukocytosis, unspecified - perhaps related to ongoing fevers - will continue to monitor CBC   Anemia of chronic disease - related to history of breast ca and sequela of chemotherapy - hemoglobin 9.1 on 1/7 but then today 8.4 and 7.7 - follow up CBC in am   Radiological Exams on Admission: Dg Chest 2 View 09/23/2013     IMPRESSION: No active cardiopulmonary disease.     Code Status: Full Family Communication: Pt at bedside Disposition Plan: Admit for further evaluation  Ellen Lenz, MD  [facility] Pager [phone]  Review of Systems:  Constitutional: positive for fever, chills and  malaise/fatigue. Negative for diaphoresis.  HENT: Negative for hearing loss, ear pain, nosebleeds, positive for congestion and rhinorhea, no sore throat, neck pain, tinnitus and ear discharge.   Eyes: Negative for blurred vision, double vision, photophobia, pain, discharge and redness.  Respiratory: Negative for cough, hemoptysis, sputum production, shortness of breath, wheezing and stridor.   Cardiovascular: Negative for chest pain, palpitations, orthopnea, claudication and leg swelling.  Gastrointestinal: Negative for nausea, vomiting and abdominal pain. Negative for heartburn, constipation, blood in stool and melena.  Genitourinary: Negative for dysuria, urgency, frequency, hematuria and flank pain.  Musculoskeletal: Negative for myalgias, back pain, joint pain and falls.  Skin: Negative for itching and rash.  Neurological: Negative for dizziness and weakness. Negative for tingling, tremors, sensory change, speech change, focal weakness, loss of consciousness and headaches.  Endo/Heme/Allergies: Negative for environmental allergies and polydipsia. Does not bruise/bleed easily.  Psychiatric/Behavioral: Negative for suicidal ideas. The patient is not nervous/anxious.      Past Medical History  Diagnosis Date  . Hypertension   . Hyperlipidemia   . Osteopenia   . Arthritis   . Hot flashes   . Cancer     left breast   Past Surgical History  Procedure Laterality Date  . Cardiac catheterization  2004  . Tubal ligation  age 75  . Total abdominal hysterectomy  age 38 or 23  . Breast biopsy Left aug 2014  . Portacath placement N/A 05/02/2013    Procedure: INSERTION PORT-A-CATH;  Surgeon: Shann Medal, MD;  Location: [facility];  Service: General;  Laterality: N/A;   Social History:  reports that she  has never smoked. She has never used smokeless tobacco. She reports that she does not drink alcohol or use illicit drugs.  Allergies  Allergen Reactions  . Codeine Other (See Comments)    Sees  unusual things  . Morphine And Related Nausea Only  . Sulfa Antibiotics Rash    Family History  Problem Relation Age of Onset  . Heart disease Father   . Heart disease Brother   . Lung cancer Mother      Prior to Admission medications   Medication Sig Start Date End Date Taking? Authorizing Provider  Benfotiamine 150 MG CAPS Take 150 mg by mouth daily.    Yes Historical Provider, MD  Biotin 5000 MCG CAPS Take by mouth.   Yes Historical Provider, MD  dexamethasone (DECADRON) 4 MG tablet Take 2 tablets (8 mg total) by mouth 2 (two) times daily. BID x 3 days after chemo 07/15/13  Yes Vivien Rota, NP  gabapentin (NEURONTIN) 100 MG capsule Take 1 capsule (100 mg total) by mouth 3 (three) times daily. 09/02/13  Yes Minette Headland, NP  lidocaine-prilocaine (EMLA) cream Apply topically as needed. 04/27/13  Yes Deatra Robinson, MD  Multiple Vitamins-Minerals (ALIVE WOMENS 50+ PO) Take 2 each by mouth daily. Take 2 gummies per day   Yes Historical Provider, MD  potassium chloride SA (K-DUR,KLOR-CON) 20 MEQ tablet Take 1 tablet (20 mEq total) by mouth once. 09/22/13  Yes Deatra Robinson, MD  PRESCRIPTION MEDICATION Chemotherapy at [facility].   Yes Historical Provider, MD  prochlorperazine (COMPAZINE) 10 MG tablet Take 10 mg by mouth every 8 (eight) hours as needed.  04/27/13  Yes Historical Provider, MD  simvastatin (ZOCOR) 40 MG tablet Take 40 mg by mouth every evening.   Yes Historical Provider, MD   Physical Exam: Filed Vitals:   09/23/13 1228 09/23/13 1458 09/23/13 1609  BP: 134/70 137/67 121/56  Pulse: 106 114 121  Temp: 99 F (37.2 C)  99.2 F (37.3 C)  TempSrc: Oral  Oral  Resp: 18 16 16   Height:   4\' 10"  (1.473 m)  Weight:   48.807 kg (107 lb 9.6 oz)  SpO2: 95% 93% 94%    Physical Exam  Constitutional: Appears well-developed and well-nourished. No distress.  HENT: Normocephalic. External right and left ear normal. Oropharynx is clear and moist.  Eyes: Conjunctivae and EOM  are normal. PERRLA, no scleral icterus.  Neck: Normal ROM. Neck supple. No JVD. No tracheal deviation. No thyromegaly.  CVS: RRR, S1/S2 +, no murmurs, no gallops, no carotid bruit.  Pulmonary: Effort and breath sounds normal, no stridor, rhonchi, wheezes, rales.  Abdominal: Soft. BS +,  no distension, tenderness, rebound or guarding.  Musculoskeletal: Normal range of motion. No edema and no tenderness.  Lymphadenopathy: No lymphadenopathy noted, cervical, inguinal. Neuro: Alert. Normal reflexes, muscle tone coordination. No cranial nerve deficit. Skin: Skin is warm and dry. No rash noted. Not diaphoretic. No erythema. No pallor.  Psychiatric: Normal mood and affect. Behavior, judgment, thought content normal.   Labs on Admission:  Basic Metabolic Panel:  Recent Labs Lab 09/21/13 1101 09/23/13 0941 09/23/13 1345  NA 139 136 137  K 3.4* 3.8 3.5*  CL  --   --  95*  CO2 28 26 26   GLUCOSE 172* 172* 95  BUN 10.8 11.1 10  CREATININE 0.7 0.7 0.46*  CALCIUM 9.7 9.7 8.9  MG  --   --  1.9  PHOS  --   --  3.7   Liver  Function Tests:  Recent Labs Lab 09/21/13 1101 09/23/13 0941 09/23/13 1345  AST 73* 56* 68*  ALT 137* 123* 121*  ALKPHOS 87 76 73  BILITOT 0.26 0.28 0.3  PROT 6.9 6.5 6.4  ALBUMIN 2.3* 2.1* 2.2*   No results found for this basename: LIPASE, AMYLASE,  in the last 168 hours No results found for this basename: AMMONIA,  in the last 168 hours CBC:  Recent Labs Lab 09/21/13 1101 09/23/13 0940 09/23/13 1345  WBC 11.4* 13.6* 11.1*  NEUTROABS 6.5 8.7* 6.1  HGB 9.1* 8.4* 7.7*  HCT 27.0* 25.3* 23.6*  MCV 89.2 89.2 89.4  PLT 344 394 368   Cardiac Enzymes: No results found for this basename: CKTOTAL, CKMB, CKMBINDEX, TROPONINI,  in the last 168 hours BNP: No components found with this basename: POCBNP,  CBG: No results found for this basename: GLUCAP,  in the last 168 hours  If 7PM-7AM, please contact night-coverage [email or web address] Password [email or web address] 09/23/2013, 5:52  PM

## 2013-09-23 NOTE — ED Notes (Signed)
Bed: WA08 Expected date:  Expected time:  Means of arrival:  Comments: Hold- [facility]tr Pt, Fever of unknown origin

## 2013-09-23 NOTE — ED Notes (Signed)
Per pt, fever off and on for 10 days.  Pt doing chemo tx.  Last tx 3 weeks ago.  Breast cancer.

## 2013-09-23 NOTE — Progress Notes (Addendum)
[place]  [phone] [phone]  OFFICE PROGRESS NOTE   ID: Ellen Beltran   DOB: 11/17/1945  MR#: [id number]  PIR#:[id number]   PCP: Woody Seller, MD   DIAGNOSIS: Ellen Beltran is a 68 y.o. female diagnosed in 04/2013 with a clinical stage IIA, estrogen receptor negative, progesterone receptor negative, HER-2/neu neg, invasive ductal carcinoma of the left breast.   PRIOR THERAPY: 1. Patient went for annual screening mammogram in 03/2013 and mammography noted a mass at the 12 o'clock position. Diagnostic mammogram and ultrasound of the left breast was performed on 03/30/2013.  The ultrasound showed a 1.8 cm mass in the 12 o'clock position of the left breast.  Bilateral breast MRI performed on 04/25/2013 showed a 2.4 cm area of concern in the 12 o'clock position of the left breast. There were no abnormal appearing axillary subpectoral or internal mammary lymph nodes.  Left breast needle core biopsy performed on 04/15/2013 revealed invasive carcinoma with papillary features consistent with a ductal phenotype. Tumor grade was intermediate to high grade, estrogen receptor negative, progesterone receptor negative, with a proliferation marker Ki-67 of 47%, HER-2/neu by CISH showed no amplification.  2.  2-D echocardiogram performed on 05/06/2013 showed a left ventricular ejection fraction of 60%.  3. Neoadjuvant chemotherapy consisting of AC (Adriamycin/Cytoxan) began on 05/13/2013 through 06/24/2013 x 4 cycles with Neulasta injection for granulocyte support administered on day 2 of each cycle.    4.  Neoadjuvant chemotherapy consisting of Taxol/Carboplatin x 12 weekly cycles scheduled that started on 07/08/2013.  CURRENT THERAPY: Taxol/Carbo week 10 on HOLD  INTERVAL HISTORY: Ellen Beltran 68 y.o. female returns for evaluation today.  She has been having fevers for the past 10 days that have not seemed to go away.  She was last seen by Dr. Marko Plume  one week ago, and she was diagnosed with acute sinusitis and was prescribed Amoxicillin.  She has taken all of her antibiotics as directed.  Blood cultures were drawn and have been negative.  She underwent more cultures with urinalysis, urine culture, chest x ray on Wednesday 09/21/13 and those were negative.  She has no further skin rash or pain.  Her right leg tingles behind her knee, but otherwise denies nausea, vomiting, constipation, pain, cramping, cough, shortness of breath, sick contact or other etiology of infection.     MEDICAL HISTORY: Past Medical History  Diagnosis Date  . Hypertension   . Hyperlipidemia   . Osteopenia   . Arthritis   . Hot flashes   . Cancer     left breast    ALLERGIES:   Allergies  Allergen Reactions  . Codeine Other (See Comments)    Sees unusual things  . Morphine And Related Nausea Only  . Sulfa Antibiotics Rash    MEDICATIONS:  No current facility-administered medications for this visit.   No current outpatient prescriptions on file.   Facility-Administered Medications Ordered in Other Visits  Medication Dose Route Frequency Provider Last Rate Last Dose  . 0.9 %  sodium chloride infusion   Intravenous Continuous Robbie Lis, MD 75 mL/hr at 09/24/13 0240 1,000 mL at 09/24/13 0240  . acetaminophen (TYLENOL) tablet 650 mg  650 mg Oral Q6H PRN Robbie Lis, MD   650 mg at 09/23/13 2213   Or  . acetaminophen (TYLENOL) suppository 650 mg  650 mg Rectal Q6H PRN Robbie Lis, MD      . gabapentin (NEURONTIN) capsule 100 mg  100 mg Oral  TID Robbie Lis, MD   100 mg at 09/23/13 2210  . ondansetron (ZOFRAN) tablet 4 mg  4 mg Oral Q6H PRN Robbie Lis, MD       Or  . ondansetron [facility]) injection 4 mg  4 mg Intravenous Q6H PRN Robbie Lis, MD      . piperacillin-tazobactam (ZOSYN) IVPB 3.375 g  3.375 g Intravenous Q8H Colleen E Summe, RPH   3.375 g at 09/24/13 0541  . prochlorperazine (COMPAZINE) tablet 10 mg  10 mg Oral Q8H PRN Robbie Lis,  MD      . simvastatin (ZOCOR) tablet 40 mg  40 mg Oral QPM Robbie Lis, MD      . vancomycin (VANCOCIN) 500 mg in sodium chloride 0.9 % 100 mL IVPB  500 mg Intravenous Q12H Colleen E Summe, RPH   500 mg at 09/24/13 0300    SURGICAL HISTORY:  Past Surgical History  Procedure Laterality Date  . Cardiac catheterization  2004  . Tubal ligation  age 12  . Total abdominal hysterectomy  age 38 or 38  . Breast biopsy Left aug 2014  . Portacath placement N/A 05/02/2013    Procedure: INSERTION PORT-A-CATH;  Surgeon: Ellen Medal, MD;  Location: [facility];  Service: General;  Laterality: N/A;    REVIEW OF SYSTEMS:  A 10 point review of systems was completed and is negative except as noted above.    PHYSICAL EXAMINATION: Blood pressure 118/74, pulse 132, temperature 99 F (37.2 C), temperature source Oral, resp. rate 18, height '4\' 10"'  (1.473 m), weight 105 lb 8 oz (47.854 kg). Body mass index is 22.06 kg/(m^2). GENERAL: Patient is a well appearing female in no acute distress HEENT:  Sclerae anicteric.  Oropharynx clear and moist. No ulcerations or evidence of oropharyngeal candidiasis. Neck is supple.  NODES:  No cervical, supraclavicular, or axillary lymphadenopathy palpated.  BREAST EXAM:  Deferred. LUNGS:  Clear to auscultation bilaterally.  No wheezes or rhonchi. HEART:  Regular rate and rhythm. No murmur appreciated. ABDOMEN:  Soft, nontender.  Positive, normoactive bowel sounds. No organomegaly palpated. MSK:  No focal spinal tenderness to palpation. Full range of motion bilaterally in the upper extremities. EXTREMITIES:  No peripheral edema.  2+ pulses bilaterally.  SKIN:  Clear with no obvious rashes or skin changes. No nail dyscrasia. Vitiligo. NEURO:  Nonfocal. Well oriented.  Appropriate affect. ECOG PERFORMANCE STATUS: 1 - Symptomatic but completely ambulatory  LABORATORY DATA: Lab Results  Component Value Date   WBC 9.8 09/24/2013   HGB 7.2* 09/24/2013   HCT 22.9* 09/24/2013    MCV 91.2 09/24/2013   PLT 337 09/24/2013      Chemistry      Component Value Date/Time   NA 139 09/24/2013 0548   NA 136 09/23/2013 0941   K 3.6* 09/24/2013 0548   K 3.8 09/23/2013 0941   CL 99 09/24/2013 0548   CO2 28 09/24/2013 0548   CO2 26 09/23/2013 0941   BUN 11 09/24/2013 0548   BUN 11.1 09/23/2013 0941   CREATININE 0.55 09/24/2013 0548   CREATININE 0.7 09/23/2013 0941      Component Value Date/Time   CALCIUM 8.6 09/24/2013 0548   CALCIUM 9.7 09/23/2013 0941   ALKPHOS 61 09/24/2013 0548   ALKPHOS 76 09/23/2013 0941   AST 57* 09/24/2013 0548   AST 56* 09/23/2013 0941   ALT 100* 09/24/2013 0548   ALT 123* 09/23/2013 0941   BILITOT 0.2* 09/24/2013 0548   BILITOT 0.28  09/23/2013 0941       RADIOGRAPHIC STUDIES: No results found.  ASSESSMENT: ORIANNA BISKUP is a 68 y.o. female with:  #1 Screening mammogram in 03/2013 noted a mass at the 12 o'clock position.  Diagnostic mammogram and ultrasound of the left breast was performed on 03/30/2013.  The ultrasound showed a 1.8 cm mass in the 12 o'clock position of the left breast. Bilateral breast MRI  performed on 04/25/2013 showed a 2.4 cm area of concern in the 12 o'clock position of the left breast. There were no abnormal appearing axillary subpectoral or internal mammary lymph nodes.  Left breast needle core biopsy performed on 04/15/2013 revealed invasive carcinoma with papillary features consistent with a ductal phenotype. Tumor grade was intermediate to high grade, estrogen receptor negative, progesterone receptor negative, with a proliferation marker Ki-67 of 47%, HER-2/neu by CISH showed no amplification.  #2  2-D echocardiogram performed on 05/06/2013 showed a left ventricular ejection fraction of 60%.  #3 Neoadjuvant chemotherapy consisting of AC (Adriamycin/Cytoxan) began on 05/13/2013 through 06/24/2013 x 4 cycles with Neulasta injection for granulocyte support administered on day 2 of each cycle.    #4  Neoadjuvant chemotherapy consisting of  Taxol/Carboplatin x 12 weekly cycles started on 07/08/2013.  Patient has not received treatment since 09/02/13 due to shortness of breath and tachycardia on 12/26, and a fever along with anemia on 09/16/13.     PLAN:   #1  Due to the patient's continued fevers of unclear origin, we will request the [facility] admit her to the hospital, draw more blood cultures, start broad spectrum antibiotics, and get infectious disease involved.  She also has a mild transaminitis.  She will also likely need to be evaluated by her surgeon, Dr. Lucia Gaskins or associate as her indwelling port may be the cause of her fevers.     All questions answered. Mr. and Mrs. Lung were encouraged to contact us in the interim with any questions, concerns, or problems.  I spent 40 minutes counseling the patient face to face.  The total time spent in the appointment was 60 minutes.  Minette Headland, [place] [phone] 09/24/2013    9:25 AM  ATTENDING'S ATTESTATION:  I personally reviewed patient's chart, examined patient myself, formulated the treatment plan as followed.    Patient clinically not doing well. I am concerned about ongoing fevers and sinus congestion. In spite of outpatient treatment with oral antibiotics she has persistant fevers. We will admitted the patient for full infectious work up.   Patient and family are in agreement.  Marcy Panning, MD Medical/Oncology [facility] [phone] (beeper) [phone] (Office)  10/08/2013, 2:22 PM

## 2013-09-24 DIAGNOSIS — Z9289 Personal history of other medical treatment: Secondary | ICD-10-CM

## 2013-09-24 HISTORY — DX: Personal history of other medical treatment: Z92.89

## 2013-09-24 LAB — CBC
HEMATOCRIT: 22.9 % — AB (ref 36.0–46.0)
HEMOGLOBIN: 7.2 g/dL — AB (ref 12.0–15.0)
MCH: 28.7 pg (ref 26.0–34.0)
MCHC: 31.4 g/dL (ref 30.0–36.0)
MCV: 91.2 fL (ref 78.0–100.0)
Platelets: 337 10*3/uL (ref 150–400)
RBC: 2.51 MIL/uL — ABNORMAL LOW (ref 3.87–5.11)
RDW: 17 % — ABNORMAL HIGH (ref 11.5–15.5)
WBC: 9.8 10*3/uL (ref 4.0–10.5)

## 2013-09-24 LAB — COMPREHENSIVE METABOLIC PANEL
ALT: 100 U/L — AB (ref 0–35)
AST: 57 U/L — ABNORMAL HIGH (ref 0–37)
Albumin: 2 g/dL — ABNORMAL LOW (ref 3.5–5.2)
Alkaline Phosphatase: 61 U/L (ref 39–117)
BUN: 11 mg/dL (ref 6–23)
CALCIUM: 8.6 mg/dL (ref 8.4–10.5)
CO2: 28 mEq/L (ref 19–32)
Chloride: 99 mEq/L (ref 96–112)
Creatinine, Ser: 0.55 mg/dL (ref 0.50–1.10)
GFR calc Af Amer: >90 mL/min (ref >90)
GFR calc non Af Amer: >90 mL/min (ref >90)
Glucose, Bld: 99 mg/dL (ref 70–99)
POTASSIUM: 3.6 meq/L — AB (ref 3.7–5.3)
Sodium: 139 mEq/L (ref 137–147)
TOTAL PROTEIN: 5.5 g/dL — AB (ref 6.0–8.3)
Total Bilirubin: 0.2 mg/dL — ABNORMAL LOW (ref 0.3–1.2)

## 2013-09-24 LAB — HEPATITIS PANEL, ACUTE
HCV Ab: NEGATIVE
HEP B C IGM: NONREACTIVE
Hep A IgM: NONREACTIVE
Hepatitis B Surface Ag: NEGATIVE

## 2013-09-24 LAB — HIV ANTIBODY (ROUTINE TESTING W REFLEX): HIV: NONREACTIVE

## 2013-09-24 LAB — LEGIONELLA ANTIGEN, URINE: Legionella Antigen, Urine: NEGATIVE

## 2013-09-24 LAB — PREPARE RBC (CROSSMATCH)

## 2013-09-24 LAB — GLUCOSE, CAPILLARY: Glucose-Capillary: 88 mg/dL (ref 70–99)

## 2013-09-24 MED ORDER — POTASSIUM CHLORIDE CRYS ER 20 MEQ PO TBCR
40.0000 meq | EXTENDED_RELEASE_TABLET | Freq: Once | ORAL | Status: AC
  Administered 2013-09-24: 40 meq via ORAL
  Filled 2013-09-24: qty 2

## 2013-09-24 NOTE — Progress Notes (Signed)
[facility] PROGRESS NOTE  Ellen Beltran QQV:[id number] DOB: May 04, 1946 DOA: 09/23/2013 PCP: Woody Seller, MD  Brief narrative: 68 year old female with past medical history of left invasive ductal breast carcinoma, on chemotherapy (taxol, carboplatin), dyslipidemia, seen today in cancer center and had complaints of ongoing fevers for past week and a half prior tot this admission. She was on outpt amoxicillin for sinusitis and blood cultures, urine culture and CXR done at that time were negative. She was dierctly admitted for cancer center for FUO.  Assessment and Plan:   Principal Problem:  FUO (fever of unknown origin)  - obtained blood cultures CXR, UA, Urine culture, pneumonia order set with strep pneumo, legionella, influenza. Blood cultures so far negative; influenza, legionella, strep pneumo all negative  - spike fever of 101.2 F in past 24 hours - continue vanco and zosyn; may consider antifungal treatment  - may also require removal of port-a-cath   Active Problems:  Breast cancer  - management per oncology  Leukocytosis, unspecified  - related to ongoing fevers  - will continue to monitor CBC  Anemia of chronic disease  - related to history of breast ca and sequela of chemotherapy  - hemoglobin 9.1 on 1/7 but then  8.4 and 7.7 --> 7.2 - will give 1 unit PRBC transfusion today   Radiological Exams on Admission:  Dg Chest 2 View  09/23/2013 IMPRESSION: No active cardiopulmonary disease.    Code Status: Full  Family Communication: family at the bedside  Disposition Plan: home when stable    Leisa Lenz, MD  [facility] Pager [phone]  If 7PM-7AM, please contact night-coverage [email or web address] Password [email or web address] 09/24/2013, 2:40 PM   LOS: 1 day   Consultants:  Oncology  Infectious disease   Procedures:  None   Antibiotics:  Vancomycin 09/23/2013 -->  Zosyn 09/23/2013 -->  HPI/Subjective: No acute overnight events.  Objective: Filed  Vitals:   09/24/13 1144 09/24/13 1209 09/24/13 1309 09/24/13 1409  BP: 106/54 103/63 101/69 116/54  Pulse: 118 104 105 103  Temp: 97.7 F (36.5 C) 97.5 F (36.4 C) 98.9 F (37.2 C) 98.1 F (36.7 C)  TempSrc: Oral Oral Oral Oral  Resp: 17 16 16 17   Height:      Weight:      SpO2: 94% 100% 100% 99%    Intake/Output Summary (Last 24 hours) at 09/24/13 1440 Last data filed at 09/24/13 1346  Gross per 24 hour  Intake 2811.5 ml  Output   1700 ml  Net 1111.5 ml    Exam:   General:  Pt is alert, follows commands appropriately, not in acute distress  Cardiovascular: Regular rate and rhythm, S1/S2, no murmurs, no rubs, no gallops  Respiratory: Clear to auscultation bilaterally, no wheezing, no crackles, no rhonchi  Abdomen: Soft, non tender, non distended, bowel sounds present, no guarding  Extremities: No edema, pulses DP and PT palpable bilaterally  Neuro: Grossly nonfocal  Data Reviewed: Basic Metabolic Panel:  Recent Labs Lab 09/21/13 1101 09/23/13 0941 09/23/13 1345 09/24/13 0548  NA 139 136 137 139  K 3.4* 3.8 3.5* 3.6*  CL  --   --  95* 99  CO2 28 26 26 28   GLUCOSE 172* 172* 95 99  BUN 10.8 11.1 10 11   CREATININE 0.7 0.7 0.46* 0.55  CALCIUM 9.7 9.7 8.9 8.6  MG  --   --  1.9  --   PHOS  --   --  3.7  --    Liver Function Tests:  Recent Labs Lab 09/21/13 1101 09/23/13 0941 09/23/13 1345 09/24/13 0548  AST 73* 56* 68* 57*  ALT 137* 123* 121* 100*  ALKPHOS 87 76 73 61  BILITOT 0.26 0.28 0.3 0.2*  PROT 6.9 6.5 6.4 5.5*  ALBUMIN 2.3* 2.1* 2.2* 2.0*   No results found for this basename: LIPASE, AMYLASE,  in the last 168 hours No results found for this basename: AMMONIA,  in the last 168 hours CBC:  Recent Labs Lab 09/21/13 1101 09/23/13 0940 09/23/13 1345 09/24/13 0548  WBC 11.4* 13.6* 11.1* 9.8  NEUTROABS 6.5 8.7* 6.1  --   HGB 9.1* 8.4* 7.7* 7.2*  HCT 27.0* 25.3* 23.6* 22.9*  MCV 89.2 89.2 89.4 91.2  PLT 344 394 368 337   Cardiac  Enzymes: No results found for this basename: CKTOTAL, CKMB, CKMBINDEX, TROPONINI,  in the last 168 hours BNP: No components found with this basename: POCBNP,  CBG:  Recent Labs Lab 09/24/13 0751  GLUCAP 88    Recent Results (from the past 240 hour(s))  CULTURE, BLOOD (SINGLE)     Status: None   Collection Time    09/16/13 10:10 AM      Result Value Range Status   Blood Culture, Routine Culture, Blood   Final   Comment: Final - ===== FINAL REPORT =====NO GROWTH 5 DAYS  URINE CULTURE     Status: None   Collection Time    09/21/13 11:01 AM      Result Value Range Status   Urine Culture, Routine Culture, Urine   Final   Comment: Final - ===== COLONY COUNT: =====     NO GROWTH     NO GROWTH  CULTURE, BLOOD (ROUTINE X 2)     Status: None   Collection Time    09/23/13  1:36 PM      Result Value Range Status   Specimen Description BLOOD PORTA CATH   Final   Special Requests BOTTLES DRAWN AEROBIC AND ANAEROBIC 4ML   Final   Culture  Setup Time     Final   Value: 09/23/2013 20:25     Performed at [facility]   Culture     Final   Value:        BLOOD CULTURE RECEIVED NO GROWTH TO DATE CULTURE WILL BE HELD FOR 5 DAYS BEFORE ISSUING A FINAL NEGATIVE REPORT     Performed at [facility]   Report Status PENDING   Incomplete  CULTURE, BLOOD (ROUTINE X 2)     Status: None   Collection Time    09/23/13  2:05 PM      Result Value Range Status   Specimen Description BLOOD LEFT HAND   Final   Special Requests BOTTLES DRAWN AEROBIC ONLY 3ML   Final   Culture  Setup Time     Final   Value: 09/23/2013 20:26     Performed at [facility]   Culture     Final   Value:        BLOOD CULTURE RECEIVED NO GROWTH TO DATE CULTURE WILL BE HELD FOR 5 DAYS BEFORE ISSUING A FINAL NEGATIVE REPORT     Performed at [facility]   Report Status PENDING   Incomplete  CULTURE, EXPECTORATED SPUTUM-ASSESSMENT     Status: None   Collection Time    09/23/13  7:59 PM       Result Value Range Status   Specimen Description SPUTUM   Final   Special Requests Immunocompromised  Final   Sputum evaluation     Final   Value: THIS SPECIMEN IS ACCEPTABLE. RESPIRATORY CULTURE REPORT TO FOLLOW.   Report Status 09/23/2013 FINAL   Final  CULTURE, RESPIRATORY (NON-EXPECTORATED)     Status: None   Collection Time    09/23/13  7:59 PM      Result Value Range Status   Specimen Description SPUTUM   Final   Special Requests NONE   Final   Gram Stain     Final   Value: MODERATE WBC PRESENT,BOTH PMN AND MONONUCLEAR     NO SQUAMOUS EPITHELIAL CELLS SEEN     MODERATE GRAM POSITIVE COCCI IN PAIRS     RARE GRAM NEGATIVE RODS     RARE GRAM POSITIVE RODS   Culture PENDING   Incomplete   Report Status PENDING   Incomplete     Studies: Dg Chest 2 View  09/23/2013   CLINICAL DATA:  Fever, breast cancer on chemotherapy  EXAM: CHEST  2 VIEW  COMPARISON:  09/21/2013  FINDINGS: Cardiomediastinal silhouette is stable. Mild thoracic dextroscoliosis again noted. Stable right subclavian Port-A-Cath position. No acute infiltrate or pulmonary edema. Mild degenerative changes mid thoracic spine.  IMPRESSION: No active cardiopulmonary disease.   Electronically Signed   By: Lahoma Crocker M.D.   On: 09/23/2013 14:31    Scheduled Meds: . gabapentin  100 mg Oral TID  . piperacillin-tazobactam (ZOSYN)  IV  3.375 g Intravenous Q8H  . simvastatin  40 mg Oral QPM  . vancomycin  500 mg Intravenous Q12H   Continuous Infusions: . sodium chloride 1,000 mL (09/24/13 0240)

## 2013-09-24 NOTE — Evaluation (Signed)
Occupational Therapy Evaluation Patient Details Name: Ellen Beltran MRN: [id number] DOB: 07-Dec-1945 Today's Date: 09/24/2013 Time: 4315-4008 OT Time Calculation (min): 25 min  OT Assessment / Plan / Recommendation History of present illness 68 year old female with past medical history of left invasive ductal breast carcinoma, on chemotherapy (taxol, carboplatin), dyslipidemia, seen today in cancer center and had complaints of ongoing fevers for past week and a half prior tot this admission. She has had associated upper respiratory tract symptoms with nasal drainage and occasional shortness of breath. She was on outpt amoxicillin for sinusitis and blood cultures, urine culture and CXR done at that time were negative. She continue to spike fever at home with highest temp slightly above 102F. She was sent from cancer center for evaluation of ongoing fevers. At this time she has no major complaints. This was a direct admission so ED events/blood work evaluation/diagnostic studies were not done at the time of the admission.   Clinical Impression   Pt presents to OT with decreased I with ADL activity and will benefit from skilled OT to increase I with ADL activity and return to PLOF.    OT Assessment  Patient needs continued OT Services    Follow Up Recommendations  Home health OT       Equipment Recommendations  None recommended by OT       Frequency  Min 2X/week    Precautions / Restrictions Restrictions Weight Bearing Restrictions: No       ADL  Grooming: Set up Where Assessed - Grooming: Unsupported sitting Upper Body Bathing: Set up Where Assessed - Upper Body Bathing: Unsupported sitting Lower Body Bathing: Minimal assistance Where Assessed - Lower Body Bathing: Supported sit to stand Upper Body Dressing: Set up Where Assessed - Upper Body Dressing: Unsupported sitting Lower Body Dressing: Minimal assistance Where Assessed - Lower Body Dressing: Supported sit to  stand Toilet Transfer: Minimal assistance Toilet Transfer Method: Sit to Loss adjuster, chartered: Regular height toilet Toileting - Clothing Manipulation and Hygiene: Minimal assistance Where Assessed - Camera operator Manipulation and Hygiene: Standing    OT Diagnosis: Generalized weakness  OT Problem List: Decreased strength;Decreased activity tolerance OT Treatment Interventions: Self-care/ADL training;Patient/family education;DME and/or AE instruction   OT Goals(Current goals can be found in the care plan section) Acute Rehab OT Goals Patient Stated Goal: get back home and be stronger OT Goal Formulation: With patient Time For Goal Achievement: 10/08/13 ADL Goals Pt Will Perform Grooming: with supervision;sitting Pt Will Perform Upper Body Bathing: with supervision;sitting Pt Will Perform Lower Body Bathing: with supervision;sit to/from stand Pt Will Perform Upper Body Dressing: with supervision;sitting Pt Will Perform Lower Body Dressing: with supervision;sit to/from stand Pt Will Transfer to Toilet: with supervision;regular height toilet Pt Will Perform Toileting - Clothing Manipulation and hygiene: with supervision;sit to/from stand Pt Will Perform Tub/Shower Transfer: with supervision;shower seat  Visit Information  Last OT Received On: 09/24/13 History of Present Illness: 68 year old female with past medical history of left invasive ductal breast carcinoma, on chemotherapy (taxol, carboplatin), dyslipidemia, seen today in cancer center and had complaints of ongoing fevers for past week and a half prior tot this admission. She has had associated upper respiratory tract symptoms with nasal drainage and occasional shortness of breath. She was on outpt amoxicillin for sinusitis and blood cultures, urine culture and CXR done at that time were negative. She continue to spike fever at home with highest temp slightly above 102F. She was sent from cancer center for  evaluation  of ongoing fevers. At this time she has no major complaints. This was a direct admission so ED events/blood work evaluation/diagnostic studies were not done at the time of the admission.       Prior [place] expects to be discharged to:: Private residence Living Arrangements: Spouse/significant other Available Help at Discharge: Family Type of Home: House Home Access: Stairs to enter Technical brewer of Steps: 2 Home Layout: One [place] - single point;Shower seat Prior Function Level of Independence: Independent Comments: I with simple ADL activity Communication Communication: No difficulties         Vision/Perception Vision - History Patient Visual Report: No change from baseline   Cognition  Cognition Arousal/Alertness: Awake/alert Behavior During Therapy: WFL for tasks assessed/performed Overall Cognitive Status: Within Functional Limits for tasks assessed    Extremity/Trunk Assessment Upper Extremity Assessment Upper Extremity Assessment: Generalized weakness     Mobility Bed Mobility General bed mobility comments: supervision Transfers General transfer comment: overall min A           End of Session OT - End of Session Activity Tolerance: Patient tolerated treatment well Patient left: in bed;with call bell/phone within reach;with family/visitor present  GO     [place], Thereasa Parkin 09/24/2013, 10:31 AM

## 2013-09-24 NOTE — Progress Notes (Signed)
D/c'd Droplet precautions. Flu PCR negative.

## 2013-09-24 NOTE — Progress Notes (Signed)
Patient's o2 sat <90 on room air when spot checked with vitals. Applied Oxygen at 2 l [place] with improvement in sats.

## 2013-09-24 NOTE — Consult Note (Signed)
INFECTIOUS DISEASE CONSULT NOTE  Date of Admission:  09/23/2013  Date of Consult:  09/24/2013  Reason for Consult: Fever Referring Physician: Charlies Silvers  Impression/Recommendation Fever Breast Cancer R supraclavicular induration  Would Continue her current anbx Consider CT chest and neck  Comment: She had neagtive CT chest on 12-26 however I am concerned about the swelling in her R supraclavicular fossa and R neck. Could she have thrombosis related to her port? Is this worsening metastatic disease.  From reviewing her chart, she appears improved over last 12 hours (dcreased temp, WBC decreased as well).  Thank you so much for this interesting consult,   Bobby Rumpf (pager) [phone] [email or web address]  Ellen Beltran is an 68 y.o. female.  HPI: 68 yo F with hx of L invasive ductal breast ca (on taxol/carboplatin, last dose 12-19) came to [facility] on 1-9 with 9 days of fever. She was given amoxil as an outpt for URI sx without improvement.  On adm she was found to have temp 100.3, WBC 11.4 and CXR (-). She was started on vanco/zosyn. Her flu PCR was (-). She has temp 101.2 overnight.   Past Medical History  Diagnosis Date  . Hypertension   . Hyperlipidemia   . Osteopenia   . Arthritis   . Hot flashes   . Cancer     left breast    Past Surgical History  Procedure Laterality Date  . Cardiac catheterization  2004  . Tubal ligation  age 62  . Total abdominal hysterectomy  age 5 or 7  . Breast biopsy Left aug 2014  . Portacath placement N/A 05/02/2013    Procedure: INSERTION PORT-A-CATH;  Surgeon: Shann Medal, MD;  Location: [facility];  Service: General;  Laterality: N/A;     Allergies  Allergen Reactions  . Codeine Other (See Comments)    Sees unusual things  . Morphine And Related Nausea Only  . Sulfa Antibiotics Rash    Medications:  Scheduled: . gabapentin  100 mg Oral TID  . piperacillin-tazobactam (ZOSYN)  IV  3.375 g Intravenous Q8H  . potassium chloride   40 mEq Oral Once  . simvastatin  40 mg Oral QPM  . vancomycin  500 mg Intravenous Q12H    Total days of antibiotics: 1 (vanco/zosyn)         Social History:  reports that she has never smoked. She has never used smokeless tobacco. She reports that she does not drink alcohol or use illicit drugs.  Family History  Problem Relation Age of Onset  . Heart disease Father   . Heart disease Brother   . Lung cancer Mother     General ROS: no sick exposures, normal BM, normal urination, no problems with port, clear sinus d/c with occas blood, + neuropathy, no headache, no sinus pain, increasing weakness, no oral ulcers. see HPI  Blood pressure 116/54, pulse 103, temperature 98.1 F (36.7 C), temperature source Oral, resp. rate 17, height _0  (1.473 m), weight 48.626 kg (107 lb 3.2 oz), SpO2 99.00%. General appearance: alert, cooperative and no distress Eyes: negative findings: conjunctivae and sclerae normal and pupils equal, round, reactive to light and accomodation Throat: normal findings: oropharynx pink & moist without lesions or evidence of thrush Neck: supple, symmetrical, trachea midline and swelling/induraion from R supraclavicular fossa, extending into R neck Lungs: clear to auscultation bilaterally and R chest port is non-tender, no erythema, no fluctuance.  Heart: regular rate and rhythm Abdomen: normal findings: bowel sounds normal and soft, non-tender  Extremities: edema none Lymph nodes: Cervical adenopathy: R neck and Supraclavicular adenopathy: R [place] fossa   Results for orders placed during the hospital encounter of 09/23/13 (from the past 48 hour(s))  CULTURE, BLOOD (ROUTINE X 2)     Status: None   Collection Time    09/23/13  1:36 PM      Result Value Range   Specimen Description BLOOD PORTA CATH     Special Requests BOTTLES DRAWN AEROBIC AND ANAEROBIC 4ML     Culture  Setup Time       Value: 09/23/2013 20:25     Performed at [facility]   Culture        Value:        BLOOD CULTURE RECEIVED NO GROWTH TO DATE CULTURE WILL BE HELD FOR 5 DAYS BEFORE ISSUING A FINAL NEGATIVE REPORT     Performed at [facility]   Report Status PENDING    HEPATITIS PANEL, ACUTE     Status: None   Collection Time    09/23/13  1:36 PM      Result Value Range   Hepatitis B Surface Ag NEGATIVE  NEGATIVE   HCV Ab NEGATIVE  NEGATIVE   Hep A IgM NON REACTIVE  NON REACTIVE   Hep B C IgM NON REACTIVE  NON REACTIVE   Comment: (NOTE)     High levels of Hepatitis B Core IgM antibody are detectable     during the acute stage of Hepatitis B. This antibody is used     to differentiate current from past HBV infection.     Performed at [facility]  HIV ANTIBODY (ROUTINE TESTING)     Status: None   Collection Time    09/23/13  1:36 PM      Result Value Range   HIV NON REACTIVE  NON REACTIVE   Comment: Performed at [place]     Status: Abnormal   Collection Time    09/23/13  1:45 PM      Result Value Range   Sodium 137  137 - 147 mEq/L   Potassium 3.5 (*) 3.7 - 5.3 mEq/L   Chloride 95 (*) 96 - 112 mEq/L   CO2 26  19 - 32 mEq/L   Glucose, Bld 95  70 - 99 mg/dL   BUN 10  6 - 23 mg/dL   Creatinine, Ser 0.46 (*) 0.50 - 1.10 mg/dL   Calcium 8.9  8.4 - 10.5 mg/dL   Total Protein 6.4  6.0 - 8.3 g/dL   Albumin 2.2 (*) 3.5 - 5.2 g/dL   AST 68 (*) 0 - 37 U/L   ALT 121 (*) 0 - 35 U/L   Alkaline Phosphatase 73  39 - 117 U/L   Total Bilirubin 0.3  0.3 - 1.2 mg/dL   GFR calc non Af Amer >90  >90 mL/min   GFR calc Af Amer >90  >90 mL/min   Comment: (NOTE)     The eGFR has been calculated using the CKD EPI equation.     This calculation has not been validated in all clinical situations.     eGFR's persistently <90 mL/min signify possible Chronic Kidney     Disease.  PHOSPHORUS     Status: None   Collection Time    09/23/13  1:45 PM      Result Value Range   Phosphorus 3.7  2.3 - 4.6 mg/dL  MAGNESIUM     Status:  None   Collection Time    09/23/13  1:45 PM      Result Value Range   Magnesium 1.9  1.5 - 2.5 mg/dL  APTT     Status: None   Collection Time    09/23/13  1:45 PM      Result Value Range   aPTT 29  24 - 37 seconds  PROTIME-INR     Status: None   Collection Time    09/23/13  1:45 PM      Result Value Range   Prothrombin Time 13.5  11.6 - 15.2 seconds   INR 1.05  0.00 - 1.49  TSH     Status: None   Collection Time    09/23/13  1:45 PM      Result Value Range   TSH 1.007  0.350 - 4.500 uIU/mL   Comment: Performed at [facility]  CBC WITH DIFFERENTIAL     Status: Abnormal   Collection Time    09/23/13  1:45 PM      Result Value Range   WBC 11.1 (*) 4.0 - 10.5 K/uL   RBC 2.64 (*) 3.87 - 5.11 MIL/uL   Hemoglobin 7.7 (*) 12.0 - 15.0 g/dL   HCT 23.6 (*) 36.0 - 46.0 %   MCV 89.4  78.0 - 100.0 fL   MCH 29.2  26.0 - 34.0 pg   MCHC 32.6  30.0 - 36.0 g/dL   RDW 16.7 (*) 11.5 - 15.5 %   Platelets 368  150 - 400 K/uL   Neutrophils Relative % 55  43 - 77 %   Neutro Abs 6.1  1.7 - 7.7 K/uL   Lymphocytes Relative 31  12 - 46 %   Lymphs Abs 3.5  0.7 - 4.0 K/uL   Monocytes Relative 11  3 - 12 %   Monocytes Absolute 1.2 (*) 0.1 - 1.0 K/uL   Eosinophils Relative 3  0 - 5 %   Eosinophils Absolute 0.3  0.0 - 0.7 K/uL   Basophils Relative 1  0 - 1 %   Basophils Absolute 0.1  0.0 - 0.1 K/uL  CULTURE, BLOOD (ROUTINE X 2)     Status: None   Collection Time    09/23/13  2:05 PM      Result Value Range   Specimen Description BLOOD LEFT HAND     Special Requests BOTTLES DRAWN AEROBIC ONLY 3ML     Culture  Setup Time       Value: 09/23/2013 20:26     Performed at [facility]   Culture       Value:        BLOOD CULTURE RECEIVED NO GROWTH TO DATE CULTURE WILL BE HELD FOR 5 DAYS BEFORE ISSUING A FINAL NEGATIVE REPORT     Performed at [facility]   Report Status PENDING    URINALYSIS, ROUTINE W REFLEX MICROSCOPIC     Status: Abnormal   Collection Time    09/23/13   3:45 PM      Result Value Range   Color, Urine YELLOW  YELLOW   APPearance CLOUDY (*) CLEAR   Specific Gravity, Urine 1.014  1.005 - 1.030   pH 5.5  5.0 - 8.0   Glucose, UA NEGATIVE  NEGATIVE mg/dL   Hgb urine dipstick NEGATIVE  NEGATIVE   Bilirubin Urine NEGATIVE  NEGATIVE   Ketones, ur NEGATIVE  NEGATIVE mg/dL   Protein, ur NEGATIVE  NEGATIVE mg/dL   Urobilinogen, UA 0.2  0.0 - 1.0 mg/dL   Nitrite NEGATIVE  NEGATIVE   Leukocytes, UA NEGATIVE  NEGATIVE   Comment: MICROSCOPIC NOT DONE ON URINES WITH NEGATIVE PROTEIN, BLOOD, LEUKOCYTES, NITRITE, OR GLUCOSE <1000 mg/dL.  LEGIONELLA ANTIGEN, URINE     Status: None   Collection Time    09/23/13  3:45 PM      Result Value Range   Specimen Description URINE, CLEAN CATCH     Special Requests NONE     Legionella Antigen, Urine       Value: Negative for Legionella pneumophilia serogroup 1     Performed at [facility]   Report Status 09/24/2013 FINAL    STREP PNEUMONIAE URINARY ANTIGEN     Status: None   Collection Time    09/23/13  3:45 PM      Result Value Range   Strep Pneumo Urinary Antigen NEGATIVE  NEGATIVE   Comment: Performed at [facility]                Infection due to S. pneumoniae     cannot be absolutely ruled out     since the antigen present     may be below the detection limit     of the test.     Performed at [place] PCR (TYPE A & B, H1N1)     Status: None   Collection Time    09/23/13  4:02 PM      Result Value Range   Influenza A By PCR NEGATIVE  NEGATIVE   Influenza B By PCR NEGATIVE  NEGATIVE   H1N1 flu by pcr NOT DETECTED  NOT DETECTED   Comment:            The Xpert Flu assay (FDA approved for     nasal aspirates or washes and     nasopharyngeal swab specimens), is     intended as an aid in the diagnosis of     influenza and should not be used as     a sole basis for treatment.     Performed at [facility], EXPECTORATED  SPUTUM-ASSESSMENT     Status: None   Collection Time    09/23/13  7:59 PM      Result Value Range   Specimen Description SPUTUM     Special Requests Immunocompromised     Sputum evaluation       Value: THIS SPECIMEN IS ACCEPTABLE. RESPIRATORY CULTURE REPORT TO FOLLOW.   Report Status 09/23/2013 FINAL    CULTURE, RESPIRATORY (NON-EXPECTORATED)     Status: None   Collection Time    09/23/13  7:59 PM      Result Value Range   Specimen Description SPUTUM     Special Requests NONE     Gram Stain       Value: MODERATE WBC PRESENT,BOTH PMN AND MONONUCLEAR     NO SQUAMOUS EPITHELIAL CELLS SEEN     MODERATE GRAM POSITIVE COCCI IN PAIRS     RARE GRAM NEGATIVE RODS     RARE GRAM POSITIVE RODS   Culture PENDING     Report Status PENDING    COMPREHENSIVE METABOLIC PANEL     Status: Abnormal   Collection Time    09/24/13  5:48 AM      Result Value Range   Sodium 139  137 - 147 mEq/L   Potassium 3.6 (*) 3.7 - 5.3 mEq/L   Chloride 99  96 - 112 mEq/L   CO2 28  19 - 32 mEq/L   Glucose, Bld 99  70 - 99 mg/dL   BUN 11  6 - 23 mg/dL   Creatinine, Ser 0.55  0.50 - 1.10 mg/dL   Calcium 8.6  8.4 - 10.5 mg/dL   Total Protein 5.5 (*) 6.0 - 8.3 g/dL   Albumin 2.0 (*) 3.5 - 5.2 g/dL   AST 57 (*) 0 - 37 U/L   ALT 100 (*) 0 - 35 U/L   Alkaline Phosphatase 61  39 - 117 U/L   Total Bilirubin 0.2 (*) 0.3 - 1.2 mg/dL   GFR calc non Af Amer >90  >90 mL/min   GFR calc Af Amer >90  >90 mL/min   Comment: (NOTE)     The eGFR has been calculated using the CKD EPI equation.     This calculation has not been validated in all clinical situations.     eGFR's persistently <90 mL/min signify possible Chronic Kidney     Disease.  CBC     Status: Abnormal   Collection Time    09/24/13  5:48 AM      Result Value Range   WBC 9.8  4.0 - 10.5 K/uL   RBC 2.51 (*) 3.87 - 5.11 MIL/uL   Hemoglobin 7.2 (*) 12.0 - 15.0 g/dL   HCT 22.9 (*) 36.0 - 46.0 %   MCV 91.2  78.0 - 100.0 fL   MCH 28.7  26.0 - 34.0 pg   MCHC  31.4  30.0 - 36.0 g/dL   RDW 17.0 (*) 11.5 - 15.5 %   Platelets 337  150 - 400 K/uL  GLUCOSE, CAPILLARY     Status: None   Collection Time    09/24/13  7:51 AM      Result Value Range   Glucose-Capillary 88  70 - 99 mg/dL   Comment 1 Documented in Chart     Comment 2 Notify RN    TYPE AND SCREEN     Status: None   Collection Time    09/24/13  8:50 AM      Result Value Range   ABO/RH(D) O POS     Antibody Screen NEG     Sample Expiration 09/27/2013     Unit Number [id number]     Blood Component Type RED CELLS,LR     Unit division 00     Status of Unit ISSUED     Transfusion Status OK TO TRANSFUSE     Crossmatch Result Compatible    PREPARE RBC (CROSSMATCH)     Status: None   Collection Time    09/24/13  8:50 AM      Result Value Range   Order Confirmation ORDER PROCESSED BY BLOOD BANK        Component Value Date/Time   SDES SPUTUM 09/23/2013 1959   SDES SPUTUM 09/23/2013 1959   SPECREQUEST Immunocompromised 09/23/2013 1959   Young NONE 09/23/2013 1959   CULT PENDING 09/23/2013 1959   REPTSTATUS 09/23/2013 FINAL 09/23/2013 1959   REPTSTATUS PENDING 09/23/2013 1959   Dg Chest 2 View  09/23/2013   CLINICAL DATA:  Fever, breast cancer on chemotherapy  EXAM: CHEST  2 VIEW  COMPARISON:  09/21/2013  FINDINGS: Cardiomediastinal silhouette is stable. Mild thoracic dextroscoliosis again noted. Stable right subclavian Port-A-Cath position. No acute infiltrate or pulmonary edema. Mild degenerative changes mid thoracic spine.  IMPRESSION: No active cardiopulmonary disease.   Electronically Signed   By: Julien Girt  Pop M.D.   On: 09/23/2013 14:31   Recent Results (from the past 240 hour(s))  CULTURE, BLOOD (SINGLE)     Status: None   Collection Time    09/16/13 10:10 AM      Result Value Range Status   Blood Culture, Routine Culture, Blood   Final   Comment: Final - ===== FINAL REPORT =====NO GROWTH 5 DAYS  URINE CULTURE     Status: None   Collection Time    09/21/13 11:01 AM      Result  Value Range Status   Urine Culture, Routine Culture, Urine   Final   Comment: Final - ===== COLONY COUNT: =====     NO GROWTH     NO GROWTH  CULTURE, BLOOD (ROUTINE X 2)     Status: None   Collection Time    09/23/13  1:36 PM      Result Value Range Status   Specimen Description BLOOD PORTA CATH   Final   Special Requests BOTTLES DRAWN AEROBIC AND ANAEROBIC 4ML   Final   Culture  Setup Time     Final   Value: 09/23/2013 20:25     Performed at [facility]   Culture     Final   Value:        BLOOD CULTURE RECEIVED NO GROWTH TO DATE CULTURE WILL BE HELD FOR 5 DAYS BEFORE ISSUING A FINAL NEGATIVE REPORT     Performed at [facility]   Report Status PENDING   Incomplete  CULTURE, BLOOD (ROUTINE X 2)     Status: None   Collection Time    09/23/13  2:05 PM      Result Value Range Status   Specimen Description BLOOD LEFT HAND   Final   Special Requests BOTTLES DRAWN AEROBIC ONLY 3ML   Final   Culture  Setup Time     Final   Value: 09/23/2013 20:26     Performed at [facility]   Culture     Final   Value:        BLOOD CULTURE RECEIVED NO GROWTH TO DATE CULTURE WILL BE HELD FOR 5 DAYS BEFORE ISSUING A FINAL NEGATIVE REPORT     Performed at [facility]   Report Status PENDING   Incomplete  CULTURE, EXPECTORATED SPUTUM-ASSESSMENT     Status: None   Collection Time    09/23/13  7:59 PM      Result Value Range Status   Specimen Description SPUTUM   Final   Special Requests Immunocompromised   Final   Sputum evaluation     Final   Value: THIS SPECIMEN IS ACCEPTABLE. RESPIRATORY CULTURE REPORT TO FOLLOW.   Report Status 09/23/2013 FINAL   Final  CULTURE, RESPIRATORY (NON-EXPECTORATED)     Status: None   Collection Time    09/23/13  7:59 PM      Result Value Range Status   Specimen Description SPUTUM   Final   Special Requests NONE   Final   Gram Stain     Final   Value: MODERATE WBC PRESENT,BOTH PMN AND MONONUCLEAR     NO SQUAMOUS EPITHELIAL CELLS  SEEN     MODERATE GRAM POSITIVE COCCI IN PAIRS     RARE GRAM NEGATIVE RODS     RARE GRAM POSITIVE RODS   Culture PENDING   Incomplete   Report Status PENDING   Incomplete      09/24/2013, 2:48 PM     LOS:  1 day

## 2013-09-25 ENCOUNTER — Inpatient Hospital Stay (HOSPITAL_COMMUNITY): Payer: Medicare Other

## 2013-09-25 DIAGNOSIS — C50919 Malignant neoplasm of unspecified site of unspecified female breast: Secondary | ICD-10-CM

## 2013-09-25 DIAGNOSIS — R509 Fever, unspecified: Secondary | ICD-10-CM

## 2013-09-25 DIAGNOSIS — N6459 Other signs and symptoms in breast: Secondary | ICD-10-CM

## 2013-09-25 LAB — VANCOMYCIN, TROUGH: VANCOMYCIN TR: 8.5 ug/mL — AB (ref 10.0–20.0)

## 2013-09-25 LAB — GLUCOSE, CAPILLARY: Glucose-Capillary: 130 mg/dL — ABNORMAL HIGH (ref 70–99)

## 2013-09-25 MED ORDER — METHYLPREDNISOLONE SODIUM SUCC 125 MG IJ SOLR
125.0000 mg | Freq: Once | INTRAMUSCULAR | Status: AC
  Administered 2013-09-25: 125 mg via INTRAVENOUS
  Filled 2013-09-25: qty 2

## 2013-09-25 MED ORDER — VANCOMYCIN HCL 500 MG IV SOLR
500.0000 mg | Freq: Three times a day (TID) | INTRAVENOUS | Status: DC
  Administered 2013-09-25 – 2013-09-26 (×3): 500 mg via INTRAVENOUS
  Filled 2013-09-25 (×5): qty 500

## 2013-09-25 MED ORDER — ENSURE PUDDING PO PUDG
1.0000 | Freq: Three times a day (TID) | ORAL | Status: DC
  Administered 2013-09-25 – 2013-09-28 (×8): 1 via ORAL
  Filled 2013-09-25 (×13): qty 1

## 2013-09-25 MED ORDER — DIPHENHYDRAMINE HCL 50 MG/ML IJ SOLN
25.0000 mg | Freq: Four times a day (QID) | INTRAMUSCULAR | Status: DC | PRN
  Administered 2013-09-25: 25 mg via INTRAVENOUS
  Filled 2013-09-25: qty 1

## 2013-09-25 MED ORDER — IOHEXOL 300 MG/ML  SOLN
100.0000 mL | Freq: Once | INTRAMUSCULAR | Status: AC | PRN
  Administered 2013-09-25: 100 mL via INTRAVENOUS

## 2013-09-25 MED ORDER — ACETAMINOPHEN 325 MG PO TABS
650.0000 mg | ORAL_TABLET | Freq: Once | ORAL | Status: AC
  Administered 2013-09-25: 650 mg via ORAL
  Filled 2013-09-25: qty 2

## 2013-09-25 NOTE — Progress Notes (Signed)
ANTIBIOTIC CONSULT NOTE - Follow up  Pharmacy Consult for Vanc, Zosyn Indication: rule out sepsis  Allergies  Allergen Reactions  . Codeine Other (See Comments)    Sees unusual things  . Morphine And Related Nausea Only  . Sulfa Antibiotics Rash    Patient Measurements: Height: 4\' 10"  (147.3 cm) Weight: 109 lb 11.2 oz (49.76 kg) IBW/kg (Calculated) : 40.9   Vital Signs: Temp: 98.3 F (36.8 C) (01/11 1030) Temp src: Oral (01/11 1030) BP: 106/55 mmHg (01/11 1030) Pulse Rate: 99 (01/11 1030) Intake/Output from previous day: 01/10 0701 - 01/11 0700 In: 1217.5 [P.O.:480; I.V.:300; Blood:337.5; IV Piggyback:100] Out: 1650 [Urine:1650] Intake/Output from this shift: Total I/O In: 120 [P.O.:120] Out: 450 [Urine:450]  Labs:  Recent Labs  09/23/13 0940 09/23/13 0941 09/23/13 1345 09/24/13 0548  WBC 13.6*  --  11.1* 9.8  HGB 8.4*  --  7.7* 7.2*  PLT 394  --  368 337  CREATININE  --  0.7 0.46* 0.55   Estimated Creatinine Clearance: 48.6 ml/min (by C-G formula based on Cr of 0.55).  Recent Labs  09/25/13 1345  VANCOTROUGH 8.5*     Microbiology: Recent Results (from the past 720 hour(s))  TECHNOLOGIST REVIEW     Status: None   Collection Time    09/02/13 11:08 AM      Result Value Range Status   Technologist Review     Final   Value: Variant lymphs, rare meta and myelo, 2% nrbcs present  CULTURE, BLOOD (SINGLE)     Status: None   Collection Time    09/16/13 10:10 AM      Result Value Range Status   Blood Culture, Routine Culture, Blood   Final   Comment: Final - ===== FINAL REPORT =====NO GROWTH 5 DAYS  URINE CULTURE     Status: None   Collection Time    09/21/13 11:01 AM      Result Value Range Status   Urine Culture, Routine Culture, Urine   Final   Comment: Final - ===== COLONY COUNT: =====     NO GROWTH     NO GROWTH  CULTURE, BLOOD (ROUTINE X 2)     Status: None   Collection Time    09/23/13  1:36 PM      Result Value Range Status   Specimen  Description BLOOD PORTA CATH   Final   Special Requests BOTTLES DRAWN AEROBIC AND ANAEROBIC 4ML   Final   Culture  Setup Time     Final   Value: 09/23/2013 20:25     Performed at [facility]   Culture     Final   Value:        BLOOD CULTURE RECEIVED NO GROWTH TO DATE CULTURE WILL BE HELD FOR 5 DAYS BEFORE ISSUING A FINAL NEGATIVE REPORT     Performed at [facility]   Report Status PENDING   Incomplete  CULTURE, BLOOD (ROUTINE X 2)     Status: None   Collection Time    09/23/13  2:05 PM      Result Value Range Status   Specimen Description BLOOD LEFT HAND   Final   Special Requests BOTTLES DRAWN AEROBIC ONLY 3ML   Final   Culture  Setup Time     Final   Value: 09/23/2013 20:26     Performed at [facility]   Culture     Final   Value:        BLOOD CULTURE RECEIVED  NO GROWTH TO DATE CULTURE WILL BE HELD FOR 5 DAYS BEFORE ISSUING A FINAL NEGATIVE REPORT     Performed at [facility]   Report Status PENDING   Incomplete  CULTURE, EXPECTORATED SPUTUM-ASSESSMENT     Status: None   Collection Time    09/23/13  7:59 PM      Result Value Range Status   Specimen Description SPUTUM   Final   Special Requests Immunocompromised   Final   Sputum evaluation     Final   Value: THIS SPECIMEN IS ACCEPTABLE. RESPIRATORY CULTURE REPORT TO FOLLOW.   Report Status 09/23/2013 FINAL   Final  CULTURE, RESPIRATORY (NON-EXPECTORATED)     Status: None   Collection Time    09/23/13  7:59 PM      Result Value Range Status   Specimen Description SPUTUM   Final   Special Requests NONE   Final   Gram Stain     Final   Value: MODERATE WBC PRESENT,BOTH PMN AND MONONUCLEAR     NO SQUAMOUS EPITHELIAL CELLS SEEN     MODERATE GRAM POSITIVE COCCI IN PAIRS     RARE GRAM NEGATIVE RODS     RARE GRAM POSITIVE RODS   Culture     Final   Value: NORMAL OROPHARYNGEAL FLORA     Performed at [facility]   Report Status PENDING   Incomplete   Assessment: 68 yo F with stage  IIA carcinoma of left breast on chemotherapy (held for last 2 weeks), presents with 10 day history of FUO. Patient has been on course of amoxicillin 1/2-1/9 for recent diagnosis of sinusitis by oncologist.  Work up from [facility] including blood (1/2) culture, urine (1/7) culture, and CXR (1/7)  all negative.  Pt direct admit from [place] to [place] and pharmacy consulted to dose Vancomycin and Zosyn for FUO.  1/9 >> Vanc >> 1/9 >> Zosyn >>  Tmax: AF WBC: improved to wnl Renal: wnl, 45CG, >100N  1/7 urine: neg 1/9 blood x 2: ngtd 1/9 sputum: NF 1/9 Urinary strep pneumo: neg  1/9 Urinary legionella: neg 1/9 Influenza panel: neg  Vanc trough = 8.5mg /l, below target range on 500mg  q12h.    Goals of Therapy:   Vancomycin trough level 15-20 mcg/ml  Eradicate Infection  Appropriate antibiotic coverage  Plan:   Increase Vanc to 500mg  q8h. Zosyn 3.375g IV Q8H infused over 4hrs.  Measure Vanc trough at steady state.  Follow up renal fxn and culture results.  Romeo Rabon, PharmD, pager [phone]. 09/25/2013,2:44 PM.

## 2013-09-25 NOTE — Progress Notes (Signed)
[facility] PROGRESS NOTE  Ellen Beltran [id number] DOB: 1946-08-02 DOA: 09/23/2013 PCP: Woody Seller, MD  Brief narrative: 68 year old female with past medical history of left invasive ductal breast carcinoma, on chemotherapy (taxol, carboplatin), dyslipidemia, seen today in cancer center and had complaints of ongoing fevers for past week and a half prior tot this admission. She was on outpt amoxicillin for sinusitis and blood cultures, urine culture and CXR done at that time were negative. She was dierctly admitted for cancer center for FUO. Further evaluation included CT chest and neck which did not reveal acute findings to explain left supraclavicular swelling and/or persistent fever.   Assessment and Plan:   Principal Problem:  FUO (fever of unknown origin)  - Blood cultures so far negative; influenza, legionella, strep pneumo all negative  - Tmax in past 24 hours: 100.1 F - consider adding antifungal; will continue vanco and zosyn - may also require removal of port-a-cath  Active Problems:  Breast cancer  - management per oncology  Leukocytosis, unspecified  - related to ongoing fevers  - will continue to monitor CBC  Anemia of chronic disease  - related to history of breast ca and sequela of chemotherapy  - hemoglobin 9.1 on 1/7 but then 8.4 and 7.7 --> 7.2  - given 1 unit PRBC 09/24/2013   Code Status: Full  Family Communication: family at the bedside  Disposition Plan: home when stable   Consultants:  Oncology  Infectious disease  Procedures:  None  Antibiotics:  Vancomycin 09/23/2013 -->  Zosyn 09/23/2013 -->  Leisa Lenz, MD  [facility] Pager [phone]  If 7PM-7AM, please contact night-coverage [email or web address] Password [facility] 09/25/2013, 11:27 AM   LOS: 2 days    HPI/Subjective: No acute overnight events.   Objective: Filed Vitals:   09/24/13 1939 09/24/13 2126 09/25/13 0154 09/25/13 0545  BP:  111/55 111/61 118/62  Pulse:  94 94 110   Temp: 98.9 F (37.2 C) 98.3 F (36.8 C) 98.4 F (36.9 C) 99.1 F (37.3 C)  TempSrc: Oral Oral Oral Oral  Resp:  16 18 18   Height:      Weight:   49.76 kg (109 lb 11.2 oz)   SpO2:  97% 97% 93%    Intake/Output Summary (Last 24 hours) at 09/25/13 1127 Last data filed at 09/24/13 1800  Gross per 24 hour  Intake  977.5 ml  Output   1300 ml  Net -322.5 ml    Exam:   General:  Pt is alert, follows commands appropriately, not in acute distress  Cardiovascular: Regular rate and rhythm, S1/S2 appreciated, left supraclavicular swelling appreciated   Respiratory: Clear to auscultation bilaterally, no wheezing, no crackles, no rhonchi  Abdomen: Soft, non tender, non distended, bowel sounds present, no guarding  Extremities: No edema, pulses DP and PT palpable bilaterally  Neuro: Grossly nonfocal  Data Reviewed: Basic Metabolic Panel:  Recent Labs Lab 09/21/13 1101 09/23/13 0941 09/23/13 1345 09/24/13 0548  NA 139 136 137 139  K 3.4* 3.8 3.5* 3.6*  CL  --   --  95* 99  CO2 28 26 26 28   GLUCOSE 172* 172* 95 99  BUN 10.8 11.1 10 11   CREATININE 0.7 0.7 0.46* 0.55  CALCIUM 9.7 9.7 8.9 8.6  MG  --   --  1.9  --   PHOS  --   --  3.7  --    Liver Function Tests:  Recent Labs Lab 09/21/13 1101 09/23/13 0941 09/23/13 1345 09/24/13 0548  AST 73* 56* 68* 57*  ALT 137* 123* 121* 100*  ALKPHOS 87 76 73 61  BILITOT 0.26 0.28 0.3 0.2*  PROT 6.9 6.5 6.4 5.5*  ALBUMIN 2.3* 2.1* 2.2* 2.0*   No results found for this basename: LIPASE, AMYLASE,  in the last 168 hours No results found for this basename: AMMONIA,  in the last 168 hours CBC:  Recent Labs Lab 09/21/13 1101 09/23/13 0940 09/23/13 1345 09/24/13 0548  WBC 11.4* 13.6* 11.1* 9.8  NEUTROABS 6.5 8.7* 6.1  --   HGB 9.1* 8.4* 7.7* 7.2*  HCT 27.0* 25.3* 23.6* 22.9*  MCV 89.2 89.2 89.4 91.2  PLT 344 394 368 337   Cardiac Enzymes: No results found for this basename: CKTOTAL, CKMB, CKMBINDEX, TROPONINI,  in  the last 168 hours BNP: No components found with this basename: POCBNP,  CBG:  Recent Labs Lab 09/24/13 0751 09/25/13 0747  GLUCAP 88 130*    Recent Results (from the past 240 hour(s))  CULTURE, BLOOD (SINGLE)     Status: None   Collection Time    09/16/13 10:10 AM      Result Value Range Status   Blood Culture, Routine Culture, Blood   Final   Comment: Final - ===== FINAL REPORT =====NO GROWTH 5 DAYS  URINE CULTURE     Status: None   Collection Time    09/21/13 11:01 AM      Result Value Range Status   Urine Culture, Routine Culture, Urine   Final   Comment: Final - ===== COLONY COUNT: =====     NO GROWTH     NO GROWTH  CULTURE, BLOOD (ROUTINE X 2)     Status: None   Collection Time    09/23/13  1:36 PM      Result Value Range Status   Specimen Description BLOOD PORTA CATH   Final   Special Requests BOTTLES DRAWN AEROBIC AND ANAEROBIC 4ML   Final   Culture  Setup Time     Final   Value: 09/23/2013 20:25     Performed at [facility]   Culture     Final   Value:        BLOOD CULTURE RECEIVED NO GROWTH TO DATE CULTURE WILL BE HELD FOR 5 DAYS BEFORE ISSUING A FINAL NEGATIVE REPORT     Performed at [facility]   Report Status PENDING   Incomplete  CULTURE, BLOOD (ROUTINE X 2)     Status: None   Collection Time    09/23/13  2:05 PM      Result Value Range Status   Specimen Description BLOOD LEFT HAND   Final   Special Requests BOTTLES DRAWN AEROBIC ONLY 3ML   Final   Culture  Setup Time     Final   Value: 09/23/2013 20:26     Performed at [facility]   Culture     Final   Value:        BLOOD CULTURE RECEIVED NO GROWTH TO DATE CULTURE WILL BE HELD FOR 5 DAYS BEFORE ISSUING A FINAL NEGATIVE REPORT     Performed at [facility]   Report Status PENDING   Incomplete  CULTURE, EXPECTORATED SPUTUM-ASSESSMENT     Status: None   Collection Time    09/23/13  7:59 PM      Result Value Range Status   Specimen Description SPUTUM   Final    Special Requests Immunocompromised   Final   Sputum evaluation  Final   Value: THIS SPECIMEN IS ACCEPTABLE. RESPIRATORY CULTURE REPORT TO FOLLOW.   Report Status 09/23/2013 FINAL   Final  CULTURE, RESPIRATORY (NON-EXPECTORATED)     Status: None   Collection Time    09/23/13  7:59 PM      Result Value Range Status   Specimen Description SPUTUM   Final   Special Requests NONE   Final   Gram Stain     Final   Value: MODERATE WBC PRESENT,BOTH PMN AND MONONUCLEAR     NO SQUAMOUS EPITHELIAL CELLS SEEN     MODERATE GRAM POSITIVE COCCI IN PAIRS     RARE GRAM NEGATIVE RODS     RARE GRAM POSITIVE RODS   Culture     Final   Value: NORMAL OROPHARYNGEAL FLORA     Performed at [facility]   Report Status PENDING   Incomplete     Studies: Dg Chest 2 View  09/23/2013   CLINICAL DATA:  Fever, breast cancer on chemotherapy  EXAM: CHEST  2 VIEW  COMPARISON:  09/21/2013  FINDINGS: Cardiomediastinal silhouette is stable. Mild thoracic dextroscoliosis again noted. Stable right subclavian Port-A-Cath position. No acute infiltrate or pulmonary edema. Mild degenerative changes mid thoracic spine.  IMPRESSION: No active cardiopulmonary disease.   Electronically Signed   By: Lahoma Crocker M.D.   On: 09/23/2013 14:31   Ct Soft Tissue Neck W Contrast  09/25/2013   CLINICAL DATA:  Supraclavicular fossa swelling.  Fewer.  EXAM: CT NECK WITH CONTRAST  TECHNIQUE: Multidetector CT imaging of the neck was performed using the standard protocol following the bolus administration of intravenous contrast.  CONTRAST:  163mL OMNIPAQUE IOHEXOL 300 MG/ML  SOLN  COMPARISON:  Visualized intracranial contents are unremarkable. Both parotid glands are normal. Submandibular glands are normal. The thyroid gland is normal.  Arterial and venous structures in the neck appear unremarkable. No sign of jugular vein thrombosis. No carotid bifurcation atherosclerosis of significance.  There are no enlarged lymph nodes on either side of  the neck.  No mucosal or submucosal lesion is seen.  No significant bony finding.  No supraclavicular lesion seen on either side. Patient has an indwelling port on the right without apparent complication discernible on this exam.  FINDINGS: Negative CT scan of the neck. No cause of supraclavicular swelling identified. Port catheter in place on the right without discernible complication on this portion of the exam.   Electronically Signed   By: Nelson Chimes M.D.   On: 09/25/2013 08:39   Ct Chest W Contrast  09/25/2013   CLINICAL DATA:  Fever.  Supraclavicular swelling.  EXAM: CT CHEST WITH CONTRAST  TECHNIQUE: Multidetector CT imaging of the chest was performed during intravenous contrast administration.  CONTRAST:  14mL OMNIPAQUE IOHEXOL 300 MG/ML  SOLN  COMPARISON:  CT neck same day.  CT chest 09/09/2013  FINDINGS: There is no pleural or pericardial fluid. There are no enlarged lymph nodes in the supraclavicular regions. No axillary lymphadenopathy. Mediastinal lymph nodes are within normal limits. Vascular structures of the mediastinum are unremarkable. Subclavian catheter on the right appears grossly well positioned. The tip does appear to enter the right atrium. There is benign appearing pleural and parenchymal scarring, predominantly in the upper lobes. No focal pulmonary mass. There is curvature of the spine with ordinary degenerative disc disease. Scans in the upper abdomen do not show any significant lesion.  IMPRESSION: No cause of fever or supraclavicular swelling is identified. Chronic pleural and parenchymal scarring.  Catheter tip  does appear to terminate in the right atrium.   Electronically Signed   By: Nelson Chimes M.D.   On: 09/25/2013 08:44    Scheduled Meds: . gabapentin  100 mg Oral TID  . piperacillin-tazobactam (ZOSYN)  IV  3.375 g Intravenous Q8H  . simvastatin  40 mg Oral QPM  . vancomycin  500 mg Intravenous Q12H   Continuous Infusions: . sodium chloride 1,000 mL (09/24/13  0240)

## 2013-09-25 NOTE — Progress Notes (Addendum)
INFECTIOUS DISEASE PROGRESS NOTE  ID: Ellen Beltran is a 68 y.o. female with  Principal Problem:   FUO (fever of unknown origin) Active Problems:   Cancer of central portion of female breast, Left.  T2, N0.   Leukocytosis, unspecified   Anemia of chronic disease  Subjective: Without complaints.   Abtx:  Anti-infectives   Start     Dose/Rate Route Frequency Ordered Stop   09/24/13 0300  vancomycin (VANCOCIN) 500 mg in sodium chloride 0.9 % 100 mL IVPB     500 mg 100 mL/hr over 60 Minutes Intravenous Every 12 hours 09/23/13 2013     09/23/13 2200  piperacillin-tazobactam (ZOSYN) IVPB 3.375 g     3.375 g 12.5 mL/hr over 240 Minutes Intravenous 3 times per day 09/23/13 2013     09/23/13 1330  vancomycin (VANCOCIN) IVPB 1000 mg/200 mL premix     1,000 mg 200 mL/hr over 60 Minutes Intravenous STAT 09/23/13 1314 09/23/13 1626   09/23/13 1330  piperacillin-tazobactam (ZOSYN) IVPB 3.375 g     3.375 g 100 mL/hr over 30 Minutes Intravenous STAT 09/23/13 1314 09/23/13 1526      Medications:  Scheduled: . feeding supplement (ENSURE)  1 Container Oral TID BM  . gabapentin  100 mg Oral TID  . piperacillin-tazobactam (ZOSYN)  IV  3.375 g Intravenous Q8H  . simvastatin  40 mg Oral QPM  . vancomycin  500 mg Intravenous Q12H    Objective: Vital signs in last 24 hours: Temp:  [98.2 F (36.8 C)-100.1 F (37.8 C)] 98.3 F (36.8 C) (01/11 1030) Pulse Rate:  [94-110] 99 (01/11 1030) Resp:  [16-18] 18 (01/11 1030) BP: (106-118)/(55-62) 106/55 mmHg (01/11 1030) SpO2:  [93 %-100 %] 100 % (01/11 1030) Weight:  [49.76 kg (109 lb 11.2 oz)] 49.76 kg (109 lb 11.2 oz) (01/11 0154)   Head: mild-mod facial erythema swelling.  Resp: clear to auscultation bilaterally Chest wall: port site is clean, non-tender, no fluctuance.  Cardio: regular rate and rhythm GI: normal findings: bowel sounds normal and soft, non-tender Extremities: no calf tenderness or cordis.   Lab Results  Recent  Labs  09/23/13 1345 09/24/13 0548  WBC 11.1* 9.8  HGB 7.7* 7.2*  HCT 23.6* 22.9*  NA 137 139  K 3.5* 3.6*  CL 95* 99  CO2 26 28  BUN 10 11  CREATININE 0.46* 0.55   Liver Panel  Recent Labs  09/23/13 1345 09/24/13 0548  PROT 6.4 5.5*  ALBUMIN 2.2* 2.0*  AST 68* 57*  ALT 121* 100*  ALKPHOS 73 61  BILITOT 0.3 0.2*   Sedimentation Rate No results found for this basename: ESRSEDRATE,  in the last 72 hours C-Reactive Protein No results found for this basename: CRP,  in the last 72 hours  Microbiology: Recent Results (from the past 240 hour(s))  CULTURE, BLOOD (SINGLE)     Status: None   Collection Time    09/16/13 10:10 AM      Result Value Range Status   Blood Culture, Routine Culture, Blood   Final   Comment: Final - ===== FINAL REPORT =====NO GROWTH 5 DAYS  URINE CULTURE     Status: None   Collection Time    09/21/13 11:01 AM      Result Value Range Status   Urine Culture, Routine Culture, Urine   Final   Comment: Final - ===== COLONY COUNT: =====     NO GROWTH     NO GROWTH  CULTURE, BLOOD (ROUTINE X  2)     Status: None   Collection Time    09/23/13  1:36 PM      Result Value Range Status   Specimen Description BLOOD PORTA CATH   Final   Special Requests BOTTLES DRAWN AEROBIC AND ANAEROBIC 4ML   Final   Culture  Setup Time     Final   Value: 09/23/2013 20:25     Performed at [facility]   Culture     Final   Value:        BLOOD CULTURE RECEIVED NO GROWTH TO DATE CULTURE WILL BE HELD FOR 5 DAYS BEFORE ISSUING A FINAL NEGATIVE REPORT     Performed at [facility]   Report Status PENDING   Incomplete  CULTURE, BLOOD (ROUTINE X 2)     Status: None   Collection Time    09/23/13  2:05 PM      Result Value Range Status   Specimen Description BLOOD LEFT HAND   Final   Special Requests BOTTLES DRAWN AEROBIC ONLY 3ML   Final   Culture  Setup Time     Final   Value: 09/23/2013 20:26     Performed at [facility]   Culture     Final    Value:        BLOOD CULTURE RECEIVED NO GROWTH TO DATE CULTURE WILL BE HELD FOR 5 DAYS BEFORE ISSUING A FINAL NEGATIVE REPORT     Performed at [facility]   Report Status PENDING   Incomplete  CULTURE, EXPECTORATED SPUTUM-ASSESSMENT     Status: None   Collection Time    09/23/13  7:59 PM      Result Value Range Status   Specimen Description SPUTUM   Final   Special Requests Immunocompromised   Final   Sputum evaluation     Final   Value: THIS SPECIMEN IS ACCEPTABLE. RESPIRATORY CULTURE REPORT TO FOLLOW.   Report Status 09/23/2013 FINAL   Final  CULTURE, RESPIRATORY (NON-EXPECTORATED)     Status: None   Collection Time    09/23/13  7:59 PM      Result Value Range Status   Specimen Description SPUTUM   Final   Special Requests NONE   Final   Gram Stain     Final   Value: MODERATE WBC PRESENT,BOTH PMN AND MONONUCLEAR     NO SQUAMOUS EPITHELIAL CELLS SEEN     MODERATE GRAM POSITIVE COCCI IN PAIRS     RARE GRAM NEGATIVE RODS     RARE GRAM POSITIVE RODS   Culture     Final   Value: NORMAL OROPHARYNGEAL FLORA     Performed at [facility]   Report Status PENDING   Incomplete    Studies/Results: Ct Soft Tissue Neck W Contrast  09/25/2013   CLINICAL DATA:  Supraclavicular fossa swelling.  Fewer.  EXAM: CT NECK WITH CONTRAST  TECHNIQUE: Multidetector CT imaging of the neck was performed using the standard protocol following the bolus administration of intravenous contrast.  CONTRAST:  137mL OMNIPAQUE IOHEXOL 300 MG/ML  SOLN  COMPARISON:  Visualized intracranial contents are unremarkable. Both parotid glands are normal. Submandibular glands are normal. The thyroid gland is normal.  Arterial and venous structures in the neck appear unremarkable. No sign of jugular vein thrombosis. No carotid bifurcation atherosclerosis of significance.  There are no enlarged lymph nodes on either side of the neck.  No mucosal or submucosal lesion is seen.  No significant bony finding.  No  supraclavicular lesion seen on either side. Patient has an indwelling port on the right without apparent complication discernible on this exam.  FINDINGS: Negative CT scan of the neck. No cause of supraclavicular swelling identified. Port catheter in place on the right without discernible complication on this portion of the exam.   Electronically Signed   By: Nelson Chimes M.D.   On: 09/25/2013 08:39   Ct Chest W Contrast  09/25/2013   CLINICAL DATA:  Fever.  Supraclavicular swelling.  EXAM: CT CHEST WITH CONTRAST  TECHNIQUE: Multidetector CT imaging of the chest was performed during intravenous contrast administration.  CONTRAST:  138mL OMNIPAQUE IOHEXOL 300 MG/ML  SOLN  COMPARISON:  CT neck same day.  CT chest 09/09/2013  FINDINGS: There is no pleural or pericardial fluid. There are no enlarged lymph nodes in the supraclavicular regions. No axillary lymphadenopathy. Mediastinal lymph nodes are within normal limits. Vascular structures of the mediastinum are unremarkable. Subclavian catheter on the right appears grossly well positioned. The tip does appear to enter the right atrium. There is benign appearing pleural and parenchymal scarring, predominantly in the upper lobes. No focal pulmonary mass. There is curvature of the spine with ordinary degenerative disc disease. Scans in the upper abdomen do not show any significant lesion.  IMPRESSION: No cause of fever or supraclavicular swelling is identified. Chronic pleural and parenchymal scarring.  Catheter tip does appear to terminate in the right atrium.   Electronically Signed   By: Nelson Chimes M.D.   On: 09/25/2013 08:44     Assessment/Plan: Fever Breast Cancer R supraclavicular induration  CT chest and neck do not show source Her BCx are ngtd. Her UCx is (-).  Certainly her port could be a source but would wait for BCx+ before removing.  Consider CT abd/pelvis, doppler studies if worsened fever, so far improved.  Watch her facial  swelling/erythema (per family present since she started steroids after CTX).   Total days of antibiotics: 2 (vanco/zosyn)         Bobby Rumpf Infectious Diseases (pager) [phone] [email or web address] 09/25/2013, 2:42 PM  LOS: 2 days

## 2013-09-25 NOTE — Progress Notes (Signed)
INITIAL NUTRITION ASSESSMENT  DOCUMENTATION CODES Per approved criteria  -Not Applicable   INTERVENTION: Ensure Pudding po TID, each supplement provides 170 kcal and 4 grams of protein.  NUTRITION DIAGNOSIS: Increased nutrient needs related to breast cancer treatments as evidenced by fever.   Goal: Pt to meet >/= 90% of their estimated nutrition needs   Monitor:  Wt, po intake, acceptance of supplements, labs  Reason for Assessment: Consult  68 y.o. female  Admitting Dx: FUO (fever of unknown origin)  ASSESSMENT: 68 year old female with past medical history of left invasive ductal breast carcinoma, on chemotherapy (taxol, carboplatin), dyslipidemia, seen today in cancer center and had complaints of ongoing fevers for past week and a half prior tot this admission.  Pt reports that she has not experienced any significant weight changes and that her appetite has been good. Pt's meal completion is 100%. She was reluctant to accept any nutritional supplements, but RD explained that her needs are increased from fever and chemotherapy treatments. She was educated on the importance of maintaining her weight during treatment. Pt agreed to try ensure pudding.  Height: Ht Readings from Last 1 Encounters:  09/23/13 4\' 10"  (1.473 m)    Weight: Wt Readings from Last 1 Encounters:  09/25/13 109 lb 11.2 oz (49.76 kg)    Ideal Body Weight: 40.9 kg  % Ideal Body Weight: 122%  Wt Readings from Last 10 Encounters:  09/25/13 109 lb 11.2 oz (49.76 kg)  09/23/13 105 lb 8 oz (47.854 kg)  09/16/13 103 lb 7 oz (46.919 kg)  09/09/13 102 lb 3.2 oz (46.358 kg)  09/02/13 102 lb (46.267 kg)  08/26/13 102 lb 11.2 oz (46.584 kg)  08/19/13 103 lb 4.8 oz (46.857 kg)  08/12/13 104 lb 9.6 oz (47.446 kg)  08/05/13 104 lb 1.6 oz (47.219 kg)  08/03/13 107 lb 3.2 oz (48.626 kg)    Usual Body Weight: 104-105 lbs  % Usual Body Weight: 103%  BMI:  Body mass index is 22.93 kg/(m^2).  Estimated  Nutritional Needs: Kcal: 1500-1750 Protein: 65-75 g/day Fluid: 1.5-1.8 L/day  Skin: incision on chest  Diet Order: General  EDUCATION NEEDS: -Education needs addressed   Intake/Output Summary (Last 24 hours) at 09/25/13 1316 Last data filed at 09/25/13 0900  Gross per 24 hour  Intake    845 ml  Output   1050 ml  Net   -205 ml    Last BM: none recorded   Labs:   Recent Labs Lab 09/23/13 0941 09/23/13 1345 09/24/13 0548  NA 136 137 139  K 3.8 3.5* 3.6*  CL  --  95* 99  CO2 26 26 28   BUN 11.1 10 11   CREATININE 0.7 0.46* 0.55  CALCIUM 9.7 8.9 8.6  MG  --  1.9  --   PHOS  --  3.7  --   GLUCOSE 172* 95 99    CBG (last 3)   Recent Labs  09/24/13 0751 09/25/13 0747  GLUCAP 88 130*    Scheduled Meds: . gabapentin  100 mg Oral TID  . piperacillin-tazobactam (ZOSYN)  IV  3.375 g Intravenous Q8H  . simvastatin  40 mg Oral QPM  . vancomycin  500 mg Intravenous Q12H    Continuous Infusions: . sodium chloride 1,000 mL (09/24/13 0240)    Past Medical History  Diagnosis Date  . Hypertension   . Hyperlipidemia   . Osteopenia   . Arthritis   . Hot flashes   . Cancer  left breast    Past Surgical History  Procedure Laterality Date  . Cardiac catheterization  2004  . Tubal ligation  age 15  . Total abdominal hysterectomy  age 71 or 68  . Breast biopsy Left aug 2014  . Portacath placement N/A 05/02/2013    Procedure: INSERTION PORT-A-CATH;  Surgeon: Shann Medal, MD;  Location: [facility];  Service: General;  Laterality: N/A;    Terrace Arabia RD, LDN

## 2013-09-25 NOTE — Progress Notes (Signed)
When RN went into room pt. Stated that she was feeling flushed.  Pt. Appeared to be have a reddened face, arms, and legs and was warm to the touch.  Pt. VS were stable and pt. Had no fever. Pt. Had not taken any new medications or eaten anything unusual.  MD was notified and new orders were given. Will continue to monitor.

## 2013-09-25 NOTE — Evaluation (Signed)
Physical Therapy Evaluation Patient Details Name: Ellen Beltran MRN: [id number] DOB: November 10, 1945 Today's Date: 09/25/2013 Time: 3845-3646 PT Time Calculation (min): 23 min  PT Assessment / Plan / Recommendation History of Present Illness  68 year old female with past medical history of left invasive ductal breast carcinoma, on chemotherapy (taxol, carboplatin), dyslipidemia, seen today in cancer center and had complaints of ongoing fevers for past week and a half prior tot this admission. She has had associated upper respiratory tract symptoms with nasal drainage and occasional shortness of breath. She was on outpt amoxicillin for sinusitis and blood cultures, urine culture and CXR done at that time were negative. She continue to spike fever at home with highest temp slightly above 102F. She was sent from cancer center for evaluation of ongoing fevers. At this time she has no major complaints. This was a direct admission so ED events/blood work evaluation/diagnostic studies were not done at the time of the admission.  Clinical Impression  Pt with general weakness of hospitalization, however pretty steady on feet and pt /husband agree no further PT services need here and at the house. Feel they will progress on their own. Educated on how to progress at home, and encouraged pt to take a walk at least 2x a day with nursing staff in hallway or husband. Pt reported her neuropathy "funny tingly feeling" in both her feet and fingers. Educated on fine motor exercise to do to keep these activity as possible. She is aware and re demonstrated these. Also educated that if it does begin to bother her balance to affect stability to not hesitate to good to our OP facility that specializes in seeing patients with Chemo induced neuropathy.     PT Assessment  Patent does not need any further PT services    Follow Up Recommendations  No PT follow up    Does the patient have the potential to tolerate intense  rehabilitation      Barriers to Discharge        Equipment Recommendations  None recommended by PT    Recommendations for Other Services     Frequency      Precautions / Restrictions     Pertinent Vitals/Pain No pain      Mobility  Bed Mobility Overal bed mobility: Independent Transfers Overall transfer level: Independent Ambulation/Gait Ambulation/Gait assistance: Modified independent (Device/Increase time) Ambulation Distance (Feet): 40 Feet Assistive device: Rolling walker (2 wheeled) Gait Pattern/deviations: WFL(Within Functional Limits) General Gait Details: Pt wanted to walk just in the room. Started with RW , however seemed to be more int eh way and pt did not need it for balance at this time. Pt pushed IV pole for next rounds in rom and did fine with abrut stops, turns, bending etc.     Exercises     PT Diagnosis:    PT Problem List:   PT Treatment Interventions:       PT Goals(Current goals can be found in the care plan section) Acute Rehab PT Goals PT Goal Formulation: No goals set, d/c therapy  Visit Information  Last PT Received On: 09/25/13 Assistance Needed: +1 History of Present Illness: 68 year old female with past medical history of left invasive ductal breast carcinoma, on chemotherapy (taxol, carboplatin), dyslipidemia, seen today in cancer center and had complaints of ongoing fevers for past week and a half prior tot this admission. She has had associated upper respiratory tract symptoms with nasal drainage and occasional shortness of breath. She was on  outpt amoxicillin for sinusitis and blood cultures, urine culture and CXR done at that time were negative. She continue to spike fever at home with highest temp slightly above 102F. She was sent from cancer center for evaluation of ongoing fevers. At this time she has no major complaints. This was a direct admission so ED events/blood work evaluation/diagnostic studies were not done at the time of the  admission.       Prior [place] expects to be discharged to:: Private residence Living Arrangements: Spouse/significant other Available Help at Discharge: Family Home Access: Stairs to enter Technical brewer of Steps: 2 Home Layout: One Wolverine - single point;Shower seat;Walker - standard Additional Comments: assessed to see if pt would need RW for home use and do NOT think she needs one at this time. Pt and husband agree.  Prior Function Level of Independence: Independent Comments: I with simple ADL activity Communication Communication: No difficulties    Cognition  Cognition Arousal/Alertness: Awake/alert Behavior During Therapy: WFL for tasks assessed/performed Overall Cognitive Status: Within Functional Limits for tasks assessed    Extremity/Trunk Assessment Lower Extremity Assessment Lower Extremity Assessment: Overall WFL for tasks assessed   Balance    End of Session PT - End of Session Equipment Utilized During Treatment: Gait belt Activity Tolerance: Patient tolerated treatment well Patient left: in bed;with family/visitor present  GP     Clide Dales 09/25/2013, 1:54 PM Clide Dales, PT Pager: [phone] 09/25/2013

## 2013-09-26 DIAGNOSIS — R220 Localized swelling, mass and lump, head: Secondary | ICD-10-CM

## 2013-09-26 DIAGNOSIS — R221 Localized swelling, mass and lump, neck: Secondary | ICD-10-CM

## 2013-09-26 LAB — CULTURE, RESPIRATORY: CULTURE: NORMAL

## 2013-09-26 LAB — GLUCOSE, CAPILLARY: Glucose-Capillary: 302 mg/dL — ABNORMAL HIGH (ref 70–99)

## 2013-09-26 LAB — TYPE AND SCREEN
ABO/RH(D): O POS
ANTIBODY SCREEN: NEGATIVE
Unit division: 0

## 2013-09-26 LAB — CULTURE, RESPIRATORY W GRAM STAIN

## 2013-09-26 NOTE — Progress Notes (Signed)
[facility] for Infectious Disease  Date of Admission:  09/23/2013  Antibiotics: Antibiotics Given (last 72 hours)   Date/Time Action Medication Dose Rate   09/23/13 2214 Given   piperacillin-tazobactam (ZOSYN) IVPB 3.375 g 3.375 g 12.5 mL/hr   09/24/13 0300 Given   vancomycin (VANCOCIN) 500 mg in sodium chloride 0.9 % 100 mL IVPB 500 mg 100 mL/hr   09/24/13 0541 Given   piperacillin-tazobactam (ZOSYN) IVPB 3.375 g 3.375 g 12.5 mL/hr   09/24/13 1452 Given   piperacillin-tazobactam (ZOSYN) IVPB 3.375 g 3.375 g 12.5 mL/hr   09/24/13 1452 Given   vancomycin (VANCOCIN) 500 mg in sodium chloride 0.9 % 100 mL IVPB 500 mg 100 mL/hr   09/24/13 2129 Given   piperacillin-tazobactam (ZOSYN) IVPB 3.375 g 3.375 g 12.5 mL/hr   09/25/13 0304 Given   vancomycin (VANCOCIN) 500 mg in sodium chloride 0.9 % 100 mL IVPB 500 mg 100 mL/hr   09/25/13 0557 Given   piperacillin-tazobactam (ZOSYN) IVPB 3.375 g 3.375 g 12.5 mL/hr   09/25/13 1343 Given   piperacillin-tazobactam (ZOSYN) IVPB 3.375 g 3.375 g 12.5 mL/hr   09/25/13 1510 Given   vancomycin (VANCOCIN) 500 mg in sodium chloride 0.9 % 100 mL IVPB 500 mg 100 mL/hr   09/25/13 2131 Given   piperacillin-tazobactam (ZOSYN) IVPB 3.375 g 3.375 g 12.5 mL/hr   09/26/13 0043 Given   vancomycin (VANCOCIN) 500 mg in sodium chloride 0.9 % 100 mL IVPB 500 mg 100 mL/hr   09/26/13 0522 Given   piperacillin-tazobactam (ZOSYN) IVPB 3.375 g 3.375 g 12.5 mL/hr   09/26/13 0818 Given   vancomycin (VANCOCIN) 500 mg in sodium chloride 0.9 % 100 mL IVPB 500 mg 100 mL/hr      Subjective: Feels ok, facial swelling a bit better, no fever  Objective: Temp:  [97.5 F (36.4 C)-98.8 F (37.1 C)] 97.6 F (36.4 C) (01/12 0952) Pulse Rate:  [74-98] 74 (01/12 0952) Resp:  [14-18] 16 (01/12 0952) BP: (105-121)/(49-66) 120/49 mmHg (01/12 0952) SpO2:  [94 %-98 %] 94 % (01/12 1113)  General: awake, in chair eating Skin: no rashes, facial erythema R>L on cheeks Lungs:  CTA B Cor: RRR Abdomen: soft, nt, nd Ext: no edema  Lab Results Lab Results  Component Value Date   WBC 9.8 09/24/2013   HGB 7.2* 09/24/2013   HCT 22.9* 09/24/2013   MCV 91.2 09/24/2013   PLT 337 09/24/2013    Lab Results  Component Value Date   CREATININE 0.55 09/24/2013   BUN 11 09/24/2013   NA 139 09/24/2013   K 3.6* 09/24/2013   CL 99 09/24/2013   CO2 28 09/24/2013    Lab Results  Component Value Date   ALT 100* 09/24/2013   AST 57* 09/24/2013   ALKPHOS 61 09/24/2013   BILITOT 0.2* 09/24/2013      Microbiology: Recent Results (from the past 240 hour(s))  URINE CULTURE     Status: None   Collection Time    09/21/13 11:01 AM      Result Value Range Status   Urine Culture, Routine Culture, Urine   Final   Comment: Final - ===== COLONY COUNT: =====     NO GROWTH     NO GROWTH  CULTURE, BLOOD (ROUTINE X 2)     Status: None   Collection Time    09/23/13  1:36 PM      Result Value Range Status   Specimen Description BLOOD PORTA CATH   Final   Special Requests  BOTTLES DRAWN AEROBIC AND ANAEROBIC 4ML   Final   Culture  Setup Time     Final   Value: 09/23/2013 20:25     Performed at [facility]   Culture     Final   Value:        BLOOD CULTURE RECEIVED NO GROWTH TO DATE CULTURE WILL BE HELD FOR 5 DAYS BEFORE ISSUING A FINAL NEGATIVE REPORT     Performed at [facility]   Report Status PENDING   Incomplete  CULTURE, BLOOD (ROUTINE X 2)     Status: None   Collection Time    09/23/13  2:05 PM      Result Value Range Status   Specimen Description BLOOD LEFT HAND   Final   Special Requests BOTTLES DRAWN AEROBIC ONLY 3ML   Final   Culture  Setup Time     Final   Value: 09/23/2013 20:26     Performed at [facility]   Culture     Final   Value:        BLOOD CULTURE RECEIVED NO GROWTH TO DATE CULTURE WILL BE HELD FOR 5 DAYS BEFORE ISSUING A FINAL NEGATIVE REPORT     Performed at [facility]   Report Status PENDING   Incomplete  CULTURE,  EXPECTORATED SPUTUM-ASSESSMENT     Status: None   Collection Time    09/23/13  7:59 PM      Result Value Range Status   Specimen Description SPUTUM   Final   Special Requests Immunocompromised   Final   Sputum evaluation     Final   Value: THIS SPECIMEN IS ACCEPTABLE. RESPIRATORY CULTURE REPORT TO FOLLOW.   Report Status 09/23/2013 FINAL   Final  CULTURE, RESPIRATORY (NON-EXPECTORATED)     Status: None   Collection Time    09/23/13  7:59 PM      Result Value Range Status   Specimen Description SPUTUM   Final   Special Requests NONE   Final   Gram Stain     Final   Value: MODERATE WBC PRESENT,BOTH PMN AND MONONUCLEAR     NO SQUAMOUS EPITHELIAL CELLS SEEN     MODERATE GRAM POSITIVE COCCI IN PAIRS     RARE GRAM NEGATIVE RODS     RARE GRAM POSITIVE RODS   Culture     Final   Value: NORMAL OROPHARYNGEAL FLORA     Performed at [facility]   Report Status 09/26/2013 FINAL   Final    Studies/Results: Ct Soft Tissue Neck W Contrast  09/25/2013   CLINICAL DATA:  Supraclavicular fossa swelling.  Fewer.  EXAM: CT NECK WITH CONTRAST  TECHNIQUE: Multidetector CT imaging of the neck was performed using the standard protocol following the bolus administration of intravenous contrast.  CONTRAST:  165mL OMNIPAQUE IOHEXOL 300 MG/ML  SOLN  COMPARISON:  Visualized intracranial contents are unremarkable. Both parotid glands are normal. Submandibular glands are normal. The thyroid gland is normal.  Arterial and venous structures in the neck appear unremarkable. No sign of jugular vein thrombosis. No carotid bifurcation atherosclerosis of significance.  There are no enlarged lymph nodes on either side of the neck.  No mucosal or submucosal lesion is seen.  No significant bony finding.  No supraclavicular lesion seen on either side. Patient has an indwelling port on the right without apparent complication discernible on this exam.  FINDINGS: Negative CT scan of the neck. No cause of supraclavicular  swelling identified. Port catheter  in place on the right without discernible complication on this portion of the exam.   Electronically Signed   By: Nelson Chimes M.D.   On: 09/25/2013 08:39   Ct Chest W Contrast  09/25/2013   CLINICAL DATA:  Fever.  Supraclavicular swelling.  EXAM: CT CHEST WITH CONTRAST  TECHNIQUE: Multidetector CT imaging of the chest was performed during intravenous contrast administration.  CONTRAST:  163mL OMNIPAQUE IOHEXOL 300 MG/ML  SOLN  COMPARISON:  CT neck same day.  CT chest 09/09/2013  FINDINGS: There is no pleural or pericardial fluid. There are no enlarged lymph nodes in the supraclavicular regions. No axillary lymphadenopathy. Mediastinal lymph nodes are within normal limits. Vascular structures of the mediastinum are unremarkable. Subclavian catheter on the right appears grossly well positioned. The tip does appear to enter the right atrium. There is benign appearing pleural and parenchymal scarring, predominantly in the upper lobes. No focal pulmonary mass. There is curvature of the spine with ordinary degenerative disc disease. Scans in the upper abdomen do not show any significant lesion.  IMPRESSION: No cause of fever or supraclavicular swelling is identified. Chronic pleural and parenchymal scarring.  Catheter tip does appear to terminate in the right atrium.   Electronically Signed   By: Nelson Chimes M.D.   On: 09/25/2013 08:44    Assessment/Plan: 1)  FUO -  No new symptoms, no positive cultures.  Not neutropenic.  Last fever 1/9, 1/10 with temp to 100.1.  No chills.  -at this point, with no positive cultures, viral illness possible.  I will stop antibiotics and observe off antibiotics.   -restart if she spikes another fever over 100.4 or new concerns.   Scharlene Gloss, [facility] [place] [email or web address] [phone] pager   [phone] cell 09/26/2013, 12:44 PM

## 2013-09-26 NOTE — Progress Notes (Signed)
Occupational Therapy Treatment Patient Details Name: Ellen Beltran MRN: [id number] DOB: 1945/11/13 Today's Date: 09/26/2013 Time: 1497-0263 OT Time Calculation (min): 42 min  OT Assessment / Plan / Recommendation  History of present illness 68 year old female with past medical history of left invasive ductal breast carcinoma, on chemotherapy (taxol, carboplatin), dyslipidemia, seen today in cancer center and had complaints of ongoing fevers for past week and a half prior tot this admission. She has had associated upper respiratory tract symptoms with nasal drainage and occasional shortness of breath. She was on outpt amoxicillin for sinusitis and blood cultures, urine culture and CXR done at that time were negative. She continue to spike fever at home with highest temp slightly above 102F. She was sent from cancer center for evaluation of ongoing fevers. At this time she has no major complaints. This was a direct admission so ED events/blood work evaluation/diagnostic studies were not done at the time of the admission.   OT comments  Added goal for hand exercise  Follow Up Recommendations  Home health OT       Equipment Recommendations  None recommended by OT       Frequency Min 2X/week      Plan Discharge plan remains appropriate    Precautions / Restrictions Precautions Precautions: Fall Restrictions Weight Bearing Restrictions: No       ADL  Lower Body Dressing: Supervision/safety Where Assessed - Lower Body Dressing: Unsupported sit to stand Toilet Transfer: Supervision/safety Toilet Transfer Method: Sit to stand Toilet Transfer Equipment: Comfort height toilet ADL Comments: Pt did share with OT she had some decreased strength in both hands.  OT provided pt with theraputty and instructed her on use (yellow and red)  Pt did well working with theraputty and feels it will be beneficial to her.  Focused on flexion, extension, opposition, etc using theraputty      OT  Goals(current goals can now be found in the care plan section)    Visit Information  History of Present Illness: 68 year old female with past medical history of left invasive ductal breast carcinoma, on chemotherapy (taxol, carboplatin), dyslipidemia, seen today in cancer center and had complaints of ongoing fevers for past week and a half prior tot this admission. She has had associated upper respiratory tract symptoms with nasal drainage and occasional shortness of breath. She was on outpt amoxicillin for sinusitis and blood cultures, urine culture and CXR done at that time were negative. She continue to spike fever at home with highest temp slightly above 102F. She was sent from cancer center for evaluation of ongoing fevers. At this time she has no major complaints. This was a direct admission so ED events/blood work evaluation/diagnostic studies were not done at the time of the admission.          Cognition  Cognition Arousal/Alertness: Awake/alert Behavior During Therapy: WFL for tasks assessed/performed Overall Cognitive Status: Within Functional Limits for tasks assessed    Mobility  Transfers Overall transfer level: Needs assistance Transfers: Sit to/from Stand Sit to Stand: Supervision General transfer comment: Pt did lose balance with OT but was able to recover. Husband aware.            End of Session OT - End of Session Activity Tolerance: Patient tolerated treatment well Patient left: in bed;with call bell/phone within reach;with family/visitor present Nurse Communication: Other (comment) (pt did well entire OT session with no oxygen)  GO     Wai Litt, Thereasa Parkin 09/26/2013, 11:19 AM

## 2013-09-26 NOTE — Progress Notes (Signed)
[facility] PROGRESS NOTE  Ellen Beltran NUU:[id number] DOB: December 12, 1945 DOA: 09/23/2013 PCP: Woody Seller, MD  Brief narrative: 68 year old female with past medical history of left invasive ductal breast carcinoma, on chemotherapy (taxol, carboplatin), dyslipidemia, seen today in cancer center and had complaints of ongoing fevers for past week and a half prior tot this admission. She was on outpt amoxicillin for sinusitis and blood cultures, urine culture and CXR done at that time were negative. She was dierctly admitted for cancer center for FUO. Further evaluation included CT chest and neck which did not reveal acute findings to explain left supraclavicular swelling and/or persistent fever.   Assessment and Plan:   Principal Problem:  FUO (fever of unknown origin)  - Blood cultures so far negative; influenza, legionella, strep pneumo all negative  - Tmax in past 24 hours: 98.8 F - Per infectious disease, we will stop antibiotics and observe fever curve off antibiotics  Active Problems:  Breast cancer  - management per oncology  Leukocytosis, unspecified  - related to ongoing fevers  - will continue to monitor CBC  Anemia of chronic disease  - related to history of breast ca and sequela of chemotherapy  - hemoglobin 9.1 on 1/7 but then 8.4 and 7.7 --> 7.2  - given 1 unit PRBC 09/24/2013   Code Status: Full  Family Communication: family at the bedside  Disposition Plan: home when stable   Consultants:  Oncology  Infectious disease  Procedures:  None  Antibiotics:  Vancomycin 09/23/2013 --> 09/26/2013 Zosyn 09/23/2013 --> 09/26/2013  Leisa Lenz, MD  [facility] Pager [phone]  If 7PM-7AM, please contact night-coverage [email or web address] Password [email or web address] 09/26/2013, 1:02 PM   LOS: 3 days    HPI/Subjective: No acute overnight events.  Objective: Filed Vitals:   09/26/13 0208 09/26/13 0549 09/26/13 0952 09/26/13 1113  BP: 120/62 121/66 120/49   Pulse: 75 95  74   Temp: 97.5 F (36.4 C) 97.7 F (36.5 C) 97.6 F (36.4 C)   TempSrc: Oral Oral Oral   Resp: 16 16 16    Height:      Weight:      SpO2: 95% 97% 97% 94%    Intake/Output Summary (Last 24 hours) at 09/26/13 1302 Last data filed at 09/26/13 0952  Gross per 24 hour  Intake    600 ml  Output    500 ml  Net    100 ml    Exam:   General:  Pt is alert, follows commands appropriately, not in acute distress  Cardiovascular: Regular rate and rhythm, S1/S2, no murmurs, no rubs, no gallops  Respiratory: Clear to auscultation bilaterally, no wheezing, no crackles, no rhonchi  Abdomen: Soft, non tender, non distended, bowel sounds present, no guarding  Extremities: No edema, pulses DP and PT palpable bilaterally  Neuro: Grossly nonfocal  Data Reviewed: Basic Metabolic Panel:  Recent Labs Lab 09/21/13 1101 09/23/13 0941 09/23/13 1345 09/24/13 0548  NA 139 136 137 139  K 3.4* 3.8 3.5* 3.6*  CL  --   --  95* 99  CO2 28 26 26 28   GLUCOSE 172* 172* 95 99  BUN 10.8 11.1 10 11   CREATININE 0.7 0.7 0.46* 0.55  CALCIUM 9.7 9.7 8.9 8.6  MG  --   --  1.9  --   PHOS  --   --  3.7  --    Liver Function Tests:  Recent Labs Lab 09/21/13 1101 09/23/13 0941 09/23/13 1345 09/24/13 0548  AST 73* 56* 68*  57*  ALT 137* 123* 121* 100*  ALKPHOS 87 76 73 61  BILITOT 0.26 0.28 0.3 0.2*  PROT 6.9 6.5 6.4 5.5*  ALBUMIN 2.3* 2.1* 2.2* 2.0*   No results found for this basename: LIPASE, AMYLASE,  in the last 168 hours No results found for this basename: AMMONIA,  in the last 168 hours CBC:  Recent Labs Lab 09/21/13 1101 09/23/13 0940 09/23/13 1345 09/24/13 0548  WBC 11.4* 13.6* 11.1* 9.8  NEUTROABS 6.5 8.7* 6.1  --   HGB 9.1* 8.4* 7.7* 7.2*  HCT 27.0* 25.3* 23.6* 22.9*  MCV 89.2 89.2 89.4 91.2  PLT 344 394 368 337   Cardiac Enzymes: No results found for this basename: CKTOTAL, CKMB, CKMBINDEX, TROPONINI,  in the last 168 hours BNP: No components found with this  basename: POCBNP,  CBG:  Recent Labs Lab 09/24/13 0751 09/25/13 0747 09/26/13 0851  GLUCAP 88 130* 302*    Recent Results (from the past 240 hour(s))  URINE CULTURE     Status: None   Collection Time    09/21/13 11:01 AM      Result Value Range Status   Urine Culture, Routine Culture, Urine   Final   Comment: Final - ===== COLONY COUNT: =====     NO GROWTH     NO GROWTH  CULTURE, BLOOD (ROUTINE X 2)     Status: None   Collection Time    09/23/13  1:36 PM      Result Value Range Status   Specimen Description BLOOD PORTA CATH   Final   Special Requests BOTTLES DRAWN AEROBIC AND ANAEROBIC 4ML   Final   Culture  Setup Time     Final   Value: 09/23/2013 20:25     Performed at [facility]   Culture     Final   Value:        BLOOD CULTURE RECEIVED NO GROWTH TO DATE CULTURE WILL BE HELD FOR 5 DAYS BEFORE ISSUING A FINAL NEGATIVE REPORT     Performed at [facility]   Report Status PENDING   Incomplete  CULTURE, BLOOD (ROUTINE X 2)     Status: None   Collection Time    09/23/13  2:05 PM      Result Value Range Status   Specimen Description BLOOD LEFT HAND   Final   Special Requests BOTTLES DRAWN AEROBIC ONLY 3ML   Final   Culture  Setup Time     Final   Value: 09/23/2013 20:26     Performed at [facility]   Culture     Final   Value:        BLOOD CULTURE RECEIVED NO GROWTH TO DATE CULTURE WILL BE HELD FOR 5 DAYS BEFORE ISSUING A FINAL NEGATIVE REPORT     Performed at [facility]   Report Status PENDING   Incomplete  CULTURE, EXPECTORATED SPUTUM-ASSESSMENT     Status: None   Collection Time    09/23/13  7:59 PM      Result Value Range Status   Specimen Description SPUTUM   Final   Special Requests Immunocompromised   Final   Sputum evaluation     Final   Value: THIS SPECIMEN IS ACCEPTABLE. RESPIRATORY CULTURE REPORT TO FOLLOW.   Report Status 09/23/2013 FINAL   Final  CULTURE, RESPIRATORY (NON-EXPECTORATED)     Status: None   Collection  Time    09/23/13  7:59 PM      Result Value  Range Status   Specimen Description SPUTUM   Final   Special Requests NONE   Final   Gram Stain     Final   Value: MODERATE WBC PRESENT,BOTH PMN AND MONONUCLEAR     NO SQUAMOUS EPITHELIAL CELLS SEEN     MODERATE GRAM POSITIVE COCCI IN PAIRS     RARE GRAM NEGATIVE RODS     RARE GRAM POSITIVE RODS   Culture     Final   Value: NORMAL OROPHARYNGEAL FLORA     Performed at [facility]   Report Status 09/26/2013 FINAL   Final     Studies: Ct Soft Tissue Neck W Contrast 09/25/2013    FINDINGS: Negative CT scan of the neck. No cause of supraclavicular swelling identified. Port catheter in place on the right without discernible complication on this portion of the exam.   Electronically Signed   By: Nelson Chimes M.D.   On: 09/25/2013 08:39   Ct Chest W Contrast 09/25/2013   IMPRESSION: No cause of fever or supraclavicular swelling is identified. Chronic pleural and parenchymal scarring.  Catheter tip does appear to terminate in the right atrium.   Electronically Signed   By: Nelson Chimes M.D.   On: 09/25/2013 08:44    Scheduled Meds: . feeding supplement (ENSURE)  1 Container Oral TID BM  . gabapentin  100 mg Oral TID  . simvastatin  40 mg Oral QPM   Continuous Infusions: . sodium chloride 1,000 mL (09/24/13 0240)

## 2013-09-27 LAB — CBC WITH DIFFERENTIAL/PLATELET
Basophils Absolute: 0 10*3/uL (ref 0.0–0.1)
Basophils Relative: 0 % (ref 0–1)
Eosinophils Absolute: 0.3 10*3/uL (ref 0.0–0.7)
Eosinophils Relative: 2 % (ref 0–5)
HCT: 27.2 % — ABNORMAL LOW (ref 36.0–46.0)
HEMOGLOBIN: 8.8 g/dL — AB (ref 12.0–15.0)
LYMPHS ABS: 1.9 10*3/uL (ref 0.7–4.0)
LYMPHS PCT: 15 % (ref 12–46)
MCH: 29.3 pg (ref 26.0–34.0)
MCHC: 32.4 g/dL (ref 30.0–36.0)
MCV: 90.7 fL (ref 78.0–100.0)
MONOS PCT: 9 % (ref 3–12)
Monocytes Absolute: 1.1 10*3/uL — ABNORMAL HIGH (ref 0.1–1.0)
NEUTROS PCT: 74 % (ref 43–77)
Neutro Abs: 9.3 10*3/uL — ABNORMAL HIGH (ref 1.7–7.7)
PLATELETS: 426 10*3/uL — AB (ref 150–400)
RBC: 3 MIL/uL — ABNORMAL LOW (ref 3.87–5.11)
RDW: 16.2 % — ABNORMAL HIGH (ref 11.5–15.5)
WBC: 12.6 10*3/uL — AB (ref 4.0–10.5)

## 2013-09-27 LAB — BASIC METABOLIC PANEL
BUN: 18 mg/dL (ref 6–23)
CALCIUM: 8.4 mg/dL (ref 8.4–10.5)
CO2: 27 meq/L (ref 19–32)
Chloride: 105 mEq/L (ref 96–112)
Creatinine, Ser: 0.61 mg/dL (ref 0.50–1.10)
GFR calc Af Amer: >90 mL/min (ref >90)
GFR calc non Af Amer: >90 mL/min (ref >90)
Glucose, Bld: 97 mg/dL (ref 70–99)
POTASSIUM: 3.2 meq/L — AB (ref 3.7–5.3)
SODIUM: 143 meq/L (ref 137–147)

## 2013-09-27 LAB — GLUCOSE, CAPILLARY: GLUCOSE-CAPILLARY: 80 mg/dL (ref 70–99)

## 2013-09-27 LAB — CULTURE, BLOOD (SINGLE)

## 2013-09-27 NOTE — Progress Notes (Signed)
[facility] PROGRESS NOTE  JOHNI VANGORP [id number] DOB: Jun 30, 1946 DOA: 09/23/2013 PCP: Woody Seller, MD  Brief narrative: 68 year old female with past medical history of left invasive ductal breast carcinoma, on chemotherapy (taxol, carboplatin), dyslipidemia, seen today in cancer center and had complaints of ongoing fevers for past week and a half prior tot this admission. She was on outpt amoxicillin for sinusitis and blood cultures, urine culture and CXR done at that time were negative. She was dierctly admitted for cancer center for FUO. Further evaluation included CT chest and neck which did not reveal acute findings to explain left supraclavicular swelling and/or persistent fever.  Pt has not had fever for 24 hours off antibiotics. Plan per ID to observe additional 24 hours while off antibiotics.   Assessment and Plan:   Principal Problem:  FUO (fever of unknown origin)  - Blood cultures so far negative; influenza, legionella, strep pneumo all negative  - Tmax in past 24 hours: 98.2 F  - Per infectious disease,  Stopped antibiotics 09/26/13; will give another 24 hours to make sure no fever spike. If no fever then this was likely viral. Can keep port-a-cath in. Active Problems:  Breast cancer  - management per oncology  Leukocytosis, unspecified  - related to ongoing fevers  - will continue to monitor CBC  Anemia of chronic disease  - related to history of breast ca and sequela of chemotherapy  - hemoglobin 9.1 on 1/7 but then 8.4 and 7.7 --> 7.2  - given 1 unit PRBC 09/24/2013    Code Status: Full  Family Communication: family at the bedside  Disposition Plan: home when stable; likely in next 24 hours   Consultants:  Oncology  Infectious disease  Procedures:  None  Antibiotics:  Vancomycin 09/23/2013 --> 09/26/2013  Zosyn 09/23/2013 --> 09/26/2013  Leisa Lenz, MD  [facility] Pager [phone]  If 7PM-7AM, please contact  night-coverage [email or web address] Password [facility] 09/27/2013, 4:27 PM   LOS: 4 days    HPI/Subjective: No overnight events.   Objective: Filed Vitals:   09/27/13 0500 09/27/13 0655 09/27/13 0945 09/27/13 1404  BP:  120/65 119/69 125/59  Pulse:  73 104 101  Temp:  98.2 F (36.8 C) 97.8 F (36.6 C) 98.2 F (36.8 C)  TempSrc:  Oral Oral Oral  Resp:  16 16 16   Height:      Weight: 114 lb 14.4 oz (52.118 kg)     SpO2:  94% 98% 97%    Intake/Output Summary (Last 24 hours) at 09/27/13 1627 Last data filed at 09/27/13 1404  Gross per 24 hour  Intake 1661.25 ml  Output      0 ml  Net 1661.25 ml    Exam:   General:  Pt is alert, follows commands appropriately, not in acute distress  Cardiovascular: Regular rate and rhythm, S1/S2, no murmurs, no rubs, no gallops  Respiratory: Clear to auscultation bilaterally, no wheezing, no crackles, no rhonchi  Abdomen: Soft, non tender, non distended, bowel sounds present, no guarding  Extremities: No edema, pulses DP and PT palpable bilaterally  Neuro: Grossly nonfocal  Data Reviewed: Basic Metabolic Panel:  Recent Labs Lab 09/21/13 1101 09/23/13 0941 09/23/13 1345 09/24/13 0548 09/27/13 0505  NA 139 136 137 139 143  K 3.4* 3.8 3.5* 3.6* 3.2*  CL  --   --  95* 99 105  CO2 28 26 26 28 27   GLUCOSE 172* 172* 95 99 97  BUN 10.8 11.1 10 11 18   CREATININE 0.7  0.7 0.46* 0.55 0.61  CALCIUM 9.7 9.7 8.9 8.6 8.4  MG  --   --  1.9  --   --   PHOS  --   --  3.7  --   --    Liver Function Tests:  Recent Labs Lab 09/21/13 1101 09/23/13 0941 09/23/13 1345 09/24/13 0548  AST 73* 56* 68* 57*  ALT 137* 123* 121* 100*  ALKPHOS 87 76 73 61  BILITOT 0.26 0.28 0.3 0.2*  PROT 6.9 6.5 6.4 5.5*  ALBUMIN 2.3* 2.1* 2.2* 2.0*   No results found for this basename: LIPASE, AMYLASE,  in the last 168 hours No results found for this basename: AMMONIA,  in the last 168 hours CBC:  Recent Labs Lab 09/21/13 1101 09/23/13 0940 09/23/13 1345  09/24/13 0548 09/27/13 0505  WBC 11.4* 13.6* 11.1* 9.8 12.6*  NEUTROABS 6.5 8.7* 6.1  --  9.3*  HGB 9.1* 8.4* 7.7* 7.2* 8.8*  HCT 27.0* 25.3* 23.6* 22.9* 27.2*  MCV 89.2 89.2 89.4 91.2 90.7  PLT 344 394 368 337 426*   Cardiac Enzymes: No results found for this basename: CKTOTAL, CKMB, CKMBINDEX, TROPONINI,  in the last 168 hours BNP: No components found with this basename: POCBNP,  CBG:  Recent Labs Lab 09/24/13 0751 09/25/13 0747 09/26/13 0851 09/27/13 0742  GLUCAP 88 130* 302* 80    Recent Results (from the past 240 hour(s))  URINE CULTURE     Status: None   Collection Time    09/21/13 11:01 AM      Result Value Range Status   Urine Culture, Routine Culture, Urine   Final   Comment: Final - ===== COLONY COUNT: =====     NO GROWTH     NO GROWTH  CULTURE, BLOOD (SINGLE)     Status: None   Collection Time    09/21/13 11:07 AM      Result Value Range Status   Blood Culture, Routine Culture, Blood   Final   Comment: Final - ===== FINAL REPORT =====NO GROWTH 5 DAYS  CULTURE, BLOOD (ROUTINE X 2)     Status: None   Collection Time    09/23/13  1:36 PM      Result Value Range Status   Specimen Description BLOOD PORTA CATH   Final   Special Requests BOTTLES DRAWN AEROBIC AND ANAEROBIC 4ML   Final   Culture  Setup Time     Final   Value: 09/23/2013 20:25     Performed at [facility]   Culture     Final   Value:        BLOOD CULTURE RECEIVED NO GROWTH TO DATE CULTURE WILL BE HELD FOR 5 DAYS BEFORE ISSUING A FINAL NEGATIVE REPORT     Performed at [facility]   Report Status PENDING   Incomplete  CULTURE, BLOOD (ROUTINE X 2)     Status: None   Collection Time    09/23/13  2:05 PM      Result Value Range Status   Specimen Description BLOOD LEFT HAND   Final   Special Requests BOTTLES DRAWN AEROBIC ONLY 3ML   Final   Culture  Setup Time     Final   Value: 09/23/2013 20:26     Performed at [facility]   Culture     Final   Value:        BLOOD  CULTURE RECEIVED NO GROWTH TO DATE CULTURE WILL BE HELD FOR 5 DAYS BEFORE ISSUING  A FINAL NEGATIVE REPORT     Performed at [facility]   Report Status PENDING   Incomplete  CULTURE, EXPECTORATED SPUTUM-ASSESSMENT     Status: None   Collection Time    09/23/13  7:59 PM      Result Value Range Status   Specimen Description SPUTUM   Final   Special Requests Immunocompromised   Final   Sputum evaluation     Final   Value: THIS SPECIMEN IS ACCEPTABLE. RESPIRATORY CULTURE REPORT TO FOLLOW.   Report Status 09/23/2013 FINAL   Final  CULTURE, RESPIRATORY (NON-EXPECTORATED)     Status: None   Collection Time    09/23/13  7:59 PM      Result Value Range Status   Specimen Description SPUTUM   Final   Special Requests NONE   Final   Gram Stain     Final   Value: MODERATE WBC PRESENT,BOTH PMN AND MONONUCLEAR     NO SQUAMOUS EPITHELIAL CELLS SEEN     MODERATE GRAM POSITIVE COCCI IN PAIRS     RARE GRAM NEGATIVE RODS     RARE GRAM POSITIVE RODS   Culture     Final   Value: NORMAL OROPHARYNGEAL FLORA     Performed at [facility]   Report Status 09/26/2013 FINAL   Final     Studies: No results found.  Scheduled Meds: . feeding supplement (ENSURE)  1 Container Oral TID BM  . gabapentin  100 mg Oral TID  . simvastatin  40 mg Oral QPM   Continuous Infusions: . sodium chloride 75 mL/hr at 09/27/13 0504

## 2013-09-27 NOTE — Progress Notes (Signed)
[facility]  Date of Admission:  09/23/2013  Antibiotics: Antibiotics Given (last 72 hours)   Date/Time Action Medication Dose Rate   09/24/13 1452 Given   piperacillin-tazobactam (ZOSYN) IVPB 3.375 g 3.375 g 12.5 mL/hr   09/24/13 1452 Given   vancomycin (VANCOCIN) 500 mg in sodium chloride 0.9 % 100 mL IVPB 500 mg 100 mL/hr   09/24/13 2129 Given   piperacillin-tazobactam (ZOSYN) IVPB 3.375 g 3.375 g 12.5 mL/hr   09/25/13 0304 Given   vancomycin (VANCOCIN) 500 mg in sodium chloride 0.9 % 100 mL IVPB 500 mg 100 mL/hr   09/25/13 0557 Given   piperacillin-tazobactam (ZOSYN) IVPB 3.375 g 3.375 g 12.5 mL/hr   09/25/13 1343 Given   piperacillin-tazobactam (ZOSYN) IVPB 3.375 g 3.375 g 12.5 mL/hr   09/25/13 1510 Given   vancomycin (VANCOCIN) 500 mg in sodium chloride 0.9 % 100 mL IVPB 500 mg 100 mL/hr   09/25/13 2131 Given   piperacillin-tazobactam (ZOSYN) IVPB 3.375 g 3.375 g 12.5 mL/hr   09/26/13 0043 Given   vancomycin (VANCOCIN) 500 mg in sodium chloride 0.9 % 100 mL IVPB 500 mg 100 mL/hr   09/26/13 0522 Given   piperacillin-tazobactam (ZOSYN) IVPB 3.375 g 3.375 g 12.5 mL/hr   09/26/13 0818 Given   vancomycin (VANCOCIN) 500 mg in sodium chloride 0.9 % 100 mL IVPB 500 mg 100 mL/hr      Subjective: Feels ok, facial swelling gone, still no fever  Objective: Temp:  [97.7 F (36.5 C)-98.2 F (36.8 C)] 97.8 F (36.6 C) (01/13 0945) Pulse Rate:  [73-104] 104 (01/13 0945) Resp:  [16] 16 (01/13 0945) BP: (106-126)/(56-69) 119/69 mmHg (01/13 0945) SpO2:  [93 %-98 %] 98 % (01/13 0945) Weight:  [114 lb 14.4 oz (52.118 kg)] 114 lb 14.4 oz (52.118 kg) (01/13 0500)  General: awake, in chair Skin: no rashes, facial erythema resolved Lungs: CTA B Cor: RRR Abdomen: soft, nt, nd Ext: no edema  Lab Results Lab Results  Component Value Date   WBC 12.6* 09/27/2013   HGB 8.8* 09/27/2013   HCT 27.2* 09/27/2013   MCV 90.7 09/27/2013   PLT 426* 09/27/2013     Lab Results  Component Value Date   CREATININE 0.61 09/27/2013   BUN 18 09/27/2013   NA 143 09/27/2013   K 3.2* 09/27/2013   CL 105 09/27/2013   CO2 27 09/27/2013    Lab Results  Component Value Date   ALT 100* 09/24/2013   AST 57* 09/24/2013   ALKPHOS 61 09/24/2013   BILITOT 0.2* 09/24/2013      Microbiology: Recent Results (from the past 240 hour(s))  URINE CULTURE     Status: None   Collection Time    09/21/13 11:01 AM      Result Value Range Status   Urine Culture, Routine Culture, Urine   Final   Comment: Final - ===== COLONY COUNT: =====     NO GROWTH     NO GROWTH  CULTURE, BLOOD (SINGLE)     Status: None   Collection Time    09/21/13 11:07 AM      Result Value Range Status   Blood Culture, Routine Culture, Blood   Final   Comment: Final - ===== FINAL REPORT =====NO GROWTH 5 DAYS  CULTURE, BLOOD (ROUTINE X 2)     Status: None   Collection Time    09/23/13  1:36 PM      Result Value Range Status   Specimen Description BLOOD PORTA  CATH   Final   Special Requests BOTTLES DRAWN AEROBIC AND ANAEROBIC 4ML   Final   Culture  Setup Time     Final   Value: 09/23/2013 20:25     Performed at [facility]   Culture     Final   Value:        BLOOD CULTURE RECEIVED NO GROWTH TO DATE CULTURE WILL BE HELD FOR 5 DAYS BEFORE ISSUING A FINAL NEGATIVE REPORT     Performed at [facility]   Report Status PENDING   Incomplete  CULTURE, BLOOD (ROUTINE X 2)     Status: None   Collection Time    09/23/13  2:05 PM      Result Value Range Status   Specimen Description BLOOD LEFT HAND   Final   Special Requests BOTTLES DRAWN AEROBIC ONLY 3ML   Final   Culture  Setup Time     Final   Value: 09/23/2013 20:26     Performed at [facility]   Culture     Final   Value:        BLOOD CULTURE RECEIVED NO GROWTH TO DATE CULTURE WILL BE HELD FOR 5 DAYS BEFORE ISSUING A FINAL NEGATIVE REPORT     Performed at [facility]   Report Status PENDING   Incomplete   CULTURE, EXPECTORATED SPUTUM-ASSESSMENT     Status: None   Collection Time    09/23/13  7:59 PM      Result Value Range Status   Specimen Description SPUTUM   Final   Special Requests Immunocompromised   Final   Sputum evaluation     Final   Value: THIS SPECIMEN IS ACCEPTABLE. RESPIRATORY CULTURE REPORT TO FOLLOW.   Report Status 09/23/2013 FINAL   Final  CULTURE, RESPIRATORY (NON-EXPECTORATED)     Status: None   Collection Time    09/23/13  7:59 PM      Result Value Range Status   Specimen Description SPUTUM   Final   Special Requests NONE   Final   Gram Stain     Final   Value: MODERATE WBC PRESENT,BOTH PMN AND MONONUCLEAR     NO SQUAMOUS EPITHELIAL CELLS SEEN     MODERATE GRAM POSITIVE COCCI IN PAIRS     RARE GRAM NEGATIVE RODS     RARE GRAM POSITIVE RODS   Culture     Final   Value: NORMAL OROPHARYNGEAL FLORA     Performed at [facility]   Report Status 09/26/2013 FINAL   Final    Studies/Results: No results found.  Assessment/Plan: 1)  FUO -  No new symptoms, no positive cultures.  Not neutropenic.  Last fever 1/9, 1/10 with temp to 100.1.  No chills.  -at this point, with no positive cultures, viral illness possible, continue off of antibiotics -restart if she spikes another fever over 101 or new concerns.  -I think it is reasonable to keep her one more day to be sure she remains stable, and ok from ID standpoint to discharge tomorrow unless concerns arise.    Scharlene Gloss, [place] [facility] [place] [email or web address] [phone] pager   [phone] cell 09/27/2013, 2:34 PM

## 2013-09-27 NOTE — Progress Notes (Signed)
Occupational Therapy Treatment Patient Details Name: Ellen Beltran MRN: [id number] DOB: 25-Jan-1946 Today's Date: 09/27/2013 Time: 9678-9381 OT Time Calculation (min): 22 min  OT Assessment / Plan / Recommendation  History of present illness 68 year old female with past medical history of left invasive ductal breast carcinoma, on chemotherapy (taxol, carboplatin), dyslipidemia, seen today in cancer center and had complaints of ongoing fevers for past week and a half prior tot this admission. She has had associated upper respiratory tract symptoms with nasal drainage and occasional shortness of breath. She was on outpt amoxicillin for sinusitis and blood cultures, urine culture and CXR done at that time were negative. She continue to spike fever at home with highest temp slightly above 102F. She was sent from cancer center for evaluation of ongoing fevers. At this time she has no major complaints. This was a direct admission so ED events/blood work evaluation/diagnostic studies were not done at the time of the admission.   OT comments    Follow Up Recommendations  Home health OT    Barriers to Discharge       Equipment Recommendations  None recommended by OT    Recommendations for Other Services    Frequency Min 2X/week   Progress towards OT Goals Progress towards OT goals: Progressing toward goals  Plan      Precautions / Restrictions Precautions Precautions: Fall Restrictions Weight Bearing Restrictions: No   Pertinent Vitals/Pain No pain reported    ADL  Transfers/Ambulation Related to ADLs: min guard ambulating to bathroom.  Pt had performed adl prior to OT's arrival.  Worked on retrieving items from low surface and educated on use of reacher.   ADL Comments: Pt was performing theraputty exercises when I arrived.  She has arthritis in hands and neuropathy.  Provided additional activities for coordination including turning cards over and inhand manipulation with coin.  Husband  works 4 days.  Encouraged pt to consider walker for safety and have family when she first goes home if unsteady.  Educated on energy conservation.  Pt is of short stature and doesn't want to consider 3:1 for outside of bathroom, if alone.  She has a standard walker which she inherited    OT Diagnosis:    OT Problem List:   OT Treatment Interventions:     OT Goals(current goals can now be found in the care plan section)    Visit Information  Last OT Received On: 09/27/13 History of Present Illness: 68 year old female with past medical history of left invasive ductal breast carcinoma, on chemotherapy (taxol, carboplatin), dyslipidemia, seen today in cancer center and had complaints of ongoing fevers for past week and a half prior tot this admission. She has had associated upper respiratory tract symptoms with nasal drainage and occasional shortness of breath. She was on outpt amoxicillin for sinusitis and blood cultures, urine culture and CXR done at that time were negative. She continue to spike fever at home with highest temp slightly above 102F. She was sent from cancer center for evaluation of ongoing fevers. At this time she has no major complaints. This was a direct admission so ED events/blood work evaluation/diagnostic studies were not done at the time of the admission.    Subjective Data      Prior Functioning       Cognition  Cognition Arousal/Alertness: Awake/alert Behavior During Therapy: WFL for tasks assessed/performed Overall Cognitive Status: Within Functional Limits for tasks assessed    Mobility  Exercises      Balance    End of Session OT - End of Session Activity Tolerance: Patient tolerated treatment well Patient left:  (on bench at window with husband)  GO     Jodi Kappes 09/27/2013, 10:55 AM Lesle Chris, OTR/L [phone] 09/27/2013

## 2013-09-28 ENCOUNTER — Other Ambulatory Visit (INDEPENDENT_AMBULATORY_CARE_PROVIDER_SITE_OTHER): Payer: Self-pay | Admitting: Surgery

## 2013-09-28 DIAGNOSIS — C50912 Malignant neoplasm of unspecified site of left female breast: Secondary | ICD-10-CM

## 2013-09-28 LAB — GLUCOSE, CAPILLARY: GLUCOSE-CAPILLARY: 78 mg/dL (ref 70–99)

## 2013-09-28 MED ORDER — HEPARIN SOD (PORK) LOCK FLUSH 100 UNIT/ML IV SOLN
500.0000 [IU] | INTRAVENOUS | Status: AC | PRN
  Administered 2013-09-28: 500 [IU]
  Filled 2013-09-28: qty 5

## 2013-09-28 MED ORDER — ENSURE PUDDING PO PUDG
1.0000 | Freq: Three times a day (TID) | ORAL | Status: DC
  Filled 2013-09-28 (×2): qty 1

## 2013-09-28 MED ORDER — ACETAMINOPHEN 325 MG PO TABS: 650.0000 mg | ORAL_TABLET | Freq: Four times a day (QID) | ORAL | Status: DC | PRN

## 2013-09-28 NOTE — Progress Notes (Signed)
Patient awaiting surgical consult with Dr. Alphonsa Overall prior to discharge.  Contacted Dr. Pollie Friar office and he will round on patient between 1pm-3pm.  Patient and her husband updated with this information.  Ellen Beltran [facility]  09/28/2013  12:48 PM

## 2013-09-28 NOTE — Progress Notes (Signed)
Discharge instructions reviewed with patient and her husband using teach back method and they demonstrated understanding.  Patient understands follow up appointments and when she would need to notify the MD.  Patient appears stable for discharge home.  Assessment unchanged from this am.  Patient has already seen Dr. Alphonsa Overall for surgery consult.  Ellen Beltran [facility]  09/28/2013 2:55 PM

## 2013-09-28 NOTE — Discharge Instructions (Signed)
Fever, Adult A fever is a higher than normal body temperature. In an adult, an oral temperature around 98.6 F (37 C) is considered normal. A temperature of 100.4 F (38 C) or higher is generally considered a fever. Mild or moderate fevers generally have no long-term effects and often do not require treatment. Extreme fever (greater than or equal to 106 F or 41.1 C) can cause seizures. The sweating that may occur with repeated or prolonged fever may cause dehydration. Elderly people can develop confusion during a fever. A measured temperature can vary with:  Age.  Time of day.  Method of measurement (mouth, underarm, rectal, or ear). The fever is confirmed by taking a temperature with a thermometer. Temperatures can be taken different ways. Some methods are accurate and some are not.  An oral temperature is used most commonly. Electronic thermometers are fast and accurate.  An ear temperature will only be accurate if the thermometer is positioned as recommended by the manufacturer.  A rectal temperature is accurate and done for those adults who have a condition where an oral temperature cannot be taken.  An underarm (axillary) temperature is not accurate and not recommended. Fever is a symptom, not a disease.  CAUSES   Infections commonly cause fever.  Some noninfectious causes for fever include:  Some arthritis conditions.  Some thyroid or adrenal gland conditions.  Some immune system conditions.  Some types of cancer.  A medicine reaction.  High doses of certain street drugs such as methamphetamine.  Dehydration.  Exposure to high outside or room temperatures.  Occasionally, the source of a fever cannot be determined. This is sometimes called a "fever of unknown origin" (FUO).  Some situations may lead to a temporary rise in body temperature that may go away on its own. Examples are:  Childbirth.  Surgery.  Intense exercise. HOME CARE INSTRUCTIONS   Take  appropriate medicines for fever. Follow dosing instructions carefully. If you use acetaminophen to reduce the fever, be careful to avoid taking other medicines that also contain acetaminophen. Do not take aspirin for a fever if you are younger than age 19. There is an association with Reye's syndrome. Reye's syndrome is a rare but potentially deadly disease.  If an infection is present and antibiotics have been prescribed, take them as directed. Finish them even if you start to feel better.  Rest as needed.  Maintain an adequate fluid intake. To prevent dehydration during an illness with prolonged or recurrent fever, you may need to drink extra fluid.Drink enough fluids to keep your urine clear or pale yellow.  Sponging or bathing with room temperature water may help reduce body temperature. Do not use ice water or alcohol sponge baths.  Dress comfortably, but do not over-bundle. SEEK MEDICAL CARE IF:   You are unable to keep fluids down.  You develop vomiting or diarrhea.  You are not feeling at least partly better after 3 days.  You develop new symptoms or problems. SEEK IMMEDIATE MEDICAL CARE IF:   You have shortness of breath or trouble breathing.  You develop excessive weakness.  You are dizzy or you faint.  You are extremely thirsty or you are making little or no urine.  You develop new pain that was not there before (such as in the head, neck, chest, back, or abdomen).  You have persistant vomiting and diarrhea for more than 1 to 2 days.  You develop a stiff neck or your eyes become sensitive to light.  You develop a   skin rash.  You have a fever or persistent symptoms for more than 2 to 3 days.  You have a fever and your symptoms suddenly get worse. MAKE SURE YOU:   Understand these instructions.  Will watch your condition.  Will get help right away if you are not doing well or get worse. Document Released: 02/25/2001 Document Revised: 11/24/2011 Document  Reviewed: 07/03/2011 Exit[employer] Patient Information 2014 [employer].  

## 2013-09-28 NOTE — Care Management Note (Signed)
Cm spoke with patient and spouse at bedside to discuss discharge planning. Cm informed pt of OT recommendations for HHOT. Pt declines at this time. Patient provided with list of Home Health agencies for [facility] and [place] if pt chooses this resource at later date.    Venita Lick Kamara Allan,MSN,RN [phone]

## 2013-09-28 NOTE — Discharge Summary (Signed)
Physician Discharge Summary  Ellen Beltran [id number] DOB: Oct 20, 1945 DOA: 09/23/2013  PCP: Woody Seller, MD  Admit date: 09/23/2013 Discharge date: 09/28/2013  Recommendations for Outpatient Follow-up:  1. Recommend followup with PCP in one week to ensure resolution of fever. 2. Followup final blood culture results.  Discharge Diagnoses:  Principal Problem:    FUO (fever of unknown origin) Active Problems:    Cancer of central portion of female breast, Left.  T2, N0.    Leukocytosis, unspecified    Anemia of chronic disease   Discharge Condition: Improved.  Diet recommendation: Low-sodium, heart healthy.  History of present illness:  Ellen Beltran is a 68 year old female with a PMH of left invasive ductal carcinoma, receiving chemotherapy with Taxol and carboplatinum, who was admitted from the [facility] on 09/23/13 with fevers as high as 10 89F.  Hospital Course by problem:  Principal Problem:  FUO (fever of unknown origin)  - Blood cultures so far negative; influenza, legionella, strep pneumo all negative.  Sputum culture grew normal oropharyngeal flora. - Tmax in past 24 hours: 99.9 F  - Chest x-ray negative, CT of the neck and chest negative for infectious etiology. - Per infectious disease, empiric antibiotics discontinued on 09/26/13. Fevers thought to be from a viral illness. Can keep port-a-cath in.  Active Problems:  Breast cancer  - Management per oncology.  No plans for further chemotherapy. For surgical consultation for lumpectomy. Once this is done, she can be discharged home. Leukocytosis, unspecified  - Thought to be related to ongoing fevers  Anemia of chronic disease  - related to history of breast ca and sequela of chemotherapy  - hemoglobin 9.1 on 1/7 but then 8.4 and 7.7 --> 7.2, stable at 8.8 on discharge - given 1 unit PRBC 09/24/2013    Procedures:  None  Consultations:  Dr. Marcy Panning, Oncology  Dr. Bobby Rumpf, Infectious  disease  Discharge Exam: Filed Vitals:   09/28/13 0530  BP: 138/58  Pulse: 93  Temp: 98.6 F (37 C)  Resp: 16   Filed Vitals:   09/27/13 2020 09/27/13 2205 09/28/13 0225 09/28/13 0530  BP:  123/59 137/68 138/58  Pulse:  98 92 93  Temp: 98.5 F (36.9 C) 98.4 F (36.9 C) 99.9 F (37.7 C) 98.6 F (37 C)  TempSrc: Oral Oral Oral Oral  Resp:  16 16 16   Height:      Weight:    50.122 kg (110 lb 8 oz)  SpO2:  96% 94% 96%    Gen:  NAD Cardiovascular:  RRR, No M/R/G Respiratory: Lungs CTAB Gastrointestinal: Abdomen soft, NT/ND with normal active bowel sounds. Extremities: No C/E/C   Discharge Instructions  Discharge Orders   Future Appointments Provider Department Dept Phone   09/30/2013 2:15 PM Shann Medal, MD [facility], [place] [phone]   Future Orders Complete By Expires   Call MD for:  extreme fatigue  As directed    Call MD for:  temperature >100.4  As directed    Diet - low sodium heart healthy  As directed    Discharge instructions  As directed    Comments:     You were cared for by Dr. Jacquelynn Cree  (a hospitalist) during your hospital stay. If you have any questions about your discharge medications or the care you received while you were in the hospital after you are discharged, you can call the unit and ask to speak with the hospitalist on call if the hospitalist that took care  of you is not available. Once you are discharged, your primary care physician will handle any further medical issues. Please note that NO REFILLS for any discharge medications will be authorized once you are discharged, as it is imperative that you return to your primary care physician (or establish a relationship with a primary care physician if you do not have one) for your aftercare needs so that they can reassess your need for medications and monitor your lab values.  Any outstanding tests can be reviewed by your PCP at your follow up visit.  It is also important to review  any medicine changes with your PCP.  Please bring these d/c instructions with you to your next visit so your physician can review these changes with you.  If you do not have a primary care physician, you can call [phone] for a physician referral.  It is highly recommended that you obtain a PCP for hospital follow up.   Increase activity slowly  As directed        Medication List    STOP taking these medications       dexamethasone 4 MG tablet  Commonly known as:  DECADRON     potassium chloride SA 20 MEQ tablet  Commonly known as:  K-DUR,KLOR-CON     PRESCRIPTION MEDICATION      TAKE these medications       acetaminophen 325 MG tablet  Commonly known as:  TYLENOL  Take 2 tablets (650 mg total) by mouth every 6 (six) hours as needed for mild pain (or Fever >/= 101).     ALIVE WOMENS 50+ PO  Take 2 each by mouth daily. Take 2 gummies per day     Benfotiamine 150 MG Caps  Take 150 mg by mouth daily.     Biotin 5000 MCG Caps  Take by mouth.     gabapentin 100 MG capsule  Commonly known as:  NEURONTIN  Take 1 capsule (100 mg total) by mouth 3 (three) times daily.     lidocaine-prilocaine cream  Commonly known as:  EMLA  Apply topically as needed.     prochlorperazine 10 MG tablet  Commonly known as:  COMPAZINE  Take 10 mg by mouth every 8 (eight) hours as needed.     simvastatin 40 MG tablet  Commonly known as:  ZOCOR  Take 40 mg by mouth every evening.          The results of significant diagnostics from this hospitalization (including imaging, microbiology, ancillary and laboratory) are listed below for reference.    Significant Diagnostic Studies: Dg Chest 2 View  09/23/2013   CLINICAL DATA:  Fever, breast cancer on chemotherapy  EXAM: CHEST  2 VIEW  COMPARISON:  09/21/2013  FINDINGS: Cardiomediastinal silhouette is stable. Mild thoracic dextroscoliosis again noted. Stable right subclavian Port-A-Cath position. No acute infiltrate or pulmonary edema. Mild  degenerative changes mid thoracic spine.  IMPRESSION: No active cardiopulmonary disease.   Electronically Signed   By: Lahoma Crocker M.D.   On: 09/23/2013 14:31   Dg Chest 2 View  09/21/2013   CLINICAL DATA:  Fever for 9 days  EXAM: CHEST  2 VIEW  COMPARISON:  CT ANGIO CHEST W/CM &/OR WO/CM dated 09/09/2013; DG CHEST 2 VIEW dated 05/19/2013; DG CHEST 1V PORT dated 05/02/2013  FINDINGS: There is a right-sided Port-A-Cath in satisfactory position. There is no focal parenchymal opacity, pleural effusion, or pneumothorax. The heart and mediastinal contours are unremarkable.  The osseous structures are unremarkable.  IMPRESSION: No active cardiopulmonary disease.   Electronically Signed   By: Kathreen Devoid   On: 09/21/2013 12:11   Ct Soft Tissue Neck W Contrast  09/25/2013   CLINICAL DATA:  Supraclavicular fossa swelling.  Fewer.  EXAM: CT NECK WITH CONTRAST  TECHNIQUE: Multidetector CT imaging of the neck was performed using the standard protocol following the bolus administration of intravenous contrast.  CONTRAST:  148mL OMNIPAQUE IOHEXOL 300 MG/ML  SOLN  COMPARISON:  Visualized intracranial contents are unremarkable. Both parotid glands are normal. Submandibular glands are normal. The thyroid gland is normal.  Arterial and venous structures in the neck appear unremarkable. No sign of jugular vein thrombosis. No carotid bifurcation atherosclerosis of significance.  There are no enlarged lymph nodes on either side of the neck.  No mucosal or submucosal lesion is seen.  No significant bony finding.  No supraclavicular lesion seen on either side. Patient has an indwelling port on the right without apparent complication discernible on this exam.  FINDINGS: Negative CT scan of the neck. No cause of supraclavicular swelling identified. Port catheter in place on the right without discernible complication on this portion of the exam.   Electronically Signed   By: Nelson Chimes M.D.   On: 09/25/2013 08:39   Ct Chest W  Contrast  09/25/2013   CLINICAL DATA:  Fever.  Supraclavicular swelling.  EXAM: CT CHEST WITH CONTRAST  TECHNIQUE: Multidetector CT imaging of the chest was performed during intravenous contrast administration.  CONTRAST:  177mL OMNIPAQUE IOHEXOL 300 MG/ML  SOLN  COMPARISON:  CT neck same day.  CT chest 09/09/2013  FINDINGS: There is no pleural or pericardial fluid. There are no enlarged lymph nodes in the supraclavicular regions. No axillary lymphadenopathy. Mediastinal lymph nodes are within normal limits. Vascular structures of the mediastinum are unremarkable. Subclavian catheter on the right appears grossly well positioned. The tip does appear to enter the right atrium. There is benign appearing pleural and parenchymal scarring, predominantly in the upper lobes. No focal pulmonary mass. There is curvature of the spine with ordinary degenerative disc disease. Scans in the upper abdomen do not show any significant lesion.  IMPRESSION: No cause of fever or supraclavicular swelling is identified. Chronic pleural and parenchymal scarring.  Catheter tip does appear to terminate in the right atrium.   Electronically Signed   By: Nelson Chimes M.D.   On: 09/25/2013 08:44    Labs:  Basic Metabolic Panel:  Recent Labs Lab 09/21/13 1101 09/23/13 0941  09/23/13 1345 09/24/13 0548 09/27/13 0505  NA 139 136  --  137 139 143  K 3.4* 3.8  < > 3.5* 3.6* 3.2*  CL  --   --   --  95* 99 105  CO2 28 26  --  26 28 27   GLUCOSE 172* 172*  --  95 99 97  BUN 10.8 11.1  --  10 11 18   CREATININE 0.7 0.7  --  0.46* 0.55 0.61  CALCIUM 9.7 9.7  --  8.9 8.6 8.4  MG  --   --   --  1.9  --   --   PHOS  --   --   --  3.7  --   --   < > = values in this interval not displayed. GFR Estimated Creatinine Clearance: 48.7 ml/min (by C-G formula based on Cr of 0.61). Liver Function Tests:  Recent Labs Lab 09/21/13 1101 09/23/13 0941 09/23/13 1345 09/24/13 0548  AST 73* 56* 68* 57*  ALT 137* 123* 121* 100*  ALKPHOS  87 76 73 61  BILITOT 0.26 0.28 0.3 0.2*  PROT 6.9 6.5 6.4 5.5*  ALBUMIN 2.3* 2.1* 2.2* 2.0*   Coagulation profile  Recent Labs Lab 09/23/13 1345  INR 1.05    CBC:  Recent Labs Lab 09/21/13 1101 09/23/13 0940 09/23/13 1345 09/24/13 0548 09/27/13 0505  WBC 11.4* 13.6* 11.1* 9.8 12.6*  NEUTROABS 6.5 8.7* 6.1  --  9.3*  HGB 9.1* 8.4* 7.7* 7.2* 8.8*  HCT 27.0* 25.3* 23.6* 22.9* 27.2*  MCV 89.2 89.2 89.4 91.2 90.7  PLT 344 394 368 337 426*   CBG:  Recent Labs Lab 09/24/13 0751 09/25/13 0747 09/26/13 0851 09/27/13 0742 09/28/13 0736  GLUCAP 88 130* 302* 80 78   Microbiology Recent Results (from the past 240 hour(s))  URINE CULTURE     Status: None   Collection Time    09/21/13 11:01 AM      Result Value Range Status   Urine Culture, Routine Culture, Urine   Final   Comment: Final - ===== COLONY COUNT: =====     NO GROWTH     NO GROWTH  CULTURE, BLOOD (SINGLE)     Status: None   Collection Time    09/21/13 11:07 AM      Result Value Range Status   Blood Culture, Routine Culture, Blood   Final   Comment: Final - ===== FINAL REPORT =====NO GROWTH 5 DAYS  CULTURE, BLOOD (ROUTINE X 2)     Status: None   Collection Time    09/23/13  1:36 PM      Result Value Range Status   Specimen Description BLOOD PORTA CATH   Final   Special Requests BOTTLES DRAWN AEROBIC AND ANAEROBIC 4ML   Final   Culture  Setup Time     Final   Value: 09/23/2013 20:25     Performed at [facility]   Culture     Final   Value:        BLOOD CULTURE RECEIVED NO GROWTH TO DATE CULTURE WILL BE HELD FOR 5 DAYS BEFORE ISSUING A FINAL NEGATIVE REPORT     Performed at [facility]   Report Status PENDING   Incomplete  CULTURE, BLOOD (ROUTINE X 2)     Status: None   Collection Time    09/23/13  2:05 PM      Result Value Range Status   Specimen Description BLOOD LEFT HAND   Final   Special Requests BOTTLES DRAWN AEROBIC ONLY 3ML   Final   Culture  Setup Time     Final   Value:  09/23/2013 20:26     Performed at [facility]   Culture     Final   Value:        BLOOD CULTURE RECEIVED NO GROWTH TO DATE CULTURE WILL BE HELD FOR 5 DAYS BEFORE ISSUING A FINAL NEGATIVE REPORT     Performed at [facility]   Report Status PENDING   Incomplete  CULTURE, EXPECTORATED SPUTUM-ASSESSMENT     Status: None   Collection Time    09/23/13  7:59 PM      Result Value Range Status   Specimen Description SPUTUM   Final   Special Requests Immunocompromised   Final   Sputum evaluation     Final   Value: THIS SPECIMEN IS ACCEPTABLE. RESPIRATORY CULTURE REPORT TO FOLLOW.   Report Status 09/23/2013 FINAL   Final  CULTURE, RESPIRATORY (NON-EXPECTORATED)  Status: None   Collection Time    09/23/13  7:59 PM      Result Value Range Status   Specimen Description SPUTUM   Final   Special Requests NONE   Final   Gram Stain     Final   Value: MODERATE WBC PRESENT,BOTH PMN AND MONONUCLEAR     NO SQUAMOUS EPITHELIAL CELLS SEEN     MODERATE GRAM POSITIVE COCCI IN PAIRS     RARE GRAM NEGATIVE RODS     RARE GRAM POSITIVE RODS   Culture     Final   Value: NORMAL OROPHARYNGEAL FLORA     Performed at [facility]   Report Status 09/26/2013 FINAL   Final    Time coordinating discharge: 35 minutes.  Signed:  RAMA,CHRISTINA  Pager [phone] [facility] 09/28/2013, 9:53 AM

## 2013-09-28 NOTE — Progress Notes (Signed)
Ellen Beltran   DOB:1945/10/26   KY#:[id number]   [phone]  Subjective: Patient clinically feels much better she's been afebrile. I have discussed the situation with the patient and her husband on several occasions during this admission. I have also spoken to Dr. Alphonsa Overall who is planning on seeing the patient sometime today. Per patient there is the chance.she may be able to go home today or tomorrow. I am in agreement with this. We have no plans for further chemotherapy. I do think that she would benefit from surgery eventually. The surgical planning certainly will be done by Dr. Lucia Gaskins.   Objective:  Filed Vitals:   09/28/13 0530  BP: 138/58  Pulse: 93  Temp: 98.6 F (37 C)  Resp: 16    Body mass index is 23.1 kg/(m^2).  Intake/Output Summary (Last 24 hours) at 09/28/13 0823 Last data filed at 09/28/13 0600  Gross per 24 hour  Intake   2830 ml  Output      0 ml  Net   2830 ml     Sclerae unicteric  Oropharynx clear  No peripheral adenopathy  Lungs clear -- no rales or rhonchi  Heart regular rate and rhythm  Abdomen benign  MSK no focal spinal tenderness, no peripheral edema  Neuro nonfocal  Breast exam: Not examined  CBG (last 3)   Recent Labs  09/26/13 0851 09/27/13 0742 09/28/13 0736  GLUCAP 302* 80 78     Labs:  Lab Results  Component Value Date   WBC 12.6* 09/27/2013   HGB 8.8* 09/27/2013   HCT 27.2* 09/27/2013   MCV 90.7 09/27/2013   PLT 426* 09/27/2013   NEUTROABS 9.3* 09/27/2013    Urine Studies No results found for this basename: UACOL, UAPR, USPG, UPH, UTP, UGL, UKET, UBIL, UHGB, UNIT, UROB, ULEU, UEPI, UWBC, URBC, UBAC, CAST, CRYS, UCOM, BILUA,  in the last 72 hours  Basic Metabolic Panel:  Recent Labs Lab 09/21/13 1101 09/23/13 0941  09/23/13 1345 09/24/13 0548 09/27/13 0505  NA 139 136  --  137 139 143  K 3.4* 3.8  < > 3.5* 3.6* 3.2*  CL  --   --   --  95* 99 105  CO2 28 26  --  26 28 27   GLUCOSE 172* 172*  --  95 99 97  BUN  10.8 11.1  --  10 11 18   CREATININE 0.7 0.7  --  0.46* 0.55 0.61  CALCIUM 9.7 9.7  --  8.9 8.6 8.4  MG  --   --   --  1.9  --   --   PHOS  --   --   --  3.7  --   --   < > = values in this interval not displayed. GFR Estimated Creatinine Clearance: 48.7 ml/min (by C-G formula based on Cr of 0.61). Liver Function Tests:  Recent Labs Lab 09/21/13 1101 09/23/13 0941 09/23/13 1345 09/24/13 0548  AST 73* 56* 68* 57*  ALT 137* 123* 121* 100*  ALKPHOS 87 76 73 61  BILITOT 0.26 0.28 0.3 0.2*  PROT 6.9 6.5 6.4 5.5*  ALBUMIN 2.3* 2.1* 2.2* 2.0*   No results found for this basename: LIPASE, AMYLASE,  in the last 168 hours No results found for this basename: AMMONIA,  in the last 168 hours Coagulation profile  Recent Labs Lab 09/23/13 1345  INR 1.05    CBC:  Recent Labs Lab 09/21/13 1101 09/23/13 0940 09/23/13 1345 09/24/13 0548 09/27/13 0505  WBC  11.4* 13.6* 11.1* 9.8 12.6*  NEUTROABS 6.5 8.7* 6.1  --  9.3*  HGB 9.1* 8.4* 7.7* 7.2* 8.8*  HCT 27.0* 25.3* 23.6* 22.9* 27.2*  MCV 89.2 89.2 89.4 91.2 90.7  PLT 344 394 368 337 426*   Cardiac Enzymes: No results found for this basename: CKTOTAL, CKMB, CKMBINDEX, TROPONINI,  in the last 168 hours BNP: No components found with this basename: POCBNP,  CBG:  Recent Labs Lab 09/24/13 0751 09/25/13 0747 09/26/13 0851 09/27/13 0742 09/28/13 0736  GLUCAP 88 130* 302* 80 78   D-Dimer No results found for this basename: DDIMER,  in the last 72 hours Hgb A1c No results found for this basename: HGBA1C,  in the last 72 hours Lipid Profile No results found for this basename: CHOL, HDL, LDLCALC, TRIG, CHOLHDL, LDLDIRECT,  in the last 72 hours Thyroid function studies No results found for this basename: TSH, T4TOTAL, FREET3, T3FREE, THYROIDAB,  in the last 72 hours Anemia work up No results found for this basename: VITAMINB12, FOLATE, FERRITIN, TIBC, IRON, RETICCTPCT,  in the last 72 hours Microbiology Recent Results (from  the past 240 hour(s))  URINE CULTURE     Status: None   Collection Time    09/21/13 11:01 AM      Result Value Range Status   Urine Culture, Routine Culture, Urine   Final   Comment: Final - ===== COLONY COUNT: =====     NO GROWTH     NO GROWTH  CULTURE, BLOOD (SINGLE)     Status: None   Collection Time    09/21/13 11:07 AM      Result Value Range Status   Blood Culture, Routine Culture, Blood   Final   Comment: Final - ===== FINAL REPORT =====NO GROWTH 5 DAYS  CULTURE, BLOOD (ROUTINE X 2)     Status: None   Collection Time    09/23/13  1:36 PM      Result Value Range Status   Specimen Description BLOOD PORTA CATH   Final   Special Requests BOTTLES DRAWN AEROBIC AND ANAEROBIC 4ML   Final   Culture  Setup Time     Final   Value: 09/23/2013 20:25     Performed at [facility]   Culture     Final   Value:        BLOOD CULTURE RECEIVED NO GROWTH TO DATE CULTURE WILL BE HELD FOR 5 DAYS BEFORE ISSUING A FINAL NEGATIVE REPORT     Performed at [facility]   Report Status PENDING   Incomplete  CULTURE, BLOOD (ROUTINE X 2)     Status: None   Collection Time    09/23/13  2:05 PM      Result Value Range Status   Specimen Description BLOOD LEFT HAND   Final   Special Requests BOTTLES DRAWN AEROBIC ONLY 3ML   Final   Culture  Setup Time     Final   Value: 09/23/2013 20:26     Performed at [facility]   Culture     Final   Value:        BLOOD CULTURE RECEIVED NO GROWTH TO DATE CULTURE WILL BE HELD FOR 5 DAYS BEFORE ISSUING A FINAL NEGATIVE REPORT     Performed at [facility]   Report Status PENDING   Incomplete  CULTURE, EXPECTORATED SPUTUM-ASSESSMENT     Status: None   Collection Time    09/23/13  7:59 PM      Result  Value Range Status   Specimen Description SPUTUM   Final   Special Requests Immunocompromised   Final   Sputum evaluation     Final   Value: THIS SPECIMEN IS ACCEPTABLE. RESPIRATORY CULTURE REPORT TO FOLLOW.   Report Status 09/23/2013  FINAL   Final  CULTURE, RESPIRATORY (NON-EXPECTORATED)     Status: None   Collection Time    09/23/13  7:59 PM      Result Value Range Status   Specimen Description SPUTUM   Final   Special Requests NONE   Final   Gram Stain     Final   Value: MODERATE WBC PRESENT,BOTH PMN AND MONONUCLEAR     NO SQUAMOUS EPITHELIAL CELLS SEEN     MODERATE GRAM POSITIVE COCCI IN PAIRS     RARE GRAM NEGATIVE RODS     RARE GRAM POSITIVE RODS   Culture     Final   Value: NORMAL OROPHARYNGEAL FLORA     Performed at [facility]   Report Status 09/26/2013 FINAL   Final      Studies:  No results found.  Assessment: 68 y.o. with history of triple-negative invasive ductal carcinoma of the left breast. Patient has received neoadjuvant chemotherapy. Most recently she received Taxol and carboplatinum. She has had fevers for about one to 2 weeks. She's been treated as an outpatient without any response to the fever curve. Therefore she was admitted to [facility] for further evaluation. She's had a CT of the chest and neck which did not show any acute findings. She was started on broad-spectrum antibiotics. She did defervesce. She has now been off of antibiotics and 24 hours. Infectious diseases consultation was obtained. Clinically now she looks much better. All of her cultures have been negative to date. Certainly this could have been just a viral illness.    Plan:   #1 breast cancer: Patient will be seen by Dr. Lucia Gaskins for discussion of surgical options. No further chemotherapy is planned neoadjuvant daily.  #2 fevers: Now resolved patient is off antibiotics on observation only we appreciate infectious diseases consultation and workup as well as a [facility].  #3 anemia: Patient is asymptomatic we will continue to monitor. This is likely due to her chemotherapy.  #4 disposition: Patient may be discharged to home. If she is I will see her back on January 19 for followup.   Marcy Panning 09/28/2013

## 2013-09-28 NOTE — Consult Note (Signed)
Re: Ellen Beltran  DOB: 01/12/1946  MRN: [id number]   Consult - [facility]  ASSESSMENT AND PLAN:  1. Left breast cancer, T2, N0, 12 o'clock   IDC with papillary features, ER - 0%, PR - 0%, Her2Neu - neg, Ki67 - 47%   2.6 x 2.5 cm on MRI - 08/22/2013  Oncology - [facility]   Plan: 1), 2) Neoadjuvant chemotx (A/C x 4, followed by Taxol/Carboplatin x 12 weekly cycles) 3) lumpectomy and SLNBx, 4) Radiation tx    She has had 17/20 treatments.  I have talked to Dr. K. Khan.  She thinks that she has given the patient as much chemotx as she can tolerate and plans to stop.  Ms. Viti's last chemo was about one month ago.  She is hospitalized now for a febrile illness.  I again reviewed with the patient and her husband the options of mastectomy vs lumpectomy.  She wants to go ahead with lumpectomy.  We talked about margins and the need for post op radiation therapy.  I also discussed sentinel lymph node.  I discussed complications of both operations including bleeding, infection, nerve injury, the need for more surgery, and lymphedema.  I want to give her at least 2 weeks from this febrile episode before scheduling her.  2. Power port - 05/02/2013 - D. Airen Dales   The power port has done well.  3. HTN  4. Hypercholesterolemia  5.  FUO  Patient hospitalized from 09/23/2012 - 09/19/2013 at [facility].  Blood cultures, no plan for antibiotics at discharge.  WBC minimally elevated to 12,600 on 09/27/2013.  Chief Complaint   Patient presents with   .  Routine Post Op     p/o PAC placement    REFERRING PHYSICIAN: WILSON,FRED HENRY, MD   HISTORY OF PRESENT ILLNESS:  Ellen Beltran is a 68 y.o. (DOB: 08/01/1946) white female whose primary care physician is WILSON,FRED HENRY, MD and is at [facility] with a FUO. Her husband is with her.   She has completed 17/20 chemotx treatments.  Because of her febrile illness, Dr. Khan is planning no further chemotx.  Therefore, I was consulted to discuss further plans  for surgery.  I reviewed with her the options of treatment - mastectomy vs lumpectomy.  I answered questions.  We will schedule her in 2 to 3 weeks, to give her time to get over this FUO.  Breast Cancer History:  She went for a routine mammogram 03/30/2013 at [facility]. She had a suspicious area at 12 o'clock in the left breast. Her last mammogram was 2012.  A biopsy 04/15/2013 revealed (Accession: [id number]) triple negative invasive cancer with papillary features.  An MRI on 04/25/2013 show a 2.4 x 2.4 x 1.3 cm mass at the 12 o'clock position of the left breast.  She is on hormones, which she has stopped. She has no family history of breast cancer. She had a hysterectomy in 2002 for benign disease.   Past Medical History   Diagnosis  Date   .  Hypertension    .  Hyperlipidemia    .  Osteopenia    .  Arthritis    .  Hot flashes    .  Cancer      left breast    Current Outpatient Prescriptions   Medication  Sig  Dispense  Refill   .  ciprofloxacin (CIPRO) 500 MG tablet  Take 1 tablet (500 mg total) by mouth 2 (two) times daily.  14   tablet  0   .  dexamethasone (DECADRON) 4 MG tablet  Take 2 tablets (8 mg total) by mouth 2 (two) times daily. BID x 3 days after chemo  60 tablet  1   .  lidocaine-prilocaine (EMLA) cream  Apply topically as needed.  30 g  6   .  LORazepam (ATIVAN) 0.5 MG tablet  Take 1 tablet (0.5 mg total) by mouth every 6 (six) hours as needed (Nausea or vomiting).  30 tablet  0   .  ondansetron (ZOFRAN) 8 MG tablet  Take 8 mg by mouth every 8 (eight) hours as needed (Take 72 hours after chemotherapy).     .  PRESCRIPTION MEDICATION  Chemotherapy at [facility].     .  prochlorperazine (COMPAZINE) 10 MG tablet  Take 10 mg by mouth every 8 (eight) hours as needed.     .  simvastatin (ZOCOR) 40 MG tablet  Take 40 mg by mouth every evening.      No current facility-administered medications for this visit.    Allergies   Allergen  Reactions   .  Codeine  Other (See Comments)      Sees unusual things   .  Morphine And Related  Nausea Only   .  Sulfa Antibiotics  Rash    REVIEW OF SYSTEMS:  Cardiac: Hypertension since about 2004.  Gastrointestinal: No history of stomach disease. No history of liver disease. No history of gall bladder disease. No history of pancreas disease. Negative colonoscopy by Dr. Perry 2014.  Urologic: No history of kidney stones. No history of bladder infections.  GYN: Hysterectomy 2002. Sees Dr. Neal from a Gyn standpoint.   SOCIAL and FAMILY HISTORY:  Married.  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  Her mother had lung cancer and needed a porta cath.  Her husband had hernia surgery by Dr. Tsuie.   PHYSICAL EXAM:  BP 138/58  Pulse 93  Temp(Src) 98.4 F (36.9 C) (Oral)  Resp 16  Ht 4' 10" (1.473 m)  Wt 110 lb 8 oz (50.122 kg)  BMI 23.10 kg/m2  SpO2 96%  General: WN WF who is alert.  No hair.  HEENT: Pupils equal.  NECK: Supple. No thyroid mass.  LYMPH NODES: No cervical, supraclavicular, or axillary adenopathy.  BREASTS - RIGHT: Port upper inner quad. No mass.  The port is accessed with an IV.  LEFT: No palpable mass or nodule. No nipple discharge.  LUNGS:  Clear HEART:  RRR UPPER EXTREMITIES: No evidence of lymphedema. He skin is slightly tanned.   DATA REVIEWED:  Epic notes.   Shakirah Kirkey, MD, FACS  [facility]  [address]  [place], [place] [address]  Phone: [phone] FAX: [phone]  

## 2013-09-29 LAB — CULTURE, BLOOD (ROUTINE X 2)
Culture: NO GROWTH
Culture: NO GROWTH

## 2013-09-30 ENCOUNTER — Encounter (INDEPENDENT_AMBULATORY_CARE_PROVIDER_SITE_OTHER): Payer: Medicare Other | Admitting: Surgery

## 2013-10-03 ENCOUNTER — Telehealth: Payer: Self-pay | Admitting: *Deleted

## 2013-10-03 NOTE — Telephone Encounter (Signed)
Patient is aware that she has an appointment with Dr. Humphrey Rolls tomorrow, 10/04/13 at 11:00am. Patient verbalized understanding.

## 2013-10-04 ENCOUNTER — Encounter: Payer: Self-pay | Admitting: Oncology

## 2013-10-04 ENCOUNTER — Telehealth: Payer: Self-pay | Admitting: Oncology

## 2013-10-04 ENCOUNTER — Ambulatory Visit (HOSPITAL_BASED_OUTPATIENT_CLINIC_OR_DEPARTMENT_OTHER): Payer: Medicare Other | Admitting: Oncology

## 2013-10-04 VITALS — BP 128/75 | HR 114 | Temp 98.1°F | Resp 18 | Ht <= 58 in | Wt 108.1 lb

## 2013-10-04 DIAGNOSIS — C50119 Malignant neoplasm of central portion of unspecified female breast: Secondary | ICD-10-CM

## 2013-10-04 DIAGNOSIS — Z171 Estrogen receptor negative status [ER-]: Secondary | ICD-10-CM

## 2013-10-04 DIAGNOSIS — C50919 Malignant neoplasm of unspecified site of unspecified female breast: Secondary | ICD-10-CM

## 2013-10-04 DIAGNOSIS — D638 Anemia in other chronic diseases classified elsewhere: Secondary | ICD-10-CM

## 2013-10-04 NOTE — Progress Notes (Signed)
[facility]  Telephone:([phone]) [phone] Fax:([phone]) [phone]  OFFICE PROGRESS NOTE   ID: Ellen Beltran   DOB: 03-22-1946  MR#: [id number]  UQJ#:[id number]   PCP: Woody Seller, MD   DIAGNOSIS: Ellen Beltran is a 68 y.o. female diagnosed in 04/2013 with a clinical stage IIA, estrogen receptor negative, progesterone receptor negative, HER-2/neu neg, invasive ductal carcinoma of the left breast.   PRIOR THERAPY: 1. Patient went for annual screening mammogram in 03/2013 and mammography noted a mass at the 12 o'clock position. Diagnostic mammogram and ultrasound of the left breast was performed on 03/30/2013.  The ultrasound showed a 1.8 cm mass in the 12 o'clock position of the left breast.  Bilateral breast MRI performed on 04/25/2013 showed a 2.4 cm area of concern in the 12 o'clock position of the left breast. There were no abnormal appearing axillary subpectoral or internal mammary lymph nodes.  Left breast needle core biopsy performed on 04/15/2013 revealed invasive carcinoma with papillary features consistent with a ductal phenotype. Tumor grade was intermediate to high grade, estrogen receptor negative, progesterone receptor negative, with a proliferation marker Ki-67 of 47%, HER-2/neu by CISH showed no amplification.  2.  2-D echocardiogram performed on 05/06/2013 showed a left ventricular ejection fraction of 60%.  3. Neoadjuvant chemotherapy consisting of AC (Adriamycin/Cytoxan) began on 05/13/2013 through 06/24/2013 x 4 cycles with Neulasta injection for granulocyte support administered on day 2 of each cycle.    4.  Neoadjuvant chemotherapy consisting of Taxol/Carboplatin  started on 07/08/2013 - 09/02/13 completed 9/12 planned. Discontiued to due neuropathy/rashes.  CURRENT THERAPY: proceed with surgery  INTERVAL HISTORY: Ellen Beltran 68 y.o. female returns for evaluation today.  She was recently hospitalized 2 weeks ago for fevers unknown origin. She was  treated with broad-spectrum antibiotics. Her fevers have now subsided. We have decided to discontinue her chemotherapy due to to significant intolerance rashes neuropathy weakness fatigue. She feels much better since being off of chemotherapy and fevers having subsided now. She was seen by Dr. Alphonsa Overall in the hospital. He will be taking her to the OR on every 2 2015 4 lumpectomy versus a mastectomy. I do think initially they're attempting to do a lumpectomy with sentinel lymph node biopsy. I reviewed this with the patient and her husband. We will plan on keeping the port in for now. I have discussed this with Dr. Lucia Gaskins. Remainder of the 10 point review of systems is negative.  MEDICAL HISTORY: Past Medical History  Diagnosis Date  . Hypertension   . Hyperlipidemia   . Osteopenia   . Arthritis   . Hot flashes   . Cancer     left breast    ALLERGIES:   Allergies  Allergen Reactions  . Codeine Other (See Comments)    Sees unusual things  . Morphine And Related Nausea Only  . Sulfa Antibiotics Rash    MEDICATIONS:  Current Outpatient Prescriptions  Medication Sig Dispense Refill  . Benfotiamine 150 MG CAPS Take 150 mg by mouth daily.       . Biotin 5000 MCG CAPS Take by mouth.      . gabapentin (NEURONTIN) 100 MG capsule Take 1 capsule (100 mg total) by mouth 3 (three) times daily.  90 capsule  0  . Multiple Vitamins-Minerals (ALIVE WOMENS 50+ PO) Take 2 each by mouth daily. Take 2 gummies per day      . simvastatin (ZOCOR) 40 MG tablet Take 40 mg by mouth every evening.      Marland Kitchen  acetaminophen (TYLENOL) 325 MG tablet Take 2 tablets (650 mg total) by mouth every 6 (six) hours as needed for mild pain (or Fever >/= 101).      Marland Kitchen lidocaine-prilocaine (EMLA) cream Apply topically as needed.  30 g  6  . prochlorperazine (COMPAZINE) 10 MG tablet Take 10 mg by mouth every 8 (eight) hours as needed.        No current facility-administered medications for this visit.    SURGICAL HISTORY:   Past Surgical History  Procedure Laterality Date  . Cardiac catheterization  2004  . Tubal ligation  age 45  . Total abdominal hysterectomy  age 20 or 47  . Breast biopsy Left aug 2014  . Portacath placement N/A 05/02/2013    Procedure: INSERTION PORT-A-CATH;  Surgeon: Shann Medal, MD;  Location: [facility];  Service: General;  Laterality: N/A;    REVIEW OF SYSTEMS:  A 10 point review of systems was completed and is negative except as noted above.    PHYSICAL EXAMINATION: Blood pressure 128/75, pulse 114, temperature 98.1 F (36.7 C), temperature source Oral, resp. rate 18, height '4\' 10"'  (1.473 m), weight 108 lb 1.6 oz (49.034 kg). Body mass index is 22.6 kg/(m^2). GENERAL: Patient is a well appearing female in no acute distress HEENT:  Sclerae anicteric.  Oropharynx clear and moist. No ulcerations or evidence of oropharyngeal candidiasis. Neck is supple.  NODES:  No cervical, supraclavicular, or axillary lymphadenopathy palpated.  BREAST EXAM:  Deferred. LUNGS:  Clear to auscultation bilaterally.  No wheezes or rhonchi. HEART:  Regular rate and rhythm. No murmur appreciated. ABDOMEN:  Soft, nontender.  Positive, normoactive bowel sounds. No organomegaly palpated. MSK:  No focal spinal tenderness to palpation. Full range of motion bilaterally in the upper extremities. EXTREMITIES:  No peripheral edema.  2+ pulses bilaterally.  SKIN:  Clear with no obvious rashes or skin changes. No nail dyscrasia. Vitiligo. NEURO:  Nonfocal. Well oriented.  Appropriate affect. ECOG PERFORMANCE STATUS: 1 - Symptomatic but completely ambulatory  LABORATORY DATA: Lab Results  Component Value Date   WBC 12.6* 09/27/2013   HGB 8.8* 09/27/2013   HCT 27.2* 09/27/2013   MCV 90.7 09/27/2013   PLT 426* 09/27/2013      Chemistry      Component Value Date/Time   NA 143 09/27/2013 0505   NA 136 09/23/2013 0941   K 3.2* 09/27/2013 0505   K 3.8 09/23/2013 0941   CL 105 09/27/2013 0505   CO2 27 09/27/2013 0505    CO2 26 09/23/2013 0941   BUN 18 09/27/2013 0505   BUN 11.1 09/23/2013 0941   CREATININE 0.61 09/27/2013 0505   CREATININE 0.7 09/23/2013 0941      Component Value Date/Time   CALCIUM 8.4 09/27/2013 0505   CALCIUM 9.7 09/23/2013 0941   ALKPHOS 61 09/24/2013 0548   ALKPHOS 76 09/23/2013 0941   AST 57* 09/24/2013 0548   AST 56* 09/23/2013 0941   ALT 100* 09/24/2013 0548   ALT 123* 09/23/2013 0941   BILITOT 0.2* 09/24/2013 0548   BILITOT 0.28 09/23/2013 0941       RADIOGRAPHIC STUDIES: No results found.  ASSESSMENT: Ellen Beltran is a 68 y.o. female with:  #1 Screening mammogram in 03/2013 noted a mass at the 12 o'clock position.  Diagnostic mammogram and ultrasound of the left breast was performed on 03/30/2013.  The ultrasound showed a 1.8 cm mass in the 12 o'clock position of the left breast. Bilateral breast MRI  performed on 04/25/2013  showed a 2.4 cm area of concern in the 12 o'clock position of the left breast. There were no abnormal appearing axillary subpectoral or internal mammary lymph nodes.  Left breast needle core biopsy performed on 04/15/2013 revealed invasive carcinoma with papillary features consistent with a ductal phenotype. Tumor grade was intermediate to high grade, estrogen receptor negative, progesterone receptor negative, with a proliferation marker Ki-67 of 47%, HER-2/neu by CISH showed no amplification.  #2  2-D echocardiogram performed on 05/06/2013 showed a left ventricular ejection fraction of 60%.  #3 Neoadjuvant chemotherapy consisting of AC (Adriamycin/Cytoxan) began on 05/13/2013 through 06/24/2013 x 4 cycles with Neulasta injection for granulocyte support administered on day 2 of each cycle.    #4  Neoadjuvant chemotherapy consisting of Taxol/Carboplatinstarted on 07/08/2013 through 09/02/2013.   #5 patient is now gone to proceed with definitive surgery. On her recent MRI of the breast there was residual disease. However from my examination I am unable to palpate the  mass.  PLAN:   #1 proceed with definitive surgery.  #2 patient will be seen back in February 2015 after the surgery.  All questions answered. Mr. and Mrs. Cazarez were encouraged to contact us in the interim with any questions, concerns, or problems.  I spent 20 minutes counseling the patient face to face.  The total time spent in the appointment was 30 minutes.  Marcy Panning, MD Medical/Oncology [facility] [phone] (beeper) [phone] (Office)  10/04/2013, 12:07 PM

## 2013-10-06 ENCOUNTER — Encounter (INDEPENDENT_AMBULATORY_CARE_PROVIDER_SITE_OTHER): Payer: Medicare Other | Admitting: Surgery

## 2013-10-06 ENCOUNTER — Other Ambulatory Visit: Payer: Self-pay | Admitting: Adult Health

## 2013-10-11 ENCOUNTER — Encounter (HOSPITAL_BASED_OUTPATIENT_CLINIC_OR_DEPARTMENT_OTHER): Payer: Self-pay | Admitting: *Deleted

## 2013-10-11 NOTE — Pre-Procedure Instructions (Signed)
History and lab results from 09/27/2013 reviewed with Dr. Al Corpus; pt. needs to come 10/13/2013 for BMET and CBC; pt. notified of same.

## 2013-10-11 NOTE — Telephone Encounter (Signed)
o

## 2013-10-13 ENCOUNTER — Encounter (HOSPITAL_BASED_OUTPATIENT_CLINIC_OR_DEPARTMENT_OTHER)
Admission: RE | Admit: 2013-10-13 | Discharge: 2013-10-13 | Disposition: A | Discharge status: Home / Self Care | Payer: Medicare Other | Source: Ambulatory Visit | Attending: Surgery | Admitting: Surgery

## 2013-10-13 DIAGNOSIS — Z01818 Encounter for other preprocedural examination: Secondary | ICD-10-CM | POA: Insufficient documentation

## 2013-10-13 DIAGNOSIS — Z01812 Encounter for preprocedural laboratory examination: Secondary | ICD-10-CM | POA: Insufficient documentation

## 2013-10-13 LAB — CBC
HCT: 33.9 % — ABNORMAL LOW (ref 36.0–46.0)
Hemoglobin: 10.8 g/dL — ABNORMAL LOW (ref 12.0–15.0)
MCH: 29.6 pg (ref 26.0–34.0)
MCHC: 31.9 g/dL (ref 30.0–36.0)
MCV: 92.9 fL (ref 78.0–100.0)
PLATELETS: 359 10*3/uL (ref 150–400)
RBC: 3.65 MIL/uL — AB (ref 3.87–5.11)
RDW: 17.5 % — AB (ref 11.5–15.5)
WBC: 9.3 10*3/uL (ref 4.0–10.5)

## 2013-10-13 LAB — BASIC METABOLIC PANEL
BUN: 12 mg/dL (ref 6–23)
CHLORIDE: 102 meq/L (ref 96–112)
CO2: 29 meq/L (ref 19–32)
Calcium: 9.9 mg/dL (ref 8.4–10.5)
Creatinine, Ser: 0.62 mg/dL (ref 0.50–1.10)
GFR calc non Af Amer: >90 mL/min (ref >90)
Glucose, Bld: 156 mg/dL — ABNORMAL HIGH (ref 70–99)
POTASSIUM: 4.4 meq/L (ref 3.7–5.3)
SODIUM: 145 meq/L (ref 137–147)

## 2013-10-13 NOTE — Pre-Procedure Instructions (Signed)
Dr. Al Corpus notified of Hgb. result of 10.8, drawn today; pt. OK to come for surgery.

## 2013-10-14 ENCOUNTER — Telehealth (INDEPENDENT_AMBULATORY_CARE_PROVIDER_SITE_OTHER): Payer: Self-pay

## 2013-10-14 NOTE — Telephone Encounter (Signed)
Cindy at [facility] called wanting Dr Lucia Gaskins to review the abn preop labs on pt for surgery 2-2. Glenda advised and will review with Dr Lucia Gaskins.

## 2013-10-17 ENCOUNTER — Ambulatory Visit
Admission: RE | Admit: 2013-10-17 | Discharge: 2013-10-17 | Disposition: A | Discharge status: Home / Self Care | Payer: Medicare Other | Source: Ambulatory Visit | Attending: Surgery | Admitting: Surgery

## 2013-10-17 ENCOUNTER — Ambulatory Visit (HOSPITAL_BASED_OUTPATIENT_CLINIC_OR_DEPARTMENT_OTHER): Payer: Medicare Other | Admitting: Anesthesiology

## 2013-10-17 ENCOUNTER — Encounter (HOSPITAL_BASED_OUTPATIENT_CLINIC_OR_DEPARTMENT_OTHER): Payer: Self-pay | Admitting: *Deleted

## 2013-10-17 ENCOUNTER — Encounter (HOSPITAL_COMMUNITY)
Admission: RE | Admit: 2013-10-17 | Discharge: 2013-10-17 | Disposition: A | Discharge status: Home / Self Care | Payer: Medicare Other | Source: Ambulatory Visit | Attending: Surgery | Admitting: Surgery

## 2013-10-17 ENCOUNTER — Encounter (HOSPITAL_BASED_OUTPATIENT_CLINIC_OR_DEPARTMENT_OTHER)
Admission: RE | Disposition: A | Discharge status: Home / Self Care | Payer: Self-pay | Source: Ambulatory Visit | Attending: Surgery

## 2013-10-17 ENCOUNTER — Ambulatory Visit (HOSPITAL_BASED_OUTPATIENT_CLINIC_OR_DEPARTMENT_OTHER)
Admission: RE | Admit: 2013-10-17 | Discharge: 2013-10-17 | Disposition: A | Discharge status: Home / Self Care | Payer: Medicare Other | Source: Ambulatory Visit | Attending: Surgery | Admitting: Surgery

## 2013-10-17 ENCOUNTER — Encounter (HOSPITAL_BASED_OUTPATIENT_CLINIC_OR_DEPARTMENT_OTHER): Payer: Medicare Other | Admitting: Anesthesiology

## 2013-10-17 DIAGNOSIS — C50119 Malignant neoplasm of central portion of unspecified female breast: Secondary | ICD-10-CM

## 2013-10-17 DIAGNOSIS — C50919 Malignant neoplasm of unspecified site of unspecified female breast: Secondary | ICD-10-CM

## 2013-10-17 DIAGNOSIS — M129 Arthropathy, unspecified: Secondary | ICD-10-CM | POA: Insufficient documentation

## 2013-10-17 DIAGNOSIS — C50912 Malignant neoplasm of unspecified site of left female breast: Secondary | ICD-10-CM

## 2013-10-17 DIAGNOSIS — M899 Disorder of bone, unspecified: Secondary | ICD-10-CM | POA: Insufficient documentation

## 2013-10-17 DIAGNOSIS — I10 Essential (primary) hypertension: Secondary | ICD-10-CM | POA: Insufficient documentation

## 2013-10-17 DIAGNOSIS — Z9221 Personal history of antineoplastic chemotherapy: Secondary | ICD-10-CM | POA: Insufficient documentation

## 2013-10-17 DIAGNOSIS — M949 Disorder of cartilage, unspecified: Secondary | ICD-10-CM

## 2013-10-17 DIAGNOSIS — E7800 Pure hypercholesterolemia, unspecified: Secondary | ICD-10-CM | POA: Insufficient documentation

## 2013-10-17 HISTORY — DX: Drug-induced polyneuropathy: T45.1X5A

## 2013-10-17 HISTORY — DX: Personal history of other medical treatment: Z92.89

## 2013-10-17 HISTORY — DX: Presence of dental prosthetic device (complete) (partial): Z97.2

## 2013-10-17 HISTORY — DX: Adverse effect of antineoplastic and immunosuppressive drugs, initial encounter: G62.0

## 2013-10-17 HISTORY — DX: Personal history of antineoplastic chemotherapy: Z92.21

## 2013-10-17 HISTORY — PX: BREAST LUMPECTOMY: SHX2

## 2013-10-17 HISTORY — PX: BREAST LUMPECTOMY WITH NEEDLE LOCALIZATION AND AXILLARY SENTINEL LYMPH NODE BX: SHX5760

## 2013-10-17 SURGERY — BREAST LUMPECTOMY WITH NEEDLE LOCALIZATION AND AXILLARY SENTINEL LYMPH NODE BX
Anesthesia: General | Laterality: Left

## 2013-10-17 MED ORDER — LACTATED RINGERS IV SOLN: INTRAVENOUS | Status: DC

## 2013-10-17 MED ORDER — LACTATED RINGERS IV SOLN
INTRAVENOUS | Status: DC
Start: 2013-10-17 — End: 2013-10-17
  Administered 2013-10-17 (×2): via INTRAVENOUS

## 2013-10-17 MED ORDER — OXYCODONE HCL 5 MG PO TABS
5.0000 mg | ORAL_TABLET | ORAL | Status: DC | PRN
  Administered 2013-10-17: 5 mg via ORAL

## 2013-10-17 MED ORDER — FENTANYL CITRATE 0.05 MG/ML IJ SOLN
INTRAMUSCULAR | Status: DC | PRN
  Administered 2013-10-17 (×2): 50 ug via INTRAVENOUS

## 2013-10-17 MED ORDER — PROPOFOL 10 MG/ML IV BOLUS
INTRAVENOUS | Status: DC | PRN
  Administered 2013-10-17: 150 mg via INTRAVENOUS

## 2013-10-17 MED ORDER — FENTANYL CITRATE 0.05 MG/ML IJ SOLN
INTRAMUSCULAR | Status: AC
  Filled 2013-10-17: qty 2

## 2013-10-17 MED ORDER — CEFAZOLIN SODIUM-DEXTROSE 2-3 GM-% IV SOLR
INTRAVENOUS | Status: AC
  Filled 2013-10-17: qty 50

## 2013-10-17 MED ORDER — DEXAMETHASONE SODIUM PHOSPHATE 4 MG/ML IJ SOLN
INTRAMUSCULAR | Status: DC | PRN
  Administered 2013-10-17: 10 mg via INTRAVENOUS

## 2013-10-17 MED ORDER — HYDROCODONE-ACETAMINOPHEN 5-325 MG PO TABS: 1.0000 | ORAL_TABLET | Freq: Four times a day (QID) | ORAL | Status: DC | PRN

## 2013-10-17 MED ORDER — FENTANYL CITRATE 0.05 MG/ML IJ SOLN
25.0000 ug | INTRAMUSCULAR | Status: DC | PRN
  Administered 2013-10-17 (×2): 25 ug via INTRAVENOUS

## 2013-10-17 MED ORDER — TECHNETIUM TC 99M SULFUR COLLOID FILTERED
1.0000 | Freq: Once | INTRAVENOUS | Status: AC | PRN
  Administered 2013-10-17: 1 via INTRADERMAL

## 2013-10-17 MED ORDER — CHLORHEXIDINE GLUCONATE 4 % EX LIQD: 1.0000 "application " | Freq: Once | CUTANEOUS | Status: DC

## 2013-10-17 MED ORDER — MIDAZOLAM HCL 2 MG/2ML IJ SOLN
1.0000 mg | INTRAMUSCULAR | Status: DC | PRN
  Administered 2013-10-17: 2 mg via INTRAVENOUS

## 2013-10-17 MED ORDER — FENTANYL CITRATE 0.05 MG/ML IJ SOLN
INTRAMUSCULAR | Status: AC
  Filled 2013-10-17: qty 6

## 2013-10-17 MED ORDER — BUPIVACAINE HCL (PF) 0.25 % IJ SOLN
INTRAMUSCULAR | Status: AC
  Filled 2013-10-17: qty 30

## 2013-10-17 MED ORDER — SODIUM CHLORIDE 0.9 % IJ SOLN
INTRAMUSCULAR | Status: AC
  Filled 2013-10-17: qty 10

## 2013-10-17 MED ORDER — OXYCODONE HCL 5 MG PO TABS
ORAL_TABLET | ORAL | Status: AC
  Filled 2013-10-17: qty 1

## 2013-10-17 MED ORDER — MIDAZOLAM HCL 2 MG/2ML IJ SOLN
INTRAMUSCULAR | Status: AC
  Filled 2013-10-17: qty 2

## 2013-10-17 MED ORDER — FENTANYL CITRATE 0.05 MG/ML IJ SOLN: 50.0000 ug | INTRAMUSCULAR | Status: DC | PRN

## 2013-10-17 MED ORDER — LIDOCAINE HCL (CARDIAC) 20 MG/ML IV SOLN
INTRAVENOUS | Status: DC | PRN
  Administered 2013-10-17: 75 mg via INTRAVENOUS

## 2013-10-17 MED ORDER — METHYLENE BLUE 1 % INJ SOLN
INTRAMUSCULAR | Status: AC
  Filled 2013-10-17: qty 10

## 2013-10-17 MED ORDER — FENTANYL CITRATE 0.05 MG/ML IJ SOLN
50.0000 ug | INTRAMUSCULAR | Status: DC | PRN
  Administered 2013-10-17: 100 ug via INTRAVENOUS

## 2013-10-17 MED ORDER — CEFAZOLIN SODIUM-DEXTROSE 2-3 GM-% IV SOLR
2.0000 g | INTRAVENOUS | Status: AC
  Administered 2013-10-17: 2 g via INTRAVENOUS

## 2013-10-17 MED ORDER — ONDANSETRON HCL 4 MG/2ML IJ SOLN
INTRAMUSCULAR | Status: DC | PRN
  Administered 2013-10-17: 4 mg via INTRAVENOUS

## 2013-10-17 MED ORDER — MIDAZOLAM HCL 2 MG/ML PO SYRP: 12.0000 mg | ORAL_SOLUTION | Freq: Once | ORAL | Status: DC | PRN

## 2013-10-17 MED ORDER — MIDAZOLAM HCL 5 MG/5ML IJ SOLN
INTRAMUSCULAR | Status: DC | PRN
  Administered 2013-10-17: 1 mg via INTRAVENOUS

## 2013-10-17 MED ORDER — EPHEDRINE SULFATE 50 MG/ML IJ SOLN
INTRAMUSCULAR | Status: DC | PRN
  Administered 2013-10-17: 10 mg via INTRAVENOUS

## 2013-10-17 MED ORDER — ONDANSETRON HCL 4 MG/2ML IJ SOLN: 4.0000 mg | Freq: Four times a day (QID) | INTRAMUSCULAR | Status: DC | PRN

## 2013-10-17 MED ORDER — BUPIVACAINE-EPINEPHRINE PF 0.25-1:200000 % IJ SOLN
INTRAMUSCULAR | Status: AC
  Filled 2013-10-17: qty 30

## 2013-10-17 MED ORDER — BUPIVACAINE HCL (PF) 0.25 % IJ SOLN
INTRAMUSCULAR | Status: DC | PRN
  Administered 2013-10-17: 30 mL

## 2013-10-17 MED ORDER — MIDAZOLAM HCL 2 MG/2ML IJ SOLN: 1.0000 mg | INTRAMUSCULAR | Status: DC | PRN

## 2013-10-17 SURGICAL SUPPLY — 62 items
ADH SKN CLS APL DERMABOND .7 (GAUZE/BANDAGES/DRESSINGS) ×1
APPLIER CLIP 11 MED OPEN (CLIP) ×2
APR CLP MED 11 20 MLT OPN (CLIP) ×1
BANDAGE ELASTIC 6 VELCRO ST LF (GAUZE/BANDAGES/DRESSINGS) IMPLANT
BINDER BREAST LRG (GAUZE/BANDAGES/DRESSINGS) IMPLANT
BINDER BREAST MEDIUM (GAUZE/BANDAGES/DRESSINGS) IMPLANT
BINDER BREAST XLRG (GAUZE/BANDAGES/DRESSINGS) IMPLANT
BINDER BREAST XXLRG (GAUZE/BANDAGES/DRESSINGS) IMPLANT
BLADE SURG 10 STRL SS (BLADE) ×2 IMPLANT
BLADE SURG 15 STRL LF DISP TIS (BLADE) ×1 IMPLANT
BLADE SURG 15 STRL SS (BLADE) ×2
CANISTER SUCT 1200ML W/VALVE (MISCELLANEOUS) ×2 IMPLANT
CHLORAPREP W/TINT 26ML (MISCELLANEOUS) ×2 IMPLANT
CLIP APPLIE 11 MED OPEN (CLIP) IMPLANT
CLIP TI WIDE RED SMALL 6 (CLIP) ×1 IMPLANT
COVER MAYO STAND STRL (DRAPES) ×2 IMPLANT
COVER PROBE W GEL 5X96 (DRAPES) ×2 IMPLANT
COVER TABLE BACK 60X90 (DRAPES) ×2 IMPLANT
DECANTER SPIKE VIAL GLASS SM (MISCELLANEOUS) IMPLANT
DERMABOND ADVANCED (GAUZE/BANDAGES/DRESSINGS) ×1
DERMABOND ADVANCED .7 DNX12 (GAUZE/BANDAGES/DRESSINGS) ×1 IMPLANT
DEVICE DUBIN W/COMP PLATE 8390 (MISCELLANEOUS) ×2 IMPLANT
DRAIN CHANNEL 19F RND (DRAIN) IMPLANT
DRAIN HEMOVAC 1/8 X 5 (WOUND CARE) IMPLANT
DRAPE LAPAROSCOPIC ABDOMINAL (DRAPES) ×2 IMPLANT
DRAPE UTILITY XL STRL (DRAPES) ×2 IMPLANT
ELECT COATED BLADE 2.86 ST (ELECTRODE) ×2 IMPLANT
ELECT REM PT RETURN 9FT ADLT (ELECTROSURGICAL)
ELECTRODE REM PT RTRN 9FT ADLT (ELECTROSURGICAL) ×1 IMPLANT
EVACUATOR SILICONE 100CC (DRAIN) IMPLANT
GAUZE SPONGE 4X4 16PLY XRAY LF (GAUZE/BANDAGES/DRESSINGS) IMPLANT
GLOVE BIO SURGEON STRL SZ 6.5 (GLOVE) ×2 IMPLANT
GLOVE SURG SIGNA 7.5 PF LTX (GLOVE) ×2 IMPLANT
GOWN STRL REUS W/ TWL LRG LVL3 (GOWN DISPOSABLE) ×1 IMPLANT
GOWN STRL REUS W/ TWL XL LVL3 (GOWN DISPOSABLE) ×1 IMPLANT
GOWN STRL REUS W/TWL LRG LVL3 (GOWN DISPOSABLE) ×2
GOWN STRL REUS W/TWL XL LVL3 (GOWN DISPOSABLE) ×2
KIT MARKER MARGIN INK (KITS) ×1 IMPLANT
NDL HYPO 25X1 1.5 SAFETY (NEEDLE) ×2 IMPLANT
NDL SAFETY ECLIPSE 18X1.5 (NEEDLE) ×1 IMPLANT
NEEDLE HYPO 18GX1.5 SHARP (NEEDLE)
NEEDLE HYPO 25X1 1.5 SAFETY (NEEDLE) ×2 IMPLANT
NS IRRIG 1000ML POUR BTL (IV SOLUTION) ×2 IMPLANT
PACK BASIN DAY SURGERY FS (CUSTOM PROCEDURE TRAY) ×2 IMPLANT
PAD ALCOHOL SWAB (MISCELLANEOUS) ×2 IMPLANT
PENCIL BUTTON HOLSTER BLD 10FT (ELECTRODE) ×2 IMPLANT
PIN SAFETY STERILE (MISCELLANEOUS) IMPLANT
SHEET MEDIUM DRAPE 40X70 STRL (DRAPES) ×2 IMPLANT
SLEEVE SCD COMPRESS KNEE MED (MISCELLANEOUS) ×2 IMPLANT
SPONGE GAUZE 4X4 12PLY (GAUZE/BANDAGES/DRESSINGS) IMPLANT
SPONGE GAUZE 4X4 12PLY STER LF (GAUZE/BANDAGES/DRESSINGS) IMPLANT
SPONGE LAP 18X18 X RAY DECT (DISPOSABLE) ×2 IMPLANT
SUT ETHILON 2 0 FS 18 (SUTURE) IMPLANT
SUT MON AB 5-0 PS2 18 (SUTURE) ×2 IMPLANT
SUT SILK 3 0 TIES 17X18 (SUTURE)
SUT SILK 3-0 18XBRD TIE BLK (SUTURE) IMPLANT
SUT VICRYL 3-0 CR8 SH (SUTURE) ×2 IMPLANT
SYR CONTROL 10ML LL (SYRINGE) ×3 IMPLANT
TOWEL OR 17X24 6PK STRL BLUE (TOWEL DISPOSABLE) ×4 IMPLANT
TOWEL OR NON WOVEN STRL DISP B (DISPOSABLE) ×2 IMPLANT
TUBE CONNECTING 20X1/4 (TUBING) ×2 IMPLANT
YANKAUER SUCT BULB TIP NO VENT (SUCTIONS) ×2 IMPLANT

## 2013-10-17 NOTE — H&P (View-Only) (Signed)
Re: Ellen Beltran  DOB: 18-Sep-1945  MRN: [id number]   Consult - [facility]  ASSESSMENT AND PLAN:  1. Left breast cancer, T2, N0, 12 o'clock   IDC with papillary features, ER - 0%, PR - 0%, Her2Neu - neg, Ki67 - 47%   2.6 x 2.5 cm on MRI - 08/22/2013  Oncology - Humphrey Rolls and [place]: 1), 2) Neoadjuvant chemotx (A/C x 4, followed by Taxol/Carboplatin x 12 weekly cycles) 3) lumpectomy and SLNBx, 4) Radiation tx    She has had 17/20 treatments.  I have talked to Dr. Tami Lin.  She thinks that she has given the patient as much chemotx as she can tolerate and plans to stop.  Ellen Beltran last chemo was about one month ago.  She is hospitalized now for a febrile illness.  I again reviewed with the patient and her husband the options of mastectomy vs lumpectomy.  She wants to go ahead with lumpectomy.  We talked about margins and the need for post op radiation therapy.  I also discussed sentinel lymph node.  I discussed complications of both operations including bleeding, infection, nerve injury, the need for more surgery, and lymphedema.  I want to give her at least 2 weeks from this febrile episode before scheduling her.  2. Power port - 05/02/2013 - D. Daily Crate   The power port has done well.  3. HTN  4. Hypercholesterolemia  5.  FUO  Patient hospitalized from 09/23/2012 - 09/19/2013 at [facility].  Blood cultures, no plan for antibiotics at discharge.  WBC minimally elevated to 12,600 on 09/27/2013.  Chief Complaint   Patient presents with   .  Routine Post Op     p/o PAC placement    REFERRING PHYSICIAN: Woody Seller, MD   HISTORY OF PRESENT ILLNESS:  Ellen Beltran is a 68 y.o. (DOB: 10-20-1945) white female whose primary care physician is Woody Seller, MD and is at [facility] with a FUO. Her husband is with her.   She has completed 17/20 chemotx treatments.  Because of her febrile illness, Dr. Humphrey Rolls is planning no further chemotx.  Therefore, I was consulted to discuss further plans  for surgery.  I reviewed with her the options of treatment - mastectomy vs lumpectomy.  I answered questions.  We will schedule her in 2 to 3 weeks, to give her time to get over this FUO.  Breast Cancer History:  She went for a routine mammogram 03/30/2013 at [facility]. She had a suspicious area at 12 o'clock in the left breast. Her last mammogram was 2012.  A biopsy 04/15/2013 revealed (Accession: [id number]) triple negative invasive cancer with papillary features.  An MRI on 04/25/2013 show a 2.4 x 2.4 x 1.3 cm mass at the 12 o'clock position of the left breast.  She is on hormones, which she has stopped. She has no family history of breast cancer. She had a hysterectomy in 2002 for benign disease.   Past Medical History   Diagnosis  Date   .  Hypertension    .  Hyperlipidemia    .  Osteopenia    .  Arthritis    .  Hot flashes    .  Cancer      left breast    Current Outpatient Prescriptions   Medication  Sig  Dispense  Refill   .  ciprofloxacin (CIPRO) 500 MG tablet  Take 1 tablet (500 mg total) by mouth 2 (two) times daily.  [place]  tablet  0   .  dexamethasone (DECADRON) 4 MG tablet  Take 2 tablets (8 mg total) by mouth 2 (two) times daily. BID x 3 days after chemo  60 tablet  1   .  lidocaine-prilocaine (EMLA) cream  Apply topically as needed.  30 g  6   .  LORazepam (ATIVAN) 0.5 MG tablet  Take 1 tablet (0.5 mg total) by mouth every 6 (six) hours as needed (Nausea or vomiting).  30 tablet  0   .  ondansetron (ZOFRAN) 8 MG tablet  Take 8 mg by mouth every 8 (eight) hours as needed (Take 72 hours after chemotherapy).     .  PRESCRIPTION MEDICATION  Chemotherapy at [facility].     .  prochlorperazine (COMPAZINE) 10 MG tablet  Take 10 mg by mouth every 8 (eight) hours as needed.     .  simvastatin (ZOCOR) 40 MG tablet  Take 40 mg by mouth every evening.      No current facility-administered medications for this visit.    Allergies   Allergen  Reactions   .  Codeine  Other (See Comments)      Sees unusual things   .  Morphine And Related  Nausea Only   .  Sulfa Antibiotics  Rash    REVIEW OF SYSTEMS:  Cardiac: Hypertension since about 2004.  Gastrointestinal: No history of stomach disease. No history of liver disease. No history of gall bladder disease. No history of pancreas disease. Negative colonoscopy by Dr. Perry 2014.  Urologic: No history of kidney stones. No history of bladder infections.  GYN: Hysterectomy 2002. Sees Dr. Neal from a Gyn standpoint.   SOCIAL and FAMILY HISTORY:  Married.  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  Her mother had lung cancer and needed a porta cath.  Her husband had hernia surgery by Dr. Tsuie.   PHYSICAL EXAM:  BP 138/58  Pulse 93  Temp(Src) 98.4 F (36.9 C) (Oral)  Resp 16  Ht 4' 10" (1.473 m)  Wt 110 lb 8 oz (50.122 kg)  BMI 23.10 kg/m2  SpO2 96%  General: WN WF who is alert.  No hair.  HEENT: Pupils equal.  NECK: Supple. No thyroid mass.  LYMPH NODES: No cervical, supraclavicular, or axillary adenopathy.  BREASTS - RIGHT: Port upper inner quad. No mass.  The port is accessed with an IV.  LEFT: No palpable mass or nodule. No nipple discharge.  LUNGS:  Clear HEART:  RRR UPPER EXTREMITIES: No evidence of lymphedema. He skin is slightly tanned.   DATA REVIEWED:  Epic notes.   David Newman, MD, FACS  [facility]  [address]  [place], [place] [address]  Phone: [phone] FAX: [phone]  

## 2013-10-17 NOTE — Anesthesia Preprocedure Evaluation (Signed)
Anesthesia Evaluation  Patient identified by MRN, date of birth, ID band Patient awake    Reviewed: Allergy & Precautions, H&P , NPO status , Patient's Chart, lab work & pertinent test results  Airway Mallampati: II  Neck ROM: full    Dental   Pulmonary neg pulmonary ROS,          Cardiovascular negative cardio ROS      Neuro/Psych    GI/Hepatic   Endo/Other    Renal/GU      Musculoskeletal  (+) Arthritis -,   Abdominal   Peds  Hematology   Anesthesia Other Findings   Reproductive/Obstetrics Breast CA                           Anesthesia Physical Anesthesia Plan  ASA: II  Anesthesia Plan: General   Post-op Pain Management:    Induction: Intravenous  Airway Management Planned: LMA  Additional Equipment:   Intra-op Plan:   Post-operative Plan:   Informed Consent: I have reviewed the patients History and Physical, chart, labs and discussed the procedure including the risks, benefits and alternatives for the proposed anesthesia with the patient or authorized representative who has indicated his/her understanding and acceptance.     Plan Discussed with: CRNA, Anesthesiologist and Surgeon  Anesthesia Plan Comments:         Anesthesia Quick Evaluation

## 2013-10-17 NOTE — Interval H&P Note (Signed)
History and Physical Interval Note:  10/17/2013 1:55 PM  Ellen Beltran  has presented today for surgery, with the diagnosis of left breast cancer  The various methods of treatment have been discussed with the patient and family.  The husband is with the patient.  She is doing much better than I when I last saw her.  After consideration of risks, benefits and other options for treatment, the patient has consented to  Procedure(s): BREAST LUMPECTOMY WITH NEEDLE LOCALIZATION AND AXILLARY SENTINEL LYMPH NODE BX (Left) as a surgical intervention .   The patient's history has been reviewed, patient examined, no change in status, stable for surgery.  I have reviewed the patient's chart and labs.  Questions were answered to the patient's satisfaction.     Ashvik Grundman H

## 2013-10-17 NOTE — Op Note (Signed)
10/17/2013  3:57 PM  PATIENT:  Ellen Beltran DOB: [birth year] MRN: [id number]  PREOP DIAGNOSIS:  left breast cancer  POSTOP DIAGNOSIS:   Left breast cancer , 12 o'clock position (T2, N0)  PROCEDURE:   Procedure(s): BREAST LUMPECTOMY WITH NEEDLE LOCALIZATION AND AXILLARY SENTINEL LYMPH NODE BX (Counts 600/background 0)  SURGEON:   Alphonsa Overall, M.D.  ANESTHESIA:   general  Anesthesiologist: Albertha Ghee, MD CRNA: Terrance Mass, CRNA; Willa Frater, CRNA  General  EBL:  50  ml  DRAINS: none   LOCAL MEDICATIONS USED:   30 cc 1/4 marcaine  SPECIMEN:   Left breast lumpectomy and left axillary sentinel lymph node  COUNTS CORRECT:  YES  INDICATIONS FOR PROCEDURE:  CARLINDA Beltran is a 68 y.o. (DOB: 09-22-1945) white  female whose primary care physician is Woody Seller, MD and comes for left  breast lumpectomy and left axillary sentinel lymph node biopsy. She has completed neoadjuvant chemotx by Dr. Humphrey Rolls.   Options for breast cancer treatment were discussed with the patient. She elected to proceed with lumpectomy and axillary sentinel lymph node.     The indications and potential complications of surgery were explained to the patient. Potential complications include, but are not limited to, bleeding, infection, the need for further surgery, and nerve injury.     The patient had 2 needle loc wires placed at the 12 o'clock position of the left breast at [facility] by Dr. Lutricia Horsfall.  In the holding area, her left areola was injected with 1 millicurie of Technitium Sulfur Colloid.  OPERATIVE NOTE:   The patient was taken to room # 5 at [facility] where she underwent a general anesthesia  supervised by Anesthesiologist: Albertha Ghee, MD CRNA: Terrance Mass, CRNA; Willa Frater, CRNA. Her left breast and axilla were prepped with  ChloraPrep and sterilely draped.    A time-out and the surgical check list was reviewed.    l started with the left axillary sentinel  lymph node biopsy. I found a hot area at the junction of the breast and the pectoralis major muscle. I cut down and  identified a hot node that had counts of 600 and the background has 0 counts. I checked her internal mammary nodes and supraclavicular nodes with the neoprobe and found no other hot area. The axillary node was then sent to pathology.    I turned attention to the cancer which was about at the 12 o'clock position of the left breast. I cut down around the 2 wires and tried to take an ellipse of breast tissue around the tumor by at least 1 cm.   I excised this block of breast tissue approximately 5 cm by 5 cm  in diameter. I did take the dissection down to the pectoralis major. I painted the lumpectomy specimen with the 6 color paint kit and did a specimen mammogram which confirmed the mass, clip and the wire were all in the right position.  I sent the specimen to pathology for gross inspection.  Dr. Chelsea Primus called back and said that he saw no gross tumor left behind.   I then irrigated the wound with saline. I infiltrated approximately 30 mL of 1% local between the  Incisions.  I placed 5 clips to mark biopsy cavity, at 12, 3, 6, and 9 o'clock. One clip was placed on the pectoralis major.   I then closed all the wounds in layers using 3-0 Vicryl sutures for the  deep layer. At the skin, I closed the incisions with a 5-0 Monocryl suture. The incisions were then painted with Dermabond.  She had gauze place over the wounds and placed in a breast binder.   The patient tolerated the procedure well, was transported to the recovery room in good condition. Sponge and needle count were correct at the end of the case.   Final pathology is pending.   Alphonsa Overall, MD, [facility] Pager: [phone] Office phone:  [phone]

## 2013-10-17 NOTE — Anesthesia Procedure Notes (Addendum)
Performed by: Melynda Ripple D   Procedure Name: LMA Insertion Date/Time: 10/17/2013 2:05 PM Performed by: Melynda Ripple D Pre-anesthesia Checklist: Patient identified, Emergency Drugs available, Suction available and Patient being monitored Patient Re-evaluated:Patient Re-evaluated prior to inductionOxygen Delivery Method: Circle System Utilized Preoxygenation: Pre-oxygenation with 100% oxygen Intubation Type: IV induction Ventilation: Mask ventilation without difficulty LMA: LMA inserted LMA Size: 3.0 Grade View: Grade I Number of attempts: 1 Airway Equipment and Method: bite block Placement Confirmation: positive ETCO2 Tube secured with: Tape Dental Injury: Teeth and Oropharynx as per pre-operative assessment

## 2013-10-17 NOTE — Progress Notes (Signed)
Assisted with lt breast nuc med injection

## 2013-10-17 NOTE — Discharge Instructions (Signed)
[facility] - DISCHARGE INSTRUCTIONS TO PATIENT  Activity:  Driving - May drive in 2 or 3 days, if doing well.   Lifting - No lifting > 15 pounds for 5 days, then no limit  Wound Care:   Leave dressing of for 48 hours, then may shower.  Diet:  As tolerated.  Follow up appointment:  You have an appointment.  Call Dr. Pollie Friar office [facility]) at [phone] for any questions.  Medications and dosages:  Resume your home medications.  You have a prescription for:  Vicodin  Call Dr. Lucia Gaskins or his office  [phone]) if you have:  Temperature greater than 100.4,  Persistent nausea and vomiting,  Severe uncontrolled pain,  Redness, tenderness, or signs of infection (pain, swelling, redness, odor or green/yellow discharge around the site),  Difficulty breathing, headache or visual disturbances,  Any other questions or concerns you may have after discharge.  In an emergency, call 911 or go to an Emergency Department at a nearby hospital.    Post Anesthesia Home Care Instructions  Activity: Get plenty of rest for the remainder of the day. A responsible adult should stay with you for 24 hours following the procedure.  For the next 24 hours, DO NOT: -Drive a car -Paediatric nurse -Drink alcoholic beverages -Take any medication unless instructed by your physician -Make any legal decisions or sign important papers.  Meals: Start with liquid foods such as gelatin or soup. Progress to regular foods as tolerated. Avoid greasy, spicy, heavy foods. If nausea and/or vomiting occur, drink only clear liquids until the nausea and/or vomiting subsides. Call your physician if vomiting continues.  Special Instructions/Symptoms: Your throat may feel dry or sore from the anesthesia or the breathing tube placed in your throat during surgery. If this causes discomfort, gargle with warm salt water. The discomfort should disappear within 24 hours.

## 2013-10-17 NOTE — Transfer of Care (Signed)
Immediate Anesthesia Transfer of Care Note  Patient: Ellen Beltran  Procedure(s) Performed: Procedure(s): BREAST LUMPECTOMY WITH NEEDLE LOCALIZATION AND AXILLARY SENTINEL LYMPH NODE BX (Left)  Patient Location: PACU  Anesthesia Type:General  Level of Consciousness: awake and sedated  Airway & Oxygen Therapy: Patient Spontanous Breathing and Patient connected to face mask oxygen  Post-op Assessment: Report given to PACU RN and Post -op Vital signs reviewed and stable  Post vital signs: Reviewed and stable  Complications: No apparent anesthesia complications

## 2013-10-17 NOTE — Anesthesia Postprocedure Evaluation (Signed)
Anesthesia Post Note  Patient: Ellen Beltran  Procedure(s) Performed: Procedure(s) (LRB): BREAST LUMPECTOMY WITH NEEDLE LOCALIZATION AND AXILLARY SENTINEL LYMPH NODE BX (Left)  Anesthesia type: General  Patient location: PACU  Post pain: Pain level controlled and Adequate analgesia  Post assessment: Post-op Vital signs reviewed, Patient's Cardiovascular Status Stable, Respiratory Function Stable, Patent Airway and Pain level controlled  Last Vitals:  Filed Vitals:   10/17/13 1630  BP: 126/65  Pulse: 100  Temp:   Resp: 14    Post vital signs: Reviewed and stable  Level of consciousness: awake, alert  and oriented  Complications: No apparent anesthesia complications

## 2013-10-19 ENCOUNTER — Encounter (HOSPITAL_BASED_OUTPATIENT_CLINIC_OR_DEPARTMENT_OTHER): Payer: Self-pay | Admitting: Surgery

## 2013-10-21 ENCOUNTER — Telehealth (INDEPENDENT_AMBULATORY_CARE_PROVIDER_SITE_OTHER): Payer: Self-pay | Admitting: Surgery

## 2013-10-21 NOTE — Telephone Encounter (Signed)
Discussed pathology report with patient. She is doing well. She already has appointments with Dr. Humphrey Rolls and me.  She'll call to make an appointment with Dr. Pablo Ledger.  Nydia Bouton

## 2013-11-04 ENCOUNTER — Telehealth: Payer: Self-pay | Admitting: *Deleted

## 2013-11-04 ENCOUNTER — Other Ambulatory Visit (HOSPITAL_BASED_OUTPATIENT_CLINIC_OR_DEPARTMENT_OTHER): Payer: Medicare Other

## 2013-11-04 ENCOUNTER — Telehealth: Payer: Self-pay | Admitting: Oncology

## 2013-11-04 ENCOUNTER — Ambulatory Visit
Admission: RE | Admit: 2013-11-04 | Discharge: 2013-11-04 | Disposition: A | Discharge status: Home / Self Care | Payer: Medicare Other | Source: Ambulatory Visit | Attending: Radiation Oncology | Admitting: Radiation Oncology

## 2013-11-04 ENCOUNTER — Encounter: Payer: Self-pay | Admitting: Oncology

## 2013-11-04 ENCOUNTER — Ambulatory Visit (HOSPITAL_BASED_OUTPATIENT_CLINIC_OR_DEPARTMENT_OTHER): Payer: Medicare Other | Admitting: Oncology

## 2013-11-04 VITALS — BP 131/76 | HR 80 | Temp 98.1°F | Resp 18 | Ht <= 58 in | Wt 111.8 lb

## 2013-11-04 VITALS — BP 131/76 | HR 18 | Temp 98.1°F | Wt 111.1 lb

## 2013-11-04 DIAGNOSIS — D638 Anemia in other chronic diseases classified elsewhere: Secondary | ICD-10-CM

## 2013-11-04 DIAGNOSIS — R5381 Other malaise: Secondary | ICD-10-CM

## 2013-11-04 DIAGNOSIS — Z510 Encounter for antineoplastic radiation therapy: Secondary | ICD-10-CM | POA: Insufficient documentation

## 2013-11-04 DIAGNOSIS — C50119 Malignant neoplasm of central portion of unspecified female breast: Secondary | ICD-10-CM

## 2013-11-04 DIAGNOSIS — C50919 Malignant neoplasm of unspecified site of unspecified female breast: Secondary | ICD-10-CM

## 2013-11-04 DIAGNOSIS — R635 Abnormal weight gain: Secondary | ICD-10-CM | POA: Insufficient documentation

## 2013-11-04 DIAGNOSIS — G589 Mononeuropathy, unspecified: Secondary | ICD-10-CM

## 2013-11-04 DIAGNOSIS — Z79899 Other long term (current) drug therapy: Secondary | ICD-10-CM | POA: Insufficient documentation

## 2013-11-04 DIAGNOSIS — Z885 Allergy status to narcotic agent status: Secondary | ICD-10-CM | POA: Insufficient documentation

## 2013-11-04 DIAGNOSIS — R5383 Other fatigue: Secondary | ICD-10-CM

## 2013-11-04 DIAGNOSIS — N63 Unspecified lump in unspecified breast: Secondary | ICD-10-CM | POA: Insufficient documentation

## 2013-11-04 DIAGNOSIS — Z882 Allergy status to sulfonamides status: Secondary | ICD-10-CM | POA: Insufficient documentation

## 2013-11-04 DIAGNOSIS — Z170 Estrogen receptor positive status [ER+]: Secondary | ICD-10-CM

## 2013-11-04 DIAGNOSIS — L259 Unspecified contact dermatitis, unspecified cause: Secondary | ICD-10-CM | POA: Insufficient documentation

## 2013-11-04 LAB — COMPREHENSIVE METABOLIC PANEL (CC13)
ALT: 20 U/L (ref 0–55)
AST: 20 U/L (ref 5–34)
Albumin: 3.9 g/dL (ref 3.5–5.0)
Alkaline Phosphatase: 74 U/L (ref 40–150)
Anion Gap: 11 mEq/L (ref 3–11)
BUN: 15 mg/dL (ref 7.0–26.0)
CALCIUM: 10.3 mg/dL (ref 8.4–10.4)
CHLORIDE: 104 meq/L (ref 98–109)
CO2: 26 meq/L (ref 22–29)
Creatinine: 0.7 mg/dL (ref 0.6–1.1)
GLUCOSE: 109 mg/dL (ref 70–140)
Potassium: 4 mEq/L (ref 3.5–5.1)
Sodium: 141 mEq/L (ref 136–145)
Total Bilirubin: 0.22 mg/dL (ref 0.20–1.20)
Total Protein: 7.5 g/dL (ref 6.4–8.3)

## 2013-11-04 LAB — CBC WITH DIFFERENTIAL/PLATELET
BASO%: 1.3 % (ref 0.0–2.0)
BASOS ABS: 0.1 10*3/uL (ref 0.0–0.1)
EOS ABS: 0.2 10*3/uL (ref 0.0–0.5)
EOS%: 2.2 % (ref 0.0–7.0)
HCT: 34.3 % — ABNORMAL LOW (ref 34.8–46.6)
HEMOGLOBIN: 11.1 g/dL — AB (ref 11.6–15.9)
LYMPH#: 2.9 10*3/uL (ref 0.9–3.3)
LYMPH%: 32.8 % (ref 14.0–49.7)
MCH: 29.2 pg (ref 25.1–34.0)
MCHC: 32.3 g/dL (ref 31.5–36.0)
MCV: 90.3 fL (ref 79.5–101.0)
MONO#: 1 10*3/uL — AB (ref 0.1–0.9)
MONO%: 11.7 % (ref 0.0–14.0)
NEUT#: 4.7 10*3/uL (ref 1.5–6.5)
NEUT%: 52 % (ref 38.4–76.8)
Platelets: 380 10*3/uL (ref 145–400)
RBC: 3.8 10*6/uL (ref 3.70–5.45)
RDW: 18.8 % — ABNORMAL HIGH (ref 11.2–14.5)
WBC: 8.9 10*3/uL (ref 3.9–10.3)

## 2013-11-04 MED ORDER — GABAPENTIN 100 MG PO CAPS: ORAL_CAPSULE | ORAL | Status: DC

## 2013-11-04 NOTE — Progress Notes (Signed)
Name: Ellen Beltran   MRN: [id number]  Date:  11/04/2013  DOB: 1945/10/18  Status:outpatient   DIAGNOSIS: Left Breast cancer.  CONSENT VERIFIED: yes SET UP: Patient is setup supine  IMMOBILIZATION:  The following immobilization was used:Custom Moldable Pillow, breast board.  NARRATIVE: [name] was brought to the [facility].  Identity was confirmed.  All relevant records and images related to the planned course of therapy were reviewed.  Then, the patient was positioned in a stable reproducible clinical set-up for radiation therapy.  Wires were placed to delineate the clinical extent of breast tissue. A wire was placed on the scar as well.  CT images were obtained.  An isocenter was placed. Skin markings were placed.  The position of the heart was then analyzed.  Due to the proximity of the heart to the chest wall, I felt she would benefit from deep inspiration breath hold for cardiac sparing.  She was then coached and rescanned in the breath hold position.  Acceptable cardiac sparing was achieved. The CT images were loaded into the planning software where the target and avoidance structures were contoured.  The radiation prescription was entered and confirmed. The patient was discharged in stable condition and tolerated simulation well.    TREATMENT PLANNING NOTE/3D Simulation Note Treatment planning then occurred. I have requested : MLC's, isodose plan, basic dose calculation  3D simulation was performed.  I personally designed and supervised the construction of 3 medically necessary complex treatment devices in the form of MLCs which will be used for beam modification and to protect critical structures including the heart and lung as well as the immobilization device which is necessary for reproducible set up.  I have requested a dose volume histogram of the heart, lung and tumor cavity.    RESPIRATORY MOTION MANAGEMENT SIMULATION - Deep Inspiration Breath  Hold  NARRATIVE:  In order to account for effect of respiratory motion on target structures and other organs in the planning and delivery of radiotherapy, this patient underwent respiratory motion management simulation.  To accomplish this, when the patient was brought to the CT simulation planning suite, a bellows was placed on the her abdomen.  Wave forms of the patient's breathing were obtained. Coaching was performed and practice sessions initiated to monitor her ability to obtain and maintain deep inspiration breath hold.  The CT images were loaded into the planning software and fused with her free breathing images by physics.  Acceptable cardiac sparing was achieved through the use of deep inspiration breath hold.  Planning will be performed on her breath hold scan

## 2013-11-04 NOTE — Progress Notes (Signed)
Location of Breast Cancer:Invasive ductal ca of left breast.1.9 cm in 12 o'clock position.  Histology per Pathology Report:10/17/2013 Diagnosis 1. Breast, lumpectomy, Left - FOCAL INVASIVE DUCTAL CARCINOMA ASSOCIATED WITH FIBROSIS AND INFLAMMATION. - MARGINS NOT INVOLVED. 2. Lymph node, sentinel, biopsy, Left Axillary #1 - ONE BENIGN LYMPH NODE (0/1). Microscopic  Receptor Status: ER(+) PR (-), Her2-neu (-)  Did patient present with symptoms, asymptomatic, found through screening mammogram.  Past/Anticipated interventions by surgeon, if YPE:JYLT breast lumpectomy on 10/17/2013.  Past/Anticipated interventions by medical oncology, if any: Chemotherapy;Completed chemotherapy 09/02/13  9 of 12 cycles Taxol/Carboplatin.Discontinued due to neuropathy. Completed 4 cycles of Adriamycin/Cytoxan on 06/24/13.  Lymphedema issues, if EIH:DTPNS swelling of left arm.   Pain issues, if any:No  SAFETY ISSUES:  Prior radiation? No  Pacemaker/ICD?No  Possible current pregnancy?No  Is the patient on methotrexate? No  Current Complaints / other details:patient here with husband.Menses age 20.Children x 2 via vaginal birth.HRT for approximately 13.    Arlyss Repress, RN 11/04/2013,2:32 PM

## 2013-11-04 NOTE — Progress Notes (Signed)
   Department of Radiation Oncology  Phone:  [phone] Fax:        [phone]   Name: ROAN SAWCHUK MRN: [id number]  DOB: Jul 06, 1946  Date: 11/04/2013  Follow Up Visit Note  Diagnosis: T2N0 Left Breast Cancer  Interval History: [name] presents today for routine followup.  He completed neoadjuvant chemotherapy. She developed neuropathy and fatigued. It she had her lumpectomy on 2-15 which showed a partial pathologic response with focal invasive ductal carcinoma identified with negative margins and 0 out of one lymph node was positive. Interestingly her tumor was ER positive at 52% PR negative and HER-2 negative. Her initial tumor had been triple negative. She presents today to begin her radiation.  Allergies:  Allergies  Allergen Reactions  . Codeine Other (See Comments)    HALLUCINATIONS  . Morphine And Related Nausea Only  . Sulfa Antibiotics Rash    Medications:  Current Outpatient Prescriptions  Medication Sig Dispense Refill  . Benfotiamine 150 MG CAPS Take 150 mg by mouth daily.       . Biotin 5000 MCG CAPS Take by mouth.      . gabapentin (NEURONTIN) 100 MG capsule Take 2 capsules three times a day  180 capsule  6  . Multiple Vitamins-Minerals (ALIVE WOMENS 50+ PO) Take 2 each by mouth daily. Take 2 gummies per day      . simvastatin (ZOCOR) 40 MG tablet Take 40 mg by mouth every evening.       No current facility-administered medications for this encounter.    Physical Exam:  Filed Vitals:   11/04/13 1430  BP: 131/76  Pulse: 18  Temp: 98.1 F (36.7 C)  Weight: 111 lb 1.6 oz (50.395 kg)   pleasant female in no distress sitting comfortably on examining table.  Her left breast is slightly swollen as compared to the right  IMPRESSION: Ellen Beltran is a 68 y.o. female status post lumpectomy and neoadjuvant chemotherapy now ready for radiation  PLAN:  We discussed the role of radiation and decreasing local failures in patients who undergo lumpectomy. . For this reason  I have recommended radiation to the whole breast followed by boost to the tumor bed. We discussed the process of simulation the placement tattoos. We discussed possible side effects during treatment including but not limited to skin irritation darkness and fatigue. We discussed long-term effects of treatment which are extremely unlikely but possible including damage to the lungs and ribs. We discussed the low likelihood of secondary malignancies. We discussed the use of breath hold technique for cardiac sparing. I let her know that nursing would be doing skin teaching with her. She signed informed consent and agree to proceed forward. We were able to fit her in for simulation today.     Thea Silversmith, MD

## 2013-11-04 NOTE — Telephone Encounter (Signed)
, °

## 2013-11-04 NOTE — Progress Notes (Signed)
Radiation Oncology         [phone]) [phone] ________________________________  Name: Ellen Beltran      MRN: [id number]          Date: 11/04/2013              DOB: 11-11-1945  Optical Surface Tracking Plan:  Since intensity modulated radiotherapy (IMRT) and 3D conformal radiation treatment methods are predicated on accurate and precise positioning for treatment, intrafraction motion monitoring is medically necessary to ensure accurate and safe treatment delivery.  The ability to quantify intrafraction motion without excessive ionizing radiation dose can only be performed with optical surface tracking. Accordingly, surface imaging offers the opportunity to obtain 3D measurements of patient position throughout IMRT and 3D treatments without excessive radiation exposure.  I am ordering optical surface tracking for this patient's upcoming course of radiotherapy. ________________________________ Signature   Reference:   Ursula Alert, J, et al. Surface imaging-based analysis of intrafraction motion for breast radiotherapy patients.Journal of [place], n. 6, nov. 2014. ISSN [id number].   Available at: <http://www.jacmp.org/index.php/jacmp/article/view/4957>.

## 2013-11-04 NOTE — Progress Notes (Signed)
Please see the Nurse Progress Note in the MD Initial Consult Encounter for this patient. 

## 2013-11-04 NOTE — Telephone Encounter (Signed)
Pt came back by to rs her flush to 11/10/13@ 12:30p due to on 11/11/13 she states its long gap. appts made and printed...td

## 2013-11-06 NOTE — Progress Notes (Signed)
[employer] [facility]  [phone] [phone]  OFFICE PROGRESS NOTE   ID: Ellen Beltran   DOB: 15-May-1946  MR#: [id number]  LID#:[id number]   PCP: Woody Seller, MD   DIAGNOSIS: Ellen Beltran is a 68 y.o. female diagnosed in 04/2013 with a clinical stage IIA, estrogen receptor negative, progesterone receptor negative, HER-2/neu neg, invasive ductal carcinoma of the left breast.   PRIOR THERAPY: 1.  annual screening mammogram in 03/2013 and mammography noted a mass at the 12 o'clock position. Diagnostic mammogram and ultrasound of the left breast was performed on 03/30/2013.  The ultrasound showed a 1.8 cm mass in the 12 o'clock position of the left breast.  Bilateral breast MRI performed on 04/25/2013 showed a 2.4 cm area of concern in the 12 o'clock position of the left breast. There were no abnormal appearing axillary subpectoral or internal mammary lymph nodes.  Left breast needle core biopsy performed on 04/15/2013 revealed invasive carcinoma with papillary features consistent with a ductal phenotype. Tumor grade was intermediate to high grade, estrogen receptor negative, progesterone receptor negative, with a proliferation marker Ki-67 of 47%, HER-2/neu by CISH showed no amplification.  2.  2-D echocardiogram performed on 05/06/2013 showed a left ventricular ejection fraction of 60%.  3. S/p Neoadjuvant chemotherapy consisting of AC (Adriamycin/Cytoxan) began on 05/13/2013 through 06/24/2013 x 4 cycles with Neulasta injection for granulocyte support administered on day 2 of each cycle.    4.  S/p Neoadjuvant chemotherapy consisting of Taxol/Carboplatin  started on 07/08/2013 - 09/02/13 completed 9/12 planned. Discontiued to due neuropathy/rashes.  5. S/p left breast lumpectomy with SLN performed on 10/17/13 with the pathology revealing: 1. Breast, lumpectomy, Left - FOCAL INVASIVE DUCTAL CARCINOMA ASSOCIATED WITH FIBROSIS AND INFLAMMATION. 0.4 cm residual  disease - MARGINS NOT INVOLVED. 2. Lymph node, sentinel, biopsy, Left Axillary #1 - ONE BENIGN LYMPH NODE (0/1). Prognostic markers: ER+, PR-, Her2Neu- (prior tumor triple negative)  CURRENT THERAPY: proceed with Radiation  INTERVAL HISTORY: Ellen Beltran 68 y.o. female returns for evaluation today. She had her breast surgery on 10/17/13. She underwent a lumpectomy with SLN. She tolerated the procedure well. She presently has some pain at the surgical site and under the axilla. She also continues to experience peripheral neuropathy in her hands more than her feet. She is on gabapentin but only taking 100 mg three times a day. We discussed the possibility of increasing it a little more to 200 mg three times a day. She is reluctant but agreeable. She has some fatigue and weakness from the surgery and the chemotherapy. She is denying any fevers or chills, no n/v/d. She is beginning eat a little better. Remainder of the 10 point review of systems is negative.  MEDICAL HISTORY: Past Medical History  Diagnosis Date  . Hyperlipidemia   . Osteopenia   . Arthritis   . Cancer     left breast  . Neuropathy due to chemotherapeutic drug     hands and feet  . History of blood transfusion 09/24/2013  . Dental bridge present     left lower  . History of chemotherapy     stopped 08/2013    ALLERGIES:   Allergies  Allergen Reactions  . Codeine Other (See Comments)    HALLUCINATIONS  . Morphine And Related Nausea Only  . Sulfa Antibiotics Rash    MEDICATIONS:  Current Outpatient Prescriptions  Medication Sig Dispense Refill  . Benfotiamine 150 MG CAPS Take 150 mg by mouth daily.       Marland Kitchen  Biotin 5000 MCG CAPS Take by mouth.      . gabapentin (NEURONTIN) 100 MG capsule Take 2 capsules three times a day  180 capsule  6  . Multiple Vitamins-Minerals (ALIVE WOMENS 50+ PO) Take 2 each by mouth daily. Take 2 gummies per day      . simvastatin (ZOCOR) 40 MG tablet Take 40 mg by mouth every evening.        No current facility-administered medications for this visit.    SURGICAL HISTORY:  Past Surgical History  Procedure Laterality Date  . Portacath placement N/A 05/02/2013    Procedure: INSERTION PORT-A-CATH;  Surgeon: Shann Medal, MD;  Location: [facility];  Service: General;  Laterality: N/A;  . Total abdominal hysterectomy w/ bilateral salpingoophorectomy  05/06/2001  . Pelvic floor repair  05/06/2001    cardinal uterosacral colposuspension  . Cardiac catheterization  08/31/2003    no disease  . Colonoscopy with propofol  06/22/2012  . Tubal ligation  age 65  . Breast lumpectomy with needle localization and axillary sentinel lymph node bx Left 10/17/2013    Procedure: BREAST LUMPECTOMY WITH NEEDLE LOCALIZATION AND AXILLARY SENTINEL LYMPH NODE BX;  Surgeon: Shann Medal, MD;  Location: [facility];  Service: General;  Laterality: Left;    REVIEW OF SYSTEMS:  A 10 point review of systems was completed and is negative except as noted above.    PHYSICAL EXAMINATION: Blood pressure 131/76, pulse 80, temperature 98.1 F (36.7 C), temperature source Oral, resp. rate 18, height 4' 9.5" (1.461 m), weight 111 lb 12.8 oz (50.712 kg). Body mass index is 23.76 kg/(m^2). GENERAL: Patient is a well appearing female in no acute distress HEENT:  Sclerae anicteric.  Oropharynx clear and moist. No ulcerations or evidence of oropharyngeal candidiasis. Neck is supple.  NODES:  No cervical, supraclavicular, or axillary lymphadenopathy palpated.  BREAST EXAM:  Deferred. LUNGS:  Clear to auscultation bilaterally.  No wheezes or rhonchi. HEART:  Regular rate and rhythm. No murmur appreciated. ABDOMEN:  Soft, nontender.  Positive, normoactive bowel sounds. No organomegaly palpated. MSK:  No focal spinal tenderness to palpation. Full range of motion bilaterally in the upper extremities. EXTREMITIES:  No peripheral edema.  2+ pulses bilaterally.  SKIN:  Clear with no obvious rashes or skin  changes. No nail dyscrasia. Vitiligo. NEURO:  Nonfocal. Well oriented.  Appropriate affect. ECOG PERFORMANCE STATUS: 1 - Symptomatic but completely ambulatory  LABORATORY DATA: Lab Results  Component Value Date   WBC 8.9 11/04/2013   HGB 11.1* 11/04/2013   HCT 34.3* 11/04/2013   MCV 90.3 11/04/2013   PLT 380 11/04/2013      Chemistry      Component Value Date/Time   NA 141 11/04/2013 1146   NA 145 10/13/2013 0900   K 4.0 11/04/2013 1146   K 4.4 10/13/2013 0900   CL 102 10/13/2013 0900   CO2 26 11/04/2013 1146   CO2 29 10/13/2013 0900   BUN 15.0 11/04/2013 1146   BUN 12 10/13/2013 0900   CREATININE 0.7 11/04/2013 1146   CREATININE 0.62 10/13/2013 0900      Component Value Date/Time   CALCIUM 10.3 11/04/2013 1146   CALCIUM 9.9 10/13/2013 0900   ALKPHOS 74 11/04/2013 1146   ALKPHOS 61 09/24/2013 0548   AST 20 11/04/2013 1146   AST 57* 09/24/2013 0548   ALT 20 11/04/2013 1146   ALT 100* 09/24/2013 0548   BILITOT 0.22 11/04/2013 1146   BILITOT 0.2* 09/24/2013 0548  ADDITIONAL INFORMATION: 1. PROGNOSTIC INDICATORS - ACIS Results: IMMUNOHISTOCHEMICAL AND MORPHOMETRIC ANALYSIS BY THE AUTOMATED CELLULAR IMAGING SYSTEM (ACIS) Estrogen Receptor: 52%, POSITIVE, MODERATE STAINING INTENSITY Progesterone Receptor: 0%, NEGATIVE COMMENT: The negative hormone receptor study(ies) in this case has an internal positive control. REFERENCE RANGE ESTROGEN RECEPTOR NEGATIVE <1% POSITIVE =>1% PROGESTERONE RECEPTOR NEGATIVE <1% POSITIVE =>1% All controls stained appropriately Aldona Bar MD Pathologist, Electronic Signature ( Signed 11/04/2013) 1. CHROMOGENIC IN-SITU HYBRIDIZATION Results: HER-2/NEU BY CISH - NEGATIVE, NO AMPLIFICATION OF HER-2 DETECTED. RESULT RATIO OF HER2: CEP 17 SIGNALS 1.18 AVERAGE HER2 COPY NUMBER PER CELL 2.0 1 of 4 FINAL for KALKIDAN, CAUDELL ([id number]) ADDITIONAL INFORMATION:(continued) REFERENCE RANGE NEGATIVE HER2/Chr17 Ratio <2.0 and Average HER2 copy number  <4.0 EQUIVOCAL HER2/Chr17 Ratio <2.0 and Average HER2 copy number 4.0 and <6.0 POSITIVE HER2/Chr17 Ratio >=2.0 and/or Average HER2 copy number >=6.0 Aldona Bar MD Pathologist, Electronic Signature ( Signed 11/03/2013) FINAL DIAGNOSIS Diagnosis 1. Breast, lumpectomy, Left - FOCAL INVASIVE DUCTAL CARCINOMA ASSOCIATED WITH FIBROSIS AND INFLAMMATION. - MARGINS NOT INVOLVED. 2. Lymph node, sentinel, biopsy, Left Axillary #1 - ONE BENIGN LYMPH NODE (0/1). Microscopic Comment 1. BREAST, INVASIVE TUMOR, WITH LYMPH NODE SAMPLING Specimen, including laterality and lymph node sampling (sentinel, non-sentinel): Left breast lumpectomy and one sentinel lymph node. Procedure: Lumpectomy with sentinel lymph node sampling. Histologic type: Ductal with papillary features. Grade: II Tubule formation: 1 Nuclear pleomorphism: 3 Mitotic:2 Tumor size (glass slide measurement): 0.4 cm Margins: Free of tumor Invasive, distance to closest margin: 0.8 cm from posterior margin. In-situ, distance to closest margin: N/A If margin positive, focally or broadly: N/A Lymphovascular invasion: Not identified. Ductal carcinoma in situ: No. Grade: N/A Extensive intraductal component: No. Lobular neoplasia: No. Tumor focality: Unifocal Treatment effect: Yes. If present, treatment effect in breast tissue, lymph nodes or both: Breast tissue. Extent of tumor: Skin: N/A Nipple: N/A Skeletal muscle: N/A Lymph nodes: Examined: 1 Sentinel 0 Non-sentinel 1 Total Lymph nodes with metastasis: 0 2 of 4 FINAL for ADHYA, COCCO ([id number]) Microscopic Comment(continued) Isolated tumor cells (< 0.2 mm): 0 Micrometastasis: (> 0.2 mm and < 2.0 mm): 0 Macrometastasis: (> 2.0 mm): 0 Extracapsular extension: N/A Breast prognostic profile: [id number] Estrogen receptor: 0%, negative Progesterone receptor: 0%, negative. Her 2 neu: No amplification, ratio 1.01. Ki-67: 47% Non-neoplastic breast: Fibrocystic  changes. TNM: ypT1a, ypN0, yMX Comments: In the area of localization, there is focal residual invasive ductal carcinoma which is estimated at 0.4 cm in greatest dimension which is associated with fibrosis, inflammation and focally abundant histiocytic infiltrates. Immunohistochemistry is performed and the focus of tumor shows lack of basal cell staining with calponin, p63 and smooth muscle myosin. Dr. Nicoletta Dress has reviewed the sections of tumor and agrees. (JDP:gt, 10/19/13) Claudette Laws MD Pathologist, Electronic Signature (Case signed 10/20/2013) Intraoperative Diagnosis 1. RAPID INTRAOPERATIVE  RADIOGRAPHIC STUDIES: No results found.  ASSESSMENT/PLAN: Ellen Beltran is a 69 y.o. female with:  #1 Screening mammogram in 03/2013 noted a mass at the 12 o'clock position.  Diagnostic mammogram and ultrasound of the left breast was performed on 03/30/2013.  The ultrasound showed a 1.8 cm mass in the 12 o'clock position of the left breast. Bilateral breast MRI  performed on 04/25/2013 showed a 2.4 cm area of concern in the 12 o'clock position of the left breast. There were no abnormal appearing axillary subpectoral or internal mammary lymph nodes.  Left breast needle core biopsy performed on 04/15/2013 revealed invasive carcinoma with papillary features consistent with a ductal phenotype. Tumor grade  was intermediate to high grade, estrogen receptor negative, progesterone receptor negative, with a proliferation marker Ki-67 of 47%, HER-2/neu by CISH showed no amplification.   #2. s/p  Neoadjuvant chemotherapy consisting of AC (Adriamycin/Cytoxan) began on 05/13/2013 through 06/24/2013 x 4 cycles with Neulasta injection for granulocyte support administered on day 2 of each cycle.    #3. s/p  Neoadjuvant chemotherapy consisting of Taxol/Carboplatinstarted on 07/08/2013 through 09/02/2013.   #5 s/p left breast lumpectomy with SLN on 10/17/13 with final pathology showing a 0.4 cm residulal IDC,  ER+PR-HER2NEU-. One sentinel node was negative for metastatic disease.  We discussed the final pathology today. We also discussed that the final pathology revealed the tumor to be ER+, this will be taken advantage of and she will be prescribed an anti-estrogen therapy. We will consider placing her on aromasin 25 mg daily after completion of radiation therapy.  6. Proceed with adjuvant radiation therapy. She will see Dr.  Pablo Ledger today.  7. Neuropathy: increase dose of gabapentin to 200 mg three times a day.  8. Follow up: after completion of radiation therapy All questions answered. Mr. and Mrs. Gahan were encouraged to contact us in the interim with any questions, concerns, or problems.  I spent 30 minutes counseling the patient face to face.  The total time spent in the appointment was 30 minutes.  Marcy Panning, MD Medical/Oncology [facility] [phone] (beeper) [phone] (Office)

## 2013-11-10 ENCOUNTER — Encounter (INDEPENDENT_AMBULATORY_CARE_PROVIDER_SITE_OTHER): Payer: Medicare Other | Admitting: Surgery

## 2013-11-10 ENCOUNTER — Ambulatory Visit (HOSPITAL_BASED_OUTPATIENT_CLINIC_OR_DEPARTMENT_OTHER): Payer: Medicare Other

## 2013-11-10 VITALS — BP 131/62 | HR 93 | Temp 98.2°F

## 2013-11-10 DIAGNOSIS — Z95828 Presence of other vascular implants and grafts: Secondary | ICD-10-CM

## 2013-11-10 DIAGNOSIS — C50919 Malignant neoplasm of unspecified site of unspecified female breast: Secondary | ICD-10-CM

## 2013-11-10 DIAGNOSIS — Z452 Encounter for adjustment and management of vascular access device: Secondary | ICD-10-CM

## 2013-11-10 MED ORDER — HEPARIN SOD (PORK) LOCK FLUSH 100 UNIT/ML IV SOLN
500.0000 [IU] | Freq: Once | INTRAVENOUS | Status: AC
  Administered 2013-11-10: 500 [IU] via INTRAVENOUS
  Filled 2013-11-10: qty 5

## 2013-11-10 MED ORDER — SODIUM CHLORIDE 0.9 % IJ SOLN
10.0000 mL | INTRAMUSCULAR | Status: DC | PRN
  Administered 2013-11-10: 10 mL via INTRAVENOUS
  Filled 2013-11-10: qty 10

## 2013-11-10 NOTE — Patient Instructions (Signed)

## 2013-11-11 ENCOUNTER — Ambulatory Visit
Admission: RE | Admit: 2013-11-11 | Discharge: 2013-11-11 | Disposition: A | Discharge status: Home / Self Care | Payer: Medicare Other | Source: Ambulatory Visit | Attending: Radiation Oncology | Admitting: Radiation Oncology

## 2013-11-11 DIAGNOSIS — C50119 Malignant neoplasm of central portion of unspecified female breast: Secondary | ICD-10-CM

## 2013-11-11 NOTE — Addendum Note (Signed)
Encounter addended by: Deirdre Evener, RN on: 11/11/2013 10:48 AM<BR>     Documentation filed: Charges VN

## 2013-11-11 NOTE — Addendum Note (Signed)
Encounter addended by: Deirdre Evener, RN on: 11/11/2013 10:51 AM<BR>     Documentation filed: Charges VN

## 2013-11-11 NOTE — Progress Notes (Signed)
  Radiation Oncology         [phone] ________________________________  Name: ARIKA MAINER MRN: [id number]  Date: 11/11/2013  DOB: February 20, 1946  Simulation Verification Note  Status: outpatient  NARRATIVE: The patient was brought to the treatment unit and placed in the planned treatment position. The clinical setup was verified. Then port films were obtained and uploaded to the radiation oncology medical record software.  The treatment beams were carefully compared against the planned radiation fields. The position location and shape of the radiation fields was reviewed. The targeted volume of tissue appears appropriately covered by the radiation beams. Organs at risk appear to be excluded as planned.  Based on my personal review, I approved the simulation verification. The patient's treatment will proceed as planned.  ------------------------------------------------  Thea Silversmith, MD

## 2013-11-14 ENCOUNTER — Ambulatory Visit
Admission: RE | Admit: 2013-11-14 | Discharge: 2013-11-14 | Disposition: A | Discharge status: Home / Self Care | Payer: Medicare Other | Source: Ambulatory Visit | Attending: Radiation Oncology | Admitting: Radiation Oncology

## 2013-11-15 ENCOUNTER — Ambulatory Visit
Admission: RE | Admit: 2013-11-15 | Discharge: 2013-11-15 | Disposition: A | Discharge status: Home / Self Care | Payer: Medicare Other | Source: Ambulatory Visit | Attending: Radiation Oncology | Admitting: Radiation Oncology

## 2013-11-15 ENCOUNTER — Ambulatory Visit: Admission: RE | Admit: 2013-11-15 | Payer: Medicare Other | Source: Ambulatory Visit

## 2013-11-15 VITALS — BP 134/80 | HR 76 | Temp 97.9°F | Wt 113.0 lb

## 2013-11-15 DIAGNOSIS — C50119 Malignant neoplasm of central portion of unspecified female breast: Secondary | ICD-10-CM

## 2013-11-15 NOTE — Progress Notes (Addendum)
Patient here for routine weekly assessment.Will complete 2 of 25 treatments today.Machine is back up.Routine of clinic reviewed.Given Radiapex, alra deodorant and Radiation Therapy and You Booklet.reviewed side effects of treatment and how to take care of self.No underwire bra.Using teach back, patient able to state side effects to include skin discoloration, tenderness , swelling and fatigue.Has unscented dove soap and knows to apply radiaplex after radiation treatment and at bedtime.Encouraged to push fluids and may perform activities of daily living with in normal range.

## 2013-11-15 NOTE — Progress Notes (Signed)
Weekly Management Note Current Dose: 3.6  Gy  Projected Dose: 45 Gy   Narrative:  The patient presents for routine under treatment assessment.  CBCT/MVCT images/Port film x-rays were reviewed.  The chart was checked. RN education performed. Having difficulty getting good images. C/o redness around the nipple. No warmth. No fevers.   Physical Findings: Weight: 113 lb (51.256 kg). Paink around nipple. No swelling. No heat.   Impression:  The patient is tolerating radiation.  Plan:  Continue treatment as planned. ? Reaction to SLN dye. No signs of infection really. Probably just healing. Seeing surgeon tomorrow.

## 2013-11-16 ENCOUNTER — Encounter (INDEPENDENT_AMBULATORY_CARE_PROVIDER_SITE_OTHER): Payer: Self-pay | Admitting: Surgery

## 2013-11-16 ENCOUNTER — Ambulatory Visit
Admission: RE | Admit: 2013-11-16 | Discharge: 2013-11-16 | Disposition: A | Discharge status: Home / Self Care | Payer: Medicare Other | Source: Ambulatory Visit | Attending: Radiation Oncology | Admitting: Radiation Oncology

## 2013-11-16 ENCOUNTER — Ambulatory Visit (INDEPENDENT_AMBULATORY_CARE_PROVIDER_SITE_OTHER): Payer: Medicare Other | Admitting: Surgery

## 2013-11-16 VITALS — BP 126/72 | HR 72 | Temp 98.0°F | Resp 18 | Ht 60.0 in | Wt 114.0 lb

## 2013-11-16 DIAGNOSIS — C50119 Malignant neoplasm of central portion of unspecified female breast: Secondary | ICD-10-CM

## 2013-11-16 NOTE — Progress Notes (Addendum)
Re:   Ellen Beltran DOB:   07/20/1946 MRN:   [id number]  [place]  ASSESSMENT AND PLAN: 1.  Left breast cancer, T2, N0, 12 o'clock  Final path (Accession: [id number]) IDC with papillary features,  showed 0.4 cm invasive ductal ca, ER - 52%, PR - 0%, Her2Neu - neg, Ki67 - 47%  (Interestingly, her core biopsy showed triple neg tumor, but her final path shows ER positivity)  2.4 cm on MRI - 05/23/2013 - pre neoadjuvant treatment  Oncology - [facility] Rolls and [facility]  Left breast lumpectomy and left axillary SLNBx - 10/17/2013 - final path   Plan: 1), 4) Radiation tx, 5) Dr. Chancy Milroy plans to put her on Aromasin post rad tx  Seroma in left breast.  Talked to Dr. Pablo Ledger about this.  But I think that this looks okay.  Will leave it alone for now  She knows to call if there is a problem.  She will otherwise see me in 6 months.   2.  Power port - 05/02/2013 - D. Tasha Diaz  The power port has done well.  I told her to talk to Dr. Humphrey Rolls about when to get this out.  [Removed at the [facility] on 02/10/2014.  DN] 3. HTN 4.  Hypercholesterolemia  Chief Complaint  Patient presents with  . Routine Post Op    left breast ca   REFERRING PHYSICIAN: Woody Seller, MD  HISTORY OF PRESENT ILLNESS: Ellen Beltran is a 68 y.o. (DOB: 08-07-1946)  white  female whose primary care physician is Woody Seller, MD and comes for follow up of left breast lumpectomy and left axillary SLNBx. Her follow up has been delayed because of the snow.  She has started rad tx by Dr. Pablo Ledger - 3 treatments so far.  She and Dr. Pablo Ledger were worried about redness to the left breast.  Today, it seems less impressive to her.  She is having no fever and no tenderness other than post op discomfort.  Breast Cancer History: She went for a routine mammogram 03/30/2013 at [facility].  She had a suspicious area at 12 o'clock in the left breast.  Her last mammogram was 2012. A biopsy 04/15/2013 revealed (Accession: [id number])  triple negative invasive cancer with papillary features. An MRI on 04/25/2013 show a 2.4 x 2.4 x 1.3 cm mass at the 12 o'clock position of the left breast. She is on hormones, which she has stopped.  She has no family history of breast cancer.  She had a hysterectomy in 2002 for benign disease.  Past Medical History  Diagnosis Date  . Hyperlipidemia   . Osteopenia   . Arthritis   . Cancer     left breast  . Neuropathy due to chemotherapeutic drug     hands and feet  . History of blood transfusion 09/24/2013  . Dental bridge present     left lower  . History of chemotherapy     stopped 08/2013     Current Outpatient Prescriptions  Medication Sig Dispense Refill  . Benfotiamine 150 MG CAPS Take 150 mg by mouth daily.       . Biotin 5000 MCG CAPS Take by mouth.      . gabapentin (NEURONTIN) 100 MG capsule Take 2 capsules three times a day  180 capsule  6  . Multiple Vitamins-Minerals (ALIVE WOMENS 50+ PO) Take 2 each by mouth daily. Take 2 gummies per day      . simvastatin (ZOCOR) 40 MG tablet Take  40 mg by mouth every evening.       No current facility-administered medications for this visit.    Allergies  Allergen Reactions  . Codeine Other (See Comments)    HALLUCINATIONS  . Morphine And Related Nausea Only  . Sulfa Antibiotics Rash    REVIEW OF SYSTEMS: Cardiac:  Hypertension since about 2004. Gastrointestinal:  No history of stomach disease.  No history of liver disease.  No history of gall bladder disease.  No history of pancreas disease.  Negative colonoscopy by Dr. Henrene Pastor 2014. Urologic:  No history of kidney stones.  No history of bladder infections. GYN:  Hysterectomy 2002.  Sees Dr. Nori Riis from a Gyn standpoint.  SOCIAL and FAMILY HISTORY: Married. 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 Has 2 daughters - ages 91 and 35. Her mother had lung cancer and needed a porta cath. Her husband had hernia surgery by Dr. Prince Solian. [She saw Dr. Shaaron Adler getting treatment at [facility] - 10/2013]  PHYSICAL EXAM: BP 126/72  Pulse 72  Temp(Src) 98 F (36.7 C)  Resp 18  Ht 5' (1.524 m)  Wt 114 lb (51.71 kg)  BMI 22.26 kg/m2  General: WN WF who is alert and generally healthy appearing.  HEENT:  Pupils equal.  Dentition good. NECK:  Supple.  No thyroid mass. LYMPH NODES:  No cervical, supraclavicular, or axillary adenopathy. BREASTS -  RIGHT:  Port upper inner quad.  No mass   LEFT:  Incision at 12 o'clock looks okay.  Left axillary incision okay.  I don't see much redness today.  She has no findings of cellulitis.  Would observe for now. UPPER EXTREMITIES:  No evidence of lymphedema.  He skin is slightly tanned.  DATA REVIEWED: Path report to patient.  Alphonsa Overall, MD,  [facility], [address]  [place], [place]    [place] Phone:  [phone] FAX:  [phone]

## 2013-11-17 ENCOUNTER — Ambulatory Visit
Admission: RE | Admit: 2013-11-17 | Discharge: 2013-11-17 | Disposition: A | Discharge status: Home / Self Care | Payer: Medicare Other | Source: Ambulatory Visit | Attending: Radiation Oncology | Admitting: Radiation Oncology

## 2013-11-17 NOTE — Addendum Note (Signed)
Encounter addended by: Arlyss Repress, RN on: 11/17/2013  9:36 AM<BR>     Documentation filed: Notes Section

## 2013-11-18 ENCOUNTER — Ambulatory Visit
Admission: RE | Admit: 2013-11-18 | Discharge: 2013-11-18 | Disposition: A | Discharge status: Home / Self Care | Payer: Medicare Other | Source: Ambulatory Visit | Attending: Radiation Oncology | Admitting: Radiation Oncology

## 2013-11-21 ENCOUNTER — Ambulatory Visit
Admission: RE | Admit: 2013-11-21 | Discharge: 2013-11-21 | Disposition: A | Discharge status: Home / Self Care | Payer: Medicare Other | Source: Ambulatory Visit | Attending: Radiation Oncology | Admitting: Radiation Oncology

## 2013-11-22 ENCOUNTER — Ambulatory Visit
Admission: RE | Admit: 2013-11-22 | Discharge: 2013-11-22 | Disposition: A | Discharge status: Home / Self Care | Payer: Medicare Other | Source: Ambulatory Visit | Attending: Radiation Oncology | Admitting: Radiation Oncology

## 2013-11-22 ENCOUNTER — Ambulatory Visit: Admission: RE | Admit: 2013-11-22 | Payer: Medicare Other | Source: Ambulatory Visit | Admitting: Radiation Oncology

## 2013-11-22 VITALS — BP 122/63 | HR 89 | Temp 98.5°F | Wt 114.2 lb

## 2013-11-22 DIAGNOSIS — C50119 Malignant neoplasm of central portion of unspecified female breast: Secondary | ICD-10-CM

## 2013-11-22 NOTE — Progress Notes (Signed)
[place]     Rexene Edison, M.D. [place], [place] [address]               Blair Promise, M.D., Ph.D. Phone: [phone]      [place] A. Tammi Klippel, M.D. Fax: [phone]      Jodelle Gross, M.D., Ph.D.         Thea Silversmith, M.D.         Wyvonnia Lora, M.D Weekly Treatment Management Note  Name: Ellen Beltran     MRN: [id number]        CSN: [id number] Date: 11/22/2013      DOB: [birth year]  CC: Woody Seller, MD         [place]    Status: Outpatient  Diagnosis: The encounter diagnosis was Cancer of central portion of female breast, Left.  T2, N0..  Current Dose: 12.6 Gy  Current Fraction: 7  Planned Dose: 45 Gy  Narrative: Shanon Rosser was seen today for weekly treatment management. The chart was checked and port films  were reviewed. She is tolerating her treatments well at this time without any significant fatigue itching or discomfort within the breast.  Codeine; Morphine and related; and Sulfa antibiotics Current Outpatient Prescriptions  Medication Sig Dispense Refill  . Benfotiamine 150 MG CAPS Take 150 mg by mouth daily.       . Biotin 5000 MCG CAPS Take by mouth.      . gabapentin (NEURONTIN) 100 MG capsule Take 2 capsules three times a day  180 capsule  6  . Multiple Vitamins-Minerals (ALIVE WOMENS 50+ PO) Take 2 each by mouth daily. Take 2 gummies per day      . simvastatin (ZOCOR) 40 MG tablet Take 40 mg by mouth every evening.       No current facility-administered medications for this encounter.   Labs:  Lab Results  Component Value Date   WBC 8.9 11/04/2013   HGB 11.1* 11/04/2013   HCT 34.3* 11/04/2013   MCV 90.3 11/04/2013   PLT 380 11/04/2013   Lab Results  Component Value Date   CREATININE 0.7 11/04/2013   BUN 15.0 11/04/2013   NA 141 11/04/2013   K 4.0 11/04/2013   CL 102 10/13/2013   CO2 26 11/04/2013   Lab Results  Component Value Date   ALT 20 11/04/2013   AST 20 11/04/2013   PHOS 3.7 09/23/2013   BILITOT 0.22 11/04/2013    Physical Examination:  Filed Vitals:   11/22/13 0839  BP: 122/63  Pulse: 89  Temp: 98.5 F (36.9 C)    Wt Readings from Last 3 Encounters:  11/22/13 114 lb 3.2 oz (51.801 kg)  11/16/13 114 lb (51.71 kg)  11/15/13 113 lb (51.256 kg)    Mild hyperpigmentation changes noted in the left breast. Lungs - Normal respiratory effort, chest expands symmetrically. Lungs are clear to auscultation, no crackles or wheezes.  Heart has regular rhythm and rate  Abdomen is soft and non tender with normal bowel sounds  Assessment:  Patient tolerating treatments well  Plan: Continue treatment per original radiation prescription

## 2013-11-22 NOTE — Progress Notes (Signed)
Patient here for weekly assessment of radiation to left breast.Completed 7 of 25 treatments.No skin changes.Denies pain except for occasional shooting pain.Has hot flashes.

## 2013-11-23 ENCOUNTER — Ambulatory Visit
Admission: RE | Admit: 2013-11-23 | Discharge: 2013-11-23 | Disposition: A | Discharge status: Home / Self Care | Payer: Medicare Other | Source: Ambulatory Visit | Attending: Radiation Oncology | Admitting: Radiation Oncology

## 2013-11-24 ENCOUNTER — Ambulatory Visit
Admission: RE | Admit: 2013-11-24 | Discharge: 2013-11-24 | Disposition: A | Discharge status: Home / Self Care | Payer: Medicare Other | Source: Ambulatory Visit | Attending: Radiation Oncology | Admitting: Radiation Oncology

## 2013-11-25 ENCOUNTER — Ambulatory Visit
Admission: RE | Admit: 2013-11-25 | Discharge: 2013-11-25 | Disposition: A | Discharge status: Home / Self Care | Payer: Medicare Other | Source: Ambulatory Visit | Attending: Radiation Oncology | Admitting: Radiation Oncology

## 2013-11-28 ENCOUNTER — Ambulatory Visit
Admission: RE | Admit: 2013-11-28 | Discharge: 2013-11-28 | Disposition: A | Discharge status: Home / Self Care | Payer: Medicare Other | Source: Ambulatory Visit | Attending: Radiation Oncology | Admitting: Radiation Oncology

## 2013-11-29 ENCOUNTER — Ambulatory Visit
Admission: RE | Admit: 2013-11-29 | Discharge: 2013-11-29 | Disposition: A | Discharge status: Home / Self Care | Payer: Medicare Other | Source: Ambulatory Visit | Attending: Radiation Oncology | Admitting: Radiation Oncology

## 2013-11-29 ENCOUNTER — Encounter: Payer: Self-pay | Admitting: Radiation Oncology

## 2013-11-29 VITALS — BP 134/88 | HR 85 | Temp 97.9°F | Resp 16 | Wt 113.0 lb

## 2013-11-29 DIAGNOSIS — C50119 Malignant neoplasm of central portion of unspecified female breast: Secondary | ICD-10-CM

## 2013-11-29 NOTE — Progress Notes (Signed)
Weekly Management Note Current Dose: 21.6  Gy  Projected Dose: 61 Gy   Narrative:  The patient presents for routine under treatment assessment.  CBCT/MVCT images/Port film x-rays were reviewed.  The chart was checked. Doing well. No complaints from RT. C/O weight gain and neuropathy.    Physical Findings: Weight: 113 lb (51.256 kg). Minimal skin redness.   Impression:  The patient is tolerating radiation.  Plan:  Continue treatment as planned. Continue radiaplex.

## 2013-11-29 NOTE — Progress Notes (Signed)
Weekly  Rad txs, lt breast 12 completed, slight erythema,skin intact, radiaplex bid, c/o neuropathy hands/feet/numbing under left axilla, energy level good, appetite good Staying  hydrated  8:50 AM

## 2013-11-30 ENCOUNTER — Ambulatory Visit
Admission: RE | Admit: 2013-11-30 | Discharge: 2013-11-30 | Disposition: A | Discharge status: Home / Self Care | Payer: Medicare Other | Source: Ambulatory Visit | Attending: Radiation Oncology | Admitting: Radiation Oncology

## 2013-12-01 ENCOUNTER — Ambulatory Visit
Admission: RE | Admit: 2013-12-01 | Discharge: 2013-12-01 | Disposition: A | Discharge status: Home / Self Care | Payer: Medicare Other | Source: Ambulatory Visit | Attending: Radiation Oncology | Admitting: Radiation Oncology

## 2013-12-02 ENCOUNTER — Ambulatory Visit
Admission: RE | Admit: 2013-12-02 | Discharge: 2013-12-02 | Disposition: A | Discharge status: Home / Self Care | Payer: Medicare Other | Source: Ambulatory Visit | Attending: Radiation Oncology | Admitting: Radiation Oncology

## 2013-12-05 ENCOUNTER — Ambulatory Visit
Admission: RE | Admit: 2013-12-05 | Discharge: 2013-12-05 | Disposition: A | Discharge status: Home / Self Care | Payer: Medicare Other | Source: Ambulatory Visit | Attending: Radiation Oncology | Admitting: Radiation Oncology

## 2013-12-06 ENCOUNTER — Ambulatory Visit
Admission: RE | Admit: 2013-12-06 | Discharge: 2013-12-06 | Disposition: A | Discharge status: Home / Self Care | Payer: Medicare Other | Source: Ambulatory Visit | Attending: Radiation Oncology | Admitting: Radiation Oncology

## 2013-12-06 VITALS — BP 133/69 | HR 89 | Temp 98.0°F | Wt 114.4 lb

## 2013-12-06 DIAGNOSIS — C50119 Malignant neoplasm of central portion of unspecified female breast: Secondary | ICD-10-CM

## 2013-12-06 MED ORDER — RADIAPLEXRX EX GEL
Freq: Once | CUTANEOUS | Status: AC
  Administered 2013-12-06: 09:00:00 via TOPICAL

## 2013-12-06 NOTE — Progress Notes (Signed)
Patient for weekly assessment of radiation to left breast.Has some tenderness and mild discoloration with follicular rash mid chest.Mild fatigue.Gave another tube of radiaplex to continue application 2 to 3 times daily.

## 2013-12-06 NOTE — Progress Notes (Signed)
Weekly Management Note Current Dose: 30.6 Gy  Projected Dose: 61 Gy   Narrative:  The patient presents for routine under treatment assessment.  CBCT/MVCT images/Port film x-rays were reviewed.  The chart was checked. Still concerned about weight gain. Mild skin irritation.   Physical Findings: Weight: 114 lb 6.4 oz (51.891 kg). Mild skin redness. Mild dermatitis medially.   Impression:  The patient is tolerating radiation.  Plan:  Continue treatment as planned. Continue radiaplex.

## 2013-12-07 ENCOUNTER — Ambulatory Visit
Admission: RE | Admit: 2013-12-07 | Discharge: 2013-12-07 | Disposition: A | Discharge status: Home / Self Care | Payer: Medicare Other | Source: Ambulatory Visit | Attending: Radiation Oncology | Admitting: Radiation Oncology

## 2013-12-08 ENCOUNTER — Ambulatory Visit
Admission: RE | Admit: 2013-12-08 | Discharge: 2013-12-08 | Disposition: A | Discharge status: Home / Self Care | Payer: Medicare Other | Source: Ambulatory Visit | Attending: Radiation Oncology | Admitting: Radiation Oncology

## 2013-12-09 ENCOUNTER — Ambulatory Visit
Admission: RE | Admit: 2013-12-09 | Discharge: 2013-12-09 | Disposition: A | Discharge status: Home / Self Care | Payer: Medicare Other | Source: Ambulatory Visit | Attending: Radiation Oncology | Admitting: Radiation Oncology

## 2013-12-12 ENCOUNTER — Ambulatory Visit
Admission: RE | Admit: 2013-12-12 | Discharge: 2013-12-12 | Disposition: A | Discharge status: Home / Self Care | Payer: Medicare Other | Source: Ambulatory Visit | Attending: Radiation Oncology | Admitting: Radiation Oncology

## 2013-12-13 ENCOUNTER — Ambulatory Visit
Admission: RE | Admit: 2013-12-13 | Discharge: 2013-12-13 | Disposition: A | Discharge status: Home / Self Care | Payer: Medicare Other | Source: Ambulatory Visit | Attending: Radiation Oncology | Admitting: Radiation Oncology

## 2013-12-13 ENCOUNTER — Ambulatory Visit: Payer: Medicare Other | Admitting: Radiation Oncology

## 2013-12-13 VITALS — BP 122/67 | HR 82 | Temp 98.1°F | Wt 113.9 lb

## 2013-12-13 DIAGNOSIS — C50119 Malignant neoplasm of central portion of unspecified female breast: Secondary | ICD-10-CM

## 2013-12-13 NOTE — Progress Notes (Signed)
Weekly assessment of radiation to left [email or web address] 22 of 25 treatments.Mild discoloration with follicular rash.Mild itching relieved with application of radiaplex.Mild fatigue relieved with rest.

## 2013-12-13 NOTE — Progress Notes (Signed)
Weekly Management Note Current Dose: 39.6  Gy  Projected Dose: 61 Gy   Narrative:  The patient presents for routine under treatment assessment.  CBCT/MVCT images/Port film x-rays were reviewed.  The chart was checked. Doing well. No complaints. Resim yesterday for electron boost.   Physical Findings: Weight: 113 lb 14.4 oz (51.665 kg). Pink, dark skin over breast.   Impression:  The patient is tolerating radiation.  Plan:  Continue treatment as planned.Continue radiaplex.

## 2013-12-14 ENCOUNTER — Ambulatory Visit
Admission: RE | Admit: 2013-12-14 | Discharge: 2013-12-14 | Disposition: A | Discharge status: Home / Self Care | Payer: Medicare Other | Source: Ambulatory Visit | Attending: Radiation Oncology | Admitting: Radiation Oncology

## 2013-12-15 ENCOUNTER — Ambulatory Visit
Admission: RE | Admit: 2013-12-15 | Discharge: 2013-12-15 | Disposition: A | Discharge status: Home / Self Care | Payer: Medicare Other | Source: Ambulatory Visit | Attending: Radiation Oncology | Admitting: Radiation Oncology

## 2013-12-15 ENCOUNTER — Ambulatory Visit: Admission: RE | Admit: 2013-12-15 | Payer: Medicare Other | Source: Ambulatory Visit | Admitting: Radiation Oncology

## 2013-12-16 ENCOUNTER — Ambulatory Visit: Payer: Medicare Other | Admitting: Radiation Oncology

## 2013-12-16 ENCOUNTER — Ambulatory Visit: Payer: Medicare Other

## 2013-12-19 ENCOUNTER — Ambulatory Visit: Payer: Medicare Other | Admitting: Radiation Oncology

## 2013-12-19 ENCOUNTER — Ambulatory Visit: Payer: Medicare Other

## 2013-12-20 ENCOUNTER — Ambulatory Visit
Admission: RE | Admit: 2013-12-20 | Discharge: 2013-12-20 | Disposition: A | Discharge status: Home / Self Care | Payer: Medicare Other | Source: Ambulatory Visit | Attending: Radiation Oncology | Admitting: Radiation Oncology

## 2013-12-20 ENCOUNTER — Telehealth: Payer: Self-pay | Admitting: Oncology

## 2013-12-20 ENCOUNTER — Ambulatory Visit: Payer: Medicare Other

## 2013-12-20 DIAGNOSIS — C50119 Malignant neoplasm of central portion of unspecified female breast: Secondary | ICD-10-CM

## 2013-12-20 NOTE — Progress Notes (Signed)
Weekly Management Note Current Dose:  47 Gy  Projected Dose: 61 Gy   Narrative:  The patient presents for routine under treatment assessment.  CBCT/MVCT images/Port film x-rays were reviewed.  The chart was checked. Doing well. Some soreness over left breast. Would like appt with Humphrey Rolls made to discuss removal of porta cath.   Physical Findings: Weight:  . Unchanged  Impression:  The patient is tolerating radiation.  Plan:  Continue treatment as planned. Continue RT. Has appt on 4/15 with Lisabeth Register PA

## 2013-12-20 NOTE — Telephone Encounter (Signed)
, °

## 2013-12-21 ENCOUNTER — Ambulatory Visit
Admission: RE | Admit: 2013-12-21 | Discharge: 2013-12-21 | Disposition: A | Discharge status: Home / Self Care | Payer: Medicare Other | Source: Ambulatory Visit | Attending: Radiation Oncology | Admitting: Radiation Oncology

## 2013-12-22 ENCOUNTER — Ambulatory Visit
Admission: RE | Admit: 2013-12-22 | Discharge: 2013-12-22 | Disposition: A | Discharge status: Home / Self Care | Payer: Medicare Other | Source: Ambulatory Visit | Attending: Radiation Oncology | Admitting: Radiation Oncology

## 2013-12-23 ENCOUNTER — Ambulatory Visit: Admission: RE | Admit: 2013-12-23 | Payer: Medicare Other | Source: Ambulatory Visit

## 2013-12-26 ENCOUNTER — Ambulatory Visit
Admission: RE | Admit: 2013-12-26 | Discharge: 2013-12-26 | Disposition: A | Discharge status: Home / Self Care | Payer: Medicare Other | Source: Ambulatory Visit | Attending: Radiation Oncology | Admitting: Radiation Oncology

## 2013-12-27 ENCOUNTER — Ambulatory Visit: Admission: RE | Admit: 2013-12-27 | Payer: Medicare Other | Source: Ambulatory Visit | Admitting: Radiation Oncology

## 2013-12-27 ENCOUNTER — Ambulatory Visit
Admission: RE | Admit: 2013-12-27 | Discharge: 2013-12-27 | Disposition: A | Discharge status: Home / Self Care | Payer: Medicare Other | Source: Ambulatory Visit | Attending: Radiation Oncology | Admitting: Radiation Oncology

## 2013-12-28 ENCOUNTER — Ambulatory Visit: Payer: Medicare Other

## 2013-12-28 ENCOUNTER — Telehealth: Payer: Self-pay | Admitting: Oncology

## 2013-12-28 ENCOUNTER — Ambulatory Visit (HOSPITAL_BASED_OUTPATIENT_CLINIC_OR_DEPARTMENT_OTHER): Payer: Medicare Other | Admitting: Adult Health

## 2013-12-28 ENCOUNTER — Ambulatory Visit
Admission: RE | Admit: 2013-12-28 | Discharge: 2013-12-28 | Disposition: A | Discharge status: Home / Self Care | Payer: Medicare Other | Source: Ambulatory Visit | Attending: Radiation Oncology | Admitting: Radiation Oncology

## 2013-12-28 VITALS — BP 139/74 | HR 64 | Temp 98.1°F | Resp 18 | Ht 60.0 in | Wt 112.7 lb

## 2013-12-28 DIAGNOSIS — G5690 Unspecified mononeuropathy of unspecified upper limb: Secondary | ICD-10-CM

## 2013-12-28 DIAGNOSIS — C50919 Malignant neoplasm of unspecified site of unspecified female breast: Secondary | ICD-10-CM

## 2013-12-28 DIAGNOSIS — Z170 Estrogen receptor positive status [ER+]: Secondary | ICD-10-CM

## 2013-12-28 DIAGNOSIS — C50119 Malignant neoplasm of central portion of unspecified female breast: Secondary | ICD-10-CM

## 2013-12-28 NOTE — Progress Notes (Signed)
Hematology and Oncology Follow Up Visit  Ellen Beltran [id number] 04-Oct-1945 68 y.o. 12/30/2013 6:20 AM     Principle Diagnosis:Ellen Beltran 68 y.o. female with ER positive, stage IIA invasive ductal carcinoma of the left breast.     Prior Therapy: 1. annual screening mammogram in 03/2013 and mammography noted a mass at the 12 o'clock position. Diagnostic mammogram and ultrasound of the left breast was performed on 03/30/2013. The ultrasound showed a 1.8 cm mass in the 12 o'clock position of the left breast. Bilateral breast MRI performed on 04/25/2013 showed a 2.4 cm area of concern in the 12 o'clock position of the left breast. There were no abnormal appearing axillary subpectoral or internal mammary lymph nodes. Left breast needle core biopsy performed on 04/15/2013 revealed invasive carcinoma with papillary features consistent with a ductal phenotype. Tumor grade was intermediate to high grade, estrogen receptor negative, progesterone receptor negative, with a proliferation marker Ki-67 of 47%, HER-2/neu by CISH showed no amplification.   2. 2-D echocardiogram performed on 05/06/2013 showed a left ventricular ejection fraction of 60%.   3. S/p Neoadjuvant chemotherapy consisting of AC (Adriamycin/Cytoxan) began on 05/13/2013 through 06/24/2013 x 4 cycles with Neulasta injection for granulocyte support administered on day 2 of each cycle.   4. S/p Neoadjuvant chemotherapy consisting of Taxol/Carboplatin started on 07/08/2013 - 09/02/13 completed 9/12 planned. Discontiued to due neuropathy/rashes.   5. S/p left breast lumpectomy with SLN performed on 10/17/13 with the pathology revealing:  1. Breast, lumpectomy, Left  - FOCAL INVASIVE DUCTAL CARCINOMA ASSOCIATED WITH FIBROSIS AND INFLAMMATION. 0.4 cm residual disease  - MARGINS NOT INVOLVED.  2. Lymph node, sentinel, biopsy, Left Axillary #1  - ONE BENIGN LYMPH NODE (0/1).  Prognostic markers:  ER+, PR-, Her2Neu- (prior tumor triple  negative)  6. Adjuvant radiation started on 11/14/2013  Current therapy:  Radiation  Interim History: Ellen Beltran 68 y.o. female with stage IIA invasive ductal carcinoma.  She is here for f/u.  She was recommended an appointment to get her port flushed.  She would like her port removed.  She is currently undergoing adjuvant radiation therapy and she is tolerating it moderately well.  She does have some redness on her left breast from radiation.  She continues to have neuropathy in her fingertips and on her right inner wrist that she describes as a pins and needles sensation.  She is otherwise doing well.  We updated her health maintenance information below.   Medications:  Current Outpatient Prescriptions  Medication Sig Dispense Refill  . Benfotiamine 150 MG CAPS Take 150 mg by mouth daily.       . Biotin 5000 MCG CAPS Take by mouth.      . gabapentin (NEURONTIN) 100 MG capsule Take 2 capsules three times a day  180 capsule  6  . hyaluronate sodium (RADIAPLEXRX) GEL Apply 1 application topically 2 (two) times daily.      . Multiple Vitamins-Minerals (ALIVE WOMENS 50+ PO) Take 2 each by mouth daily. Take 2 gummies per day      . simvastatin (ZOCOR) 40 MG tablet Take 40 mg by mouth every evening.       No current facility-administered medications for this visit.     Allergies:  Allergies  Allergen Reactions  . Codeine Other (See Comments)    HALLUCINATIONS  . Morphine And Related Nausea Only  . Sulfa Antibiotics Rash    Medical History: Past Medical History  Diagnosis Date  . Hyperlipidemia   .  Osteopenia   . Arthritis   . Cancer     left breast  . Neuropathy due to chemotherapeutic drug     hands and feet  . History of blood transfusion 09/24/2013  . Dental bridge present     left lower  . History of chemotherapy     stopped 08/2013    Surgical History:  Past Surgical History  Procedure Laterality Date  . Portacath placement N/A 05/02/2013    Procedure: INSERTION  PORT-A-CATH;  Surgeon: Shann Medal, MD;  Location: [facility];  Service: General;  Laterality: N/A;  . Total abdominal hysterectomy w/ bilateral salpingoophorectomy  05/06/2001  . Pelvic floor repair  05/06/2001    cardinal uterosacral colposuspension  . Cardiac catheterization  08/31/2003    no disease  . Colonoscopy with propofol  06/22/2012  . Tubal ligation  age 52  . Breast lumpectomy with needle localization and axillary sentinel lymph node bx Left 10/17/2013    Procedure: BREAST LUMPECTOMY WITH NEEDLE LOCALIZATION AND AXILLARY SENTINEL LYMPH NODE BX;  Surgeon: Shann Medal, MD;  Location: [facility];  Service: General;  Laterality: Left;     Review of Systems: A 10 point review of systems was conducted and is otherwise negative except for what is noted above.    Health Maintenance  Mammogram:  03/30/2013 Colonoscopy: 06/22/2012 repeat recommended in 10 years Bone Density Scan:  unknown Pap Smear: 03/2013 Eye Exam: 6-7 years ago Vitamin D Level: unknown Lipid Panel: 12/02/2013   Physical Exam: Blood pressure 139/74, pulse 64, temperature 98.1 F (36.7 C), temperature source Oral, resp. rate 18, height 5' (1.524 m), weight 112 lb 11.2 oz (51.12 kg). GENERAL: Patient is a well appearing female in no acute distress HEENT:  Sclerae anicteric.  Oropharynx clear and moist. No ulcerations or evidence of oropharyngeal candidiasis. Neck is supple.  NODES:  No cervical, supraclavicular, or axillary lymphadenopathy palpated.  BREAST EXAM:  Right breast, no masses, nodules skin changes, left breast with erythema and small amount of peeling LUNGS:  Clear to auscultation bilaterally.  No wheezes or rhonchi. HEART:  Regular rate and rhythm. No murmur appreciated. ABDOMEN:  Soft, nontender.  Positive, normoactive bowel sounds. No organomegaly palpated. MSK:  No focal spinal tenderness to palpation. Full range of motion bilaterally in the upper extremities. EXTREMITIES:  No  peripheral edema.   SKIN:  Clear with no obvious rashes or skin changes. No nail dyscrasia.  Port in right chest wall, intact, no erythema NEURO:  Nonfocal. Well oriented.  Appropriate affect. ECOG PERFORMANCE STATUS: 1 - Symptomatic but completely ambulatory   Lab Results: Lab Results  Component Value Date   WBC 6.0 12/29/2013   HGB 11.6 12/29/2013   HCT 35.7 12/29/2013   MCV 90.4 12/29/2013   PLT 265 12/29/2013     Chemistry      Component Value Date/Time   NA 143 12/29/2013 0942   NA 145 10/13/2013 0900   K 3.9 12/29/2013 0942   K 4.4 10/13/2013 0900   CL 102 10/13/2013 0900   CO2 26 12/29/2013 0942   CO2 29 10/13/2013 0900   BUN 11.3 12/29/2013 0942   BUN 12 10/13/2013 0900   CREATININE 0.7 12/29/2013 0942   CREATININE 0.62 10/13/2013 0900      Component Value Date/Time   CALCIUM 10.1 12/29/2013 0942   CALCIUM 9.9 10/13/2013 0900   ALKPHOS 69 12/29/2013 0942   ALKPHOS 61 09/24/2013 0548   AST 24 12/29/2013 0942   AST 57*  09/24/2013 0548   ALT 21 12/29/2013 0942   ALT 100* 09/24/2013 0548   BILITOT 0.26 12/29/2013 0942   BILITOT 0.2* 09/24/2013 0548      Assessment and Plan: Ellen Beltran 68 y.o. female with   1. Stage IIA invasive ductal carcinoma of the left breast.  The patient is s/p neoadjuvant chemotherapy, followed by lumpectomy, and is currently receiving radiation.  Pathology from surgery on 10/17/13 revealed that her tumor was estrogen receptor positive of 52%.  This makes her a candidate for anti-estrogen therapy.  We discussed aromatase inhibitors and Tamoxifen today.  She does not have h/o CVA, blood clots, abnormal pap smears, cataracts.    2. Neuropathy:  The patient continues to have neuropathy, that sounds significant.  Due to this I recommended she increase her Gabapentin to 326m TID.  She expressed frustration with this and stated she wanted to stop the medication.  I informed her the choice was hers, and that if she does stop, she needs to do so slowly.    3.  Survivorship:  The patient does need an eye exam and bone density done particularly if we are going to start her on anti-estrogen therapy.  The patient is planning on checking with [facility] to see when her last bone density was because she remembers being told that she has a very low bone density.  I informed her that if her bone density was greater than 2 years ago, then I will order another one.  [facility]    4.  Port:  The patient has almost completed radiation therapy.  She can have her port removed.  We will flush her port tomorrow or Friday after her radiation appointment.    The patient will return in one month for follow up regarding starting anti-estrogen therapy.   She knows to call [facility] the interim for any questions or concerns.  We can certainly see her sooner if needed.   I spent 25 minutes counseling the patient face to face.  The total time spent in the appointment was 30 minutes.  LMinette Headland [phone]/17/2015 6:20 AM

## 2013-12-28 NOTE — Patient Instructions (Signed)
Exemestane tablets What is this medicine? EXEMESTANE (ex e MES tane) blocks the production of the hormone estrogen. Some types of breast cancer depend on estrogen to grow, and this medicine can stop tumor growth by blocking estrogen production. This medicine is for the treatment of breast cancer in postmenopausal women only. This medicine may be used for other purposes; ask your health care provider or pharmacist if you have questions. COMMON BRAND NAME(S): Aromasin What should I tell my health care provider before I take this medicine? They need to know if you have any of these conditions: -an unusual or allergic reaction to exemestane, other medicines, foods, dyes, or preservatives -pregnant or trying to get pregnant -breast-feeding How should I use this medicine? Take this medicine by mouth with a glass of water. Follow the directions on the prescription label. Take your doses at regular intervals after a meal. Do not take your medicine more often than directed. Do not stop taking except on the advice of your doctor or health care professional. Contact your pediatrician regarding the use of this medicine in children. Special care may be needed. Overdosage: If you think you have taken too much of this medicine contact a poison control center or emergency room at once. NOTE: This medicine is only for you. Do not share this medicine with others. What if I miss a dose? If you miss a dose, take the next dose as usual. Do not try to make up the missed dose. Do not take double or extra doses. What may interact with this medicine? Do not take this medicine with any of the following medications: -female hormones, like estrogens and birth control pills This medicine may also interact with the following medications: -androstenedione -phenytoin -rifabutin, rifampin, or rifapentine -St. John's Wort This list may not describe all possible interactions. Give your health care provider a list of all the  medicines, herbs, non-prescription drugs, or dietary supplements you use. Also tell them if you smoke, drink alcohol, or use illegal drugs. Some items may interact with your medicine. What should I watch for while using this medicine? Visit your doctor or health care professional for regular checks on your progress. If you experience hot flashes or sweating while taking this medicine, avoid alcohol, smoking and drinks with caffeine. This may help to decrease these side effects. What side effects may I notice from receiving this medicine? Side effects that you should report to your doctor or health care professional as soon as possible: -any new or unusual symptoms -changes in vision -fever -leg or arm swelling -pain in bones, joints, or muscles -pain in hips, back, ribs, arms, shoulders, or legs Side effects that usually do not require medical attention (report to your doctor or health care professional if they continue or are bothersome): -difficulty sleeping -headache -hot flashes -sweating -unusually weak or tired This list may not describe all possible side effects. Call your doctor for medical advice about side effects. You may report side effects to FDA at 1-800-FDA-1088. Where should I keep my medicine? Keep out of the reach of children. Store at room temperature between 15 and 30 degrees C (59 and 86 degrees F). Throw away any unused medicine after the expiration date. NOTE: This sheet is a summary. It may not cover all possible information. If you have questions about this medicine, talk to your doctor, pharmacist, or health care provider.  2014, Elsevier/Gold Standard. (2008-01-04 11:48:29) Tamoxifen oral tablet What is this medicine? TAMOXIFEN (ta MOX i fen) blocks the effects  of estrogen. It is commonly used to treat breast cancer. It is also used to decrease the chance of breast cancer coming back in women who have received treatment for the disease. It may also help prevent  breast cancer in women who have a high risk of developing breast cancer. This medicine may be used for other purposes; ask your health care provider or pharmacist if you have questions. COMMON BRAND NAME(S): Nolvadex What should I tell my health care provider before I take this medicine? They need to know if you have any of these conditions: -blood clots -blood disease -cataracts or impaired eyesight -endometriosis -high calcium levels -high cholesterol -irregular menstrual cycles -liver disease -stroke -uterine fibroids -an unusual or allergic reaction to tamoxifen, other medicines, foods, dyes, or preservatives -pregnant or trying to get pregnant -breast-feeding How should I use this medicine? Take this medicine by mouth with a glass of water. Follow the directions on the prescription label. You can take it with or without food. Take your medicine at regular intervals. Do not take your medicine more often than directed. Do not stop taking except on your doctor's advice. A special MedGuide will be given to you by the pharmacist with each prescription and refill. Be sure to read this information carefully each time. Talk to your pediatrician regarding the use of this medicine in children. While this drug may be prescribed for selected conditions, precautions do apply. Overdosage: If you think you have taken too much of this medicine contact a poison control center or emergency room at once. NOTE: This medicine is only for you. Do not share this medicine with others. What if I miss a dose? If you miss a dose, take it as soon as you can. If it is almost time for your next dose, take only that dose. Do not take double or extra doses. What may interact with this medicine? -aminoglutethimide -bromocriptine -chemotherapy drugs -female hormones, like estrogens and birth control pills -letrozole -medroxyprogesterone -phenobarbital -rifampin -warfarin This list may not describe all  possible interactions. Give your health care provider a list of all the medicines, herbs, non-prescription drugs, or dietary supplements you use. Also tell them if you smoke, drink alcohol, or use illegal drugs. Some items may interact with your medicine. What should I watch for while using this medicine? Visit your doctor or health care professional for regular checks on your progress. You will need regular pelvic exams, breast exams, and mammograms. If you are taking this medicine to reduce your risk of getting breast cancer, you should know that this medicine does not prevent all types of breast cancer. If breast cancer or other problems occur, there is no guarantee that it will be found at an early stage. Do not become pregnant while taking this medicine or for 2 months after stopping this medicine. Stop taking this medicine if you get pregnant or think you are pregnant and contact your doctor. This medicine may harm your unborn baby. Women who can possibly become pregnant should use birth control methods that do not use hormones during tamoxifen treatment and for 2 months after therapy has stopped. Talk with your health care provider for birth control advice. Do not breast feed while taking this medicine. What side effects may I notice from receiving this medicine? Side effects that you should report to your doctor or health care professional as soon as possible: -changes in vision (blurred vision) -changes in your menstrual cycle -difficulty breathing or shortness of breath -difficulty walking or talking -  new breast lumps -numbness -pelvic pain or pressure -redness, blistering, peeling or loosening of the skin, including inside the mouth -skin rash or itching (hives) -sudden chest pain -swelling of lips, face, or tongue -swelling, pain or tenderness in your calf or leg -unusual bruising or bleeding -vaginal discharge that is bloody, brown, or rust -weakness -yellowing of the whites of the  eyes or skin Side effects that usually do not require medical attention (report to your doctor or health care professional if they continue or are bothersome): -fatigue -hair loss, although uncommon and is usually mild -headache -hot flashes -impotence (in men) -nausea, vomiting (mild) -vaginal discharge (white or clear) This list may not describe all possible side effects. Call your doctor for medical advice about side effects. You may report side effects to FDA at 1-800-FDA-1088. Where should I keep my medicine? Keep out of the reach of children. Store at room temperature between 20 and 25 degrees C (68 and 77 degrees F). Protect from light. Keep container tightly closed. Throw away any unused medicine after the expiration date. NOTE: This sheet is a summary. It may not cover all possible information. If you have questions about this medicine, talk to your doctor, pharmacist, or health care provider.  2014, Elsevier/Gold Standard. (2008-05-18 12:01:56)

## 2013-12-28 NOTE — Telephone Encounter (Signed)
gv pt appt schedule for april/may °

## 2013-12-29 ENCOUNTER — Other Ambulatory Visit (HOSPITAL_BASED_OUTPATIENT_CLINIC_OR_DEPARTMENT_OTHER): Payer: Medicare Other

## 2013-12-29 ENCOUNTER — Ambulatory Visit: Payer: Medicare Other

## 2013-12-29 ENCOUNTER — Ambulatory Visit
Admission: RE | Admit: 2013-12-29 | Discharge: 2013-12-29 | Disposition: A | Discharge status: Home / Self Care | Payer: Medicare Other | Source: Ambulatory Visit | Attending: Radiation Oncology | Admitting: Radiation Oncology

## 2013-12-29 ENCOUNTER — Other Ambulatory Visit: Payer: Self-pay

## 2013-12-29 VITALS — BP 132/67 | HR 65 | Temp 97.9°F

## 2013-12-29 VITALS — BP 128/59 | HR 70 | Temp 97.9°F | Wt 112.7 lb

## 2013-12-29 DIAGNOSIS — C50119 Malignant neoplasm of central portion of unspecified female breast: Secondary | ICD-10-CM

## 2013-12-29 DIAGNOSIS — C50919 Malignant neoplasm of unspecified site of unspecified female breast: Secondary | ICD-10-CM

## 2013-12-29 DIAGNOSIS — Z95828 Presence of other vascular implants and grafts: Secondary | ICD-10-CM

## 2013-12-29 LAB — COMPREHENSIVE METABOLIC PANEL (CC13)
ALBUMIN: 4 g/dL (ref 3.5–5.0)
ALK PHOS: 69 U/L (ref 40–150)
ALT: 21 U/L (ref 0–55)
AST: 24 U/L (ref 5–34)
Anion Gap: 11 mEq/L (ref 3–11)
BILIRUBIN TOTAL: 0.26 mg/dL (ref 0.20–1.20)
BUN: 11.3 mg/dL (ref 7.0–26.0)
CO2: 26 mEq/L (ref 22–29)
Calcium: 10.1 mg/dL (ref 8.4–10.4)
Chloride: 107 mEq/L (ref 98–109)
Creatinine: 0.7 mg/dL (ref 0.6–1.1)
Glucose: 101 mg/dl (ref 70–140)
Potassium: 3.9 mEq/L (ref 3.5–5.1)
Sodium: 143 mEq/L (ref 136–145)
Total Protein: 7.2 g/dL (ref 6.4–8.3)

## 2013-12-29 LAB — CBC WITH DIFFERENTIAL/PLATELET
BASO%: 0.7 % (ref 0.0–2.0)
Basophils Absolute: 0 10*3/uL (ref 0.0–0.1)
EOS ABS: 0.2 10*3/uL (ref 0.0–0.5)
EOS%: 2.6 % (ref 0.0–7.0)
HCT: 35.7 % (ref 34.8–46.6)
HGB: 11.6 g/dL (ref 11.6–15.9)
LYMPH%: 21.5 % (ref 14.0–49.7)
MCH: 29.4 pg (ref 25.1–34.0)
MCHC: 32.5 g/dL (ref 31.5–36.0)
MCV: 90.4 fL (ref 79.5–101.0)
MONO#: 0.7 10*3/uL (ref 0.1–0.9)
MONO%: 11.8 % (ref 0.0–14.0)
NEUT%: 63.4 % (ref 38.4–76.8)
NEUTROS ABS: 3.8 10*3/uL (ref 1.5–6.5)
PLATELETS: 265 10*3/uL (ref 145–400)
RBC: 3.95 10*6/uL (ref 3.70–5.45)
RDW: 14.8 % — ABNORMAL HIGH (ref 11.2–14.5)
WBC: 6 10*3/uL (ref 3.9–10.3)
lymph#: 1.3 10*3/uL (ref 0.9–3.3)

## 2013-12-29 MED ORDER — HEPARIN SOD (PORK) LOCK FLUSH 100 UNIT/ML IV SOLN
500.0000 [IU] | Freq: Once | INTRAVENOUS | Status: AC
  Administered 2013-12-29: 500 [IU] via INTRAVENOUS
  Filled 2013-12-29: qty 5

## 2013-12-29 MED ORDER — SODIUM CHLORIDE 0.9 % IJ SOLN
10.0000 mL | INTRAMUSCULAR | Status: DC | PRN
  Administered 2013-12-29: 10 mL via INTRAVENOUS
  Filled 2013-12-29: qty 10

## 2013-12-29 NOTE — Progress Notes (Signed)
Weekly Management Note Current Dose: 59  Gy  Projected Dose: 61 Gy   Narrative:  The patient presents for routine under treatment assessment.  CBCT/MVCT images/Port film x-rays were reviewed.  The chart was checked. Doing well. Some redness in boost area but reast of breast is healing and turning brown.   Physical Findings: Weight: 112 lb 11.2 oz (51.12 kg). Dry skin. Pink over scar.  Impression:  The patient is tolerating radiation.  Plan:  Continue treatment as planned. Follow up in 1 month. Discussed lotion with vit e. Discussed fynn.

## 2013-12-30 ENCOUNTER — Ambulatory Visit
Admission: RE | Admit: 2013-12-30 | Discharge: 2013-12-30 | Disposition: A | Discharge status: Home / Self Care | Payer: Medicare Other | Source: Ambulatory Visit | Attending: Radiation Oncology | Admitting: Radiation Oncology

## 2013-12-30 ENCOUNTER — Encounter: Payer: Self-pay | Admitting: Adult Health

## 2013-12-30 ENCOUNTER — Encounter: Payer: Self-pay | Admitting: Radiation Oncology

## 2014-01-01 NOTE — Progress Notes (Signed)
  Radiation Oncology         [phone] ________________________________  Name: Ellen Beltran MRN: [id number]  Date: 12/30/2013  DOB: December 09, 1945  End of Treatment Note  Diagnosis:  Stage II Left Breast Cancer    Indication for treatment:  Curative       Radiation treatment dates:   11/14/2013-12/30/2013  Site/dose:   Left breast/ 45 Gy at 1.8 Gy per fraction x 25 fractions.  Left breast boost/ 16 Gy at 2 Gy per fraction x 8 fractions  Beams/energy:  Opposed tangents with reduced fields / 6 and 10 MV photons Enface electrons / 12 MeV electrons  Narrative: The patient tolerated radiation treatment relatively well.   She had minimal dry desquamation and no moist desquamation.  She had mild fatigue.   Plan: The patient has completed radiation treatment. The patient will return to radiation oncology clinic for routine followup in one month. I advised them to call or return sooner if they have any questions or concerns related to their recovery or treatment.  ------------------------------------------------  Thea Silversmith, MD

## 2014-01-16 ENCOUNTER — Telehealth (INDEPENDENT_AMBULATORY_CARE_PROVIDER_SITE_OTHER): Payer: Self-pay

## 2014-01-16 ENCOUNTER — Encounter (INDEPENDENT_AMBULATORY_CARE_PROVIDER_SITE_OTHER): Payer: Self-pay

## 2014-01-16 NOTE — Telephone Encounter (Signed)
Pts husband Ellen Beltran called stating Dr Humphrey Rolls and the NP indicated pt can have PAC removed. He states they were to send request to Dr Lucia Gaskins. I advised him no note in system but I will send this request to Dr Lucia Gaskins and his nurse to review chart. He will determine if pt needs appt prior to surgery or do orders. I advised pt's husband once orders are in epic the request will go to surgery scheduling to call pt and set up surgery. He states he understands.

## 2014-01-16 NOTE — Telephone Encounter (Signed)
Advised patient DR. Lucia Gaskins is DOW this week but I will make sure he aware patient is ready to have Power port removed per Charlestine Massed NP note , I do not think DR. Lucia Gaskins would need to see her since she was in on 11/16/13. We will be calling her soon with sx date/time . Patient verbalized understanding

## 2014-01-18 NOTE — Progress Notes (Signed)
Name: Ellen Beltran   MRN: [id number]  Date:  12/15/13  DOB: [birth year]  Status:outpatient    DIAGNOSIS: Breast cancer.  CONSENT VERIFIED: yes   SET UP: Patient is setup supine   IMMOBILIZATION:  The following immobilization was used:Custom Moldable Pillow, breast board.   NARRATIVE: Ellen Beltran underwent complex simulation and treatment planning for her boost treatment today.  Her tumor volume was outlined on the planning CT scan. The depth of her cavity was felt to be appropriate for treatment with electrons    12  MeV electrons will be prescribed to the 100% isodose line.   I personally oversaw and approved the construction of a unique block which will be used for beam modification purposes.  A special port plan is requested.

## 2014-01-26 ENCOUNTER — Telehealth: Payer: Self-pay | Admitting: Adult Health

## 2014-01-26 ENCOUNTER — Encounter: Payer: Self-pay | Admitting: Adult Health

## 2014-01-26 ENCOUNTER — Ambulatory Visit (HOSPITAL_BASED_OUTPATIENT_CLINIC_OR_DEPARTMENT_OTHER): Payer: Medicare Other | Admitting: Adult Health

## 2014-01-26 VITALS — BP 156/74 | HR 83 | Temp 98.3°F | Resp 18 | Ht 60.0 in | Wt 114.6 lb

## 2014-01-26 DIAGNOSIS — C50119 Malignant neoplasm of central portion of unspecified female breast: Secondary | ICD-10-CM

## 2014-01-26 DIAGNOSIS — E2839 Other primary ovarian failure: Secondary | ICD-10-CM

## 2014-01-26 DIAGNOSIS — C50919 Malignant neoplasm of unspecified site of unspecified female breast: Secondary | ICD-10-CM

## 2014-01-26 DIAGNOSIS — Z171 Estrogen receptor negative status [ER-]: Secondary | ICD-10-CM

## 2014-01-26 NOTE — Telephone Encounter (Signed)
per pof to sch BD and appt-sch-gave pt a copy of appt

## 2014-01-26 NOTE — Patient Instructions (Signed)

## 2014-01-26 NOTE — Progress Notes (Signed)
ID: Ellen Beltran OB: 07/06/1946  MR#: [id number]  CSN#:[id number]  PCP: Woody Seller, MD GYN:   SU: Dr. Lucia Gaskins OTHER MD:  CHIEF COMPLAINT:  Patient is a 68 y/o woman with left sided breast cancer here to start adjuvant anti-estrogen therapy  BREAST CANCER HISTORY:  Patient underwent annual screening mammogram in 03/2013 and mammography noted a mass at the 12 o'clock position. Diagnostic mammogram and ultrasound of the left breast was performed on 03/30/2013. The ultrasound showed a 1.8 cm mass in the 12 o'clock position of the left breast. Bilateral breast MRI performed on 04/25/2013 showed a 2.4 cm area of concern in the 12 o'clock position of the left breast. There were no abnormal appearing axillary subpectoral or internal mammary lymph nodes. Left breast needle core biopsy performed on 04/15/2013 revealed invasive carcinoma with papillary features consistent with a ductal phenotype. Tumor grade was intermediate to high grade, estrogen receptor negative, progesterone receptor negative, with a proliferation marker Ki-67 of 47%, HER-2/neu by CISH showed no amplification.    CURRENT THERAPY: Arimidex daily  INTERVAL HISTORY: Patient is doing well today.  She is here today following her radiation therapy.  She continues to have mild numbness in her fingertips and toes and is taking Gabapetin 394m three times a day.  Her numbness is stable.  She did have an eye exam and early cataracts were seen in her eyes bilaterally.  She denies fevers, chills, nausea, vomiting, constipation, diarrhea, skin changes, or any further concerns.  She does have some hyperpigmentation to the left breast.  She is otherwise feeling well and w/o questions or concerns.    REVIEW OF SYSTEMS: A 10 point review of systems was conducted and is otherwise negative except for what is noted above.     PAST MEDICAL HISTORY: Past Medical History  Diagnosis Date  . Hyperlipidemia   . Osteopenia   . Arthritis   . Cancer      left breast  . Neuropathy due to chemotherapeutic drug     hands and feet  . History of blood transfusion 09/24/2013  . Dental bridge present     left lower  . History of chemotherapy     stopped 08/2013    PAST SURGICAL HISTORY: Past Surgical History  Procedure Laterality Date  . Portacath placement N/A 05/02/2013    Procedure: INSERTION PORT-A-CATH;  Surgeon: DShann Medal MD;  Location: [facility];  Service: General;  Laterality: N/A;  . Total abdominal hysterectomy w/ bilateral salpingoophorectomy  05/06/2001  . Pelvic floor repair  05/06/2001    cardinal uterosacral colposuspension  . Cardiac catheterization  08/31/2003    no disease  . Colonoscopy with propofol  06/22/2012  . Tubal ligation  age 68 . Breast lumpectomy with needle localization and axillary sentinel lymph node bx Left 10/17/2013    Procedure: BREAST LUMPECTOMY WITH NEEDLE LOCALIZATION AND AXILLARY SENTINEL LYMPH NODE BX;  Surgeon: DShann Medal MD;  Location[facility]  Service: General;  Laterality: Left;    FAMILY HISTORY Family History  Problem Relation Age of Onset  . Heart disease Father   . Heart disease Brother   . Lung cancer Mother     GYNECOLOGIC HISTORY: Menarche at age 68 GG63 P2 was on Premarin, an estrogen replacement patch.  This was stopped when diagnosed with breast cancer.    SOCIAL HISTORY: [place]  Patient is retired.  No ETOH, tobacco, or illicit drug use.  ADVANCED DIRECTIVES: Not in place.     HEALTH MAINTENANCE: History  Substance Use Topics  . Smoking status: Never Smoker   . Smokeless tobacco: Never Used  . Alcohol Use: No     Mammogram: 03/30/2013  Colonoscopy: 06/22/2012 repeat recommended in 10 years  Bone Density Scan: unknown  Pap Smear: 03/2013  Eye Exam: 12/2013 Vitamin D Level: unknown  Lipid Panel: 12/02/2013    Allergies  Allergen Reactions  . Codeine Other (See Comments)    HALLUCINATIONS  . Morphine  And Related Nausea Only  . Sulfa Antibiotics Rash    Current Outpatient Prescriptions  Medication Sig Dispense Refill  . Benfotiamine 150 MG CAPS Take 150 mg by mouth daily.       . Biotin 5000 MCG CAPS Take by mouth.      . gabapentin (NEURONTIN) 100 MG capsule 3 (three) times daily. Take 3 capsules three times a day      . Multiple Vitamins-Minerals (ALIVE WOMENS 50+ PO) Take 2 each by mouth daily. Take 2 gummies per day      . simvastatin (ZOCOR) 40 MG tablet Take 40 mg by mouth every evening.       No current facility-administered medications for this visit.    OBJECTIVE: Filed Vitals:   01/26/14 1253  BP: 156/74  Pulse: 83  Temp: 98.3 F (36.8 C)  Resp: 18     Body mass index is 22.38 kg/(m^2).     GENERAL: Patient is a well appearing female in no acute distress HEENT:  Sclerae anicteric.  Oropharynx clear and moist. No ulcerations or evidence of oropharyngeal candidiasis. Neck is supple.  NODES:  No cervical, supraclavicular, or axillary lymphadenopathy palpated.  BREAST EXAM:  Left breast with hyperpigmentation LUNGS:  Clear to auscultation bilaterally.  No wheezes or rhonchi. HEART:  Regular rate and rhythm. No murmur appreciated. ABDOMEN:  Soft, nontender.  Positive, normoactive bowel sounds. No organomegaly palpated. MSK:  No focal spinal tenderness to palpation. Full range of motion bilaterally in the upper extremities. EXTREMITIES:  No peripheral edema.   SKIN:  Clear with no obvious rashes or skin changes. No nail dyscrasia. NEURO:  Nonfocal. Well oriented.  Appropriate affect. ECOG FS:1 - Symptomatic but completely ambulatory  LAB RESULTS:  CMP     Component Value Date/Time   NA 143 12/29/2013 0942   NA 145 10/13/2013 0900   K 3.9 12/29/2013 0942   K 4.4 10/13/2013 0900   CL 102 10/13/2013 0900   CO2 26 12/29/2013 0942   CO2 29 10/13/2013 0900   GLUCOSE 101 12/29/2013 0942   GLUCOSE 156* 10/13/2013 0900   BUN 11.3 12/29/2013 0942   BUN 12 10/13/2013 0900    CREATININE 0.7 12/29/2013 0942   CREATININE 0.62 10/13/2013 0900   CALCIUM 10.1 12/29/2013 0942   CALCIUM 9.9 10/13/2013 0900   PROT 7.2 12/29/2013 0942   PROT 5.5* 09/24/2013 0548   ALBUMIN 4.0 12/29/2013 0942   ALBUMIN 2.0* 09/24/2013 0548   AST 24 12/29/2013 0942   AST 57* 09/24/2013 0548   ALT 21 12/29/2013 0942   ALT 100* 09/24/2013 0548   ALKPHOS 69 12/29/2013 0942   ALKPHOS 61 09/24/2013 0548   BILITOT 0.26 12/29/2013 0942   BILITOT 0.2* 09/24/2013 0548   GFRNONAA >90 10/13/2013 0900   GFRAA >90 10/13/2013 0900    I No results found for this basename: SPEP, UPEP,  kappa and lambda light chains    Lab Results  Component Value Date   WBC  6.0 12/29/2013   NEUTROABS 3.8 12/29/2013   HGB 11.6 12/29/2013   HCT 35.7 12/29/2013   MCV 90.4 12/29/2013   PLT 265 12/29/2013      Chemistry      Component Value Date/Time   NA 143 12/29/2013 0942   NA 145 10/13/2013 0900   K 3.9 12/29/2013 0942   K 4.4 10/13/2013 0900   CL 102 10/13/2013 0900   CO2 26 12/29/2013 0942   CO2 29 10/13/2013 0900   BUN 11.3 12/29/2013 0942   BUN 12 10/13/2013 0900   CREATININE 0.7 12/29/2013 0942   CREATININE 0.62 10/13/2013 0900      Component Value Date/Time   CALCIUM 10.1 12/29/2013 0942   CALCIUM 9.9 10/13/2013 0900   ALKPHOS 69 12/29/2013 0942   ALKPHOS 61 09/24/2013 0548   AST 24 12/29/2013 0942   AST 57* 09/24/2013 0548   ALT 21 12/29/2013 0942   ALT 100* 09/24/2013 0548   BILITOT 0.26 12/29/2013 0942   BILITOT 0.2* 09/24/2013 0548       No results found for this basename: LABCA2    No components found with this basename: LABCA125    No results found for this basename: INR,  in the last 168 hours  Urinalysis    Component Value Date/Time   COLORURINE YELLOW 09/23/2013 1545   APPEARANCEUR CLOUDY* 09/23/2013 1545   LABSPEC 1.014 09/23/2013 1545   LABSPEC 1.005 09/21/2013 1101   PHURINE 5.5 09/23/2013 1545   GLUCOSEU NEGATIVE 09/23/2013 1545   GLUCOSEU Negative 09/21/2013 1101   HGBUR NEGATIVE 09/23/2013 [place] 09/23/2013 [place] 09/23/2013 [place] 09/23/2013 1545   UROBILINOGEN 0.2 09/23/2013 1545   UROBILINOGEN 0.2 09/21/2013 1101   NITRITE NEGATIVE 09/23/2013 1545   LEUKOCYTESUR NEGATIVE 09/23/2013 1545    STUDIES: No results found.  ASSESSMENT: 68 y.o. [place], [place] woman with clinical left breast T2 Nx, stage IIA/IIB invasive ductal carcinoma, grade II-III, ER positive, PR negative, Ki-67 47%, HER-2/neu negative.   1. Patient had an echocardiogram on 05/06/2013 that demonstrated a LVEF of 60%  She then received neoadjuvant chemotherapy consisting of Doxorubicin/Cyclophosphamide x 4 cycles from on 05/13/2013 through 06/24/2013 on day 1 of a 14 day cycle with Neulasta injection for granulocyte support administered on day 2 of each cycle.   2. Patient then received neoadjuvant chemotherapy consisting of Paclitaxel/Carboplatin given weekly from 07/08/2013 - 09/02/13 .  She completed 9/12 planned. Discontiued to due neuropathy/rashes.   3.  She then underwent a left breast lumpectomy with sentinel node biopsy that demonstrated 0.4cm of residual invasive ductal carcinoma associated with fibrosis and inflammation.  Margins were negative.  One sentinel node was negative.  Her prognostic markers demonstrated ER 52%, PR negative, HER-2/neu negative.  Her original pathology had demonstrated a triple negative tumor.    4.  Patient underwent adjuvant radiation from 11/14/13 through 12/30/13.    5. Patient to start Arimidex daily in May, 2015.  A total of 5 years of therapy are planned.  PLAN:   Ms. Mabin is doing well today.  We reviewed her health maintenance and her recent eye exam appointment.  She completed radiation therapy and tolerated it very well.  We discussed adjuvant anti-estrogen therapy again in detail.  She would really like to have a bone density done first.  She is almost sure that she has had one before and that it demonstrated osteopenia.  The  date of the last bone density  she now recalls was more than 2 years ago.  I ordered another one to be done within the week.  I will then review the results and call her and discuss them and her best plan of care.  The patient and her husband are in agreement with this plan.    The patient will return to clinic in 3 months for labs and f/u.   She knows to call us in the interim for any questions or concerns.  We can certainly see her sooner if needed.  I spent 25 minutes counseling the patient face to face.  The total time spent in the appointment was 30 minutes.  Minette Headland, [place] [phone] 01/26/2014 1:35 PM

## 2014-02-01 MED ORDER — ANASTROZOLE 1 MG PO TABS: 1.0000 mg | ORAL_TABLET | Freq: Every day | ORAL | Status: DC

## 2014-02-02 ENCOUNTER — Ambulatory Visit
Admission: RE | Admit: 2014-02-02 | Discharge: 2014-02-02 | Disposition: A | Discharge status: Home / Self Care | Payer: Medicare Other | Source: Ambulatory Visit | Attending: Radiation Oncology | Admitting: Radiation Oncology

## 2014-02-02 VITALS — BP 164/75 | HR 63 | Temp 97.7°F | Wt 113.0 lb

## 2014-02-02 DIAGNOSIS — C50119 Malignant neoplasm of central portion of unspecified female breast: Secondary | ICD-10-CM

## 2014-02-02 NOTE — Progress Notes (Signed)
One month follow up completion of radiation to left breast.Denies pain.BP up.Continues to take gabapentin 3 tabs three times daily.To pick up arimidex today or tomorrow and start at that point.Skin looks good, using Nivea with vitamin e.

## 2014-02-02 NOTE — Progress Notes (Signed)
   Department of Radiation Oncology  Phone:  [phone] Fax:        [phone]   Name: Ellen Beltran MRN: [id number]  DOB: [birth year]  Date: 02/02/2014  Follow Up Visit Note  Diagnosis: T2N0 Left breast cancer  Summary and Interval since last radiation: Left breast to a total dose of 61 Gy completed 12/30/13  Interval History: Ellen Beltran presents today for routine followup.  She has done well since radiation. She has upped her neurontin to 300 mg three times a day. She is not really noticing much of a difference. She is using vitamin E cream and is going to pick up her Arimidex prescription today.   Allergies:  Allergies  Allergen Reactions  . Codeine Other (See Comments)    HALLUCINATIONS  . Morphine And Related Nausea Only  . Sulfa Antibiotics Rash    Medications:  Current Outpatient Prescriptions  Medication Sig Dispense Refill  . Benfotiamine 150 MG CAPS Take 150 mg by mouth daily.       . Biotin 5000 MCG CAPS Take by mouth.      . gabapentin (NEURONTIN) 100 MG capsule 3 (three) times daily. Take 3 capsules three times a day      . Multiple Vitamins-Minerals (ALIVE WOMENS 50+ PO) Take 2 each by mouth daily. Take 2 gummies per day      . simvastatin (ZOCOR) 40 MG tablet Take 40 mg by mouth every evening.      Marland Kitchen anastrozole (ARIMIDEX) 1 MG tablet Take 1 tablet (1 mg total) by mouth daily.  30 tablet  3   No current facility-administered medications for this encounter.    Physical Exam:  Filed Vitals:   02/02/14 1342  BP: 164/75  Pulse: 63  Temp: 97.7 F (36.5 C)  Weight: 113 lb (51.256 kg)   upturned nipple. Hyper pigmentation above scar.   IMPRESSION: Ellen Beltran is a 68 y.o. female s/p breast conservation with an adequate cosmetic response and resolving acute effects of treatment  PLAN:  Continue Vitamin E to areas of scar/hyperpigmentation. Follow up with surgery (next week for port removal) and follow up with medical oncology. Discussed yearly mammograms and  importance of sun protection in the treated area. Follow up prn and call with questions.     Ellen Silversmith, MD

## 2014-02-03 ENCOUNTER — Ambulatory Visit
Admission: RE | Admit: 2014-02-03 | Discharge: 2014-02-03 | Disposition: A | Discharge status: Home / Self Care | Payer: Medicare Other | Source: Ambulatory Visit | Attending: Adult Health | Admitting: Adult Health

## 2014-02-03 DIAGNOSIS — E2839 Other primary ovarian failure: Secondary | ICD-10-CM

## 2014-02-10 DIAGNOSIS — Z452 Encounter for adjustment and management of vascular access device: Secondary | ICD-10-CM

## 2014-02-15 ENCOUNTER — Telehealth: Payer: Self-pay

## 2014-02-15 NOTE — Telephone Encounter (Signed)
Rcvd operative report from [facility] 02/10/14 Dr. Lucia Gaskins.  Copy to Mamoru Takeshita.  Original to scan.

## 2014-02-17 ENCOUNTER — Telehealth: Payer: Self-pay

## 2014-02-17 NOTE — Telephone Encounter (Signed)
Received by mail from [facility] bone density report dated 02/03/14.  Copy to [place].  Original sent to scan.

## 2014-03-10 ENCOUNTER — Ambulatory Visit (INDEPENDENT_AMBULATORY_CARE_PROVIDER_SITE_OTHER): Payer: Medicare Other | Admitting: Surgery

## 2014-03-10 ENCOUNTER — Encounter (INDEPENDENT_AMBULATORY_CARE_PROVIDER_SITE_OTHER): Payer: Self-pay | Admitting: Surgery

## 2014-03-10 VITALS — BP 118/62 | HR 68 | Temp 98.0°F | Resp 18 | Ht 59.0 in | Wt 112.0 lb

## 2014-03-10 DIAGNOSIS — C50112 Malignant neoplasm of central portion of left female breast: Secondary | ICD-10-CM

## 2014-03-10 DIAGNOSIS — C50119 Malignant neoplasm of central portion of unspecified female breast: Secondary | ICD-10-CM

## 2014-03-10 NOTE — Progress Notes (Signed)
Re:   Ellen Beltran DOB:   1945-09-22 MRN:   [id number]  [place]  ASSESSMENT AND PLAN: 1.  Left breast cancer, T2, N0, 12 o'clock  Final path (Accession: [id number]) IDC with papillary features,  showed 0.4 cm invasive ductal ca, ER - 52%, PR - 0%, Her2Neu - neg, Ki67 - 47%  (Interestingly, her core biopsy showed triple neg tumor, but her final path shows ER positivity)  2.4 cm on MRI - 05/23/2013 - pre neoadjuvant treatment  Oncology - [facility]  Left breast lumpectomy and left axillary SLNBx - 10/17/2013 - final path   She'll see me back in Sept/Oct - about one year from her original diagnosis.   2.  Power port, right subclavian - 05/02/2013 - [facility]. [facility]  Removed at the [facility] on 02/10/2014.    Today was a quick check of this incision.  She'll see me back in Sept/Oct - about one year from her original diagnosis. 3. HTN 4.  Hypercholesterolemia  Chief Complaint  Patient presents with  . Routine Post Op    power port removal   REFERRING PHYSICIAN: Woody Seller, MD  HISTORY OF PRESENT ILLNESS: Ellen Beltran is a 68 y.o. (DOB: [birth year])  white  female whose primary care physician is Woody Seller, MD and comes for follow up of left breast lumpectomy and left axillary SLNBx. She comes by herself. She is doing well from the port removal.  She also has a knot at her left breast incision post radiation tx.  Breast Cancer History: She went for a routine mammogram 03/30/2013 at [facility].  She had a suspicious area at 12 o'clock in the left breast.  Her last mammogram was 2012. A biopsy 04/15/2013 revealed (Accession: [id number]) triple negative invasive cancer with papillary features. An MRI on 04/25/2013 show a 2.4 x 2.4 x 1.3 cm mass at the 12 o'clock position of the left breast. She is on hormones, which she has stopped.  She has no family history of breast cancer.  She had a hysterectomy in 2002 for benign disease.  Past Medical History  Diagnosis  Date  . Hyperlipidemia   . Osteopenia   . Arthritis   . Cancer     left breast  . Neuropathy due to chemotherapeutic drug     hands and feet  . History of blood transfusion 09/24/2013  . Dental bridge present     left lower  . History of chemotherapy     stopped 08/2013     Current Outpatient Prescriptions  Medication Sig Dispense Refill  . anastrozole (ARIMIDEX) 1 MG tablet Take 1 tablet (1 mg total) by mouth daily.  30 tablet  3  . Benfotiamine 150 MG CAPS Take 150 mg by mouth daily.       . Biotin 5000 MCG CAPS Take by mouth.      . gabapentin (NEURONTIN) 100 MG capsule 3 (three) times daily. Take 3 capsules three times a day      . Multiple Vitamins-Minerals (ALIVE WOMENS 50+ PO) Take 2 each by mouth daily. Take 2 gummies per day      . simvastatin (ZOCOR) 40 MG tablet Take 40 mg by mouth every evening.       No current facility-administered medications for this visit.    Allergies  Allergen Reactions  . Codeine Other (See Comments)    HALLUCINATIONS  . Morphine And Related Nausea Only  . Sulfa Antibiotics Rash    REVIEW OF SYSTEMS:  Cardiac:  Hypertension since about 2004. Gastrointestinal:  No history of stomach disease.  No history of liver disease.  No history of gall bladder disease.  No history of pancreas disease.  Negative colonoscopy by Dr. Henrene Pastor 2014. Urologic:  No history of kidney stones.  No history of bladder infections. GYN:  Hysterectomy 2002.  Sees Dr. Nori Riis from a Gyn standpoint.  SOCIAL and FAMILY HISTORY: Married. 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 Has 2 daughters - ages 80 and 18. Her mother had lung cancer and needed a porta cath. Her husband had hernia surgery by Dr. Prince Solian. [She saw Dr. Shaaron Adler getting treatment at [facility] - 10/2013]  PHYSICAL EXAM: BP 118/62  Pulse 68  Temp(Src) 98 F (36.7 C)  Resp 18  Ht 4' 11" (1.499 m)  Wt 112 lb (50.803 kg)  BMI 22.61 kg/m2  General: WN WF who is alert and  generally healthy appearing.  HEENT:  Pupils equal.  Dentition good. NECK:  Supple.  No thyroid mass. LYMPH NODES:  No cervical, supraclavicular, or axillary adenopathy. BREASTS -  RIGHT:  Port upper inner quadrant incision looks good.   LEFT:  Pigmentation from the radiation tx.  Smooth mass at biopsy site at 12 o'clock. UPPER EXTREMITIES:  No evidence of lymphedema.  He skin is slightly tanned.  DATA REVIEWED: Path report to patient.  Alphonsa Overall, MD,  [facility], [address]  [place], [place]    [place] Phone:  [phone] FAX:  [phone]

## 2014-03-13 ENCOUNTER — Telehealth: Payer: Self-pay | Admitting: Oncology

## 2014-03-13 ENCOUNTER — Telehealth: Payer: Self-pay | Admitting: *Deleted

## 2014-03-13 NOTE — Telephone Encounter (Signed)
Patient calling needing to r/s appt with Charlestine Massed on 8/20 to the following week. No health issues or concerns, just other conflicting appts. Will send request to scheduling.

## 2014-03-13 NOTE — Telephone Encounter (Signed)
lmonvm advising the pt of her r/s aug appt from 8/20 to 05/11/2014 per pof.

## 2014-05-04 ENCOUNTER — Ambulatory Visit: Payer: Medicare Other | Admitting: Adult Health

## 2014-05-11 ENCOUNTER — Ambulatory Visit (HOSPITAL_BASED_OUTPATIENT_CLINIC_OR_DEPARTMENT_OTHER): Payer: Medicare Other | Admitting: Adult Health

## 2014-05-11 ENCOUNTER — Encounter: Payer: Self-pay | Admitting: Adult Health

## 2014-05-11 ENCOUNTER — Other Ambulatory Visit: Payer: Self-pay | Admitting: *Deleted

## 2014-05-11 ENCOUNTER — Telehealth: Payer: Self-pay | Admitting: Adult Health

## 2014-05-11 VITALS — BP 150/60 | HR 59 | Temp 98.6°F | Resp 18 | Ht 59.0 in | Wt 108.8 lb

## 2014-05-11 DIAGNOSIS — C50919 Malignant neoplasm of unspecified site of unspecified female breast: Secondary | ICD-10-CM

## 2014-05-11 DIAGNOSIS — C50119 Malignant neoplasm of central portion of unspecified female breast: Secondary | ICD-10-CM

## 2014-05-11 DIAGNOSIS — M899 Disorder of bone, unspecified: Secondary | ICD-10-CM

## 2014-05-11 DIAGNOSIS — E559 Vitamin D deficiency, unspecified: Secondary | ICD-10-CM

## 2014-05-11 DIAGNOSIS — Z853 Personal history of malignant neoplasm of breast: Secondary | ICD-10-CM

## 2014-05-11 DIAGNOSIS — M949 Disorder of cartilage, unspecified: Secondary | ICD-10-CM

## 2014-05-11 DIAGNOSIS — Z170 Estrogen receptor positive status [ER+]: Secondary | ICD-10-CM

## 2014-05-11 DIAGNOSIS — C50112 Malignant neoplasm of central portion of left female breast: Secondary | ICD-10-CM

## 2014-05-11 MED ORDER — GABAPENTIN 100 MG PO CAPS: 100.0000 mg | ORAL_CAPSULE | Freq: Three times a day (TID) | ORAL | Status: DC

## 2014-05-11 MED ORDER — ANASTROZOLE 1 MG PO TABS: 1.0000 mg | ORAL_TABLET | Freq: Every day | ORAL | Status: DC

## 2014-05-11 NOTE — Progress Notes (Signed)
Pt states that she recently had her pna vaccine (02-27-2014)  and her shingles vaccine (03-17-2014).

## 2014-05-11 NOTE — Progress Notes (Signed)
ID: Ellen Beltran OB: [birth year]  MR#: [id number]  OBS#:[id number]  PCP: Woody Seller, MD GYN:   SU: Dr. Lucia Gaskins OTHER MD:  CHIEF COMPLAINT:  Patient is a 68 y/o woman with left sided breast cancer here to start adjuvant anti-estrogen therapy  BREAST CANCER HISTORY:  Patient underwent annual screening mammogram in 03/2013 and mammography noted a mass at the 12 o'clock position. Diagnostic mammogram and ultrasound of the left breast was performed on 03/30/2013. The ultrasound showed a 1.8 cm mass in the 12 o'clock position of the left breast. Bilateral breast MRI performed on 04/25/2013 showed a 2.4 cm area of concern in the 12 o'clock position of the left breast. There were no abnormal appearing axillary subpectoral or internal mammary lymph nodes. Left breast needle core biopsy performed on 04/15/2013 revealed invasive carcinoma with papillary features consistent with a ductal phenotype. Tumor grade was intermediate to high grade, estrogen receptor negative, progesterone receptor negative, with a proliferation marker Ki-67 of 47%, HER-2/neu by CISH showed no amplification.    CURRENT THERAPY: Arimidex daily  INTERVAL HISTORY: Patient is doing well today.  She is here today for f/u.  She is taking Arimidex daily and tolerating it well.  She does have occasional hot flashes, however she is tolerating them.  She denies joint aches, dryness, or any further concerns.  We reviewed her health maintenance below.    REVIEW OF SYSTEMS: A 10 point review of systems was conducted and is otherwise negative except for what is noted above.     PAST MEDICAL HISTORY: Past Medical History  Diagnosis Date  . Hyperlipidemia   . Osteopenia   . Arthritis   . Cancer     left breast  . Neuropathy due to chemotherapeutic drug     hands and feet  . History of blood transfusion 09/24/2013  . Dental bridge present     left lower  . History of chemotherapy     stopped 08/2013    PAST SURGICAL  HISTORY: Past Surgical History  Procedure Laterality Date  . Portacath placement N/A 05/02/2013    Procedure: INSERTION PORT-A-CATH;  Surgeon: Shann Medal, MD;  Location: [facility];  Service: General;  Laterality: N/A;  . Total abdominal hysterectomy w/ bilateral salpingoophorectomy  05/06/2001  . Pelvic floor repair  05/06/2001    cardinal uterosacral colposuspension  . Cardiac catheterization  08/31/2003    no disease  . Colonoscopy with propofol  06/22/2012  . Tubal ligation  age 68  . Breast lumpectomy with needle localization and axillary sentinel lymph node bx Left 10/17/2013    Procedure: BREAST LUMPECTOMY WITH NEEDLE LOCALIZATION AND AXILLARY SENTINEL LYMPH NODE BX;  Surgeon: Shann Medal, MD;  Location: [facility];  Service: General;  Laterality: Left;    FAMILY HISTORY Family History  Problem Relation Age of Onset  . Heart disease Father   . Heart disease Brother   . Lung cancer Mother     GYNECOLOGIC HISTORY: Menarche at age 72, G65 P2, was on Premarin, an estrogen replacement patch.  This was stopped when diagnosed with breast cancer.    SOCIAL HISTORY: Lives with husband in [place].  Patient is retired.  No ETOH, tobacco, or illicit drug use.      ADVANCED DIRECTIVES: Not in place.     HEALTH MAINTENANCE: History  Substance Use Topics  . Smoking status: Never Smoker   . Smokeless tobacco: Never Used  . Alcohol Use: No     Mammogram: 03/30/2013  Colonoscopy: 06/22/2012 repeat recommended in 10 years  Bone Density Scan: 02/03/14 osteopenia Pap Smear: 03/2013  Eye Exam: 12/2013 Vitamin D Level: unknown  Lipid Panel: 12/02/2013    Allergies  Allergen Reactions  . Codeine Other (See Comments)    HALLUCINATIONS  . Morphine And Related Nausea Only  . Sulfa Antibiotics Rash    Current Outpatient Prescriptions  Medication Sig Dispense Refill  . anastrozole (ARIMIDEX) 1 MG tablet Take 1 tablet (1 mg total) by mouth daily.  30 tablet  3  .  Benfotiamine 150 MG CAPS Take 150 mg by mouth daily.       . Biotin 5000 MCG CAPS Take by mouth.      . gabapentin (NEURONTIN) 100 MG capsule 3 (three) times daily. Take 2 capsules three times a day      . Multiple Vitamins-Minerals (ALIVE WOMENS 50+ PO) Take 2 each by mouth daily. Take 2 gummies per day      . simvastatin (ZOCOR) 40 MG tablet Take 40 mg by mouth every evening.       No current facility-administered medications for this visit.    OBJECTIVE: Filed Vitals:   05/11/14 1110  BP: 150/60  Pulse: 59  Temp: 98.6 F (37 C)  Resp: 18     Body mass index is 21.96 kg/(m^2).     GENERAL: Patient is a well appearing female in no acute distress HEENT:  Sclerae anicteric.  Oropharynx clear and moist. No ulcerations or evidence of oropharyngeal candidiasis. Neck is supple.  NODES:  No cervical, supraclavicular, or axillary lymphadenopathy palpated.  BREAST EXAM:  Left breast s/p lumpectomy, no nodularity, radiation changes noted to left breast, no sign of recurrence, right breast no masses or nodules, benign bilateral breast exam LUNGS:  Clear to auscultation bilaterally.  No wheezes or rhonchi. HEART:  Regular rate and rhythm. No murmur appreciated. ABDOMEN:  Soft, nontender.  Positive, normoactive bowel sounds. No organomegaly palpated. MSK:  No focal spinal tenderness to palpation. Full range of motion bilaterally in the upper extremities. EXTREMITIES:  No peripheral edema.   SKIN:  Clear with no obvious rashes or skin changes. No nail dyscrasia. NEURO:  Nonfocal. Well oriented.  Appropriate affect. ECOG FS:1 - Symptomatic but completely ambulatory  LAB RESULTS:  CMP     Component Value Date/Time   NA 143 12/29/2013 0942   NA 145 10/13/2013 0900   K 3.9 12/29/2013 0942   K 4.4 10/13/2013 0900   CL 102 10/13/2013 0900   CO2 26 12/29/2013 0942   CO2 29 10/13/2013 0900   GLUCOSE 101 12/29/2013 0942   GLUCOSE 156* 10/13/2013 0900   BUN 11.3 12/29/2013 0942   BUN 12 10/13/2013 0900    CREATININE 0.7 12/29/2013 0942   CREATININE 0.62 10/13/2013 0900   CALCIUM 10.1 12/29/2013 0942   CALCIUM 9.9 10/13/2013 0900   PROT 7.2 12/29/2013 0942   PROT 5.5* 09/24/2013 0548   ALBUMIN 4.0 12/29/2013 0942   ALBUMIN 2.0* 09/24/2013 0548   AST 24 12/29/2013 0942   AST 57* 09/24/2013 0548   ALT 21 12/29/2013 0942   ALT 100* 09/24/2013 0548   ALKPHOS 69 12/29/2013 0942   ALKPHOS 61 09/24/2013 0548   BILITOT 0.26 12/29/2013 0942   BILITOT 0.2* 09/24/2013 0548   GFRNONAA >90 10/13/2013 0900   GFRAA >90 10/13/2013 0900    I No results found for this basename: SPEP,  UPEP,   kappa and lambda light chains    Lab Results  Component Value  Date   WBC 6.0 12/29/2013   NEUTROABS 3.8 12/29/2013   HGB 11.6 12/29/2013   HCT 35.7 12/29/2013   MCV 90.4 12/29/2013   PLT 265 12/29/2013      Chemistry      Component Value Date/Time   NA 143 12/29/2013 0942   NA 145 10/13/2013 0900   K 3.9 12/29/2013 0942   K 4.4 10/13/2013 0900   CL 102 10/13/2013 0900   CO2 26 12/29/2013 0942   CO2 29 10/13/2013 0900   BUN 11.3 12/29/2013 0942   BUN 12 10/13/2013 0900   CREATININE 0.7 12/29/2013 0942   CREATININE 0.62 10/13/2013 0900      Component Value Date/Time   CALCIUM 10.1 12/29/2013 0942   CALCIUM 9.9 10/13/2013 0900   ALKPHOS 69 12/29/2013 0942   ALKPHOS 61 09/24/2013 0548   AST 24 12/29/2013 0942   AST 57* 09/24/2013 0548   ALT 21 12/29/2013 0942   ALT 100* 09/24/2013 0548   BILITOT 0.26 12/29/2013 0942   BILITOT 0.2* 09/24/2013 0548       No results found for this basename: LABCA2    No components found with this basename: LABCA125    No results found for this basename: INR,  in the last 168 hours  Urinalysis    Component Value Date/Time   COLORURINE YELLOW 09/23/2013 1545   APPEARANCEUR CLOUDY* 09/23/2013 1545   LABSPEC 1.014 09/23/2013 1545   LABSPEC 1.005 09/21/2013 1101   PHURINE 5.5 09/23/2013 1545   GLUCOSEU NEGATIVE 09/23/2013 1545   GLUCOSEU Negative 09/21/2013 1101   HGBUR NEGATIVE 09/23/2013 [place] 09/23/2013 [place] 09/23/2013 [place] 09/23/2013 1545   UROBILINOGEN 0.2 09/23/2013 1545   UROBILINOGEN 0.2 09/21/2013 1101   NITRITE NEGATIVE 09/23/2013 1545   LEUKOCYTESUR NEGATIVE 09/23/2013 1545    STUDIES: No results found.  ASSESSMENT: 68 y.o. [place], [place] woman with clinical left breast T2 Nx, stage IIA/IIB invasive ductal carcinoma, grade II-III, ER positive, PR negative, Ki-67 47%, HER-2/neu negative.   1. Patient had an echocardiogram on 05/06/2013 that demonstrated a LVEF of 60%  She then received neoadjuvant chemotherapy consisting of Doxorubicin/Cyclophosphamide x 4 cycles from on 05/13/2013 through 06/24/2013 on day 1 of a 14 day cycle with Neulasta injection for granulocyte support administered on day 2 of each cycle.   2. Patient then received neoadjuvant chemotherapy consisting of Paclitaxel/Carboplatin given weekly from 07/08/2013 - 09/02/13 .  She completed 9/12 planned. Discontiued to due neuropathy/rashes.   3.  She then underwent a left breast lumpectomy with sentinel node biopsy that demonstrated 0.4cm of residual invasive ductal carcinoma associated with fibrosis and inflammation.  Margins were negative.  One sentinel node was negative.  Her prognostic markers demonstrated ER 52%, PR negative, HER-2/neu negative.  Her original pathology had demonstrated a triple negative tumor.    4.  Patient underwent adjuvant radiation from 11/14/13 through 12/30/13.    5. Patient to start Arimidex daily in May, 2015.  A total of 5 years of therapy are planned.  PLAN:   Rowyn is doing well today.  We discussed her starting arimidex daily and she appears to be tolerating it well.  She will continue this.    We reviewed her bone density results and they do indicate osteopenia.  She is taking Calcium BID and I recommended weight bearing exercises.  We will check a Vitamin D level at her next appointment due to this.    She  will be due for a  mammogram in 06/2014.  I informed her of this.  She has no sign of recurrence.  I recommended a healthy diet, exercise, and monthly breast exams.     The patient will return to clinic in 4 months for labs and f/u.   She knows to call us in the interim for any questions or concerns.  We can certainly see her sooner if needed.  I spent 25 minutes counseling the patient face to face.  The total time spent in the appointment was 30 minutes.  Minette Headland, [place] [phone] 05/11/2014 11:16 AM

## 2014-05-11 NOTE — Telephone Encounter (Signed)
per pof to sch pt appt-gave pt copy of sch °

## 2014-05-11 NOTE — Addendum Note (Signed)
Addended by: Minette Headland on: 05/11/2014 04:26 PM   Modules accepted: Orders

## 2014-05-12 ENCOUNTER — Telehealth: Payer: Self-pay | Admitting: Adult Health

## 2014-05-12 NOTE — Telephone Encounter (Signed)
, °

## 2014-06-14 ENCOUNTER — Ambulatory Visit (INDEPENDENT_AMBULATORY_CARE_PROVIDER_SITE_OTHER): Payer: Medicare Other | Admitting: Surgery

## 2014-06-22 ENCOUNTER — Telehealth: Payer: Self-pay | Admitting: Hematology and Oncology

## 2014-06-22 NOTE — Telephone Encounter (Signed)
Lvm advising appt chg from 12/31 to 09/22/14 due to md on pal. Also mailed revised appt calendar.

## 2014-07-11 ENCOUNTER — Other Ambulatory Visit: Payer: Self-pay | Admitting: Adult Health

## 2014-07-11 ENCOUNTER — Ambulatory Visit
Admission: RE | Admit: 2014-07-11 | Discharge: 2014-07-11 | Disposition: A | Discharge status: Home / Self Care | Payer: Medicare Other | Source: Ambulatory Visit | Attending: Adult Health | Admitting: Adult Health

## 2014-07-11 DIAGNOSIS — Z853 Personal history of malignant neoplasm of breast: Secondary | ICD-10-CM

## 2014-07-26 ENCOUNTER — Inpatient Hospital Stay (HOSPITAL_COMMUNITY)
Admission: EM | Admit: 2014-07-26 | Discharge: 2014-07-29 | DRG: 871 | Disposition: A | Discharge status: Home / Self Care | Payer: Medicare Other | Attending: Internal Medicine | Admitting: Internal Medicine

## 2014-07-26 ENCOUNTER — Inpatient Hospital Stay (HOSPITAL_COMMUNITY): Payer: Medicare Other

## 2014-07-26 ENCOUNTER — Emergency Department (HOSPITAL_COMMUNITY): Payer: Medicare Other

## 2014-07-26 ENCOUNTER — Encounter (HOSPITAL_COMMUNITY): Payer: Self-pay | Admitting: Emergency Medicine

## 2014-07-26 DIAGNOSIS — D72829 Elevated white blood cell count, unspecified: Secondary | ICD-10-CM | POA: Diagnosis present

## 2014-07-26 DIAGNOSIS — C50112 Malignant neoplasm of central portion of left female breast: Secondary | ICD-10-CM | POA: Diagnosis present

## 2014-07-26 DIAGNOSIS — Z170 Estrogen receptor positive status [ER+]: Secondary | ICD-10-CM

## 2014-07-26 DIAGNOSIS — I951 Orthostatic hypotension: Secondary | ICD-10-CM | POA: Diagnosis present

## 2014-07-26 DIAGNOSIS — A419 Sepsis, unspecified organism: Principal | ICD-10-CM

## 2014-07-26 DIAGNOSIS — R6521 Severe sepsis with septic shock: Secondary | ICD-10-CM | POA: Diagnosis present

## 2014-07-26 DIAGNOSIS — R55 Syncope and collapse: Secondary | ICD-10-CM

## 2014-07-26 DIAGNOSIS — Z79899 Other long term (current) drug therapy: Secondary | ICD-10-CM

## 2014-07-26 DIAGNOSIS — Z9221 Personal history of antineoplastic chemotherapy: Secondary | ICD-10-CM | POA: Diagnosis not present

## 2014-07-26 DIAGNOSIS — Z9010 Acquired absence of unspecified breast and nipple: Secondary | ICD-10-CM | POA: Diagnosis present

## 2014-07-26 DIAGNOSIS — J189 Pneumonia, unspecified organism: Secondary | ICD-10-CM | POA: Diagnosis present

## 2014-07-26 DIAGNOSIS — E876 Hypokalemia: Secondary | ICD-10-CM | POA: Diagnosis present

## 2014-07-26 DIAGNOSIS — E785 Hyperlipidemia, unspecified: Secondary | ICD-10-CM | POA: Diagnosis present

## 2014-07-26 DIAGNOSIS — C50919 Malignant neoplasm of unspecified site of unspecified female breast: Secondary | ICD-10-CM

## 2014-07-26 DIAGNOSIS — Z853 Personal history of malignant neoplasm of breast: Secondary | ICD-10-CM | POA: Diagnosis present

## 2014-07-26 LAB — URINALYSIS, ROUTINE W REFLEX MICROSCOPIC
Bilirubin Urine: NEGATIVE
Glucose, UA: NEGATIVE mg/dL
HGB URINE DIPSTICK: NEGATIVE
Ketones, ur: NEGATIVE mg/dL
NITRITE: NEGATIVE
Protein, ur: 30 mg/dL — AB
SPECIFIC GRAVITY, URINE: 1.01 (ref 1.005–1.030)
Urobilinogen, UA: 1 mg/dL (ref 0.0–1.0)
pH: 5.5 (ref 5.0–8.0)

## 2014-07-26 LAB — COMPREHENSIVE METABOLIC PANEL
ALT: 41 U/L — ABNORMAL HIGH (ref 0–35)
AST: 43 U/L — ABNORMAL HIGH (ref 0–37)
Albumin: 3.1 g/dL — ABNORMAL LOW (ref 3.5–5.2)
Alkaline Phosphatase: 96 U/L (ref 39–117)
Anion gap: 15 (ref 5–15)
BUN: 16 mg/dL (ref 6–23)
CALCIUM: 10.3 mg/dL (ref 8.4–10.5)
CO2: 26 mEq/L (ref 19–32)
Chloride: 93 mEq/L — ABNORMAL LOW (ref 96–112)
Creatinine, Ser: 0.9 mg/dL (ref 0.50–1.10)
GFR calc non Af Amer: 65 mL/min — ABNORMAL LOW (ref >90)
GFR, EST AFRICAN AMERICAN: 76 mL/min — AB (ref >90)
GLUCOSE: 172 mg/dL — AB (ref 70–99)
Potassium: 3.5 mEq/L — ABNORMAL LOW (ref 3.7–5.3)
Sodium: 134 mEq/L — ABNORMAL LOW (ref 137–147)
TOTAL PROTEIN: 8.1 g/dL (ref 6.0–8.3)
Total Bilirubin: 0.6 mg/dL (ref 0.3–1.2)

## 2014-07-26 LAB — CBC WITH DIFFERENTIAL/PLATELET
Basophils Absolute: 0 10*3/uL (ref 0.0–0.1)
Basophils Relative: 0 % (ref 0–1)
Eosinophils Absolute: 0 10*3/uL (ref 0.0–0.7)
Eosinophils Relative: 0 % (ref 0–5)
HCT: 32.9 % — ABNORMAL LOW (ref 36.0–46.0)
HEMOGLOBIN: 11 g/dL — AB (ref 12.0–15.0)
LYMPHS ABS: 1.2 10*3/uL (ref 0.7–4.0)
Lymphocytes Relative: 6 % — ABNORMAL LOW (ref 12–46)
MCH: 30.2 pg (ref 26.0–34.0)
MCHC: 33.4 g/dL (ref 30.0–36.0)
MCV: 90.4 fL (ref 78.0–100.0)
MONOS PCT: 10 % (ref 3–12)
Monocytes Absolute: 2 10*3/uL — ABNORMAL HIGH (ref 0.1–1.0)
NEUTROS PCT: 84 % — AB (ref 43–77)
Neutro Abs: 16.9 10*3/uL — ABNORMAL HIGH (ref 1.7–7.7)
Platelets: 250 10*3/uL (ref 150–400)
RBC: 3.64 MIL/uL — AB (ref 3.87–5.11)
RDW: 13.5 % (ref 11.5–15.5)
WBC: 20.1 10*3/uL — ABNORMAL HIGH (ref 4.0–10.5)

## 2014-07-26 LAB — URINE MICROSCOPIC-ADD ON

## 2014-07-26 LAB — I-STAT CG4 LACTIC ACID, ED: Lactic Acid, Venous: 1.35 mmol/L (ref 0.5–2.2)

## 2014-07-26 LAB — MRSA PCR SCREENING: MRSA BY PCR: NEGATIVE

## 2014-07-26 MED ORDER — SODIUM CHLORIDE 0.9 % IV SOLN
INTRAVENOUS | Status: DC
  Administered 2014-07-26: 12:00:00 via INTRAVENOUS

## 2014-07-26 MED ORDER — POTASSIUM CHLORIDE CRYS ER 20 MEQ PO TBCR
40.0000 meq | EXTENDED_RELEASE_TABLET | Freq: Once | ORAL | Status: AC
  Administered 2014-07-26: 40 meq via ORAL
  Filled 2014-07-26: qty 2

## 2014-07-26 MED ORDER — BENFOTIAMINE 150 MG PO CAPS: 150.0000 mg | ORAL_CAPSULE | Freq: Every day | ORAL | Status: DC

## 2014-07-26 MED ORDER — ACETAMINOPHEN 650 MG RE SUPP: 650.0000 mg | Freq: Four times a day (QID) | RECTAL | Status: DC | PRN

## 2014-07-26 MED ORDER — GUAIFENESIN ER 600 MG PO TB12
600.0000 mg | ORAL_TABLET | Freq: Two times a day (BID) | ORAL | Status: DC
  Administered 2014-07-26 – 2014-07-29 (×7): 600 mg via ORAL
  Filled 2014-07-26 (×8): qty 1

## 2014-07-26 MED ORDER — ACETAMINOPHEN 500 MG PO TABS
1000.0000 mg | ORAL_TABLET | Freq: Once | ORAL | Status: AC
Start: 2014-07-26 — End: 2014-07-26
  Administered 2014-07-26: 1000 mg via ORAL
  Filled 2014-07-26: qty 2

## 2014-07-26 MED ORDER — SODIUM CHLORIDE 0.9 % IV BOLUS (SEPSIS)
1000.0000 mL | Freq: Once | INTRAVENOUS | Status: AC
Start: 2014-07-26 — End: 2014-07-26
  Administered 2014-07-26: 1000 mL via INTRAVENOUS

## 2014-07-26 MED ORDER — GABAPENTIN 100 MG PO CAPS
200.0000 mg | ORAL_CAPSULE | Freq: Three times a day (TID) | ORAL | Status: DC
  Administered 2014-07-26 – 2014-07-29 (×9): 200 mg via ORAL
  Filled 2014-07-26 (×12): qty 2

## 2014-07-26 MED ORDER — ONDANSETRON HCL 4 MG PO TABS: 4.0000 mg | ORAL_TABLET | Freq: Four times a day (QID) | ORAL | Status: DC | PRN

## 2014-07-26 MED ORDER — SIMVASTATIN 40 MG PO TABS
40.0000 mg | ORAL_TABLET | Freq: Every evening | ORAL | Status: DC
  Administered 2014-07-26 – 2014-07-28 (×3): 40 mg via ORAL
  Filled 2014-07-26 (×5): qty 1

## 2014-07-26 MED ORDER — ONDANSETRON HCL 4 MG/2ML IJ SOLN: 4.0000 mg | Freq: Four times a day (QID) | INTRAMUSCULAR | Status: DC | PRN

## 2014-07-26 MED ORDER — CEFTRIAXONE SODIUM 1 G IJ SOLR
1.0000 g | Freq: Once | INTRAMUSCULAR | Status: AC
  Administered 2014-07-26: 1 g via INTRAVENOUS
  Filled 2014-07-26: qty 10

## 2014-07-26 MED ORDER — ACETAMINOPHEN 325 MG PO TABS
650.0000 mg | ORAL_TABLET | Freq: Four times a day (QID) | ORAL | Status: DC | PRN
  Administered 2014-07-26: 650 mg via ORAL
  Filled 2014-07-26: qty 2

## 2014-07-26 MED ORDER — FLUTICASONE PROPIONATE 50 MCG/ACT NA SUSP
1.0000 | Freq: Every day | NASAL | Status: DC | PRN
  Filled 2014-07-26: qty 16

## 2014-07-26 MED ORDER — DEXTROSE 5 % IV SOLN
500.0000 mg | INTRAVENOUS | Status: DC
  Administered 2014-07-27 – 2014-07-28 (×2): 500 mg via INTRAVENOUS
  Filled 2014-07-26 (×2): qty 500

## 2014-07-26 MED ORDER — ANASTROZOLE 1 MG PO TABS
1.0000 mg | ORAL_TABLET | ORAL | Status: DC
  Administered 2014-07-26 – 2014-07-28 (×3): 1 mg via ORAL
  Filled 2014-07-26 (×5): qty 1

## 2014-07-26 MED ORDER — DEXTROSE 5 % IV SOLN
1.0000 g | INTRAVENOUS | Status: DC
  Administered 2014-07-26 – 2014-07-27 (×2): 1 g via INTRAVENOUS
  Filled 2014-07-26 (×2): qty 10

## 2014-07-26 MED ORDER — ANASTROZOLE 1 MG PO TABS
1.0000 mg | ORAL_TABLET | Freq: Every day | ORAL | Status: DC
  Filled 2014-07-26: qty 1

## 2014-07-26 MED ORDER — GADOBENATE DIMEGLUMINE 529 MG/ML IV SOLN
9.0000 mL | Freq: Once | INTRAVENOUS | Status: AC | PRN
  Administered 2014-07-26: 9 mL via INTRAVENOUS

## 2014-07-26 MED ORDER — ENOXAPARIN SODIUM 40 MG/0.4ML ~~LOC~~ SOLN
40.0000 mg | SUBCUTANEOUS | Status: DC
  Administered 2014-07-26 – 2014-07-28 (×3): 40 mg via SUBCUTANEOUS
  Filled 2014-07-26 (×5): qty 0.4

## 2014-07-26 MED ORDER — SODIUM CHLORIDE 0.9 % IV BOLUS (SEPSIS)
1000.0000 mL | Freq: Once | INTRAVENOUS | Status: AC
  Administered 2014-07-26: 1000 mL via INTRAVENOUS

## 2014-07-26 MED ORDER — DEXTROSE 5 % IV SOLN
500.0000 mg | Freq: Once | INTRAVENOUS | Status: AC
  Administered 2014-07-26: 500 mg via INTRAVENOUS
  Filled 2014-07-26: qty 500

## 2014-07-26 NOTE — ED Notes (Signed)
Dr Otter at bedside  

## 2014-07-26 NOTE — Progress Notes (Signed)
PHARMACIST - PHYSICIAN ORDER COMMUNICATION  CONCERNING: P&T Medication Policy on Herbal Medications  DESCRIPTION:  This patient's order for:  Benfotiamine 150mg    has been noted.  This product(s)  is a synthetic derivative of Thiamine (Vit B 1)   ACTION TAKEN: The pharmacy department is unable to provide this medication, the patient may supply for use during this admission.  Thank you,  Minda Ditto PharmD

## 2014-07-26 NOTE — ED Notes (Signed)
Patient states she does not have to urinate at this time will, call when she can go

## 2014-07-26 NOTE — ED Notes (Signed)
Pt waiting on MRI to be done before pt goes to room.

## 2014-07-26 NOTE — H&P (Signed)
[facility] History and Physical  RAILEY GLAD FAO:[id number] DOB: 1946-02-09 DOA: 07/26/2014  Referring physician: Dr Sharol Given.  PCP: Woody Seller, MD   Chief Complaint: pass out  HPI: Ellen Beltran is a 68 y.o. female with PMH significant for Breast cancer Stage II A//II B, invasive ductal carcinoma, S/P lumpectomy, S/P adjuvant radiation therapy who presents after syncope episode. Patient has been feeling weak and tired for last week. She has not being eating well. She relates productive cough since yesterday. Relates low grade fever at home. This morning her husband found her on the floor of the bathroom. She was alert at that time. He them help try to take her to bed when she pass out. She also loss controlled of her Bowel at that time.  She relates that she was feeling tired, weak , lightheaded. No chest pain no dyspnea.  She relates mild headache, specially at night for last few days.   Review of Systems:  Negative, except as per HPI.   Past Medical History  Diagnosis Date  . Hyperlipidemia   . Osteopenia   . Arthritis   . Cancer     left breast  . Neuropathy due to chemotherapeutic drug     hands and feet  . History of blood transfusion 09/24/2013  . Dental bridge present     left lower  . History of chemotherapy     stopped 08/2013   Past Surgical History  Procedure Laterality Date  . Portacath placement N/A 05/02/2013    Procedure: INSERTION PORT-A-CATH;  Surgeon: Shann Medal, MD;  Location: [facility];  Service: General;  Laterality: N/A;  . Total abdominal hysterectomy w/ bilateral salpingoophorectomy  05/06/2001  . Pelvic floor repair  05/06/2001    cardinal uterosacral colposuspension  . Cardiac catheterization  08/31/2003    no disease  . Colonoscopy with propofol  06/22/2012  . Tubal ligation  age 61  . Breast lumpectomy with needle localization and axillary sentinel lymph node bx Left 10/17/2013    Procedure: BREAST LUMPECTOMY WITH NEEDLE  LOCALIZATION AND AXILLARY SENTINEL LYMPH NODE BX;  Surgeon: Shann Medal, MD;  Location: [facility];  Service: General;  Laterality: Left;   Social History:  reports that she has never smoked. She has never used smokeless tobacco. She reports that she does not drink alcohol or use illicit drugs.  Allergies  Allergen Reactions  . Codeine Other (See Comments)    HALLUCINATIONS  . Morphine And Related Nausea Only  . Sulfa Antibiotics Rash    Family History  Problem Relation Age of Onset  . Heart disease Father   . Heart disease Brother   . Lung cancer Mother      Prior to Admission medications   Medication Sig Start Date End Date Taking? Authorizing Provider  anastrozole (ARIMIDEX) 1 MG tablet Take 1 tablet (1 mg total) by mouth daily. 05/11/14  Yes Minette Headland, NP  Biotin 5000 MCG CAPS Take by mouth.   Yes Historical Provider, MD  fluticasone (FLONASE) 50 MCG/ACT nasal spray Place 1 spray into both nostrils daily as needed. congestion 06/23/14  Yes Historical Provider, MD  gabapentin (NEURONTIN) 100 MG capsule Take 1 capsule (100 mg total) by mouth 3 (three) times daily. Take 2 capsules three times a day Patient taking differently: Take 200 mg by mouth 3 (three) times daily.  05/11/14  Yes Minette Headland, NP  Multiple Vitamins-Minerals (ALIVE WOMENS 50+ PO) Take 2 each by mouth daily. Take  2 gummies per day   Yes Historical Provider, MD  simvastatin (ZOCOR) 40 MG tablet Take 40 mg by mouth every evening.   Yes Historical Provider, MD  Benfotiamine 150 MG CAPS Take 150 mg by mouth daily.     Historical Provider, MD   Physical Exam: Filed Vitals:   07/26/14 0749 07/26/14 0800 07/26/14 0830 07/26/14 0904  BP: 100/46 98/44 96/48  94/49  Pulse: 77 74 84   Temp: 98.2 F (36.8 C)     TempSrc: Oral     Resp: 16 13 19 15   Height:      Weight:      SpO2: 93% 94% 93%     Wt Readings from Last 3 Encounters:  07/26/14 49.442 kg (109 lb)  05/11/14 49.351 kg (108  lb 12.8 oz)  03/10/14 50.803 kg (112 lb)    General:  Appears calm and comfortable Eyes: PERRL, normal lids, irises & conjunctiva ENT: grossly normal hearing, lips & tongue Neck: no LAD, masses or thyromegaly Cardiovascular: RRR, no m/r/g. No LE edema. Telemetry: SR, no arrhythmias  Respiratory: Normal respiratory effort. Decrease breath sound on the left.  Abdomen: soft, ntnd Skin: no rash or induration seen on limited exam Musculoskeletal: grossly normal tone BUE/BLE Psychiatric: grossly normal mood and affect, speech fluent and appropriate Neurologic: grossly non-focal.          Labs on Admission:  Basic Metabolic Panel:  Recent Labs Lab 07/26/14 0600  NA 134*  K 3.5*  CL 93*  CO2 26  GLUCOSE 172*  BUN 16  CREATININE 0.90  CALCIUM 10.3   Liver Function Tests:  Recent Labs Lab 07/26/14 0600  AST 43*  ALT 41*  ALKPHOS 96  BILITOT 0.6  PROT 8.1  ALBUMIN 3.1*   No results for input(s): LIPASE, AMYLASE in the last 168 hours. No results for input(s): AMMONIA in the last 168 hours. CBC:  Recent Labs Lab 07/26/14 0600  WBC 20.1*  NEUTROABS 16.9*  HGB 11.0*  HCT 32.9*  MCV 90.4  PLT 250   Cardiac Enzymes: No results for input(s): CKTOTAL, CKMB, CKMBINDEX, TROPONINI in the last 168 hours.  BNP (last 3 results) No results for input(s): PROBNP in the last 8760 hours. CBG: No results for input(s): GLUCAP in the last 168 hours.  Radiological Exams on Admission: Dg Chest 1 View  07/26/2014   CLINICAL DATA:  Syncope. Intermittent nausea and vomiting for 4 days.  EXAM: CHEST - 1 VIEW  COMPARISON:  CT chest 09/25/2013 and PA and lateral chest 09/23/2013.  FINDINGS: There is new airspace disease in the left mid and upper lung zones. The right lung is clear. Heart size is normal. Port-A-Cath seen on the prior study has been removed.  IMPRESSION: New left mid and upper lung zone airspace disease likely secondary to pneumonia. Recommend followup films to clearing.    Electronically Signed   By: Inge Rise M.D.   On: 07/26/2014 06:03    EKG: sinus rhythm.   Assessment/Plan Active Problems:   CAP (community acquired pneumonia)   PNA (pneumonia)  1-PNA; community acquired:  Patient presents with cough, leukocytosis, subjective fever. Chest x ray with left mid and upper lobe infiltrates.  Continue with ceftriaxone and Azithromycin.  Blood culture and sputum culture ordered.  Patient will need Chest x ray to document resolution.   2-Hypotension; Continue with IV fluids. Probably related to hypovolemia, but in setting of infection sepsis s in the differential. Lactic acid at 1.3. Check cortisol level.  3-Syncope; in setting of hypotension, infection. Patient also had episode of Bowel incontinence, relates headaches. Will check MRI brain due to history of breast cancer.   4=Leukoctosis; in setting of infection, PNA. Will also check UA and urine culture.   5-History of breast Cancer: Continue with home medications. Will Add Dr Lindi Adie to rounding list.   6-Hypokalemia; replete with oral potassium supplement.  7-transaminases; follow trend.      Code Status: presume Full Code.  DVT Prophylaxis: Lovenox.  Family Communication: Care discussed with Husband.  Disposition Plan: expect 3 to 4 days inpatient.   Time spent: 75 minutes.   Niel Hummer A [facility] Pager [phone]

## 2014-07-26 NOTE — ED Notes (Signed)
Pt has restricted arm and an IV in the other hand.  Fluids have been shut off so we can get labs in about 30 min.

## 2014-07-26 NOTE — ED Provider Notes (Signed)
CSN: [id number]     Arrival date & time 07/26/14  0456 History   First MD Initiated Contact with Patient 07/26/14 [phone]     Chief Complaint  Patient presents with  . Loss of Consciousness     (Consider location/radiation/quality/duration/timing/severity/associated sxs/prior Treatment) HPI 68 year old female presents to the emergency department from home after syncopal episode.  Patient has not been feeling well over the last 4-5 days.  Starting Saturday evening she was complaining of some left-sided chest pain in the area of her prior mastectomy.  She has not been eating or drinking well.  She had vomiting Monday evening through the night.  This morning, patient went to the bathroom, and husband heard her fall.  She did not lose consciousness, but was woozy and weak.  He tried to stand her up to get her back to the bathroom and she passed out while he was helping her.  Patient was incontinent of urine and loose stool.  She denies any abdominal pain.  She has had cough.  She denies any chest pain at this time.  No headache no neck stiffness no rash.  Past medical history of breast cancer.  Past surgical history of TAH with BSO, left breast surgery Past Medical History  Diagnosis Date  . Hyperlipidemia   . Osteopenia   . Arthritis   . Cancer     left breast  . Neuropathy due to chemotherapeutic drug     hands and feet  . History of blood transfusion 09/24/2013  . Dental bridge present     left lower  . History of chemotherapy     stopped 08/2013   Past Surgical History  Procedure Laterality Date  . Portacath placement N/A 05/02/2013    Procedure: INSERTION PORT-A-CATH;  Surgeon: Shann Medal, MD;  Location: [facility];  Service: General;  Laterality: N/A;  . Total abdominal hysterectomy w/ bilateral salpingoophorectomy  05/06/2001  . Pelvic floor repair  05/06/2001    cardinal uterosacral colposuspension  . Cardiac catheterization  08/31/2003    no disease  . Colonoscopy with propofol   06/22/2012  . Tubal ligation  age 16  . Breast lumpectomy with needle localization and axillary sentinel lymph node bx Left 10/17/2013    Procedure: BREAST LUMPECTOMY WITH NEEDLE LOCALIZATION AND AXILLARY SENTINEL LYMPH NODE BX;  Surgeon: Shann Medal, MD;  Location: [place];  Service: General;  Laterality: Left;   Family History  Problem Relation Age of Onset  . Heart disease Father   . Heart disease Brother   . Lung cancer Mother    History  Substance Use Topics  . Smoking status: Never Smoker   . Smokeless tobacco: Never Used  . Alcohol Use: No   OB History    No data available     Review of Systems   See History of Present Illness; otherwise all other systems are reviewed and negative  Allergies  Codeine; Morphine and related; and Sulfa antibiotics  Home Medications   Prior to Admission medications   Medication Sig Start Date End Date Taking? Authorizing Provider  anastrozole (ARIMIDEX) 1 MG tablet Take 1 tablet (1 mg total) by mouth daily. 05/11/14  Yes Minette Headland, NP  Biotin 5000 MCG CAPS Take by mouth.   Yes Historical Provider, MD  fluticasone (FLONASE) 50 MCG/ACT nasal spray Place 1 spray into both nostrils daily as needed. congestion 06/23/14  Yes Historical Provider, MD  gabapentin (NEURONTIN) 100 MG capsule Take 1 capsule (100 mg  total) by mouth 3 (three) times daily. Take 2 capsules three times a day Patient taking differently: Take 200 mg by mouth 3 (three) times daily.  05/11/14  Yes Minette Headland, NP  Multiple Vitamins-Minerals (ALIVE WOMENS 50+ PO) Take 2 each by mouth daily. Take 2 gummies per day   Yes Historical Provider, MD  simvastatin (ZOCOR) 40 MG tablet Take 40 mg by mouth every evening.   Yes Historical Provider, MD  Benfotiamine 150 MG CAPS Take 150 mg by mouth daily.     Historical Provider, MD   BP 103/78 mmHg  Pulse 102  Temp(Src) 100.6 F (38.1 C) (Oral)  Resp 20  Ht 4\' 10"  (1.473 m)  Wt 109 lb (49.442 kg)  BMI  22.79 kg/m2  SpO2 98% Physical Exam  Constitutional: She is oriented to person, place, and time. She appears well-developed and well-nourished. She appears distressed (patient is pale, ill-appearing).  HENT:  Head: Normocephalic and atraumatic.  Nose: Nose normal.  Mouth/Throat: Oropharynx is clear and moist.  Eyes: Conjunctivae and EOM are normal. Pupils are equal, round, and reactive to light.  Neck: Normal range of motion. Neck supple. No JVD present. No tracheal deviation present. No thyromegaly present.  Cardiovascular: Regular rhythm, normal heart sounds and intact distal pulses.  Exam reveals no gallop and no friction rub.   No murmur heard. Tachycardia noted  Pulmonary/Chest: Effort normal and breath sounds normal. No stridor. No respiratory distress. She has no wheezes. She has no rales. She exhibits no tenderness.  Abdominal: Soft. Bowel sounds are normal. She exhibits no distension and no mass. There is no tenderness. There is no rebound and no guarding.  Musculoskeletal: Normal range of motion. She exhibits no edema or tenderness.  Lymphadenopathy:    She has no cervical adenopathy.  Neurological: She is alert and oriented to person, place, and time. She displays normal reflexes. She exhibits normal muscle tone. Coordination normal.  Skin: Skin is warm and dry. No rash noted. No erythema. There is pallor.  Psychiatric: She has a normal mood and affect. Her behavior is normal. Judgment and thought content normal.  Nursing note and vitals reviewed.   ED Course  Procedures (including critical care time) Labs Review Labs Reviewed  COMPREHENSIVE METABOLIC PANEL - Abnormal; Notable for the following:    Sodium 134 (*)    Potassium 3.5 (*)    Chloride 93 (*)    Glucose, Bld 172 (*)    Albumin 3.1 (*)    AST 43 (*)    ALT 41 (*)    GFR calc non Af Amer 65 (*)    GFR calc Af Amer 76 (*)    All other components within normal limits  CBC WITH DIFFERENTIAL - Abnormal; Notable  for the following:    WBC 20.1 (*)    RBC 3.64 (*)    Hemoglobin 11.0 (*)    HCT 32.9 (*)    Neutrophils Relative % 84 (*)    Neutro Abs 16.9 (*)    Lymphocytes Relative 6 (*)    Monocytes Absolute 2.0 (*)    All other components within normal limits  CULTURE, BLOOD (ROUTINE X 2)  CULTURE, BLOOD (ROUTINE X 2)  URINE CULTURE  URINALYSIS, ROUTINE W REFLEX MICROSCOPIC  I-STAT CG4 LACTIC ACID, ED    Imaging Review Dg Chest 1 View  07/26/2014   CLINICAL DATA:  Syncope. Intermittent nausea and vomiting for 4 days.  EXAM: CHEST - 1 VIEW  COMPARISON:  CT chest 09/25/2013  and PA and lateral chest 09/23/2013.  FINDINGS: There is new airspace disease in the left mid and upper lung zones. The right lung is clear. Heart size is normal. Port-A-Cath seen on the prior study has been removed.  IMPRESSION: New left mid and upper lung zone airspace disease likely secondary to pneumonia. Recommend followup films to clearing.   Electronically Signed   By: Inge Rise M.D.   On: 07/26/2014 06:03     EKG Interpretation   Date/Time:  Wednesday July 26 2014 05:14:32 EST Ventricular Rate:  97 PR Interval:  115 QRS Duration: 77 QT Interval:  347 QTC Calculation: 441 R Axis:   42 Text Interpretation:  Sinus rhythm Borderline short PR interval Abnormal  inferior Q waves Confirmed by Shanie Mauzy  MD, Darenda Fike ([id number]) on 07/26/2014  5:55:19 AM      MDM   Final diagnoses:  CAP (community acquired pneumonia)  Syncope, unspecified syncope type    67 year old female with syncopal episode today, most likely due to orthostatic hypotension.  Patient has been ill recently, has low-grade temp here today.  Chest x-ray completed shows probable pneumonia on left side.  We'll start antibiotic.  7:25 AM Elevated wbc noted.  Pt still weak after 1 liter.  Will d/w hospitalist for admission.    Kalman Drape, MD 07/26/14 [phone]

## 2014-07-26 NOTE — ED Notes (Signed)
Pt back from MRI, up to restroom with 1 assist.

## 2014-07-26 NOTE — ED Notes (Signed)
X-Ray at bedside.

## 2014-07-26 NOTE — ED Notes (Signed)
Per husband pt got out of bed @ 0330, he heard her fall, attempted to get her up and witnessed syncopal episode with urinary and bowel incontinence. Pt has been ill with loss of appetite and intermittent n/v x 4 days.

## 2014-07-26 NOTE — ED Notes (Signed)
Lactic acid given to Dr. Sharol Given.

## 2014-07-26 NOTE — ED Notes (Signed)
E.D Nurse notified of twenty min to get pt to floor and floor nurse alerted to arrival in twenty mins.Marland KitchenKLJ

## 2014-07-26 NOTE — Plan of Care (Signed)
Problem: Phase I Progression Outcomes Goal: Dyspnea controlled at rest Outcome: Completed/Met Date Met:  07/26/14 Goal: Pain controlled with appropriate interventions Outcome: Completed/Met Date Met:  07/26/14 Goal: OOB as tolerated unless otherwise ordered Outcome: Completed/Met Date Met:  07/26/14 Goal: Code status addressed with pt/family Outcome: Completed/Met Date Met:  07/26/14 Goal: Voiding-avoid urinary catheter unless indicated Outcome: Completed/Met Date Met:  07/26/14 Goal: Hemodynamically stable Outcome: Completed/Met Date Met:  07/26/14  Problem: Phase II Progression Outcomes Goal: Wean O2 if indicated Outcome: Completed/Met Date Met:  07/26/14 Goal: Pain controlled Outcome: Completed/Met Date Met:  07/26/14 Goal: Progress activity as tolerated unless otherwise ordered Outcome: Progressing Goal: Other Phase II Outcomes/Goals Outcome: Not Applicable Date Met:  90/93/11

## 2014-07-27 DIAGNOSIS — Z853 Personal history of malignant neoplasm of breast: Secondary | ICD-10-CM

## 2014-07-27 DIAGNOSIS — J189 Pneumonia, unspecified organism: Secondary | ICD-10-CM

## 2014-07-27 DIAGNOSIS — A419 Sepsis, unspecified organism: Secondary | ICD-10-CM

## 2014-07-27 DIAGNOSIS — R55 Syncope and collapse: Secondary | ICD-10-CM | POA: Insufficient documentation

## 2014-07-27 DIAGNOSIS — Z170 Estrogen receptor positive status [ER+]: Secondary | ICD-10-CM

## 2014-07-27 DIAGNOSIS — R6521 Severe sepsis with septic shock: Secondary | ICD-10-CM

## 2014-07-27 LAB — URINE CULTURE
COLONY COUNT: NO GROWTH
Culture: NO GROWTH

## 2014-07-27 LAB — GLUCOSE, CAPILLARY: GLUCOSE-CAPILLARY: 93 mg/dL (ref 70–99)

## 2014-07-27 LAB — CORTISOL-AM, BLOOD: Cortisol - AM: 17.9 ug/dL (ref 4.3–22.4)

## 2014-07-27 NOTE — Progress Notes (Signed)
SUBJECTIVE: patient was admitted with pneumonia and syncope, feeling better on antibiotics. Ellen Beltran is a 68 year old Caucasian lady with a history of stage II breast cancer ER positive PR negative Ki-67 47% HER-2 negative she underwent neoadjuvant chemotherapy with weekly Taxol and carboplatin on 07/08/2013 to 09/02/2013 for what was felt to be triple negative breast cancer on initial biopsy and subsequently stopped chemotherapy due to neuropathy. She underwent lumpectomy that revealed small residual breast cancer 0.4 cm. The final pathology revealed ER positive breast cancer. She was then started on oral antiestrogen therapy with Arimidex and was tolerating it very well. Patient was recently at [facility] and was started to feel sick and at home she had an episode of syncope and her husband found her lying on the floor and called EMS. When she was admitted she was found to have a right-sided pneumonia. MRI of the brain was done which was normal. She is currently on antibiotics and slowly improving.  OBJECTIVE PHYSICAL EXAMINATION: ECOG PERFORMANCE STATUS: 2 - Symptomatic, <50% confined to bed  Filed Vitals:   07/27/14 0600  BP: 124/43  Pulse: 66  Temp:   Resp: 16   Filed Weights   07/26/14 0504 07/26/14 1140 07/27/14 0457  Weight: 109 lb (49.442 kg) 109 lb 12.6 oz (49.8 kg) 114 lb 3.2 oz (51.8 kg)    GENERAL:alert, no distress and comfortable SKIN: skin color, texture, turgor are normal, no rashes or significant lesions EYES: normal, Conjunctiva are pink and non-injected, sclera clear OROPHARYNX:no exudate, no erythema and lips, buccal mucosa, and tongue normal  NECK: supple, thyroid normal size, non-tender, without nodularity LYMPH:  no palpable lymphadenopathy in the cervical, axillary or inguinal LUNGS: diminished breath sounds at the right lung base HEART: regular rate & rhythm and no murmurs and no lower extremity edema ABDOMEN:abdomen soft, non-tender and normal bowel  sounds Musculoskeletal:no cyanosis of digits and no clubbing  NEURO: alert & oriented x 3 with fluent speech, no focal motor/sensory deficits  LABORATORY DATA:  I have reviewed the data as listed '@LASTCHEMISTRY' @  Lab Results  Component Value Date   WBC 20.1* 07/26/2014   HGB 11.0* 07/26/2014   HCT 32.9* 07/26/2014   MCV 90.4 07/26/2014   PLT 250 07/26/2014   NEUTROABS 16.9* 07/26/2014    ASSESSMENT AND PLAN:  1. Right-sided pneumonia: On antibiotic 2. Syncope: MRI of the brain is normal I suspect syncope was related to pneumonia 3. Stage II breast cancer: ER positive. Currently on antiestrogen therapy and tolerating it very well.  Patient has a followup appointment with [place] in January 2016. I instructed the patient to keep that appointment. Thank you much for your excellent care and please do not hesitate to call us with any further questions

## 2014-07-27 NOTE — Progress Notes (Signed)
Nutrition Brief Note  Patient identified on the Malnutrition Screening Tool (MST) Report  Wt Readings from Last 15 Encounters:  07/27/14 114 lb 3.2 oz (51.8 kg)  07/26/14 110 lb (49.896 kg)  05/11/14 108 lb 12.8 oz (49.351 kg)  03/10/14 112 lb (50.803 kg)  02/02/14 113 lb (51.256 kg)  01/26/14 114 lb 9.6 oz (51.982 kg)  12/29/13 112 lb 11.2 oz (51.12 kg)  12/28/13 112 lb 11.2 oz (51.12 kg)  12/13/13 113 lb 14.4 oz (51.665 kg)  12/06/13 114 lb 6.4 oz (51.891 kg)  11/29/13 113 lb (51.256 kg)  11/22/13 114 lb 3.2 oz (51.801 kg)  11/16/13 114 lb (51.71 kg)  11/15/13 113 lb (51.256 kg)  11/04/13 111 lb 1.6 oz (50.395 kg)    Body mass index is 23.87 kg/(m^2). Patient meets criteria for Normal weight based on current BMI.   Current diet order is Regular, patient is consuming approximately 50% of meals at this time. Labs and medications reviewed.   Pt reported hx of unintentional wt loss when dx with breast cancer and underwent chemotherapy. Since treatments, weight has remained stable and appetite is normal. Pt noted slight decrease in appetite d/t ongoing loose stools for past one week; however is still able to consume >50% of several meals daily. Reviewed nutrition therapy for loose stools and diarrhea, and encouraged adequate hydration.  No additional nutrition interventions warranted at this time. If nutrition issues arise, please consult RD.   Atlee Abide MS RD LDN Clinical Dietitian [phone]

## 2014-07-27 NOTE — Progress Notes (Signed)
[facility] PROGRESS NOTE Assessment/Plan: Sepsis shock/CAP (community acquired pneumonia)/leukocytosis: - Started on rocephin and azithro on 11.11.2015. - blood cultures and sputum cultures order. - hypotension responded to IV fluids.  Syncope: - in the setting of sepsis. - Lactic acid at 1.3. Check cortisol 17. - MRI pending.  History of breast Cancer:  - Continue with home medications. Will Add Dr Lindi Adie to rounding list.   Hypokalemia;  - Replete with oral potassium supplement.   Transaminases: - follow trend.     Cancer of central portion of female breast, Left.  T2, N0.  Code Status: presume Full Code.  DVT Prophylaxis: Lovenox.  Family Communication: Care discussed with Husband.  Disposition Plan: expect 3 to 4 days inpatient.    Consultants:  none  Procedures:  MRI  CXR  Antibiotics:  Rocephin and azithro (indicate start date, and stop date if known)  HPI/Subjective: SOB improved, feels better.  Objective: Filed Vitals:   07/27/14 0349 07/27/14 0400 07/27/14 0457 07/27/14 0600  BP:  111/53  124/43  Pulse:  63  66  Temp: 98.1 F (36.7 C)     TempSrc: Oral     Resp:  12  16  Height:      Weight:   51.8 kg (114 lb 3.2 oz)   SpO2:  96%  95%    Intake/Output Summary (Last 24 hours) at 07/27/14 0823 Last data filed at 07/27/14 0600  Gross per 24 hour  Intake 2180.42 ml  Output    975 ml  Net 1205.42 ml   Filed Weights   07/26/14 0504 07/26/14 1140 07/27/14 0457  Weight: 49.442 kg (109 lb) 49.8 kg (109 lb 12.6 oz) 51.8 kg (114 lb 3.2 oz)    Exam:  General: Alert, awake, oriented x3, in no acute distress.  HEENT: No bruits, no goiter.  Heart: Regular rate and rhythm. Lungs: Good air movement, clear Abdomen: Soft, nontender, nondistended, positive bowel sounds.   Data Reviewed: Basic Metabolic Panel:  Recent Labs Lab 07/26/14 0600  NA 134*  K 3.5*  CL 93*  CO2 26  GLUCOSE 172*  BUN 16  CREATININE 0.90  CALCIUM  10.3   Liver Function Tests:  Recent Labs Lab 07/26/14 0600  AST 43*  ALT 41*  ALKPHOS 96  BILITOT 0.6  PROT 8.1  ALBUMIN 3.1*   No results for input(s): LIPASE, AMYLASE in the last 168 hours. No results for input(s): AMMONIA in the last 168 hours. CBC:  Recent Labs Lab 07/26/14 0600  WBC 20.1*  NEUTROABS 16.9*  HGB 11.0*  HCT 32.9*  MCV 90.4  PLT 250   Cardiac Enzymes: No results for input(s): CKTOTAL, CKMB, CKMBINDEX, TROPONINI in the last 168 hours. BNP (last 3 results) No results for input(s): PROBNP in the last 8760 hours. CBG: No results for input(s): GLUCAP in the last 168 hours.  Recent Results (from the past 240 hour(s))  MRSA PCR Screening     Status: None   Collection Time: 07/26/14 11:52 AM  Result Value Ref Range Status   MRSA by PCR NEGATIVE NEGATIVE Final    Comment:        The GeneXpert MRSA Assay (FDA approved for NASAL specimens only), is one component of a comprehensive MRSA colonization surveillance program. It is not intended to diagnose MRSA infection nor to guide or monitor treatment for MRSA infections.      Studies: Dg Chest 1 View  07/26/2014   CLINICAL DATA:  Syncope. Intermittent nausea and vomiting for  4 days.  EXAM: CHEST - 1 VIEW  COMPARISON:  CT chest 09/25/2013 and PA and lateral chest 09/23/2013.  FINDINGS: There is new airspace disease in the left mid and upper lung zones. The right lung is clear. Heart size is normal. Port-A-Cath seen on the prior study has been removed.  IMPRESSION: New left mid and upper lung zone airspace disease likely secondary to pneumonia. Recommend followup films to clearing.   Electronically Signed   By: Inge Rise M.D.   On: 07/26/2014 06:03   Mr Jeri Cos GO Contrast  07/26/2014   CLINICAL DATA:  Syncopal episodes. Fever and vomiting. History of breast cancer status post lumpectomy and chemoradiation.  EXAM: MRI HEAD WITHOUT AND WITH CONTRAST  TECHNIQUE: Multiplanar, multiecho pulse  sequences of the brain and surrounding structures were obtained without and with intravenous contrast.  CONTRAST:  61mL MULTIHANCE GADOBENATE DIMEGLUMINE 529 MG/ML IV SOLN  COMPARISON:  Maxillofacial CT 09/05/2013  FINDINGS: There is no evidence of acute infarct, intracranial hemorrhage, mass, midline shift, or extra-axial fluid collection. There is mild, age-appropriate cerebral atrophy. Mild T2 hyperintensities in the periventricular white matter are nonspecific but may reflect minimal chronic small vessel ischemic disease and/or posttherapy changes. There is no abnormal enhancement.  Orbits are unremarkable. Paranasal sinuses and mastoid air cells are clear. Major intracranial vascular flow voids are preserved.  IMPRESSION: No evidence of acute intracranial abnormality or intracranial metastases.   Electronically Signed   By: Logan Bores   On: 07/26/2014 11:43    Scheduled Meds: . anastrozole  1 mg Oral Q24H  . azithromycin  500 mg Intravenous Q24H  . cefTRIAXone (ROCEPHIN)  IV  1 g Intravenous Q24H  . enoxaparin (LOVENOX) injection  40 mg Subcutaneous Q24H  . gabapentin  200 mg Oral TID  . guaiFENesin  600 mg Oral BID  . simvastatin  40 mg Oral QPM   Continuous Infusions: . sodium chloride 125 mL/hr at 07/27/14 0130     Charlynne Cousins  [facility] Pager [phone]. If 8PM-8AM, please contact night-coverage at [email or web address], password [facility] 07/27/2014, 8:23 AM  LOS: 1 day

## 2014-07-27 NOTE — Care Management Note (Signed)
    Page 1 of 2   07/27/2014     1:50:27 PM CARE MANAGEMENT NOTE 07/27/2014  Patient:  AMBERT, VIRRUETA   Account Number:  [id number]  Date Initiated:  07/27/2014  Documentation initiated by:  Alexza Norbeck  Subjective/Objective Assessment:   pna and sepsis     Action/Plan:   home when stable   Anticipated DC Date:  07/30/2014   Anticipated DC Plan:  HOME/SELF CARE  In-house referral  NA      DC Planning Services  CM consult      PAC Choice  NA   Choice offered to / List presented to:  NA   DME arranged  NA      DME agency  NA     [place] arranged  NA      [place] agency  NA   Status of service:  In process, will continue to follow Medicare Important Message given?   (If response is "NO", the following Medicare IM given date fields will be blank) Date Medicare IM given:   Medicare IM given by:   Date Additional Medicare IM given:   Additional Medicare IM given by:    Discharge Disposition:    Per UR Regulation:  Reviewed for med. necessity/level of care/duration of stay  If discussed at [place] of Stay Meetings, dates discussed:    Comments:  07/27/2014/Art[place]ia Berkey L. Rosana Hoes, RN, BSN, CCM: Chart review for medical necessity and patient discharge needs. Case Manager will follow for patient condition changes.

## 2014-07-28 DIAGNOSIS — D72829 Elevated white blood cell count, unspecified: Secondary | ICD-10-CM

## 2014-07-28 LAB — HEPATIC FUNCTION PANEL
ALT: 328 U/L — ABNORMAL HIGH (ref 0–35)
AST: 296 U/L — ABNORMAL HIGH (ref 0–37)
Albumin: 2.9 g/dL — ABNORMAL LOW (ref 3.5–5.2)
Alkaline Phosphatase: 109 U/L (ref 39–117)
Bilirubin, Direct: <0.2 mg/dL (ref 0.0–0.3)
TOTAL PROTEIN: 7.1 g/dL (ref 6.0–8.3)
Total Bilirubin: 0.2 mg/dL — ABNORMAL LOW (ref 0.3–1.2)

## 2014-07-28 LAB — CLOSTRIDIUM DIFFICILE BY PCR: Toxigenic C. Difficile by PCR: NEGATIVE

## 2014-07-28 MED ORDER — LEVOFLOXACIN 750 MG PO TABS
750.0000 mg | ORAL_TABLET | Freq: Every day | ORAL | Status: DC
  Administered 2014-07-28 – 2014-07-29 (×2): 750 mg via ORAL
  Filled 2014-07-28 (×2): qty 1

## 2014-07-28 NOTE — Progress Notes (Signed)
[facility] PROGRESS NOTE Assessment/Plan: Sepsis shock/CAP (community acquired pneumonia)/leukocytosis: - Started on rocephin and azithro on 11.11.2015. De-escalate to levaquin. - blood cultures and sputum cultures cont to be negative. - hypotension responded to IV fluids.  Syncope: - in the setting of sepsis. - Lactic acid at 1.3. Check cortisol 17. - MRI no CVA  History of breast Cancer:  - Continue with home medications. Will Add Dr Lindi Adie to rounding list.   Hypokalemia;  - Replete with oral potassium supplement.   Transaminases: - follow trend.     Cancer of central portion of female breast, Left.  T2, N0.  Code Status: presume Full Code.  DVT Prophylaxis: Lovenox.  Family Communication: Care discussed with Husband.  Disposition Plan: expect 3 to 4 days inpatient.    Consultants:  none  Procedures:  MRI  CXR  Antibiotics:  Rocephin and azithro (indicate start date, and stop date if known)  HPI/Subjective: No complains  Objective: Filed Vitals:   07/28/14 0200 07/28/14 0340 07/28/14 0400 07/28/14 0600  BP: 104/49  121/55 121/56  Pulse: 69  74 69  Temp:  98.5 F (36.9 C)    TempSrc:  Oral    Resp: 20  12 16   Height:      Weight:      SpO2: 96%  97% 97%    Intake/Output Summary (Last 24 hours) at 07/28/14 0816 Last data filed at 07/28/14 0500  Gross per 24 hour  Intake 546.41 ml  Output      0 ml  Net 546.41 ml   Filed Weights   07/26/14 0504 07/26/14 1140 07/27/14 0457  Weight: 49.442 kg (109 lb) 49.8 kg (109 lb 12.6 oz) 51.8 kg (114 lb 3.2 oz)    Exam:  General: Alert, awake, oriented x3, in no acute distress.  HEENT: No bruits, no goiter.  Heart: Regular rate and rhythm. Lungs: Good air movement, clear Abdomen: Soft, nontender, nondistended, positive bowel sounds.   Data Reviewed: Basic Metabolic Panel:  Recent Labs Lab 07/26/14 0600  NA 134*  K 3.5*  CL 93*  CO2 26  GLUCOSE 172*  BUN 16  CREATININE 0.90    CALCIUM 10.3   Liver Function Tests:  Recent Labs Lab 07/26/14 0600  AST 43*  ALT 41*  ALKPHOS 96  BILITOT 0.6  PROT 8.1  ALBUMIN 3.1*   No results for input(s): LIPASE, AMYLASE in the last 168 hours. No results for input(s): AMMONIA in the last 168 hours. CBC:  Recent Labs Lab 07/26/14 0600  WBC 20.1*  NEUTROABS 16.9*  HGB 11.0*  HCT 32.9*  MCV 90.4  PLT 250   Cardiac Enzymes: No results for input(s): CKTOTAL, CKMB, CKMBINDEX, TROPONINI in the last 168 hours. BNP (last 3 results) No results for input(s): PROBNP in the last 8760 hours. CBG:  Recent Labs Lab 07/27/14 0857  GLUCAP 93    Recent Results (from the past 240 hour(s))  Culture, blood (routine x 2)     Status: None (Preliminary result)   Collection Time: 07/26/14  6:00 AM  Result Value Ref Range Status   Specimen Description BLOOD RIGHT ANTECUBITAL  Final   Special Requests BOTTLES DRAWN AEROBIC AND ANAEROBIC 5CC  Final   Culture  Setup Time   Final    07/26/2014 12:17 Performed at [facility]    Culture   Final           BLOOD CULTURE RECEIVED NO GROWTH TO DATE CULTURE WILL BE HELD FOR 5  DAYS BEFORE ISSUING A FINAL NEGATIVE REPORT Performed at [facility]    Report Status PENDING  Incomplete  Culture, blood (routine x 2)     Status: None (Preliminary result)   Collection Time: 08/09/2014  6:15 AM  Result Value Ref Range Status   Specimen Description BLOOD RIGHT WRIST  Final   Special Requests BOTTLES DRAWN AEROBIC AND ANAEROBIC 3CC  Final   Culture  Setup Time   Final    08/09/14 12:17 Performed at [facility]    Culture   Final           BLOOD CULTURE RECEIVED NO GROWTH TO DATE CULTURE WILL BE HELD FOR 5 DAYS BEFORE ISSUING A FINAL NEGATIVE REPORT Performed at [facility]    Report Status PENDING  Incomplete  Urine culture     Status: None   Collection Time: Aug 09, 2014  9:31 AM  Result Value Ref Range Status   Specimen Description URINE, CLEAN CATCH   Final   Special Requests NONE  Final   Culture  Setup Time   Final    08-09-14 15:10 Performed at [place] Performed at [facility]   Final   Culture NO GROWTH Performed at [facility]   Final   Report Status 07/27/2014 FINAL  Final  MRSA PCR Screening     Status: None   Collection Time: 09-Aug-2014 11:52 AM  Result Value Ref Range Status   MRSA by PCR NEGATIVE NEGATIVE Final    Comment:        The GeneXpert MRSA Assay (FDA approved for NASAL specimens only), is one component of a comprehensive MRSA colonization surveillance program. It is not intended to diagnose MRSA infection nor to guide or monitor treatment for MRSA infections.      Studies: Mr Kizzie Fantasia Contrast  08-09-14   CLINICAL DATA:  Syncopal episodes. Fever and vomiting. History of breast cancer status post lumpectomy and chemoradiation.  EXAM: MRI HEAD WITHOUT AND WITH CONTRAST  TECHNIQUE: Multiplanar, multiecho pulse sequences of the brain and surrounding structures were obtained without and with intravenous contrast.  CONTRAST:  76mL MULTIHANCE GADOBENATE DIMEGLUMINE 529 MG/ML IV SOLN  COMPARISON:  Maxillofacial CT 09/05/2013  FINDINGS: There is no evidence of acute infarct, intracranial hemorrhage, mass, midline shift, or extra-axial fluid collection. There is mild, age-appropriate cerebral atrophy. Mild T2 hyperintensities in the periventricular white matter are nonspecific but may reflect minimal chronic small vessel ischemic disease and/or posttherapy changes. There is no abnormal enhancement.  Orbits are unremarkable. Paranasal sinuses and mastoid air cells are clear. Major intracranial vascular flow voids are preserved.  IMPRESSION: No evidence of acute intracranial abnormality or intracranial metastases.   Electronically Signed   By: Logan Bores   On: 08/09/2014 11:43    Scheduled Meds: . anastrozole  1 mg Oral Q24H  . azithromycin  500 mg  Intravenous Q24H  . cefTRIAXone (ROCEPHIN)  IV  1 g Intravenous Q24H  . enoxaparin (LOVENOX) injection  40 mg Subcutaneous Q24H  . gabapentin  200 mg Oral TID  . guaiFENesin  600 mg Oral BID  . simvastatin  40 mg Oral QPM   Continuous Infusions: . sodium chloride 10 mL/hr at 07/27/14 [place], ABRAHAM  [facility] Pager [phone]. If 8PM-8AM, please contact night-coverage at [email or web address], password [facility] 07/28/2014, 8:16 AM  LOS: 2 days

## 2014-07-28 NOTE — Plan of Care (Signed)
Problem: Phase I Progression Outcomes Goal: First antibiotic given within 6hrs of admit Outcome: Completed/Met Date Met:  07/28/14 Goal: Confirm chest x-ray completed Outcome: Completed/Met Date Met:  07/28/14 Goal: Consider Infectious Disease Consult Outcome: Not Applicable Date Met:  45/91/36 Goal: Consider pulmonary consult Outcome: Not Applicable Date Met:  85/99/23 Goal: Initial discharge plan identified Outcome: Progressing

## 2014-07-29 LAB — CBC
HCT: 31.6 % — ABNORMAL LOW (ref 36.0–46.0)
Hemoglobin: 10.4 g/dL — ABNORMAL LOW (ref 12.0–15.0)
MCH: 29.8 pg (ref 26.0–34.0)
MCHC: 32.9 g/dL (ref 30.0–36.0)
MCV: 90.5 fL (ref 78.0–100.0)
PLATELETS: 317 10*3/uL (ref 150–400)
RBC: 3.49 MIL/uL — ABNORMAL LOW (ref 3.87–5.11)
RDW: 13.5 % (ref 11.5–15.5)
WBC: 8.4 10*3/uL (ref 4.0–10.5)

## 2014-07-29 MED ORDER — LEVOFLOXACIN 750 MG PO TABS: 750.0000 mg | ORAL_TABLET | Freq: Every day | ORAL | Status: DC

## 2014-07-29 NOTE — Discharge Summary (Signed)
Physician Discharge Summary  Ellen Beltran:[id number] DOB: 06-07-1946 DOA: 07/26/2014  PCP: Woody Seller, MD  Admit date: 07/26/2014 Discharge date: 07/29/2014  Time spent: 35 minutes  Recommendations for Outpatient Follow-up:  1. Follow up with PCP  Discharge Diagnoses:  Principal Problem:   Septic shock Active Problems:   Cancer of central portion of female breast, Left.  T2, N0.   CAP (community acquired pneumonia)   PNA (pneumonia)   Syncope   Faintness   Discharge Condition: stable  Diet recommendation: regular  Filed Weights   07/27/14 0457 07/28/14 1058 07/29/14 0645  Weight: 51.8 kg (114 lb 3.2 oz) 48.8 kg (107 lb 9.4 oz) 48.1 kg (106 lb 0.7 oz)    History of present illness:  68 y.o. female with PMH significant for Breast cancer Stage II A//II B, invasive ductal carcinoma, S/P lumpectomy, S/P adjuvant radiation therapy who presents after syncope episode. Patient has been feeling weak and tired for last week. She has not being eating well. She relates productive cough since yesterday. Relates low grade fever at home. This morning her husband found her on the floor of the bathroom. She was alert at that time. He them help try to take her to bed when she pass out. She also loss controlled of her Bowel at that time.   Hospital Course:  Sepsis shock/CAP (community acquired pneumonia)/leukocytosis: - Started on rocephin and azithro on 11.11.2015. De-escalate to levaquin once she defervece - Blood cultures and sputum cultures cont to be negative. - Hypotension responded to IV fluids.  Syncope: - In the setting of sepsis. - Lactic acid at 1.3. Check cortisol 17. - MRI no CVA  History of breast Cancer:  - Continue with home medications.  - follow up with oncology.   Hypokalemia;  - Replete with oral potassium supplement.   Transaminases: - follow trend.   Procedures:  CXR  Consultations:  none  Discharge Exam: Filed Vitals:   07/29/14  0645  BP: 100/56  Pulse: 69  Temp: 98.2 F (36.8 C)  Resp: 16    General: A&O x3 Cardiovascular: RRR Respiratory: good air movement CTA B/L  Discharge Instructions You were cared for by a hospitalist during your hospital stay. If you have any questions about your discharge medications or the care you received while you were in the hospital after you are discharged, you can call the unit and asked to speak with the hospitalist on call if the hospitalist that took care of you is not available. Once you are discharged, your primary care physician will handle any further medical issues. Please note that NO REFILLS for any discharge medications will be authorized once you are discharged, as it is imperative that you return to your primary care physician (or establish a relationship with a primary care physician if you do not have one) for your aftercare needs so that they can reassess your need for medications and monitor your lab values.  Discharge Instructions    Diet - low sodium heart healthy    Complete by:  As directed      Increase activity slowly    Complete by:  As directed           Current Discharge Medication List    START taking these medications   Details  levofloxacin (LEVAQUIN) 750 MG tablet Take 1 tablet (750 mg total) by mouth daily. Qty: 4 tablet, Refills: 0      CONTINUE these medications which have NOT CHANGED   Details  anastrozole (ARIMIDEX) 1 MG tablet Take 1 tablet (1 mg total) by mouth daily. Qty: 30 tablet, Refills: 3   Associated Diagnoses: Cancer of central portion of female breast, unspecified laterality    Biotin 5000 MCG CAPS Take by mouth.    fluticasone (FLONASE) 50 MCG/ACT nasal spray Place 1 spray into both nostrils daily as needed. congestion Refills: 2    gabapentin (NEURONTIN) 100 MG capsule Take 1 capsule (100 mg total) by mouth 3 (three) times daily. Take 2 capsules three times a day Qty: 270 capsule, Refills: 1    Multiple  Vitamins-Minerals (ALIVE WOMENS 50+ PO) Take 2 each by mouth daily. Take 2 gummies per day    simvastatin (ZOCOR) 40 MG tablet Take 40 mg by mouth every evening.    Benfotiamine 150 MG CAPS Take 150 mg by mouth daily.        Allergies  Allergen Reactions  . Codeine Other (See Comments)    HALLUCINATIONS  . Morphine And Related Nausea Only  . Sulfa Antibiotics Rash   Follow-up Information    Follow up with Woody Seller, MD In 2 weeks.   Specialty:  Family Medicine   Why:  hospital follow up   Contact information:   [address] [place] [address] [phone]        The results of significant diagnostics from this hospitalization (including imaging, microbiology, ancillary and laboratory) are listed below for reference.    Significant Diagnostic Studies: Dg Chest 1 View  07/26/2014   CLINICAL DATA:  Syncope. Intermittent nausea and vomiting for 4 days.  EXAM: CHEST - 1 VIEW  COMPARISON:  CT chest 09/25/2013 and PA and lateral chest 09/23/2013.  FINDINGS: There is new airspace disease in the left mid and upper lung zones. The right lung is clear. Heart size is normal. Port-A-Cath seen on the prior study has been removed.  IMPRESSION: New left mid and upper lung zone airspace disease likely secondary to pneumonia. Recommend followup films to clearing.   Electronically Signed   By: Inge Rise M.D.   On: 07/26/2014 06:03   Mr Jeri Cos HY Contrast  07/26/2014   CLINICAL DATA:  Syncopal episodes. Fever and vomiting. History of breast cancer status post lumpectomy and chemoradiation.  EXAM: MRI HEAD WITHOUT AND WITH CONTRAST  TECHNIQUE: Multiplanar, multiecho pulse sequences of the brain and surrounding structures were obtained without and with intravenous contrast.  CONTRAST:  48mL MULTIHANCE GADOBENATE DIMEGLUMINE 529 MG/ML IV SOLN  COMPARISON:  Maxillofacial CT 09/05/2013  FINDINGS: There is no evidence of acute infarct, intracranial hemorrhage, mass, midline shift, or  extra-axial fluid collection. There is mild, age-appropriate cerebral atrophy. Mild T2 hyperintensities in the periventricular white matter are nonspecific but may reflect minimal chronic small vessel ischemic disease and/or posttherapy changes. There is no abnormal enhancement.  Orbits are unremarkable. Paranasal sinuses and mastoid air cells are clear. Major intracranial vascular flow voids are preserved.  IMPRESSION: No evidence of acute intracranial abnormality or intracranial metastases.   Electronically Signed   By: Logan Bores   On: 07/26/2014 11:43   Mm Diag Breast Tomo Bilateral  07/11/2014   CLINICAL DATA:  68 year old female for annual bilateral mammograms. History of left breast cancer and lumpectomy in 2015.  EXAM: DIGITAL DIAGNOSTIC BILATERAL MAMMOGRAM WITH 3D TOMOSYNTHESIS AND CAD  COMPARISON:  04/15/2013 and prior mammograms.  ACR Breast Density Category b: There are scattered areas of fibroglandular density.  FINDINGS: A magnification view of the lumpectomy site and routine views  of both breasts demonstrate left breast scarring.  No suspicious mass, nonsurgical distortion or worrisome calcifications are identified.  Mammographic images were processed with CAD.  IMPRESSION: No evidence of breast malignancy.  Left breast scarring.  RECOMMENDATION: Bilateral diagnostic mammograms in 1 year.  I have discussed the findings and recommendations with the patient. Results were also provided in writing at the conclusion of the visit. If applicable, a reminder letter will be sent to the patient regarding the next appointment.  BI-RADS CATEGORY  2: Benign.   Electronically Signed   By: Hassan Rowan M.D.   On: 07/11/2014 10:20    Microbiology: Recent Results (from the past 240 hour(s))  Culture, blood (routine x 2)     Status: None (Preliminary result)   Collection Time: 07/26/14  6:00 AM  Result Value Ref Range Status   Specimen Description BLOOD RIGHT ANTECUBITAL  Final   Special Requests BOTTLES  DRAWN AEROBIC AND ANAEROBIC 5CC  Final   Culture  Setup Time   Final    07/26/2014 12:17 Performed at [facility]    Culture   Final           BLOOD CULTURE RECEIVED NO GROWTH TO DATE CULTURE WILL BE HELD FOR 5 DAYS BEFORE ISSUING A FINAL NEGATIVE REPORT Performed at [facility]    Report Status PENDING  Incomplete  Culture, blood (routine x 2)     Status: None (Preliminary result)   Collection Time: 07/26/14  6:15 AM  Result Value Ref Range Status   Specimen Description BLOOD RIGHT WRIST  Final   Special Requests BOTTLES DRAWN AEROBIC AND ANAEROBIC 3CC  Final   Culture  Setup Time   Final    07/26/2014 12:17 Performed at [facility]    Culture   Final           BLOOD CULTURE RECEIVED NO GROWTH TO DATE CULTURE WILL BE HELD FOR 5 DAYS BEFORE ISSUING A FINAL NEGATIVE REPORT Performed at [facility]    Report Status PENDING  Incomplete  Urine culture     Status: None   Collection Time: 07/26/14  9:31 AM  Result Value Ref Range Status   Specimen Description URINE, CLEAN CATCH  Final   Special Requests NONE  Final   Culture  Setup Time   Final    07/26/2014 15:10 Performed at [place] Performed at [facility]   Final   Culture NO GROWTH Performed at [facility]   Final   Report Status 07/27/2014 FINAL  Final  MRSA PCR Screening     Status: None   Collection Time: 07/26/14 11:52 AM  Result Value Ref Range Status   MRSA by PCR NEGATIVE NEGATIVE Final    Comment:        The GeneXpert MRSA Assay (FDA approved for NASAL specimens only), is one component of a comprehensive MRSA colonization surveillance program. It is not intended to diagnose MRSA infection nor to guide or monitor treatment for MRSA infections.   Clostridium Difficile by PCR     Status: None   Collection Time: 07/27/14  6:31 PM  Result Value Ref Range Status   C difficile by pcr NEGATIVE NEGATIVE Final     Comment: Performed at [place]: Basic Metabolic Panel:  Recent Labs Lab 07/26/14 0600  NA 134*  K 3.5*  CL 93*  CO2 26  GLUCOSE 172*  BUN 16  CREATININE 0.90  CALCIUM 10.3   Liver Function Tests:  Recent Labs Lab 07/26/14 0600 07/28/14 0915  AST 43* 296*  ALT 41* 328*  ALKPHOS 96 109  BILITOT 0.6 0.2*  PROT 8.1 7.1  ALBUMIN 3.1* 2.9*   No results for input(s): LIPASE, AMYLASE in the last 168 hours. No results for input(s): AMMONIA in the last 168 hours. CBC:  Recent Labs Lab 07/26/14 0600 07/29/14 0408  WBC 20.1* 8.4  NEUTROABS 16.9*  --   HGB 11.0* 10.4*  HCT 32.9* 31.6*  MCV 90.4 90.5  PLT 250 317   Cardiac Enzymes: No results for input(s): CKTOTAL, CKMB, CKMBINDEX, TROPONINI in the last 168 hours. BNP: BNP (last 3 results) No results for input(s): PROBNP in the last 8760 hours. CBG:  Recent Labs Lab 07/27/14 0857  GLUCAP 93       Signed:  Charlynne Cousins  [facility] 07/29/2014, 9:31 AM

## 2014-07-29 NOTE — Progress Notes (Signed)
Patient discharged home, all discharge medications and instructions reviewed and questions answered. Patient to be assisted to vehicle by wheelchair.  

## 2014-08-01 LAB — CULTURE, BLOOD (ROUTINE X 2)
CULTURE: NO GROWTH
Culture: NO GROWTH

## 2014-08-22 ENCOUNTER — Other Ambulatory Visit: Payer: Self-pay | Admitting: Adult Health

## 2014-08-31 ENCOUNTER — Telehealth: Payer: Self-pay

## 2014-08-31 NOTE — Telephone Encounter (Signed)
Returned pt call - pt reports yellow fluid leakage from breast for last 2-3 weeks.  Was intermittent and sparse at first.  Has not increased to where she has to put tissues in her bra to absorb the fluid.  Surgery was 10 months ago.  Per Dr Lindi Adie, pt needs to see surgeon.  Pt voiced understanding.

## 2014-09-14 ENCOUNTER — Other Ambulatory Visit: Payer: Medicare Other

## 2014-09-14 ENCOUNTER — Ambulatory Visit: Payer: Medicare Other | Admitting: Adult Health

## 2014-09-14 ENCOUNTER — Ambulatory Visit: Payer: Medicare Other | Admitting: Hematology and Oncology

## 2014-09-22 ENCOUNTER — Ambulatory Visit (HOSPITAL_BASED_OUTPATIENT_CLINIC_OR_DEPARTMENT_OTHER): Payer: BLUE CROSS/BLUE SHIELD | Admitting: Hematology and Oncology

## 2014-09-22 ENCOUNTER — Telehealth: Payer: Self-pay | Admitting: Hematology and Oncology

## 2014-09-22 ENCOUNTER — Other Ambulatory Visit (HOSPITAL_BASED_OUTPATIENT_CLINIC_OR_DEPARTMENT_OTHER): Payer: BLUE CROSS/BLUE SHIELD

## 2014-09-22 VITALS — BP 152/64 | HR 68 | Temp 98.2°F | Resp 18 | Ht <= 58 in | Wt 107.9 lb

## 2014-09-22 DIAGNOSIS — N951 Menopausal and female climacteric states: Secondary | ICD-10-CM

## 2014-09-22 DIAGNOSIS — C50112 Malignant neoplasm of central portion of left female breast: Secondary | ICD-10-CM

## 2014-09-22 DIAGNOSIS — Z171 Estrogen receptor negative status [ER-]: Secondary | ICD-10-CM

## 2014-09-22 DIAGNOSIS — C50812 Malignant neoplasm of overlapping sites of left female breast: Secondary | ICD-10-CM

## 2014-09-22 DIAGNOSIS — M8580 Other specified disorders of bone density and structure, unspecified site: Secondary | ICD-10-CM

## 2014-09-22 DIAGNOSIS — E559 Vitamin D deficiency, unspecified: Secondary | ICD-10-CM

## 2014-09-22 LAB — CBC WITH DIFFERENTIAL/PLATELET
BASO%: 0.6 % (ref 0.0–2.0)
BASOS ABS: 0.1 10*3/uL (ref 0.0–0.1)
EOS%: 2.4 % (ref 0.0–7.0)
Eosinophils Absolute: 0.2 10*3/uL (ref 0.0–0.5)
HCT: 37.5 % (ref 34.8–46.6)
HEMOGLOBIN: 12.2 g/dL (ref 11.6–15.9)
LYMPH#: 2.8 10*3/uL (ref 0.9–3.3)
LYMPH%: 35.3 % (ref 14.0–49.7)
MCH: 29.8 pg (ref 25.1–34.0)
MCHC: 32.5 g/dL (ref 31.5–36.0)
MCV: 91.5 fL (ref 79.5–101.0)
MONO#: 0.8 10*3/uL (ref 0.1–0.9)
MONO%: 9.5 % (ref 0.0–14.0)
NEUT#: 4.1 10*3/uL (ref 1.5–6.5)
NEUT%: 52.2 % (ref 38.4–76.8)
Platelets: 302 10*3/uL (ref 145–400)
RBC: 4.1 10*6/uL (ref 3.70–5.45)
RDW: 13.9 % (ref 11.2–14.5)
WBC: 7.9 10*3/uL (ref 3.9–10.3)

## 2014-09-22 LAB — COMPREHENSIVE METABOLIC PANEL (CC13)
ALT: 27 U/L (ref 0–55)
AST: 25 U/L (ref 5–34)
Albumin: 4 g/dL (ref 3.5–5.0)
Alkaline Phosphatase: 87 U/L (ref 40–150)
Anion Gap: 9 mEq/L (ref 3–11)
BUN: 15.5 mg/dL (ref 7.0–26.0)
CALCIUM: 10.2 mg/dL (ref 8.4–10.4)
CHLORIDE: 103 meq/L (ref 98–109)
CO2: 30 mEq/L — ABNORMAL HIGH (ref 22–29)
Creatinine: 0.8 mg/dL (ref 0.6–1.1)
EGFR: 72 mL/min/{1.73_m2} — ABNORMAL LOW (ref >90)
Glucose: 91 mg/dl (ref 70–140)
POTASSIUM: 4.1 meq/L (ref 3.5–5.1)
Sodium: 142 mEq/L (ref 136–145)
Total Bilirubin: 0.35 mg/dL (ref 0.20–1.20)
Total Protein: 7.2 g/dL (ref 6.4–8.3)

## 2014-09-22 LAB — VITAMIN D 25 HYDROXY (VIT D DEFICIENCY, FRACTURES): Vit D, 25-Hydroxy: 25 ng/mL — ABNORMAL LOW (ref 30–100)

## 2014-09-22 NOTE — Progress Notes (Signed)
Patient Care Team: Christain Sacramento, MD as PCP - General (Family Medicine) Maisie Fus, MD as Consulting Physician (Obstetrics and Gynecology) Deatra Robinson, MD as Consulting Physician (Oncology) Thea Silversmith, MD as Consulting Physician (Radiation Oncology)  DIAGNOSIS: Cancer of central portion of left female breast   Staging form: Breast, AJCC 7th Edition     Clinical: Stage IIA (T2, N0, cM0) - Unsigned       Staging comments: Staged at breast conference 04/27/13      Pathologic: No stage assigned - Unsigned   SUMMARY OF ONCOLOGIC HISTORY:   Cancer of central portion of left female breast   04/15/2013 Initial Diagnosis Left breast biopsy invasive ductal carcinoma with papillary features intermediate to high-grade, ER negative PR negative HER-2 negative Ki-67 47%   04/25/2013 Breast MRI Left breast 12:00 position 2.4 cm area of abnormality, no lymph nodes   05/13/2013 - 09/02/2013 Neo-Adjuvant Chemotherapy Adriamycin and Cytoxan 4 cycles followed by Taxol and carboplatin completed 9 out of 12 stopped due to Neuropathy   10/17/2013 Surgery Left breast lumpectomy: 0.4 cm residual invasive ductal carcinoma 1 sentinel lymph node negative ER 52%, ER 0%, HER-2 negative ratio 1.18   11/14/2013 - 12/30/2013 Radiation Therapy Adjuvant radiation therapy   01/20/2014 -  Anti-estrogen oral therapy Arimidex 1 mg daily plan is for 5 years    CHIEF COMPLIANT: Follow-up of breast cancer  INTERVAL HISTORY: Ellen Beltran is a 69 year old lady with above-mentioned surgery left breast cancer. With lumpectomy after neoadjuvant chemotherapy and radiation therapy. She is currently on Arimidex and tolerating it fairly well except for occasional hot flashes. Denies any new problems or concerns currently.  REVIEW OF SYSTEMS:   Constitutional: Denies fevers, chills or abnormal weight loss Eyes: Denies blurriness of vision Ears, nose, mouth, throat, and face: Denies mucositis or sore throat Respiratory: Denies cough,  dyspnea or wheezes Cardiovascular: Denies palpitation, chest discomfort or lower extremity swelling Gastrointestinal:  Denies nausea, heartburn or change in bowel habits Skin: Denies abnormal skin rashes Lymphatics: Denies new lymphadenopathy or easy bruising Neurological:Denies numbness, tingling or new weaknesses Behavioral/Psych: Mood is stable, no new changes  Breast: Recent problems with nipple discharge for which she had seen her surgeon who had performed an ultrasound determined that there was nothing serious going on. She had a mammogram in October 2015 which was normal. All other systems were reviewed with the patient and are negative.  I have reviewed the past medical history, past surgical history, social history and family history with the patient and they are unchanged from previous note.  ALLERGIES:  is allergic to codeine; morphine and related; and sulfa antibiotics.  MEDICATIONS:  Current Outpatient Prescriptions  Medication Sig Dispense Refill  . anastrozole (ARIMIDEX) 1 MG tablet Take 1 tablet (1 mg total) by mouth daily. 30 tablet 3  . Benfotiamine 150 MG CAPS Take 150 mg by mouth daily.     . Biotin 5000 MCG CAPS Take by mouth.    . fluticasone (FLONASE) 50 MCG/ACT nasal spray Place 1 spray into both nostrils daily as needed. congestion  2  . gabapentin (NEURONTIN) 100 MG capsule TAKE 1-2 CAPSULES BY MOUTH 3 TIMES A DAY (Patient taking differently: take 2 capsules 3 times a day) 270 capsule 1  . Multiple Vitamins-Minerals (ALIVE WOMENS 50+ PO) Take 1 each by mouth daily.     . simvastatin (ZOCOR) 40 MG tablet Take 40 mg by mouth every evening.     No current facility-administered medications for this visit.  PHYSICAL EXAMINATION: ECOG PERFORMANCE STATUS: 0 - Asymptomatic  Filed Vitals:   09/22/14 0914  BP: 152/64  Pulse: 68  Temp: 98.2 F (36.8 C)  Resp: 18   Filed Weights   09/22/14 0914  Weight: 107 lb 14.4 oz (48.943 kg)    GENERAL:alert, no  distress and comfortable SKIN: skin color, texture, turgor are normal, no rashes or significant lesions EYES: normal, Conjunctiva are pink and non-injected, sclera clear OROPHARYNX:no exudate, no erythema and lips, buccal mucosa, and tongue normal  NECK: supple, thyroid normal size, non-tender, without nodularity LYMPH:  no palpable lymphadenopathy in the cervical, axillary or inguinal LUNGS: clear to auscultation and percussion with normal breathing effort HEART: regular rate & rhythm and no murmurs and no lower extremity edema ABDOMEN:abdomen soft, non-tender and normal bowel sounds Musculoskeletal:no cyanosis of digits and no clubbing  NEURO: alert & oriented x 3 with fluent speech, no focal motor/sensory deficits  LABORATORY DATA:  I have reviewed the data as listed   Chemistry      Component Value Date/Time   NA 142 09/22/2014 0838   NA 134* 07/26/2014 0600   K 4.1 09/22/2014 0838   K 3.5* 07/26/2014 0600   CL 93* 07/26/2014 0600   CO2 30* 09/22/2014 0838   CO2 26 07/26/2014 0600   BUN 15.5 09/22/2014 0838   BUN 16 07/26/2014 0600   CREATININE 0.8 09/22/2014 0838   CREATININE 0.90 07/26/2014 0600      Component Value Date/Time   CALCIUM 10.2 09/22/2014 0838   CALCIUM 10.3 07/26/2014 0600   ALKPHOS 87 09/22/2014 0838   ALKPHOS 109 07/28/2014 0915   AST 25 09/22/2014 0838   AST 296* 07/28/2014 0915   ALT 27 09/22/2014 0838   ALT 328* 07/28/2014 0915   BILITOT 0.35 09/22/2014 0838   BILITOT 0.2* 07/28/2014 0915       Lab Results  Component Value Date   WBC 7.9 09/22/2014   HGB 12.2 09/22/2014   HCT 37.5 09/22/2014   MCV 91.5 09/22/2014   PLT 302 09/22/2014   NEUTROABS 4.1 09/22/2014     RADIOGRAPHIC STUDIES: I have personally reviewed the radiology reports and agreed with their findings. Mammogram 07/11/2014 is normal  ASSESSMENT & PLAN:  Cancer of central portion of left female breast Left breast invasive ductal carcinoma clinical stage IIA initially  triple negative with a Ki-67 of 47% underwent neoadjuvant chemotherapy followed by left breast lumpectomy, residual 0.4 cm tumor but it was ER positive PR negative HER-2 negative, completed adjuvant radiation therapy and is currently on Arimidex 1 mg daily since May 2015  Arimidex-related toxicities: 1. Osteopenia taking calcium and vitamin D and weightbearing exercises 2. Intermittent hot flashes  Monitoring closely for toxicities  Breast cancer surveillance: 1. Mammogram 07/11/2014 was normal 2. Breast exam December 2015 is normal 3. Bone density May 2015 revealed osteopenia next bone density will be in May 2017  Neuropathy related to prior breast cancer therapy: Patient reports that the Neurontin that she is taking does not seem to be making any difference. I discussed with her about tapering Neurontin dose and discontinuing it over the next 2 or 3 months.  Return to clinic in 6 months for follow-up  No orders of the defined types were placed in this encounter.   The patient has a good understanding of the overall plan. she agrees with it. She will call with any problems that may develop before her next visit here.   Rulon Eisenmenger, MD 09/22/2014 9:46 AM

## 2014-09-22 NOTE — Assessment & Plan Note (Signed)
Left breast invasive ductal carcinoma clinical stage IIA initially triple negative with a Ki-67 of 47% underwent neoadjuvant chemotherapy followed by left breast lumpectomy, residual 0.4 cm tumor but it was ER positive PR negative HER-2 negative, completed adjuvant radiation therapy and is currently on Arimidex 1 mg daily since May 2015  Arimidex-related toxicities: 1. Osteopenia taking calcium and vitamin D and weightbearing exercises  Monitoring closely for toxicities  Breast cancer surveillance: 1. Mammogram 07/11/2014 was normal 2. Breast exam 09/22/2014 is normal 3. Bone density May 2015 revealed osteopenia next bone density will be in May 2017  Return to clinic in 6 months for follow-up

## 2014-09-22 NOTE — Telephone Encounter (Signed)
, °

## 2014-10-02 ENCOUNTER — Other Ambulatory Visit: Payer: Self-pay | Admitting: *Deleted

## 2014-10-02 DIAGNOSIS — C50119 Malignant neoplasm of central portion of unspecified female breast: Secondary | ICD-10-CM

## 2014-10-02 MED ORDER — ANASTROZOLE 1 MG PO TABS: 1.0000 mg | ORAL_TABLET | Freq: Every day | ORAL | Status: DC

## 2014-10-25 ENCOUNTER — Other Ambulatory Visit: Payer: Self-pay | Admitting: *Deleted

## 2014-10-25 MED ORDER — FLUTICASONE PROPIONATE 50 MCG/ACT NA SUSP: 1.0000 | Freq: Every day | NASAL | Status: DC | PRN

## 2015-01-14 ENCOUNTER — Other Ambulatory Visit: Payer: Self-pay | Admitting: Hematology and Oncology

## 2015-01-15 ENCOUNTER — Other Ambulatory Visit: Payer: Self-pay | Admitting: *Deleted

## 2015-01-15 DIAGNOSIS — C50112 Malignant neoplasm of central portion of left female breast: Secondary | ICD-10-CM

## 2015-01-15 MED ORDER — GABAPENTIN 100 MG PO CAPS: ORAL_CAPSULE | ORAL | Status: DC

## 2015-01-31 LAB — TSH: TSH: 3.41 u[IU]/mL (ref <=5.90)

## 2015-01-31 LAB — HEPATIC FUNCTION PANEL
ALT: 21 U/L (ref 7–35)
AST: 20 U/L (ref 13–35)
Alkaline Phosphatase: 90 U/L (ref 25–125)
Bilirubin, Total: 0.3 mg/dL

## 2015-01-31 LAB — BASIC METABOLIC PANEL
BUN: 17 mg/dL (ref 4–21)
Creatinine: 0.7 mg/dL (ref <=1.1)
Glucose: 99 mg/dL
POTASSIUM: 4.2 mmol/L (ref 3.4–5.3)
SODIUM: 143 mmol/L (ref 137–147)

## 2015-01-31 LAB — CBC AND DIFFERENTIAL
HCT: 38 % (ref 36–46)
HEMOGLOBIN: 12.6 g/dL (ref 12.0–16.0)
Platelets: 306 10*3/uL (ref 150–399)
WBC: 7.6 10^3/mL

## 2015-01-31 LAB — LIPID PANEL
CHOLESTEROL: 212 mg/dL — AB (ref 0–200)
HDL: 77 mg/dL — AB (ref 35–70)
LDL Cholesterol: 112 mg/dL
Triglycerides: 113 mg/dL (ref 40–160)

## 2015-02-05 ENCOUNTER — Other Ambulatory Visit: Payer: Self-pay

## 2015-02-05 DIAGNOSIS — C50119 Malignant neoplasm of central portion of unspecified female breast: Secondary | ICD-10-CM

## 2015-02-05 MED ORDER — ANASTROZOLE 1 MG PO TABS: 1.0000 mg | ORAL_TABLET | Freq: Every day | ORAL | Status: DC

## 2015-03-08 IMAGING — CT CT ANGIO CHEST
2 of 6 series · 19 of 36 positions shown · IV contrast (OMNIPAQUE)
Comparison: Chest x-ray on 05/19/2013

CLINICAL DATA: Shortness of breath.  History of breast carcinoma.

EXAM:
CT ANGIOGRAPHY CHEST WITH CONTRAST
TECHNIQUE: Multidetector CT imaging of the chest was performed using the
standard protocol during bolus administration of intravenous
contrast. Multiplanar CT image reconstructions including MIPs were
obtained to evaluate the vascular anatomy.
CONTRAST:  100mL OMNIPAQUE IOHEXOL 350 MG/ML SOLN

[Series 7: pe thins @ 1mm · axial · 0.79mm/px · z∈[-251,-16]mm · 18 of 263 slices shown]
[im 14/263  lung]
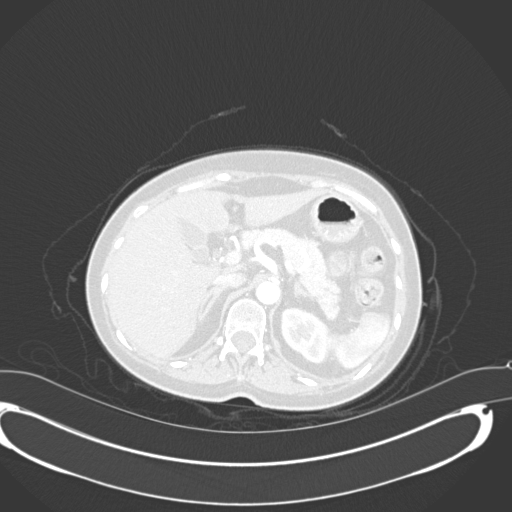
[im 27/263  mediastinal]
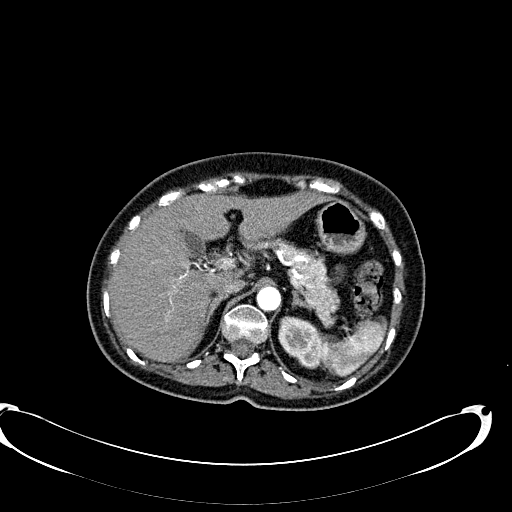
[im 40/263  lung]
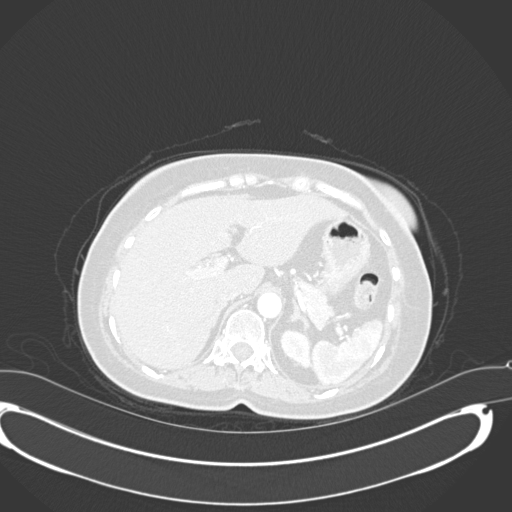
[im 53/263  mediastinal]
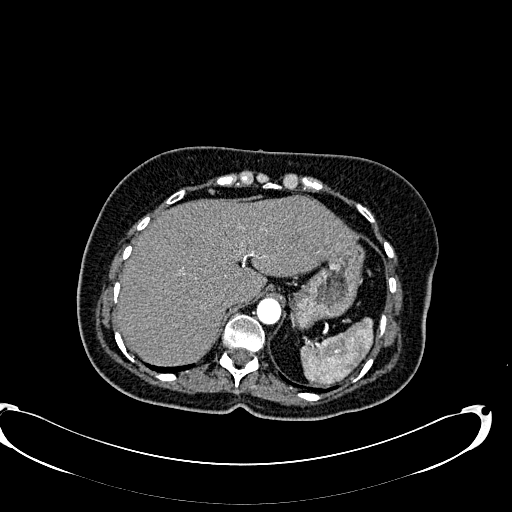
[im 66/263  lung]
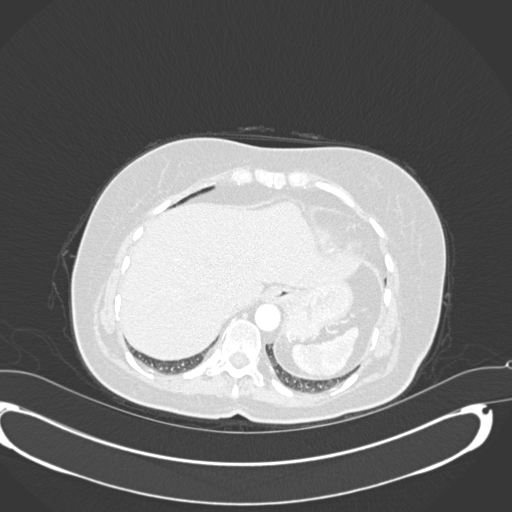
[im 79/263  mediastinal]
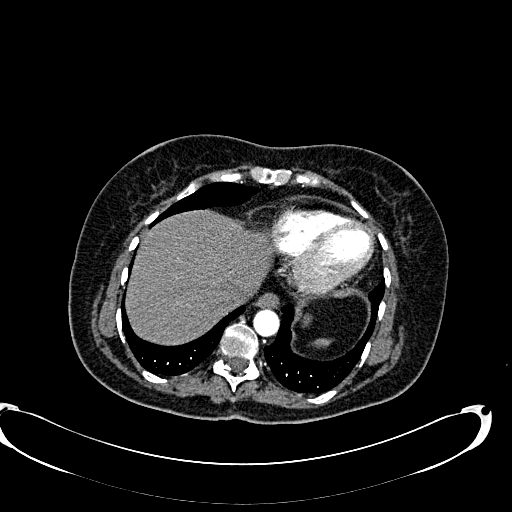
[im 92/263  lung]
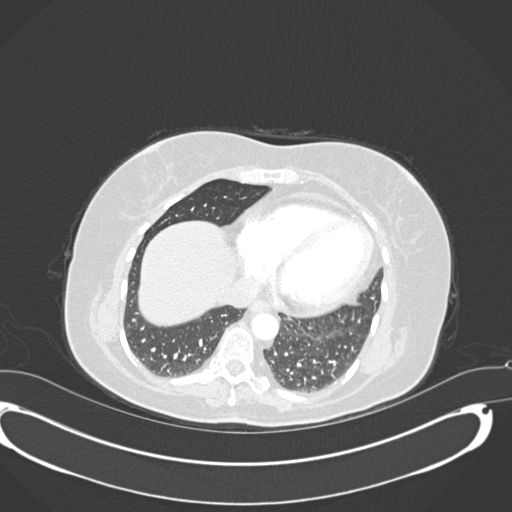
[im 105/263  mediastinal]
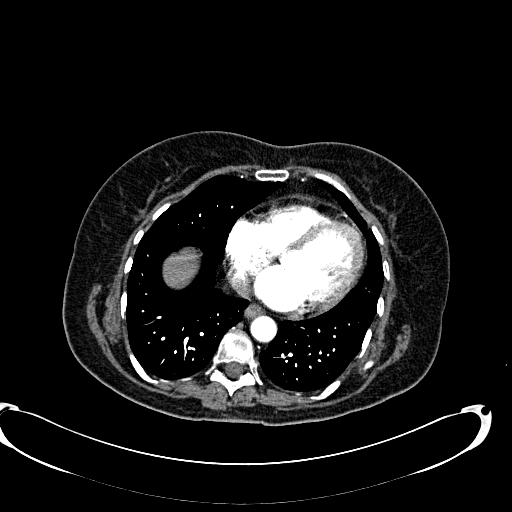
[im 118/263  lung]
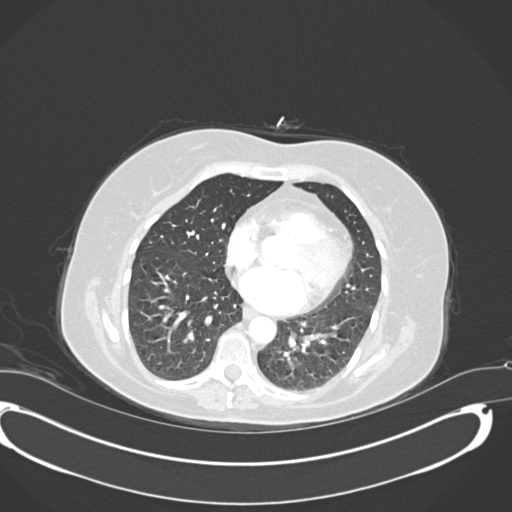
[im 145/263  mediastinal]
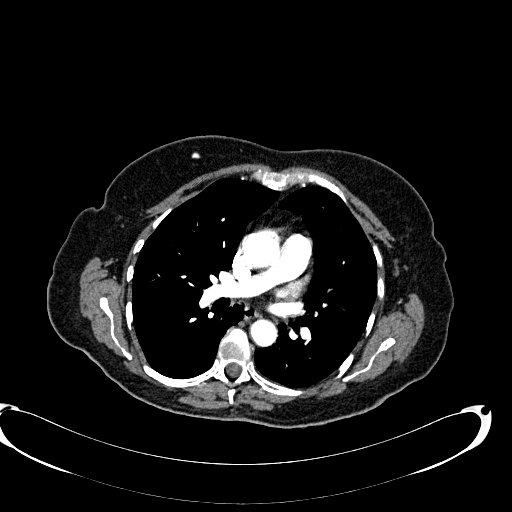
[im 158/263  lung]
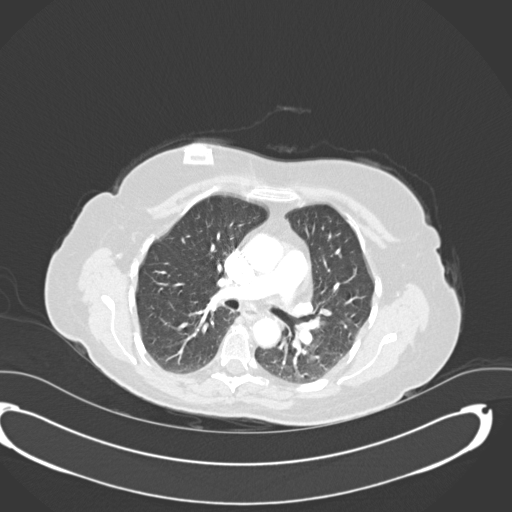
[im 171/263  mediastinal]
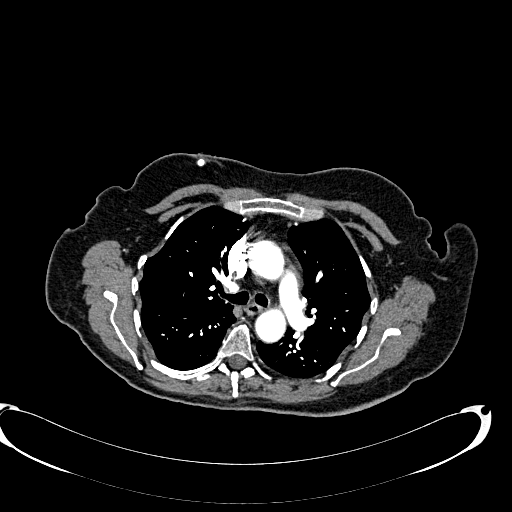
[im 184/263  lung]
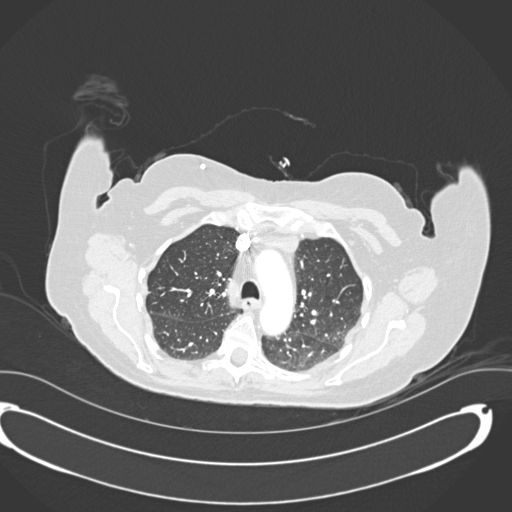
[im 197/263  mediastinal]
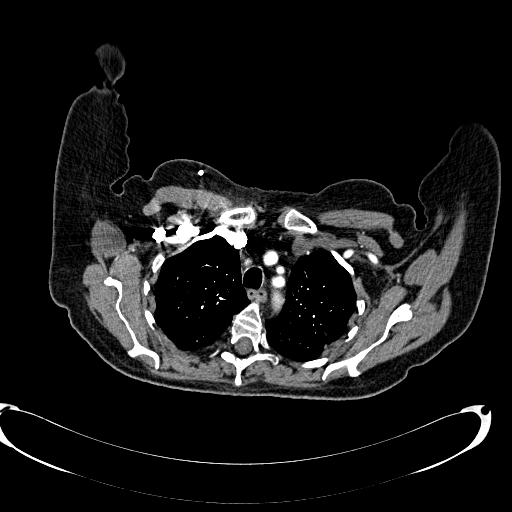
[im 210/263  lung]
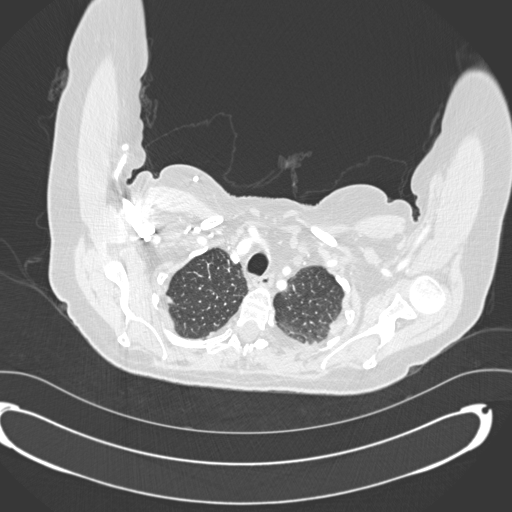
[im 223/263  mediastinal]
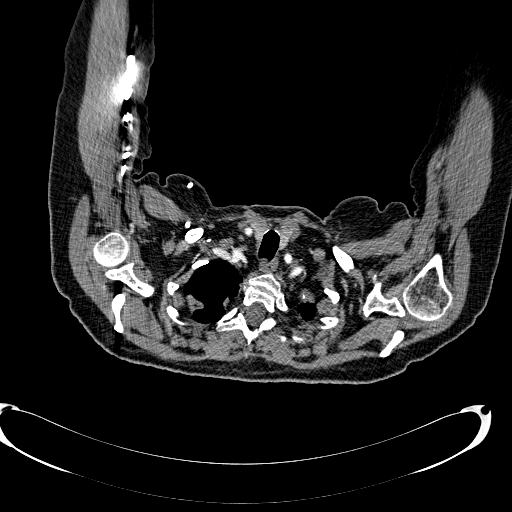
[im 236/263  lung]
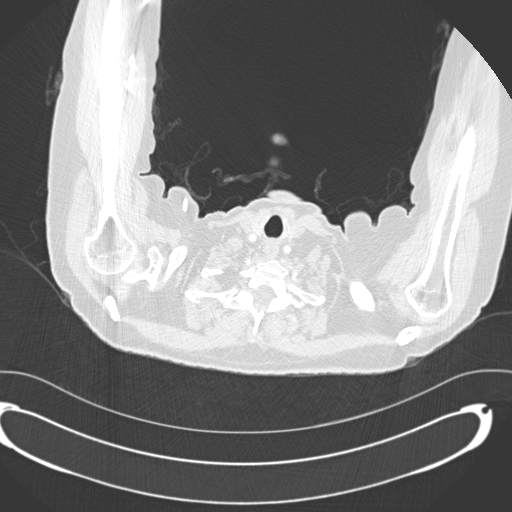
[im 249/263  mediastinal]
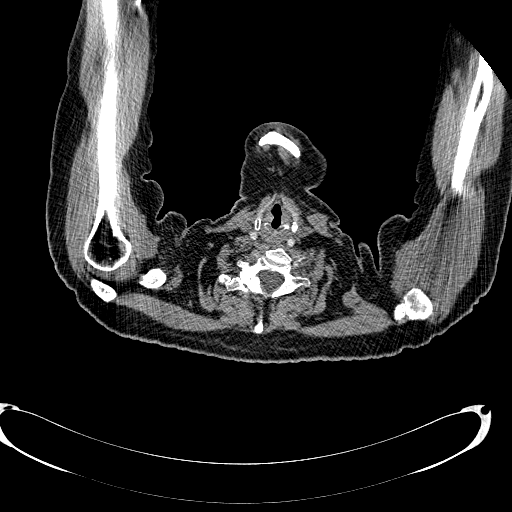

[Series 602: coronals · coronal · 0.79mm/px · 1 of 101 slices shown]
[im 51/101  mediastinal]
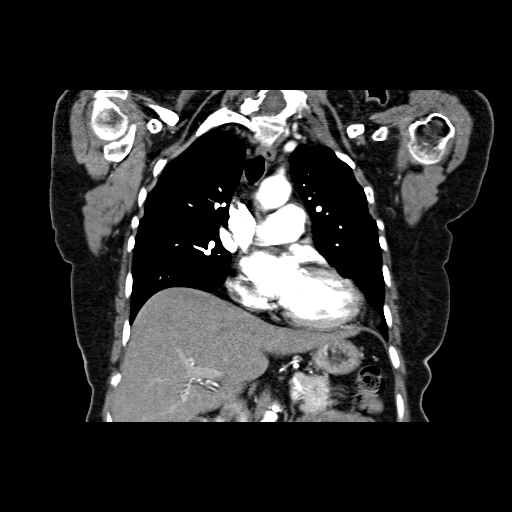

[19 of 36 positions shown; findings below may reference images not displayed]

FINDINGS: The pulmonary arteries are well opacified. There is no evidence of
pulmonary embolism. The thoracic aorta is also opacified and shows
normal patency caliber without evidence of dissection.

Lung windows show no evidence of pulmonary infiltrates, edema or
pleural effusions. The heart size is normal. No pericardial fluid is
seen. No masses or enlarged lymph nodes are identified. A porta cath
is present in the right chest. The bony thorax shows no evidence of
fractures or lesions. A mild scoliosis is present.

Review of the MIP images confirms the above findings.
IMPRESSION: No evidence of pulmonary embolism or other acute findings in the
chest.

## 2015-03-22 ENCOUNTER — Other Ambulatory Visit: Payer: Self-pay | Admitting: *Deleted

## 2015-03-22 DIAGNOSIS — C50112 Malignant neoplasm of central portion of left female breast: Secondary | ICD-10-CM

## 2015-03-23 ENCOUNTER — Ambulatory Visit (HOSPITAL_BASED_OUTPATIENT_CLINIC_OR_DEPARTMENT_OTHER): Payer: Medicare Other | Admitting: Hematology and Oncology

## 2015-03-23 ENCOUNTER — Telehealth: Payer: Self-pay | Admitting: Hematology and Oncology

## 2015-03-23 ENCOUNTER — Encounter: Payer: Self-pay | Admitting: Hematology and Oncology

## 2015-03-23 ENCOUNTER — Other Ambulatory Visit (HOSPITAL_BASED_OUTPATIENT_CLINIC_OR_DEPARTMENT_OTHER): Payer: Medicare Other

## 2015-03-23 VITALS — BP 158/66 | HR 63 | Temp 97.8°F | Resp 18 | Ht <= 58 in | Wt 104.0 lb

## 2015-03-23 DIAGNOSIS — C50812 Malignant neoplasm of overlapping sites of left female breast: Secondary | ICD-10-CM

## 2015-03-23 DIAGNOSIS — C50112 Malignant neoplasm of central portion of left female breast: Secondary | ICD-10-CM

## 2015-03-23 DIAGNOSIS — Z79811 Long term (current) use of aromatase inhibitors: Secondary | ICD-10-CM | POA: Diagnosis not present

## 2015-03-23 DIAGNOSIS — M899 Disorder of bone, unspecified: Secondary | ICD-10-CM | POA: Diagnosis not present

## 2015-03-23 DIAGNOSIS — Z853 Personal history of malignant neoplasm of breast: Secondary | ICD-10-CM

## 2015-03-23 DIAGNOSIS — G620 Drug-induced polyneuropathy: Secondary | ICD-10-CM | POA: Diagnosis not present

## 2015-03-23 LAB — CBC WITH DIFFERENTIAL/PLATELET
BASO%: 0.5 % (ref 0.0–2.0)
BASOS ABS: 0 10*3/uL (ref 0.0–0.1)
EOS%: 2.8 % (ref 0.0–7.0)
Eosinophils Absolute: 0.2 10*3/uL (ref 0.0–0.5)
HEMATOCRIT: 38.7 % (ref 34.8–46.6)
HGB: 12.8 g/dL (ref 11.6–15.9)
LYMPH%: 41.9 % (ref 14.0–49.7)
MCH: 30.1 pg (ref 25.1–34.0)
MCHC: 33.1 g/dL (ref 31.5–36.0)
MCV: 91.1 fL (ref 79.5–101.0)
MONO#: 0.7 10*3/uL (ref 0.1–0.9)
MONO%: 8 % (ref 0.0–14.0)
NEUT#: 3.8 10*3/uL (ref 1.5–6.5)
NEUT%: 46.8 % (ref 38.4–76.8)
Platelets: 240 10*3/uL (ref 145–400)
RBC: 4.25 10*6/uL (ref 3.70–5.45)
RDW: 13.8 % (ref 11.2–14.5)
WBC: 8.1 10*3/uL (ref 3.9–10.3)
lymph#: 3.4 10*3/uL — ABNORMAL HIGH (ref 0.9–3.3)

## 2015-03-23 LAB — COMPREHENSIVE METABOLIC PANEL (CC13)
ALK PHOS: 96 U/L (ref 40–150)
ALT: 22 U/L (ref 0–55)
AST: 20 U/L (ref 5–34)
Albumin: 4.1 g/dL (ref 3.5–5.0)
Anion Gap: 10 mEq/L (ref 3–11)
BUN: 15 mg/dL (ref 7.0–26.0)
CALCIUM: 10.3 mg/dL (ref 8.4–10.4)
CHLORIDE: 106 meq/L (ref 98–109)
CO2: 27 mEq/L (ref 22–29)
Creatinine: 0.8 mg/dL (ref 0.6–1.1)
EGFR: 76 mL/min/{1.73_m2} — ABNORMAL LOW (ref >90)
Glucose: 88 mg/dl (ref 70–140)
POTASSIUM: 4.6 meq/L (ref 3.5–5.1)
SODIUM: 143 meq/L (ref 136–145)
Total Bilirubin: 0.42 mg/dL (ref 0.20–1.20)
Total Protein: 7.1 g/dL (ref 6.4–8.3)

## 2015-03-23 NOTE — Assessment & Plan Note (Addendum)
Left breast invasive ductal carcinoma clinical stage IIA initially triple negative with a Ki-67 of 47% underwent neoadjuvant chemotherapy followed by left breast lumpectomy, residual 0.4 cm tumor but it was ER positive PR negative HER-2 negative, completed adjuvant radiation therapy and is currently on Arimidex 1 mg daily since May 2015  Arimidex-related toxicities: 1. Osteopenia taking calcium and vitamin D and weightbearing exercises 2. Intermittent hot flashes  Monitoring closely for toxicities  Breast cancer surveillance: 1. Mammogram 07/11/2014 was normal 2. Breast exam 03/23/2015 is normal 3. Bone density May 2015 revealed osteopenia next bone density will be in May 2017  Neuropathy related to prior breast cancer therapy: Patient reports that the Neurontin that she is taking does not seem to be making any difference. I discussed with her about tapering Neurontin dose and discontinuing it over the next 2 or 3 months.  Return to clinic in 6 months for follow-up

## 2015-03-23 NOTE — Telephone Encounter (Signed)
Gave and printed appt sched and avs for pt for March 2017 °

## 2015-03-23 NOTE — Progress Notes (Signed)
Patient Care Team: Christain Sacramento, MD as PCP - General (Family Medicine) Maisie Fus, MD as Consulting Physician (Obstetrics and Gynecology) Consuela Mimes, MD as Consulting Physician (Oncology) Thea Silversmith, MD as Consulting Physician (Radiation Oncology)  DIAGNOSIS: Cancer of central portion of left female breast   Staging form: Breast, AJCC 7th Edition     Clinical: Stage IIA (T2, N0, cM0) - Unsigned       Staging comments: Staged at breast conference 04/27/13      Pathologic: No stage assigned - Unsigned   SUMMARY OF ONCOLOGIC HISTORY:   Cancer of central portion of left female breast   04/15/2013 Initial Diagnosis Left breast biopsy invasive ductal carcinoma with papillary features intermediate to high-grade, ER negative PR negative HER-2 negative Ki-67 47%   04/25/2013 Breast MRI Left breast 12:00 position 2.4 cm area of abnormality, no lymph nodes   05/13/2013 - 09/02/2013 Neo-Adjuvant Chemotherapy Adriamycin and Cytoxan 4 cycles followed by Taxol and carboplatin completed 9 out of 12 stopped due to Neuropathy   10/17/2013 Surgery Left breast lumpectomy: 0.4 cm residual invasive ductal carcinoma 1 sentinel lymph node negative ER 52%, ER 0%, HER-2 negative ratio 1.18   11/14/2013 - 12/30/2013 Radiation Therapy Adjuvant radiation therapy   01/20/2014 -  Anti-estrogen oral therapy Arimidex 1 mg daily plan is for 5 years    CHIEF COMPLIANT: Follow-up on Arimidex  INTERVAL HISTORY: Ellen Beltran is a 69 year old with above-mentioned history of left breast cancer currently on Arimidex therapy since May 2015. She is tolerating Arimidex extremely well without any major problems or concerns. She does have intermittent hot flashes. Denies any lumps or nodules in the breasts.  REVIEW OF SYSTEMS:   Constitutional: Denies fevers, chills or abnormal weight loss Eyes: Denies blurriness of vision Ears, nose, mouth, throat, and face: Denies mucositis or sore throat Respiratory: Denies cough, dyspnea  or wheezes Cardiovascular: Denies palpitation, chest discomfort or lower extremity swelling Gastrointestinal:  Denies nausea, heartburn or change in bowel habits Skin: Denies abnormal skin rashes Lymphatics: Denies new lymphadenopathy or easy bruising Neurological:Denies numbness, tingling or new weaknesses Behavioral/Psych: Mood is stable, no new changes  Breast:  denies any pain or lumps or nodules in either breasts All other systems were reviewed with the patient and are negative.  I have reviewed the past medical history, past surgical history, social history and family history with the patient and they are unchanged from previous note.  ALLERGIES:  is allergic to codeine; morphine and related; and sulfa antibiotics.  MEDICATIONS:  Current Outpatient Prescriptions  Medication Sig Dispense Refill  . anastrozole (ARIMIDEX) 1 MG tablet Take 1 tablet (1 mg total) by mouth daily. 30 tablet 3  . Benfotiamine 150 MG CAPS Take 150 mg by mouth daily.     . Biotin 5000 MCG CAPS Take by mouth.    . fluticasone (FLONASE) 50 MCG/ACT nasal spray Place 1 spray into both nostrils daily as needed. congestion 16 g 2  . gabapentin (NEURONTIN) 100 MG capsule TAKE 1-2 CAPSULES BY MOUTH 3 TIMES A DAY 270 capsule 1  . Multiple Vitamins-Minerals (ALIVE WOMENS 50+ PO) Take 1 each by mouth daily.     . simvastatin (ZOCOR) 40 MG tablet Take 40 mg by mouth every evening.     No current facility-administered medications for this visit.    PHYSICAL EXAMINATION: ECOG PERFORMANCE STATUS: 0 - Asymptomatic  Filed Vitals:   03/23/15 0924  BP: 158/66  Pulse: 63  Temp: 97.8 F (36.6 C)  Resp:  18   Filed Weights   03/23/15 0924  Weight: 104 lb (47.174 kg)    GENERAL:alert, no distress and comfortable SKIN: skin color, texture, turgor are normal, no rashes or significant lesions EYES: normal, Conjunctiva are pink and non-injected, sclera clear OROPHARYNX:no exudate, no erythema and lips, buccal mucosa,  and tongue normal  NECK: supple, thyroid normal size, non-tender, without nodularity LYMPH:  no palpable lymphadenopathy in the cervical, axillary or inguinal LUNGS: clear to auscultation and percussion with normal breathing effort HEART: regular rate & rhythm and no murmurs and no lower extremity edema ABDOMEN:abdomen soft, non-tender and normal bowel sounds Musculoskeletal:no cyanosis of digits and no clubbing  NEURO: alert & oriented x 3 with fluent speech, no focal motor/sensory deficits BREAST: No palpable masses or nodules in either right or left breasts. No palpable axillary supraclavicular or infraclavicular adenopathy no breast tenderness or nipple discharge. (exam performed in the presence of a chaperone)  LABORATORY DATA:  I have reviewed the data as listed   Chemistry      Component Value Date/Time   NA 142 09/22/2014 0838   NA 134* 07/26/2014 0600   K 4.1 09/22/2014 0838   K 3.5* 07/26/2014 0600   CL 93* 07/26/2014 0600   CO2 30* 09/22/2014 0838   CO2 26 07/26/2014 0600   BUN 15.5 09/22/2014 0838   BUN 16 07/26/2014 0600   CREATININE 0.8 09/22/2014 0838   CREATININE 0.90 07/26/2014 0600      Component Value Date/Time   CALCIUM 10.2 09/22/2014 0838   CALCIUM 10.3 07/26/2014 0600   ALKPHOS 87 09/22/2014 0838   ALKPHOS 109 07/28/2014 0915   AST 25 09/22/2014 0838   AST 296* 07/28/2014 0915   ALT 27 09/22/2014 0838   ALT 328* 07/28/2014 0915   BILITOT 0.35 09/22/2014 0838   BILITOT 0.2* 07/28/2014 0915       Lab Results  Component Value Date   WBC 8.1 03/23/2015   HGB 12.8 03/23/2015   HCT 38.7 03/23/2015   MCV 91.1 03/23/2015   PLT 240 03/23/2015   NEUTROABS 3.8 03/23/2015   ASSESSMENT & PLAN:  Cancer of central portion of left female breast Left breast invasive ductal carcinoma clinical stage IIA initially triple negative with a Ki-67 of 47% underwent neoadjuvant chemotherapy followed by left breast lumpectomy, residual 0.4 cm tumor but it was ER  positive PR negative HER-2 negative, completed adjuvant radiation therapy and is currently on Arimidex 1 mg daily since May 2015  Arimidex-related toxicities: 1. Osteopenia taking calcium and vitamin D and weightbearing exercises 2. Intermittent hot flashes  Monitoring closely for toxicities  Breast cancer surveillance: 1. Mammogram 07/11/2014 was normal 2. Breast exam 03/23/2015 is normal 3. Bone density May 2015 revealed osteopenia next bone density will be in May 2017  Neuropathy related to prior breast cancer therapy: Patient reports that the Neurontin that she is taking does not seem to be making any difference. I discussed with her about tapering Neurontin dose and discontinuing it over the next 2 or 3 months.  Return to clinic in March 2017 for follow-up   No orders of the defined types were placed in this encounter.   The patient has a good understanding of the overall plan. she agrees with it. she will call with any problems that may develop before the next visit here.   Rulon Eisenmenger, MD

## 2015-05-30 ENCOUNTER — Other Ambulatory Visit: Payer: Self-pay | Admitting: Hematology and Oncology

## 2015-05-30 DIAGNOSIS — Z853 Personal history of malignant neoplasm of breast: Secondary | ICD-10-CM

## 2015-06-08 ENCOUNTER — Other Ambulatory Visit: Payer: Self-pay | Admitting: Hematology and Oncology

## 2015-07-18 ENCOUNTER — Ambulatory Visit
Admission: RE | Admit: 2015-07-18 | Discharge: 2015-07-18 | Disposition: A | Discharge status: Home / Self Care | Payer: Medicare Other | Source: Ambulatory Visit | Attending: Hematology and Oncology | Admitting: Hematology and Oncology

## 2015-07-18 DIAGNOSIS — Z853 Personal history of malignant neoplasm of breast: Secondary | ICD-10-CM

## 2015-10-05 ENCOUNTER — Other Ambulatory Visit: Payer: Self-pay | Admitting: Hematology and Oncology

## 2015-10-05 DIAGNOSIS — C50112 Malignant neoplasm of central portion of left female breast: Secondary | ICD-10-CM

## 2015-10-20 ENCOUNTER — Other Ambulatory Visit: Payer: Self-pay | Admitting: Hematology and Oncology

## 2015-10-22 ENCOUNTER — Other Ambulatory Visit: Payer: Self-pay | Admitting: *Deleted

## 2015-11-20 NOTE — Assessment & Plan Note (Signed)
Left breast invasive ductal carcinoma clinical stage IIA initially triple negative with a Ki-67 of 47% underwent neoadjuvant chemotherapy followed by left breast lumpectomy, residual 0.4 cm tumor but it was ER positive PR negative HER-2 negative, completed adjuvant radiation therapy and is currently on Arimidex 1 mg daily since May 2015  Arimidex-related toxicities: 1. Osteopenia taking calcium and vitamin D and weightbearing exercises 2. Intermittent hot flashes  Monitoring closely for toxicities  Breast cancer surveillance: 1. Mammogram 07/22/15 was normal 2. Breast exam 11/21/15 is normal 3. Bone density May 2015 revealed osteopenia next bone density will be in May 2017  Neuropathy related to prior breast cancer therapy: She tapered Neurontin and discontinuing it  Return to clinic in 6 months for follow-up

## 2015-11-21 ENCOUNTER — Telehealth: Payer: Self-pay | Admitting: Hematology and Oncology

## 2015-11-21 ENCOUNTER — Ambulatory Visit (HOSPITAL_BASED_OUTPATIENT_CLINIC_OR_DEPARTMENT_OTHER): Payer: PPO | Admitting: Hematology and Oncology

## 2015-11-21 ENCOUNTER — Encounter: Payer: Self-pay | Admitting: Hematology and Oncology

## 2015-11-21 VITALS — BP 142/77 | HR 68 | Temp 98.1°F | Resp 18 | Ht <= 58 in | Wt 103.9 lb

## 2015-11-21 DIAGNOSIS — M8588 Other specified disorders of bone density and structure, other site: Secondary | ICD-10-CM | POA: Diagnosis not present

## 2015-11-21 DIAGNOSIS — C50112 Malignant neoplasm of central portion of left female breast: Secondary | ICD-10-CM | POA: Diagnosis not present

## 2015-11-21 DIAGNOSIS — G609 Hereditary and idiopathic neuropathy, unspecified: Secondary | ICD-10-CM | POA: Diagnosis not present

## 2015-11-21 NOTE — Telephone Encounter (Signed)
appt made and avs printed °

## 2015-11-21 NOTE — Progress Notes (Signed)
Patient Care Team: Christain Sacramento, MD as PCP - General (Family Medicine) Maisie Fus, MD as Consulting Physician (Obstetrics and Gynecology) Consuela Mimes, MD as Consulting Physician (Oncology) Thea Silversmith, MD as Consulting Physician (Radiation Oncology)  DIAGNOSIS: Cancer of central portion of left female breast [facility])   Staging form: Breast, AJCC 7th Edition     Clinical: Stage IIA (T2, N0, cM0) - Unsigned       Staging comments: Staged at breast conference 04/27/13      Pathologic: No stage assigned - Unsigned   SUMMARY OF ONCOLOGIC HISTORY:   Cancer of central portion of left female breast ([place])   04/15/2013 Initial Diagnosis Left breast biopsy invasive ductal carcinoma with papillary features intermediate to high-grade, ER negative PR negative HER-2 negative Ki-67 47%   04/25/2013 Breast MRI Left breast 12:00 position 2.4 cm area of abnormality, no lymph nodes   05/13/2013 - 09/02/2013 Neo-Adjuvant Chemotherapy Adriamycin and Cytoxan 4 cycles followed by Taxol and carboplatin completed 9 out of 12 stopped due to Neuropathy   10/17/2013 Surgery Left breast lumpectomy: 0.4 cm residual invasive ductal carcinoma 1 sentinel lymph node negative ER 52%, ER 0%, HER-2 negative ratio 1.18   11/14/2013 - 12/30/2013 Radiation Therapy Adjuvant radiation therapy   01/20/2014 -  Anti-estrogen oral therapy Arimidex 1 mg daily plan is for 5 years    CHIEF COMPLIANT: Follow-up on anastrozole  INTERVAL HISTORY: Ellen Beltran is a 70 year old with above-mentioned history of breast cancer currently on anastrozole adjuvant therapy since May 2015. She appears to be tolerating it moderately well with complaints of hot flashes. These appear to be on and off. She is used to it more often than before. She does take Neurontin for neuropathy which appears to be helping her hot flashes mildly. She denies any lumps or nodules in the breast. She has excellent energy. She does have some alopecia for which she is using  biotin.  REVIEW OF SYSTEMS:   Constitutional: Denies fevers, chills or abnormal weight loss Eyes: Denies blurriness of vision Ears, nose, mouth, throat, and face: Denies mucositis or sore throat Respiratory: Denies cough, dyspnea or wheezes Cardiovascular: Denies palpitation, chest discomfort Gastrointestinal:  Denies nausea, heartburn or change in bowel habits Skin: Denies abnormal skin rashes Lymphatics: Denies new lymphadenopathy or easy bruising Neurological:Denies numbness, tingling or new weaknesses Behavioral/Psych: Mood is stable, no new changes  Extremities: No lower extremity edema Breast:  denies any pain or lumps or nodules in either breasts All other systems were reviewed with the patient and are negative.  I have reviewed the past medical history, past surgical history, social history and family history with the patient and they are unchanged from previous note.  ALLERGIES:  is allergic to codeine; morphine and related; and sulfa antibiotics.  MEDICATIONS:  Current Outpatient Prescriptions  Medication Sig Dispense Refill  . anastrozole (ARIMIDEX) 1 MG tablet TAKE 1 TABLET (1 MG TOTAL) BY MOUTH DAILY. 90 tablet 3  . Biotin 5000 MCG CAPS Take by mouth.    . Calcium Citrate-Vitamin D (CALCIUM CITRATE + D3 PO) Take by mouth daily.    . fluticasone (FLONASE) 50 MCG/ACT nasal spray Place 1 spray into both nostrils daily as needed. congestion 16 g 2  . gabapentin (NEURONTIN) 100 MG capsule Take 100 mg by mouth 2 (two) times daily.    . Multiple Vitamins-Minerals (ALIVE WOMENS 50+ PO) Take 1 each by mouth daily.     . simvastatin (ZOCOR) 40 MG tablet Take 40 mg by  mouth every evening.     No current facility-administered medications for this visit.    PHYSICAL EXAMINATION: ECOG PERFORMANCE STATUS: 1 - Symptomatic but completely ambulatory  Filed Vitals:   11/21/15 0804  BP: 142/77  Pulse: 68  Temp: 98.1 F (36.7 C)  Resp: 18   Filed Weights   11/21/15 0804    Weight: 103 lb 14.4 oz (47.129 kg)    GENERAL:alert, no distress and comfortable SKIN: skin color, texture, turgor are normal, no rashes or significant lesions EYES: normal, Conjunctiva are pink and non-injected, sclera clear OROPHARYNX:no exudate, no erythema and lips, buccal mucosa, and tongue normal  NECK: supple, thyroid normal size, non-tender, without nodularity LYMPH:  no palpable lymphadenopathy in the cervical, axillary or inguinal LUNGS: clear to auscultation and percussion with normal breathing effort HEART: regular rate & rhythm and no murmurs and no lower extremity edema ABDOMEN:abdomen soft, non-tender and normal bowel sounds MUSCULOSKELETAL:no cyanosis of digits and no clubbing  NEURO: alert & oriented x 3 with fluent speech, no focal motor/sensory deficits EXTREMITIES: No lower extremity edema BREAST: No palpable masses or nodules in either right or left breasts. No palpable axillary supraclavicular or infraclavicular adenopathy no breast tenderness or nipple discharge. (exam performed in the presence of a chaperone)  LABORATORY DATA:  I have reviewed the data as listed   Chemistry      Component Value Date/Time   NA 143 03/23/2015 0912   NA 134* 07/26/2014 0600   K 4.6 03/23/2015 0912   K 3.5* 07/26/2014 0600   CL 93* 07/26/2014 0600   CO2 27 03/23/2015 0912   CO2 26 07/26/2014 0600   BUN 15.0 03/23/2015 0912   BUN 16 07/26/2014 0600   CREATININE 0.8 03/23/2015 0912   CREATININE 0.90 07/26/2014 0600      Component Value Date/Time   CALCIUM 10.3 03/23/2015 0912   CALCIUM 10.3 07/26/2014 0600   ALKPHOS 96 03/23/2015 0912   ALKPHOS 109 07/28/2014 0915   AST 20 03/23/2015 0912   AST 296* 07/28/2014 0915   ALT 22 03/23/2015 0912   ALT 328* 07/28/2014 0915   BILITOT 0.42 03/23/2015 0912   BILITOT 0.2* 07/28/2014 0915       Lab Results  Component Value Date   WBC 8.1 03/23/2015   HGB 12.8 03/23/2015   HCT 38.7 03/23/2015   MCV 91.1 03/23/2015   PLT  240 03/23/2015   NEUTROABS 3.8 03/23/2015     ASSESSMENT & PLAN:  Cancer of central portion of left female breast Left breast invasive ductal carcinoma clinical stage IIA initially triple negative with a Ki-67 of 47% underwent neoadjuvant chemotherapy followed by left breast lumpectomy, residual 0.4 cm tumor but it was ER positive PR negative HER-2 negative, completed adjuvant radiation therapy and is currently on Arimidex 1 mg daily since May 2015  Arimidex-related toxicities: 1. Osteopenia taking calcium and vitamin D and weightbearing exercises 2. Intermittent hot flashes  Monitoring closely for toxicities  Breast cancer surveillance: 1. Mammogram 07/22/15 was normal 2. Breast exam 11/21/15 is normal 3. Bone density May 2015 revealed osteopenia next bone density will be in May 2017  Neuropathy related to prior breast cancer therapy: She tapered Neurontin and discontinuing it  Return to clinic in 6 months for follow-up and after that we can see her once a year   No orders of the defined types were placed in this encounter.   The patient has a good understanding of the overall plan. she agrees with it. she will  call with any problems that may develop before the next visit here.   Rulon Eisenmenger, MD 11/21/2015

## 2015-11-23 ENCOUNTER — Ambulatory Visit: Payer: Medicare Other | Admitting: Hematology and Oncology

## 2015-12-19 ENCOUNTER — Encounter: Payer: Self-pay | Admitting: General Practice

## 2016-01-16 ENCOUNTER — Encounter: Payer: Self-pay | Admitting: General Practice

## 2016-01-17 ENCOUNTER — Encounter: Payer: Self-pay | Admitting: Family Medicine

## 2016-01-17 ENCOUNTER — Ambulatory Visit (INDEPENDENT_AMBULATORY_CARE_PROVIDER_SITE_OTHER): Payer: PPO | Admitting: Family Medicine

## 2016-01-17 VITALS — BP 138/70 | HR 77 | Temp 98.1°F | Resp 16 | Ht <= 58 in | Wt 104.0 lb

## 2016-01-17 DIAGNOSIS — E785 Hyperlipidemia, unspecified: Secondary | ICD-10-CM | POA: Insufficient documentation

## 2016-01-17 DIAGNOSIS — E559 Vitamin D deficiency, unspecified: Secondary | ICD-10-CM | POA: Diagnosis not present

## 2016-01-17 DIAGNOSIS — Z0000 Encounter for general adult medical examination without abnormal findings: Secondary | ICD-10-CM | POA: Insufficient documentation

## 2016-01-17 DIAGNOSIS — M8580 Other specified disorders of bone density and structure, unspecified site: Secondary | ICD-10-CM | POA: Insufficient documentation

## 2016-01-17 LAB — CBC WITH DIFFERENTIAL/PLATELET
BASOS PCT: 0 %
Basophils Absolute: 0 cells/uL (ref 0–200)
EOS PCT: 3 %
Eosinophils Absolute: 297 cells/uL (ref 15–500)
HEMATOCRIT: 39.1 % (ref 35.0–45.0)
HEMOGLOBIN: 12.9 g/dL (ref 11.7–15.5)
LYMPHS ABS: 3564 {cells}/uL (ref 850–3900)
Lymphocytes Relative: 36 %
MCH: 29.7 pg (ref 27.0–33.0)
MCHC: 33 g/dL (ref 32.0–36.0)
MCV: 89.9 fL (ref 80.0–100.0)
MONO ABS: 990 {cells}/uL — AB (ref 200–950)
MPV: 9 fL (ref 7.5–12.5)
Monocytes Relative: 10 %
NEUTROS ABS: 5049 {cells}/uL (ref 1500–7800)
NEUTROS PCT: 51 %
PLATELETS: 328 10*3/uL (ref 140–400)
RBC: 4.35 MIL/uL (ref 3.80–5.10)
RDW: 13.6 % (ref 11.0–15.0)
WBC: 9.9 10*3/uL (ref 3.8–10.8)

## 2016-01-17 LAB — HEPATIC FUNCTION PANEL
ALT: 18 U/L (ref 6–29)
AST: 20 U/L (ref 10–35)
Albumin: 4.7 g/dL (ref 3.6–5.1)
Alkaline Phosphatase: 83 U/L (ref 33–130)
BILIRUBIN DIRECT: 0.1 mg/dL (ref <=0.2)
BILIRUBIN INDIRECT: 0.4 mg/dL (ref 0.2–1.2)
BILIRUBIN TOTAL: 0.5 mg/dL (ref 0.2–1.2)
Total Protein: 7.1 g/dL (ref 6.1–8.1)

## 2016-01-17 LAB — LIPID PANEL
CHOL/HDL RATIO: 2.8 ratio (ref <=5.0)
Cholesterol: 219 mg/dL — ABNORMAL HIGH (ref 125–200)
HDL: 78 mg/dL (ref >=46)
LDL CALC: 116 mg/dL (ref <130)
Triglycerides: 123 mg/dL (ref <150)
VLDL: 25 mg/dL (ref <30)

## 2016-01-17 LAB — BASIC METABOLIC PANEL
BUN: 14 mg/dL (ref 7–25)
CHLORIDE: 100 mmol/L (ref 98–110)
CO2: 28 mmol/L (ref 20–31)
CREATININE: 0.7 mg/dL (ref 0.50–0.99)
Calcium: 10.5 mg/dL — ABNORMAL HIGH (ref 8.6–10.4)
Glucose, Bld: 86 mg/dL (ref 65–99)
POTASSIUM: 4.3 mmol/L (ref 3.5–5.3)
Sodium: 141 mmol/L (ref 135–146)

## 2016-01-17 NOTE — Progress Notes (Signed)
   Subjective:    Patient ID: Ellen Beltran, female    DOB: 06/28/1946, 70 y.o.   MRN: [id number]  HPI New to establish.  Previous MD- [facility] Pulling.  Here today for CPE.  Risk Factors: Hyperlipidemia- chronic problem, on Simvastatin Cancer- L breast, following w/ Dr Lindi Adie.  On Arimedex.  Has residual chemo neuropathy.  Taking gabapentin once nightly Osteopenia- due for DEXA later this year.  On Ca and Vit D when she remembers Physical Activity: walking but not regularly Fall Risk: low Depression: denies Hearing: normal to conversational tones w/ hearing aides ADL's: independent Cognitive: normal linear thought process, memory and attention intact Home Safety: safe at home, lives w/ husband Height, Weight, BMI, Visual Acuity: see vitals, vision corrected to 20/20 w/ glasses Counseling: UTD on colonoscopy Henrene Pastor- 2023) , mammo, DEXA, UTD on immunizations Care team reviewed and updated Labs Ordered: See A&P Care Plan: See A&P    Review of Systems Patient reports no vision/ hearing changes, adenopathy,fever, weight change,  persistant/recurrent hoarseness , swallowing issues, chest pain, palpitations, edema, persistant/recurrent cough, hemoptysis, dyspnea (rest/exertional/paroxysmal nocturnal), gastrointestinal bleeding (melena, rectal bleeding), abdominal pain, significant heartburn, bowel changes, GU symptoms (dysuria, hematuria, incontinence), Gyn symptoms (abnormal  bleeding, pain),  syncope, focal weakness, memory loss, skin/hair/nail changes, abnormal bruising or bleeding, anxiety, or depression.     Objective:   Physical Exam General Appearance:    Alert, cooperative, no distress, appears stated age  Head:    Normocephalic, without obvious abnormality, atraumatic  Eyes:    PERRL, conjunctiva/corneas clear, EOM's intact, fundi    benign, both eyes  Ears:    Normal TM's and external ear canals, both ears  Nose:   Nares normal, septum midline, mucosa normal, no drainage    or sinus  tenderness  Throat:   Lips, mucosa, and tongue normal; teeth and gums normal  Neck:   Supple, symmetrical, trachea midline, no adenopathy;    Thyroid: no enlargement/tenderness/nodules  Back:     Symmetric, no curvature, ROM normal, no CVA tenderness  Lungs:     Clear to auscultation bilaterally, respirations unlabored  Chest Wall:    No tenderness or deformity   Heart:    Regular rate and rhythm, S1 and S2 normal, no murmur, rub   or gallop  Breast Exam:    Deferred to GYN  Abdomen:     Soft, non-tender, bowel sounds active all four quadrants,    no masses, no organomegaly  Genitalia:    Deferred to GYN  Rectal:    Extremities:   Extremities normal, atraumatic, no cyanosis or edema  Pulses:   2+ and symmetric all extremities  Skin:   Skin color, texture, turgor normal, no rashes or lesions, + vitiligo  Lymph nodes:   Cervical, supraclavicular, and axillary nodes normal  Neurologic:   CNII-XII intact, normal strength, sensation and reflexes    throughout          Assessment & Plan:

## 2016-01-17 NOTE — Assessment & Plan Note (Signed)
New to provider, ongoing for pt.  Check Vit D level- replete prn.  Due for DEXA later this year.  Pt to call when she gets her reminder in the mail so I can place order.  Pt expressed understanding and is in agreement w/ plan.

## 2016-01-17 NOTE — Progress Notes (Signed)
Pre visit review using our clinic review tool, if applicable. No additional management support is needed unless otherwise documented below in the visit note. 

## 2016-01-17 NOTE — Assessment & Plan Note (Signed)
Pt's PE WNL.  UTD on colonoscopy, mammo, DEXA.  Written screening schedule updated and given to pt.  UTD on immunizations.  Check labs.  Anticipatory guidance provided.

## 2016-01-17 NOTE — Assessment & Plan Note (Signed)
New to provider, ongoing for pt.  Tolerating statin w/o difficulty but she would like to wean if possible.  Check labs and decrease statin if able.  Pt expressed understanding and is in agreement w/ plan.

## 2016-01-17 NOTE — Patient Instructions (Signed)
Follow up in 1 year or as needed We'll notify you of your lab results and make any changes if needed Keep up the good work on healthy diet and regular exercise- you look great! You are due for your mammogram and bone density in November- when you get the reminder, please let me know and I'll enter the orders You are up to date on colonoscopy until 2023- yay! Call with any questions or concerns Welcome!  We're glad to have you!

## 2016-01-18 ENCOUNTER — Other Ambulatory Visit: Payer: Self-pay | Admitting: General Practice

## 2016-01-18 LAB — TSH: TSH: 3.15 m[IU]/L

## 2016-01-18 LAB — VITAMIN D 25 HYDROXY (VIT D DEFICIENCY, FRACTURES): Vit D, 25-Hydroxy: 20 ng/mL — ABNORMAL LOW (ref 30–100)

## 2016-01-18 MED ORDER — VITAMIN D (ERGOCALCIFEROL) 1.25 MG (50000 UNIT) PO CAPS: 50000.0000 [IU] | ORAL_CAPSULE | ORAL | Status: DC

## 2016-04-10 DIAGNOSIS — N6452 Nipple discharge: Secondary | ICD-10-CM | POA: Diagnosis not present

## 2016-04-10 DIAGNOSIS — Z853 Personal history of malignant neoplasm of breast: Secondary | ICD-10-CM | POA: Diagnosis not present

## 2016-04-18 ENCOUNTER — Telehealth: Payer: Self-pay | Admitting: Family Medicine

## 2016-04-18 NOTE — Telephone Encounter (Signed)
Pt states that she is out of the Vitamin D, Ergocalciferol and asking if KT wants her to come in to ck vit D or does a refill need to be called in, [facility] [place]

## 2016-04-21 NOTE — Telephone Encounter (Signed)
Pt should continue to take OTC supplement daily (either 2,000 or 5,000 units).  Insurance only covers 1 Vit D check yearly so we will repeat this at her next physical.  No need for refill at this time

## 2016-04-21 NOTE — Telephone Encounter (Signed)
Patient notified of PCP recommendations and is agreement and expresses an understanding.  

## 2016-04-24 DIAGNOSIS — H2513 Age-related nuclear cataract, bilateral: Secondary | ICD-10-CM | POA: Diagnosis not present

## 2016-04-24 DIAGNOSIS — H52203 Unspecified astigmatism, bilateral: Secondary | ICD-10-CM | POA: Diagnosis not present

## 2016-04-24 DIAGNOSIS — H524 Presbyopia: Secondary | ICD-10-CM | POA: Diagnosis not present

## 2016-04-24 DIAGNOSIS — H5203 Hypermetropia, bilateral: Secondary | ICD-10-CM | POA: Diagnosis not present

## 2016-05-07 ENCOUNTER — Other Ambulatory Visit: Payer: Self-pay | Admitting: Hematology and Oncology

## 2016-05-07 DIAGNOSIS — C50112 Malignant neoplasm of central portion of left female breast: Secondary | ICD-10-CM

## 2016-05-16 ENCOUNTER — Telehealth: Payer: Self-pay | Admitting: Family Medicine

## 2016-05-16 MED ORDER — SIMVASTATIN 40 MG PO TABS
40.0000 mg | ORAL_TABLET | Freq: Every evening | ORAL | 6 refills | Status: DC
Start: 2016-05-16 — End: 2016-12-18

## 2016-05-16 NOTE — Telephone Encounter (Signed)
Pt states she needs refill on simvastatin, [facility] [place]

## 2016-05-21 ENCOUNTER — Telehealth: Payer: Self-pay | Admitting: Hematology and Oncology

## 2016-05-21 ENCOUNTER — Ambulatory Visit (HOSPITAL_BASED_OUTPATIENT_CLINIC_OR_DEPARTMENT_OTHER): Payer: PPO | Admitting: Hematology and Oncology

## 2016-05-21 ENCOUNTER — Encounter: Payer: Self-pay | Admitting: Hematology and Oncology

## 2016-05-21 VITALS — BP 169/69 | HR 76 | Temp 98.4°F | Resp 18 | Ht <= 58 in | Wt 103.5 lb

## 2016-05-21 DIAGNOSIS — G609 Hereditary and idiopathic neuropathy, unspecified: Secondary | ICD-10-CM

## 2016-05-21 DIAGNOSIS — C50112 Malignant neoplasm of central portion of left female breast: Secondary | ICD-10-CM | POA: Diagnosis not present

## 2016-05-21 DIAGNOSIS — M8580 Other specified disorders of bone density and structure, unspecified site: Secondary | ICD-10-CM

## 2016-05-21 MED ORDER — FLUTICASONE PROPIONATE 50 MCG/ACT NA SUSP: 1.0000 | Freq: Every day | NASAL | 2 refills | Status: DC | PRN

## 2016-05-21 NOTE — Assessment & Plan Note (Signed)
Left breast invasive ductal carcinoma clinical stage IIA initially triple negative with a Ki-67 of 47% underwent neoadjuvant chemotherapy followed by left breast lumpectomy, residual 0.4 cm tumor but it was ER positive PR negative HER-2 negative, completed adjuvant radiation therapy and is currently on Arimidex 1 mg daily since May 2015  Arimidex-related toxicities: 1. Osteopenia taking calcium and vitamin D and weightbearing exercises 2. Intermittent hot flashes  Monitoring closely for toxicities  Breast cancer surveillance: 1. Mammogram 07/22/15 was normal 2. Breast exam 05/21/16 is normal 3. Bone density May 2015 revealed osteopenia next bone density will be in May 2017  Neuropathy related to prior breast cancer therapy: Off neurontin  Return to clinic in one year for follow-up

## 2016-05-21 NOTE — Telephone Encounter (Signed)
appt made and avs printed °

## 2016-05-21 NOTE — Progress Notes (Signed)
Patient Care Team: Midge Minium, MD as PCP - General (Family Medicine) Maisie Fus, MD as Consulting Physician (Obstetrics and Gynecology) Marcy Panning, MD as Consulting Physician (Oncology) Thea Silversmith, MD as Consulting Physician (Radiation Oncology) Nicholas Lose, MD as Consulting Physician (Hematology and Oncology)  DIAGNOSIS: Cancer of central portion of left female breast [facility])   Staging form: Breast, AJCC 7th Edition   - Clinical: Stage IIA (T2, N0, cM0) - Unsigned         Staging comments: Staged at breast conference 04/27/13    - Pathologic: No stage assigned - Unsigned  SUMMARY OF ONCOLOGIC HISTORY:   Cancer of central portion of left female breast ([place])   04/15/2013 Initial Diagnosis    Left breast biopsy invasive ductal carcinoma with papillary features intermediate to high-grade, ER negative PR negative HER-2 negative Ki-67 47%      04/25/2013 Breast MRI    Left breast 12:00 position 2.4 cm area of abnormality, no lymph nodes      05/13/2013 - 09/02/2013 Neo-Adjuvant Chemotherapy    Adriamycin and Cytoxan 4 cycles followed by Taxol and carboplatin completed 9 out of 12 stopped due to Neuropathy      10/17/2013 Surgery    Left breast lumpectomy: 0.4 cm residual invasive ductal carcinoma 1 sentinel lymph node negative ER 52%, ER 0%, HER-2 negative ratio 1.18      11/14/2013 - 12/30/2013 Radiation Therapy    Adjuvant radiation therapy      01/20/2014 -  Anti-estrogen oral therapy    Arimidex 1 mg daily plan is for 5 years       CHIEF COMPLIANT: follow-up on Arimidex therapy  INTERVAL HISTORY: Ellen Beltran is a 70 year old with above-mentioned history left breast cancer treated with lumpectomy radiation and is currently on Arimidex therapy. She is tolerating it extremely well. She has occasional hot flashes. Is taking calcium and vitamin D. Denies any lumps or nodules in the breast. She'll be due for mammogram in November. She'll also be due for a bone  density at the same time.  REVIEW OF SYSTEMS:   Constitutional: Denies fevers, chills or abnormal weight loss Eyes: Denies blurriness of vision Ears, nose, mouth, throat, and face: Denies mucositis or sore throat Respiratory: Denies cough, dyspnea or wheezes Cardiovascular: Denies palpitation, chest discomfort Gastrointestinal:  Denies nausea, heartburn or change in bowel habits Skin: Denies abnormal skin rashes Lymphatics: Denies new lymphadenopathy or easy bruising Neurological:Denies numbness, tingling or new weaknesses Behavioral/Psych: Mood is stable, no new changes  Extremities: No lower extremity edema Breast:  denies any pain or lumps or nodules in either breasts All other systems were reviewed with the patient and are negative.  I have reviewed the past medical history, past surgical history, social history and family history with the patient and they are unchanged from previous note.  ALLERGIES:  is allergic to codeine; morphine and related; and sulfa antibiotics.  MEDICATIONS:  Current Outpatient Prescriptions  Medication Sig Dispense Refill  . anastrozole (ARIMIDEX) 1 MG tablet TAKE 1 TABLET (1 MG TOTAL) BY MOUTH DAILY. 90 tablet 3  . Biotin (BIOTIN MAXIMUM STRENGTH) 10 MG TABS Take by mouth.    . Calcium Citrate-Vitamin D (CALCIUM CITRATE + D3 PO) Take by mouth daily.    . fluticasone (FLONASE) 50 MCG/ACT nasal spray Place 1 spray into both nostrils daily as needed. congestion 16 g 2  . Multiple Vitamins-Minerals (ALIVE WOMENS 50+ PO) Take 1 each by mouth daily.     . simvastatin (  ZOCOR) 40 MG tablet Take 1 tablet (40 mg total) by mouth every evening. 30 tablet 6   No current facility-administered medications for this visit.     PHYSICAL EXAMINATION: ECOG PERFORMANCE STATUS: 0 - Asymptomatic  Vitals:   05/21/16 0833  BP: (!) 169/69  Pulse: 76  Resp: 18  Temp: 98.4 F (36.9 C)   Filed Weights   05/21/16 0833  Weight: 103 lb 8 oz (46.9 kg)     GENERAL:alert, no distress and comfortable SKIN: skin color, texture, turgor are normal, no rashes or significant lesions EYES: normal, Conjunctiva are pink and non-injected, sclera clear OROPHARYNX:no exudate, no erythema and lips, buccal mucosa, and tongue normal  NECK: supple, thyroid normal size, non-tender, without nodularity LYMPH:  no palpable lymphadenopathy in the cervical, axillary or inguinal LUNGS: clear to auscultation and percussion with normal breathing effort HEART: regular rate & rhythm and no murmurs and no lower extremity edema ABDOMEN:abdomen soft, non-tender and normal bowel sounds MUSCULOSKELETAL:no cyanosis of digits and no clubbing  NEURO: alert & oriented x 3 with fluent speech, no focal motor/sensory deficits EXTREMITIES: No lower extremity edema BREAST: No palpable masses or nodules in either right or left breasts. No palpable axillary supraclavicular or infraclavicular adenopathy no breast tenderness or nipple discharge. (exam performed in the presence of a chaperone)  LABORATORY DATA:  I have reviewed the data as listed   Chemistry      Component Value Date/Time   NA 141 01/17/2016 1541   NA 143 03/23/2015 0912   K 4.3 01/17/2016 1541   K 4.6 03/23/2015 0912   CL 100 01/17/2016 1541   CO2 28 01/17/2016 1541   CO2 27 03/23/2015 0912   BUN 14 01/17/2016 1541   BUN 15.0 03/23/2015 0912   CREATININE 0.70 01/17/2016 1541   CREATININE 0.8 03/23/2015 0912   GLU 99 01/31/2015      Component Value Date/Time   CALCIUM 10.5 (H) 01/17/2016 1541   CALCIUM 10.3 03/23/2015 0912   ALKPHOS 83 01/17/2016 1541   ALKPHOS 96 03/23/2015 0912   AST 20 01/17/2016 1541   AST 20 03/23/2015 0912   ALT 18 01/17/2016 1541   ALT 22 03/23/2015 0912   BILITOT 0.5 01/17/2016 1541   BILITOT 0.42 03/23/2015 0912       Lab Results  Component Value Date   WBC 9.9 01/17/2016   HGB 12.9 01/17/2016   HCT 39.1 01/17/2016   MCV 89.9 01/17/2016   PLT 328 01/17/2016    NEUTROABS 5,049 01/17/2016     ASSESSMENT & PLAN:  Cancer of central portion of left female breast Left breast invasive ductal carcinoma clinical stage IIA initially triple negative with a Ki-67 of 47% underwent neoadjuvant chemotherapy followed by left breast lumpectomy, residual 0.4 cm tumor but it was ER positive PR negative HER-2 negative, completed adjuvant radiation therapy and is currently on Arimidex 1 mg daily since May 2015  Arimidex-related toxicities: 1. Osteopenia taking calcium and vitamin D and weightbearing exercises 2. Intermittent hot flashes  Monitoring closely for toxicities  Breast cancer surveillance: 1. Mammogram 07/22/15 was normal 2. Breast exam 05/21/16 is normal 3. Bone density May 2015 revealed osteopenia next bone density will be in May 2017  Neuropathy related to prior breast cancer therapy: Off neurontin  Return to clinic in one year for follow-up   Orders Placed This Encounter  Procedures  . DG Bone Density    Standing Status:   Future    Standing Expiration Date:   05/21/2017  Scheduling Instructions:     Please do it at same time as her annual mammogram    Order Specific Question:   Reason for Exam (SYMPTOM  OR DIAGNOSIS REQUIRED)    Answer:   Post menopausal evaluation for osteoporosis on anti estrogen therapy    Order Specific Question:   Preferred imaging location?    Answer:   [facility]   The patient has a good understanding of the overall plan. she agrees with it. she will call with any problems that may develop before the next visit here.   Rulon Eisenmenger, MD 05/21/16

## 2016-05-23 DIAGNOSIS — Z23 Encounter for immunization: Secondary | ICD-10-CM | POA: Diagnosis not present

## 2016-06-13 ENCOUNTER — Other Ambulatory Visit: Payer: Self-pay | Admitting: Hematology and Oncology

## 2016-06-13 DIAGNOSIS — Z853 Personal history of malignant neoplasm of breast: Secondary | ICD-10-CM

## 2016-06-30 DIAGNOSIS — Z6823 Body mass index (BMI) 23.0-23.9, adult: Secondary | ICD-10-CM | POA: Diagnosis not present

## 2016-06-30 DIAGNOSIS — Z01419 Encounter for gynecological examination (general) (routine) without abnormal findings: Secondary | ICD-10-CM | POA: Diagnosis not present

## 2016-07-30 ENCOUNTER — Ambulatory Visit
Admission: RE | Admit: 2016-07-30 | Discharge: 2016-07-30 | Disposition: A | Discharge status: Home / Self Care | Payer: PPO | Source: Ambulatory Visit | Attending: Hematology and Oncology | Admitting: Hematology and Oncology

## 2016-07-30 DIAGNOSIS — M8580 Other specified disorders of bone density and structure, unspecified site: Secondary | ICD-10-CM

## 2016-07-30 DIAGNOSIS — M810 Age-related osteoporosis without current pathological fracture: Secondary | ICD-10-CM | POA: Diagnosis not present

## 2016-07-30 DIAGNOSIS — R928 Other abnormal and inconclusive findings on diagnostic imaging of breast: Secondary | ICD-10-CM | POA: Diagnosis not present

## 2016-07-30 DIAGNOSIS — Z780 Asymptomatic menopausal state: Secondary | ICD-10-CM | POA: Diagnosis not present

## 2016-07-30 DIAGNOSIS — Z853 Personal history of malignant neoplasm of breast: Secondary | ICD-10-CM

## 2016-10-05 ENCOUNTER — Other Ambulatory Visit: Payer: Self-pay | Admitting: Hematology and Oncology

## 2016-10-05 DIAGNOSIS — C50112 Malignant neoplasm of central portion of left female breast: Secondary | ICD-10-CM

## 2016-10-14 ENCOUNTER — Telehealth: Payer: Self-pay

## 2016-10-14 NOTE — Telephone Encounter (Signed)
Pt called with concern about her bone density. She was told to call her oncologist. What meds does she need to start? She is wanting an appt to discuss this.\  inbasket sent

## 2016-10-16 ENCOUNTER — Telehealth: Payer: Self-pay

## 2016-10-16 NOTE — Telephone Encounter (Signed)
Pt wants to sit down and speak with Dr.Gudena regarding her bone density results. Pt is available anytime. Scheduled pt for Wed next week to come in to see Dr. Lindi Adie. Pt verbalized understanding.

## 2016-10-22 ENCOUNTER — Telehealth: Payer: Self-pay | Admitting: Family Medicine

## 2016-10-22 ENCOUNTER — Ambulatory Visit (HOSPITAL_BASED_OUTPATIENT_CLINIC_OR_DEPARTMENT_OTHER): Payer: PPO | Admitting: Hematology and Oncology

## 2016-10-22 ENCOUNTER — Encounter: Payer: Self-pay | Admitting: Hematology and Oncology

## 2016-10-22 DIAGNOSIS — C50112 Malignant neoplasm of central portion of left female breast: Secondary | ICD-10-CM | POA: Diagnosis not present

## 2016-10-22 DIAGNOSIS — M810 Age-related osteoporosis without current pathological fracture: Secondary | ICD-10-CM | POA: Diagnosis not present

## 2016-10-22 DIAGNOSIS — Z170 Estrogen receptor positive status [ER+]: Secondary | ICD-10-CM | POA: Diagnosis not present

## 2016-10-22 DIAGNOSIS — G609 Hereditary and idiopathic neuropathy, unspecified: Secondary | ICD-10-CM

## 2016-10-22 NOTE — Telephone Encounter (Signed)
prolia ordered

## 2016-10-22 NOTE — Progress Notes (Signed)
Patient Care Team: Midge Minium, MD as PCP - General (Family Medicine) Maisie Fus, MD as Consulting Physician (Obstetrics and Gynecology) Marcy Panning, MD as Consulting Physician (Oncology) Thea Silversmith, MD as Consulting Physician (Radiation Oncology) Nicholas Lose, MD as Consulting Physician (Hematology and Oncology)  DIAGNOSIS:  Encounter Diagnosis  Name Primary?  . Malignant neoplasm of central portion of left breast in female, estrogen receptor positive ([facility])     SUMMARY OF ONCOLOGIC HISTORY:   Cancer of central portion of left female breast ([place])   04/15/2013 Initial Diagnosis    Left breast biopsy invasive ductal carcinoma with papillary features intermediate to high-grade, ER negative PR negative HER-2 negative Ki-67 47%      04/25/2013 Breast MRI    Left breast 12:00 position 2.4 cm area of abnormality, no lymph nodes      05/13/2013 - 09/02/2013 Neo-Adjuvant Chemotherapy    Adriamycin and Cytoxan 4 cycles followed by Taxol and carboplatin completed 9 out of 12 stopped due to Neuropathy      10/17/2013 Surgery    Left breast lumpectomy: 0.4 cm residual invasive ductal carcinoma 1 sentinel lymph node negative ER 52%, ER 0%, HER-2 negative ratio 1.18      11/14/2013 - 12/30/2013 Radiation Therapy    Adjuvant radiation therapy      01/20/2014 -  Anti-estrogen oral therapy    Arimidex 1 mg daily plan is for 5 years       CHIEF COMPLIANT: follow-up on Arimidex therapy, new diagnosis of osteoporosis  INTERVAL HISTORY: Ellen Beltran is a 71 year old with above-mentioned history of left breast cancer who was treated with neoadjuvant chemotherapy 2014 followed by lumpectomy and radiation and is currently on Arimidex therapy since May 2015. She appears to be tolerating Arimidex extremely well. She does have moderate hot flashes and intermittent muscle and joint aches and pains. She does not attribute these muscle and joint pains to anastrozole. She thinks it is  related to arthritis as well as prior chemotherapy. She was recently diagnosed with osteoporosis with a T score of -2.9. From 2015 this has gotten markedly worse. She is here today accompanied by her husband discuss the treatment options for osteoporosis.  REVIEW OF SYSTEMS:   Constitutional: Denies fevers, chills or abnormal weight loss Eyes: Denies blurriness of vision Ears, nose, mouth, throat, and face: Denies mucositis or sore throat Respiratory: Denies cough, dyspnea or wheezes Cardiovascular: Denies palpitation, chest discomfort Gastrointestinal:  Denies nausea, heartburn or change in bowel habits Skin: Denies abnormal skin rashes Lymphatics: Denies new lymphadenopathy or easy bruising Neurological:Denies numbness, tingling or new weaknesses Behavioral/Psych: Mood is stable, no new changes  Extremities: right hip joint pain.No lower extremity edema Breast:  denies any pain or lumps or nodules in either breasts All other systems were reviewed with the patient and are negative.  I have reviewed the past medical history, past surgical history, social history and family history with the patient and they are unchanged from previous note.  ALLERGIES:  is allergic to codeine; morphine and related; and sulfa antibiotics.  MEDICATIONS:  Current Outpatient Prescriptions  Medication Sig Dispense Refill  . anastrozole (ARIMIDEX) 1 MG tablet TAKE 1 TABLET (1 MG TOTAL) BY MOUTH DAILY. 90 tablet 3  . Biotin (BIOTIN MAXIMUM STRENGTH) 10 MG TABS Take by mouth.    . Calcium Citrate-Vitamin D (CALCIUM CITRATE + D3 PO) Take by mouth daily.    . fluticasone (FLONASE) 50 MCG/ACT nasal spray Place 1 spray into both nostrils daily as  needed. congestion 16 g 2  . Multiple Vitamins-Minerals (ALIVE WOMENS 50+ PO) Take 1 each by mouth daily.     . simvastatin (ZOCOR) 40 MG tablet Take 1 tablet (40 mg total) by mouth every evening. 30 tablet 6   No current facility-administered medications for this visit.       PHYSICAL EXAMINATION: ECOG PERFORMANCE STATUS: 1 - Symptomatic but completely ambulatory  Vitals:   10/22/16 0820  BP: (!) 170/66  Pulse: 80  Resp: 18  Temp: 97.8 F (36.6 C)   Filed Weights   10/22/16 0820  Weight: 102 lb (46.3 kg)    GENERAL:alert, no distress and comfortable SKIN: skin color, texture, turgor are normal, no rashes or significant lesions EYES: normal, Conjunctiva are pink and non-injected, sclera clear OROPHARYNX:no exudate, no erythema and lips, buccal mucosa, and tongue normal  NECK: supple, thyroid normal size, non-tender, without nodularity LYMPH:  no palpable lymphadenopathy in the cervical, axillary or inguinal LUNGS: clear to auscultation and percussion with normal breathing effort HEART: regular rate & rhythm and no murmurs and no lower extremity edema ABDOMEN:abdomen soft, non-tender and normal bowel sounds MUSCULOSKELETAL:no cyanosis of digits and no clubbing  NEURO: alert & oriented x 3 with fluent speech, no focal motor/sensory deficits EXTREMITIES: No lower extremity edema   LABORATORY DATA:  I have reviewed the data as listed   Chemistry      Component Value Date/Time   NA 141 01/17/2016 1541   NA 143 03/23/2015 0912   K 4.3 01/17/2016 1541   K 4.6 03/23/2015 0912   CL 100 01/17/2016 1541   CO2 28 01/17/2016 1541   CO2 27 03/23/2015 0912   BUN 14 01/17/2016 1541   BUN 15.0 03/23/2015 0912   CREATININE 0.70 01/17/2016 1541   CREATININE 0.8 03/23/2015 0912   GLU 99 01/31/2015      Component Value Date/Time   CALCIUM 10.5 (H) 01/17/2016 1541   CALCIUM 10.3 03/23/2015 0912   ALKPHOS 83 01/17/2016 1541   ALKPHOS 96 03/23/2015 0912   AST 20 01/17/2016 1541   AST 20 03/23/2015 0912   ALT 18 01/17/2016 1541   ALT 22 03/23/2015 0912   BILITOT 0.5 01/17/2016 1541   BILITOT 0.42 03/23/2015 0912       Lab Results  Component Value Date   WBC 9.9 01/17/2016   HGB 12.9 01/17/2016   HCT 39.1 01/17/2016   MCV 89.9 01/17/2016    PLT 328 01/17/2016   NEUTROABS 5,049 01/17/2016    ASSESSMENT & PLAN:  Cancer of central portion of left female breast Left breast invasive ductal carcinoma clinical stage IIA initially triple negative with a Ki-67 of 47% underwent neoadjuvant chemotherapy followed by left breast lumpectomy, residual 0.4 cm tumor but it was ER positive PR negative HER-2 negative, completed adjuvant radiation therapy and is currently on Arimidex 1 mg daily since May 2015  Arimidex-related toxicities: 1. Osteoporosis: T score minus 2. 07/24/2016: I recommended that the patient receive bisphosphonate therapy. We discussed the pros and cons of Fosamax versus Prolia injections. Patient is leaning towards Prolia but wants to know what her out-of-pocket costs for the drug. She will discuss this with her primary care physician and make a final decision. She may receive this treatment whenever she pleases. 2. Intermittent hot flashes  Monitoring closely for toxicities  Breast cancer surveillance: 1. Mammogram 07/21/16 was normal 2. Breast exam 05/21/16 is normal 3. Bone density November 2017: Osteoporosis T score -2.9  Neuropathy related to prior breast cancer  therapy: Off neurontin  Return to clinic in one year for follow-up   I spent 25 minutes talking to the patient of which more than half was spent in counseling and coordination of care.  No orders of the defined types were placed in this encounter.  The patient has a good understanding of the overall plan. she agrees with it. she will call with any problems that may develop before the next visit here.   Rulon Eisenmenger, MD 10/22/16

## 2016-10-22 NOTE — Telephone Encounter (Signed)
Pt asking to start  prolia injections, please advise.

## 2016-10-22 NOTE — Assessment & Plan Note (Signed)
Left breast invasive ductal carcinoma clinical stage IIA initially triple negative with a Ki-67 of 47% underwent neoadjuvant chemotherapy followed by left breast lumpectomy, residual 0.4 cm tumor but it was ER positive PR negative HER-2 negative, completed adjuvant radiation therapy and is currently on Arimidex 1 mg daily since May 2015  Arimidex-related toxicities: 1. Osteoporosis: T score minus 2. 07/24/2016: I recommended that the patient receive bisphosphonate therapy. We discussed the pros and cons of Fosamax versus Prolia injections. Patient is leaning towards Prolia but wants to know what her out-of-pocket costs for the drug. She will discuss this with her primary care physician and make a final decision. She may receive this treatment whenever she pleases. 2. Intermittent hot flashes  Monitoring closely for toxicities  Breast cancer surveillance: 1. Mammogram 07/21/16 was normal 2. Breast exam 05/21/16 is normal 3. Bone density November 2017: Osteoporosis T score -2.9  Neuropathy related to prior breast cancer therapy: Off neurontin  Return to clinic in one year for follow-up

## 2016-10-30 ENCOUNTER — Encounter: Payer: Self-pay | Admitting: Family Medicine

## 2016-11-10 ENCOUNTER — Telehealth: Payer: Self-pay | Admitting: General Practice

## 2016-11-10 NOTE — Telephone Encounter (Signed)
Prolia authorization received, PA is needed, prolia is working on this. Will inform pt once received and PA is approved and prolia is in the office.

## 2016-11-13 ENCOUNTER — Telehealth: Payer: Self-pay | Admitting: Family Medicine

## 2016-11-13 ENCOUNTER — Other Ambulatory Visit: Payer: Self-pay | Admitting: General Practice

## 2016-11-13 MED ORDER — DENOSUMAB 60 MG/ML ~~LOC~~ SOLN: 60.0000 mg | Freq: Once | SUBCUTANEOUS | 0 refills | Status: AC

## 2016-11-13 NOTE — Telephone Encounter (Signed)
Patient requesting rx for prolia.  Per insurance prior authorization not required.  She would like to pick medication up from pharamcy and bring it in to office to have administered.

## 2016-11-13 NOTE — Telephone Encounter (Signed)
Spoke with pt, she was told by insurance that there is no PA if Rx for prolia was sent to pharmacy instead of having our office order it. Prolia sent to pt pharmacy.Ellen Beltran for pt to have nurse visit for prolia.

## 2016-11-26 ENCOUNTER — Telehealth: Payer: Self-pay | Admitting: Family Medicine

## 2016-11-26 NOTE — Telephone Encounter (Signed)
(  Voice mail message received on phone at front desk on 11/26/16 at 9:23am)  This is [facility] [employer] calling on behalf of Ellen Beltran.  Please call us back at [phone] regarding setting up delivery of Prolia.

## 2016-11-26 NOTE — Telephone Encounter (Signed)
[facility]. Pt prolia will be delivered tomorrow.

## 2016-11-28 ENCOUNTER — Ambulatory Visit (INDEPENDENT_AMBULATORY_CARE_PROVIDER_SITE_OTHER): Payer: PPO | Admitting: *Deleted

## 2016-11-28 ENCOUNTER — Telehealth: Payer: Self-pay | Admitting: Family Medicine

## 2016-11-28 DIAGNOSIS — M810 Age-related osteoporosis without current pathological fracture: Secondary | ICD-10-CM

## 2016-11-28 MED ORDER — DENOSUMAB 60 MG/ML ~~LOC~~ SOLN
60.0000 mg | Freq: Once | SUBCUTANEOUS | Status: AC
  Administered 2016-11-28: 60 mg via SUBCUTANEOUS

## 2016-11-28 NOTE — Telephone Encounter (Signed)
Medication was received, ok to schedule for prolia injection with nurse.

## 2016-11-28 NOTE — Telephone Encounter (Signed)
Appt scheduled 11/28/16 at 2pm, pt aware.

## 2016-11-28 NOTE — Telephone Encounter (Signed)
Patient wants to know if prolia injection came in yesterday.

## 2016-12-18 ENCOUNTER — Other Ambulatory Visit: Payer: Self-pay | Admitting: Family Medicine

## 2017-01-13 ENCOUNTER — Telehealth: Payer: Self-pay

## 2017-01-13 NOTE — Telephone Encounter (Signed)
LM requesting call back regarding AWV. Requesting patient to see [facility] for AWV on 01/20/17 @ 0800, prior to appt with PCP @ 9 for CPE.

## 2017-01-21 ENCOUNTER — Ambulatory Visit (INDEPENDENT_AMBULATORY_CARE_PROVIDER_SITE_OTHER): Payer: PPO | Admitting: Family Medicine

## 2017-01-21 ENCOUNTER — Encounter: Payer: Self-pay | Admitting: Family Medicine

## 2017-01-21 VITALS — BP 146/70 | HR 70 | Temp 98.3°F | Resp 20 | Ht <= 58 in | Wt 100.6 lb

## 2017-01-21 DIAGNOSIS — E785 Hyperlipidemia, unspecified: Secondary | ICD-10-CM

## 2017-01-21 DIAGNOSIS — Z0000 Encounter for general adult medical examination without abnormal findings: Secondary | ICD-10-CM

## 2017-01-21 DIAGNOSIS — M810 Age-related osteoporosis without current pathological fracture: Secondary | ICD-10-CM | POA: Diagnosis not present

## 2017-01-21 LAB — CBC WITH DIFFERENTIAL/PLATELET
BASOS ABS: 0.1 10*3/uL (ref 0.0–0.1)
Basophils Relative: 0.9 % (ref 0.0–3.0)
EOS ABS: 0.2 10*3/uL (ref 0.0–0.7)
Eosinophils Relative: 1.9 % (ref 0.0–5.0)
HEMATOCRIT: 39.9 % (ref 36.0–46.0)
Hemoglobin: 13.2 g/dL (ref 12.0–15.0)
LYMPHS ABS: 3 10*3/uL (ref 0.7–4.0)
Lymphocytes Relative: 34 % (ref 12.0–46.0)
MCHC: 33 g/dL (ref 30.0–36.0)
MCV: 90.7 fl (ref 78.0–100.0)
MONOS PCT: 8.1 % (ref 3.0–12.0)
Monocytes Absolute: 0.7 10*3/uL (ref 0.1–1.0)
NEUTROS ABS: 4.8 10*3/uL (ref 1.4–7.7)
Neutrophils Relative %: 55.1 % (ref 43.0–77.0)
Platelets: 341 10*3/uL (ref 150.0–400.0)
RBC: 4.4 Mil/uL (ref 3.87–5.11)
RDW: 14 % (ref 11.5–15.5)
WBC: 8.7 10*3/uL (ref 4.0–10.5)

## 2017-01-21 LAB — LIPID PANEL
CHOLESTEROL: 225 mg/dL — AB (ref 0–200)
HDL: 82 mg/dL (ref >39.00)
LDL CALC: 121 mg/dL — AB (ref 0–99)
NonHDL: 142.94
TRIGLYCERIDES: 110 mg/dL (ref 0.0–149.0)
Total CHOL/HDL Ratio: 3
VLDL: 22 mg/dL (ref 0.0–40.0)

## 2017-01-21 LAB — BASIC METABOLIC PANEL
BUN: 14 mg/dL (ref 6–23)
CALCIUM: 10.1 mg/dL (ref 8.4–10.5)
CO2: 33 meq/L — AB (ref 19–32)
Chloride: 102 mEq/L (ref 96–112)
Creatinine, Ser: 0.74 mg/dL (ref 0.40–1.20)
GFR: 82.59 mL/min (ref >60.00)
GLUCOSE: 97 mg/dL (ref 70–99)
POTASSIUM: 4.1 meq/L (ref 3.5–5.1)
SODIUM: 142 meq/L (ref 135–145)

## 2017-01-21 LAB — HEPATIC FUNCTION PANEL
ALBUMIN: 4.8 g/dL (ref 3.5–5.2)
ALK PHOS: 69 U/L (ref 39–117)
ALT: 18 U/L (ref 0–35)
AST: 19 U/L (ref 0–37)
BILIRUBIN DIRECT: 0.1 mg/dL (ref 0.0–0.3)
TOTAL PROTEIN: 7.3 g/dL (ref 6.0–8.3)
Total Bilirubin: 0.6 mg/dL (ref 0.2–1.2)

## 2017-01-21 LAB — TSH: TSH: 2.82 u[IU]/mL (ref 0.35–4.50)

## 2017-01-21 LAB — VITAMIN D 25 HYDROXY (VIT D DEFICIENCY, FRACTURES): VITD: 23.13 ng/mL — ABNORMAL LOW (ref 30.00–100.00)

## 2017-01-21 NOTE — Assessment & Plan Note (Signed)
Chronic problem.  UTD on DEXA.  Currently on Prolia.  Check Vit D and replete prn.

## 2017-01-21 NOTE — Progress Notes (Signed)
   Subjective:    Patient ID: Ellen Beltran, female    DOB: 11/02/1945, 71 y.o.   MRN: [id number]  HPI CPE- pt is UTD on colonoscopy, mammo/DEXA, immunizations.  Pt is going to ask insurance about Shingrix.     Review of Systems Patient reports no vision/ hearing changes, adenopathy,fever, weight change,  persistant/recurrent hoarseness , swallowing issues, chest pain, palpitations, edema, persistant/recurrent cough, hemoptysis, dyspnea (rest/exertional/paroxysmal nocturnal), gastrointestinal bleeding (melena, rectal bleeding), abdominal pain, significant heartburn, bowel changes, GU symptoms (dysuria, hematuria, incontinence), Gyn symptoms (abnormal  bleeding, pain),  syncope, focal weakness, memory loss, numbness & tingling, skin/hair/nail changes, abnormal bruising or bleeding, anxiety, or depression.     Objective:   Physical Exam General Appearance:    Alert, cooperative, no distress, appears stated age  Head:    Normocephalic, without obvious abnormality, atraumatic  Eyes:    PERRL, conjunctiva/corneas clear, EOM's intact, fundi    benign, both eyes  Ears:    Normal TM's and external ear canals, both ears  Nose:   Nares normal, septum midline, mucosa normal, no drainage    or sinus tenderness  Throat:   Lips, mucosa, and tongue normal; teeth and gums normal  Neck:   Supple, symmetrical, trachea midline, no adenopathy;    Thyroid: no enlargement/tenderness/nodules  Back:     Symmetric, no curvature, ROM normal, no CVA tenderness  Lungs:     Clear to auscultation bilaterally, respirations unlabored  Chest Wall:    No tenderness or deformity   Heart:    Regular rate and rhythm, S1 and S2 normal, no murmur, rub   or gallop  Breast Exam:    Deferred to mammo  Abdomen:     Soft, non-tender, bowel sounds active all four quadrants,    no masses, no organomegaly  Genitalia:    Deferred  Rectal:    Extremities:   Extremities normal, atraumatic, no cyanosis or edema  Pulses:   2+ and  symmetric all extremities  Skin:   Skin color, texture, turgor normal, no rashes or lesions  Lymph nodes:   Cervical, supraclavicular, and axillary nodes normal  Neurologic:   CNII-XII intact, normal strength, sensation and reflexes    throughout          Assessment & Plan:

## 2017-01-21 NOTE — Assessment & Plan Note (Signed)
Pt's PE WNL.  UTD on mammo, DEXA, colonoscopy, and immunizations.  Pt to ask insurance about Shingrix.  Written screening schedule updated and given to pt.  Check labs.  Anticipatory guidance provided.

## 2017-01-21 NOTE — Assessment & Plan Note (Signed)
Chronic problem.  Tolerating statin w/o difficulty.  Check labs.  Adjust meds prn  

## 2017-01-21 NOTE — Patient Instructions (Addendum)
Follow up in 6 months to recheck cholesterol and BP We'll notify you of your lab results and make any changes if needed Continue to work on healthy diet and regular exercise- you look great!!! You are up to date on mammogram until November- yay!! Up to date on colonoscopy until 2023!!! You are due for Prolia in September Call with any questions or concerns Happy Mother's Day!!!   Bring a copy of your advance directives to your next office visit.  Continue doing brain stimulating activities (puzzles, reading, adult coloring books, staying active) to keep memory sharp.   Call pharmacy regarding Shingrix vaccine.  Health Maintenance, Female Adopting a healthy lifestyle and getting preventive care can go a long way to promote health and wellness. Talk with your health care provider about what schedule of regular examinations is right for you. This is a good chance for you to check in with your provider about disease prevention and staying healthy. In between checkups, there are plenty of things you can do on your own. Experts have done a lot of research about which lifestyle changes and preventive measures are most likely to keep you healthy. Ask your health care provider for more information. Weight and diet Eat a healthy diet  Be sure to include plenty of vegetables, fruits, low-fat dairy products, and lean protein.  Do not eat a lot of foods high in solid fats, added sugars, or salt.  Get regular exercise. This is one of the most important things you can do for your health.  Most adults should exercise for at least 150 minutes each week. The exercise should increase your heart rate and make you sweat (moderate-intensity exercise).  Most adults should also do strengthening exercises at least twice a week. This is in addition to the moderate-intensity exercise. Maintain a healthy weight  Body mass index (BMI) is a measurement that can be used to identify possible weight problems. It  estimates body fat based on height and weight. Your health care provider can help determine your BMI and help you achieve or maintain a healthy weight.  For females 77 years of age and older:  A BMI below 18.5 is considered underweight.  A BMI of 18.5 to 24.9 is normal.  A BMI of 25 to 29.9 is considered overweight.  A BMI of 30 and above is considered obese. Watch levels of cholesterol and blood lipids  You should start having your blood tested for lipids and cholesterol at 70 years of age, then have this test every 5 years.  You may need to have your cholesterol levels checked more often if:  Your lipid or cholesterol levels are high.  You are older than 70 years of age.  You are at high risk for heart disease. Cancer screening Lung Cancer  Lung cancer screening is recommended for adults 46-79 years old who are at high risk for lung cancer because of a history of smoking.  A yearly low-dose CT scan of the lungs is recommended for people who:  Currently smoke.  Have quit within the past 15 years.  Have at least a 30-pack-year history of smoking. A pack year is smoking an average of one pack of cigarettes a day for 1 year.  Yearly screening should continue until it has been 15 years since you quit.  Yearly screening should stop if you develop a health problem that would prevent you from having lung cancer treatment. Breast Cancer  Practice breast self-awareness. This means understanding how your breasts normally  appear and feel.  It also means doing regular breast self-exams. Let your health care provider know about any changes, no matter how small.  If you are in your 20s or 30s, you should have a clinical breast exam (CBE) by a health care provider every 1-3 years as part of a regular health exam.  If you are 39 or older, have a CBE every year. Also consider having a breast X-ray (mammogram) every year.  If you have a family history of breast cancer, talk to your  health care provider about genetic screening.  If you are at high risk for breast cancer, talk to your health care provider about having an MRI and a mammogram every year.  Breast cancer gene (BRCA) assessment is recommended for women who have family members with BRCA-related cancers. BRCA-related cancers include:  Breast.  Ovarian.  Tubal.  Peritoneal cancers.  Results of the assessment will determine the need for genetic counseling and BRCA1 and BRCA2 testing. Cervical Cancer  Your health care provider may recommend that you be screened regularly for cancer of the pelvic organs (ovaries, uterus, and vagina). This screening involves a pelvic examination, including checking for microscopic changes to the surface of your cervix (Pap test). You may be encouraged to have this screening done every 3 years, beginning at age 61.  For women ages 38-65, health care providers may recommend pelvic exams and Pap testing every 3 years, or they may recommend the Pap and pelvic exam, combined with testing for human papilloma virus (HPV), every 5 years. Some types of HPV increase your risk of cervical cancer. Testing for HPV may also be done on women of any age with unclear Pap test results.  Other health care providers may not recommend any screening for nonpregnant women who are considered low risk for pelvic cancer and who do not have symptoms. Ask your health care provider if a screening pelvic exam is right for you.  If you have had past treatment for cervical cancer or a condition that could lead to cancer, you need Pap tests and screening for cancer for at least 20 years after your treatment. If Pap tests have been discontinued, your risk factors (such as having a new sexual partner) need to be reassessed to determine if screening should resume. Some women have medical problems that increase the chance of getting cervical cancer. In these cases, your health care provider may recommend more frequent  screening and Pap tests. Colorectal Cancer  This type of cancer can be detected and often prevented.  Routine colorectal cancer screening usually begins at 70 years of age and continues through 70 years of age.  Your health care provider may recommend screening at an earlier age if you have risk factors for colon cancer.  Your health care provider may also recommend using home test kits to check for hidden blood in the stool.  A small camera at the end of a tube can be used to examine your colon directly (sigmoidoscopy or colonoscopy). This is done to check for the earliest forms of colorectal cancer.  Routine screening usually begins at age 49.  Direct examination of the colon should be repeated every 5-10 years through 70 years of age. However, you may need to be screened more often if early forms of precancerous polyps or small growths are found. Skin Cancer  Check your skin from head to toe regularly.  Tell your health care provider about any new moles or changes in moles, especially if there  is a change in a mole's shape or color.  Also tell your health care provider if you have a mole that is larger than the size of a pencil eraser.  Always use sunscreen. Apply sunscreen liberally and repeatedly throughout the day.  Protect yourself by wearing long sleeves, pants, a wide-brimmed hat, and sunglasses whenever you are outside. Heart disease, diabetes, and high blood pressure  High blood pressure causes heart disease and increases the risk of stroke. High blood pressure is more likely to develop in:  People who have blood pressure in the high end of the normal range (130-139/85-89 mm Hg).  People who are overweight or obese.  People who are African American.  If you are 35-28 years of age, have your blood pressure checked every 3-5 years. If you are 80 years of age or older, have your blood pressure checked every year. You should have your blood pressure measured twice-once  when you are at a hospital or clinic, and once when you are not at a hospital or clinic. Record the average of the two measurements. To check your blood pressure when you are not at a hospital or clinic, you can use:  An automated blood pressure machine at a pharmacy.  A home blood pressure monitor.  If you are between 72 years and 38 years old, ask your health care provider if you should take aspirin to prevent strokes.  Have regular diabetes screenings. This involves taking a blood sample to check your fasting blood sugar level.  If you are at a normal weight and have a low risk for diabetes, have this test once every three years after 70 years of age.  If you are overweight and have a high risk for diabetes, consider being tested at a younger age or more often. Preventing infection Hepatitis B  If you have a higher risk for hepatitis B, you should be screened for this virus. You are considered at high risk for hepatitis B if:  You were born in a country where hepatitis B is common. Ask your health care provider which countries are considered high risk.  Your parents were born in a high-risk country, and you have not been immunized against hepatitis B (hepatitis B vaccine).  You have HIV or AIDS.  You use needles to inject street drugs.  You live with someone who has hepatitis B.  You have had sex with someone who has hepatitis B.  You get hemodialysis treatment.  You take certain medicines for conditions, including cancer, organ transplantation, and autoimmune conditions. Hepatitis C  Blood testing is recommended for:  Everyone born from [birth year] through 1964.  Anyone with known risk factors for hepatitis C. Sexually transmitted infections (STIs)  You should be screened for sexually transmitted infections (STIs) including gonorrhea and chlamydia if:  You are sexually active and are younger than 70 years of age.  You are older than 70 years of age and your health care  provider tells you that you are at risk for this type of infection.  Your sexual activity has changed since you were last screened and you are at an increased risk for chlamydia or gonorrhea. Ask your health care provider if you are at risk.  If you do not have HIV, but are at risk, it may be recommended that you take a prescription medicine daily to prevent HIV infection. This is called pre-exposure prophylaxis (PrEP). You are considered at risk if:  You are sexually active and do not regularly use  condoms or know the HIV status of your partner(s).  You take drugs by injection.  You are sexually active with a partner who has HIV. Talk with your health care provider about whether you are at high risk of being infected with HIV. If you choose to begin PrEP, you should first be tested for HIV. You should then be tested every 3 months for as long as you are taking PrEP. Pregnancy  If you are premenopausal and you may become pregnant, ask your health care provider about preconception counseling.  If you may become pregnant, take 400 to 800 micrograms (mcg) of folic acid every day.  If you want to prevent pregnancy, talk to your health care provider about birth control (contraception). Osteoporosis and menopause  Osteoporosis is a disease in which the bones lose minerals and strength with aging. This can result in serious bone fractures. Your risk for osteoporosis can be identified using a bone density scan.  If you are 63 years of age or older, or if you are at risk for osteoporosis and fractures, ask your health care provider if you should be screened.  Ask your health care provider whether you should take a calcium or vitamin D supplement to lower your risk for osteoporosis.  Menopause may have certain physical symptoms and risks.  Hormone replacement therapy may reduce some of these symptoms and risks. Talk to your health care provider about whether hormone replacement therapy is right  for you. Follow these instructions at home:  Schedule regular health, dental, and eye exams.  Stay current with your immunizations.  Do not use any tobacco products including cigarettes, chewing tobacco, or electronic cigarettes.  If you are pregnant, do not drink alcohol.  If you are breastfeeding, limit how much and how often you drink alcohol.  Limit alcohol intake to no more than 1 drink per day for nonpregnant women. One drink equals 12 ounces of beer, 5 ounces of wine, or 1 ounces of hard liquor.  Do not use street drugs.  Do not share needles.  Ask your health care provider for help if you need support or information about quitting drugs.  Tell your health care provider if you often feel depressed.  Tell your health care provider if you have ever been abused or do not feel safe at home. This information is not intended to replace advice given to you by your health care provider. Make sure you discuss any questions you have with your health care provider. Document Released: 03/17/2011 Document Revised: 02/07/2016 Document Reviewed: 06/05/2015 Elsevier Interactive Patient Education  2017 [employer].

## 2017-01-21 NOTE — Progress Notes (Addendum)
Subjective:   Ellen Beltran is a 71 y.o. female who presents for Medicare Annual (Subsequent) preventive examination.  Review of Systems:  No ROS.  Medicare Wellness Visit.  Cardiac Risk Factors include: advanced age (>91men, >30 women);dyslipidemia;family history of premature cardiovascular disease   Sleep patterns: Sleeps approx 7 hours, up to void x 1. Feels rested.  Home Safety/Smoke Alarms:  Smoke detectors in place.  Living environment; residence and Firearm Safety: Lives with husband in 1 story home, steps at door with rail. Feels safe in home. Firearms locked away. Family lives close.  Seat Belt Safety/Bike Helmet: Wears seat belt.   Counseling:   Eye Exam-Last exam 04/2016,yearly by Dr. Rolene Arbour exam 09/2016, every 6 months by Dr. Lavone Neri  Female:   Pap-N/A      Mammo-07/30/2016, benign.      Dexa scan-07/30/2016, Osteoporosis. Prolia injections.   CCS-06/22/2012, normal. Recall 10 years.       Objective:     Vitals: BP (!) 146/70 (BP Location: Right Arm, Patient Position: Sitting, Cuff Size: Normal)   Pulse 70   Temp 98.3 F (36.8 C) (Oral)   Resp 20   Ht 4\' 10"  (1.473 m)   Wt 100 lb 9.6 oz (45.6 kg)   SpO2 99%   BMI 21.03 kg/m   Body mass index is 21.03 kg/m.   Tobacco History  Smoking Status  . Never Smoker  Smokeless Tobacco  . Never Used     Counseling given: Not Answered   Past Medical History:  Diagnosis Date  . Arthritis   . Cancer [facility])    left breast  . Dental bridge present    left lower  . H/O mumps   . History of blood transfusion 09/24/2013  . History of chemotherapy    stopped 08/2013  . History of chicken pox   . Hyperlipidemia   . Neuropathy due to chemotherapeutic drug ([facility])    hands and feet  . Osteopenia    Past Surgical History:  Procedure Laterality Date  . BREAST LUMPECTOMY WITH NEEDLE LOCALIZATION AND AXILLARY SENTINEL LYMPH NODE BX Left 10/17/2013   Procedure: BREAST LUMPECTOMY WITH NEEDLE  LOCALIZATION AND AXILLARY SENTINEL LYMPH NODE BX;  Surgeon: Shann Medal, MD;  Location: [facility];  Service: General;  Laterality: Left;  . CARDIAC CATHETERIZATION  08/31/2003   no disease  . COLONOSCOPY WITH PROPOFOL  06/22/2012  . PELVIC FLOOR REPAIR  05/06/2001   cardinal uterosacral colposuspension  . PORTACATH PLACEMENT N/A 05/02/2013   Procedure: INSERTION PORT-A-CATH;  Surgeon: Shann Medal, MD;  Location: [facility];  Service: General;  Laterality: N/A;  . TOTAL ABDOMINAL HYSTERECTOMY W/ BILATERAL SALPINGOOPHORECTOMY  05/06/2001  . TUBAL LIGATION  age 48   Family History  Problem Relation Age of Onset  . Heart disease Father   . Heart disease Brother   . Diabetes Brother   . Lung cancer Mother   . Thyroid disease Sister   . Thyroid disease Sister   . Thyroid disease Sister   . Cancer Maternal Uncle     pancreatic  . Cancer Maternal Uncle     colon   History  Sexual Activity  . Sexual activity: Yes    Outpatient Encounter Prescriptions as of 01/21/2017  Medication Sig  . anastrozole (ARIMIDEX) 1 MG tablet TAKE 1 TABLET (1 MG TOTAL) BY MOUTH DAILY.  . Calcium Citrate-Vitamin D (CALCIUM CITRATE + D3 PO) Take by mouth daily.  . fluticasone (FLONASE) 50 MCG/ACT  nasal spray Place 1 spray into both nostrils daily as needed. congestion  . Multiple Vitamins-Minerals (ALIVE WOMENS 50+ PO) Take 1 each by mouth daily.   Marland Kitchen PROLIA 60 MG/ML SOLN injection   . simvastatin (ZOCOR) 40 MG tablet TAKE 1 TABLET (40 MG TOTAL) BY MOUTH EVERY EVENING.  . Biotin (BIOTIN MAXIMUM STRENGTH) 10 MG TABS Take by mouth.   No facility-administered encounter medications on file as of 01/21/2017.     Activities of Daily Living In your present state of health, do you have any difficulty performing the following activities: 01/21/2017  Hearing? N  Vision? N  Difficulty concentrating or making decisions? N  Walking or climbing stairs? N  Dressing or bathing? N  Doing errands, shopping? N    Preparing Food and eating ? N  Using the Toilet? N  In the past six months, have you accidently leaked urine? N  Do you have problems with loss of bowel control? N  Managing your Medications? N  Managing your Finances? N  Housekeeping or managing your Housekeeping? N  Some recent data might be hidden    Patient Care Team: Midge Minium, MD as PCP - General (Family Medicine) Thea Silversmith, MD (Inactive) as Consulting Physician (Radiation Oncology) Nicholas Lose, MD as Consulting Physician (Hematology and Oncology)    Assessment:    Physical assessment deferred to PCP.  Exercise Activities and Dietary recommendations Current Exercise Habits: Home exercise routine, Type of exercise: Other - see comments;walking (cycle stand), Time (Minutes): 20, Frequency (Times/Week): 2, Weekly Exercise (Minutes/Week): 40, Exercise limited by: None identified   Diet (meal preparation, eat out, water intake, caffeinated beverages, dairy products, fruits and vegetables): Drinks green tea (citrus tea), water and tea.   Breakfast: belvita bars, grits/oatmeal, cereal Lunch: cheese, crackers, fruit, salad Dinner: lean meat, vegetables  Encouraged to continue healthy lifestyle.   Goals      Patient Stated   . maintain (pt-stated)          Maintain current health by continuing to eat healthy and being active.       Fall Risk Fall Risk  01/21/2017 01/17/2016 09/22/2014 09/22/2014 05/11/2014  Falls in the past year? No No Yes No No  Number falls in past yr: - - 1 - -  Injury with Fall? - - No - -   Depression Screen PHQ 2/9 Scores 01/21/2017 01/17/2016  PHQ - 2 Score 0 0     Cognitive Function       Ad8 score reviewed for issues:  Issues making decisions:no  Less interest in hobbies / activities: no  Repeats questions, stories (family complaining): no  Trouble using ordinary gadgets (microwave, computer, phone): no  Forgets the month or year: no  Mismanaging finances:  no  Remembering appts: no  Daily problems with thinking and/or memory: no Ad8 score is=0     Immunization History  Administered Date(s) Administered  . Influenza Split 06/09/2014  . Influenza,inj,Quad PF,36+ Mos 07/29/2013  . Pneumococcal Conjugate-13 02/27/2014  . Pneumococcal Polysaccharide-23 02/06/2015  . Tdap 01/02/2016  . Zoster 03/17/2014   Screening Tests Health Maintenance  Topic Date Due  . INFLUENZA VACCINE  04/15/2017  . MAMMOGRAM  07/30/2017  . COLONOSCOPY  06/22/2022  . TETANUS/TDAP  01/01/2026  . DEXA SCAN  Completed  . Hepatitis C Screening  Completed  . PNA vac Low Risk Adult  Completed      Plan:    Bring a copy of your advance directives to your next office visit.  Continue doing brain stimulating activities (puzzles, reading, adult coloring books, staying active) to keep memory sharp.   Call pharmacy regarding Shingrix vaccine.  Emmi education assigned via portal: Advance Directives  I have personally reviewed and noted the following in the patient's chart:   . Medical and social history . Use of alcohol, tobacco or illicit drugs  . Current medications and supplements . Functional ability and status . Nutritional status . Physical activity . Advanced directives . List of other physicians . Hospitalizations, surgeries, and ER visits in previous 12 months . Vitals . Screenings to include cognitive, depression, and falls . Referrals and appointments  In addition, I have reviewed and discussed with patient certain preventive protocols, quality metrics, and best practice recommendations. A written personalized care plan for preventive services as well as general preventive health recommendations were provided to patient.     Gerilyn Nestle, RN  01/21/2017  Reviewed documentation as provided above.  Annye Asa, MD

## 2017-01-22 ENCOUNTER — Other Ambulatory Visit: Payer: Self-pay | Admitting: General Practice

## 2017-01-22 MED ORDER — VITAMIN D (ERGOCALCIFEROL) 1.25 MG (50000 UNIT) PO CAPS: 50000.0000 [IU] | ORAL_CAPSULE | ORAL | 0 refills | Status: DC

## 2017-03-24 DIAGNOSIS — N6452 Nipple discharge: Secondary | ICD-10-CM | POA: Diagnosis not present

## 2017-03-24 DIAGNOSIS — Z853 Personal history of malignant neoplasm of breast: Secondary | ICD-10-CM | POA: Diagnosis not present

## 2017-04-13 ENCOUNTER — Other Ambulatory Visit: Payer: Self-pay | Admitting: Family Medicine

## 2017-05-19 ENCOUNTER — Telehealth: Payer: Self-pay | Admitting: Family Medicine

## 2017-05-19 MED ORDER — PROLIA 60 MG/ML ~~LOC~~ SOLN: 60.0000 mg | SUBCUTANEOUS | 2 refills | Status: DC

## 2017-05-19 NOTE — Telephone Encounter (Signed)
[name] with [facility] states that the reorder form is not what is needed for pt to get her prolia, [name] states that they need an Rx for this. Fax # [phone].

## 2017-05-19 NOTE — Telephone Encounter (Signed)
Patient states [facility] is awaiting order from pcp for prolia injection before they will ship the medication to the office.    She also states she signed a "Medicare Form" for the shot while she was in the office last week.  She wants to know if this form was for the insurance to cover the injection.  If not, what reason did she sign the form.

## 2017-05-19 NOTE — Telephone Encounter (Signed)
Noted rx sent.

## 2017-05-19 NOTE — Telephone Encounter (Signed)
Information has been given to Luttrell B, CMA. She has handled the prescription and sent it in to the pharmacy.

## 2017-05-20 ENCOUNTER — Ambulatory Visit: Payer: PPO | Admitting: Hematology and Oncology

## 2017-06-01 ENCOUNTER — Ambulatory Visit (INDEPENDENT_AMBULATORY_CARE_PROVIDER_SITE_OTHER): Payer: PPO | Admitting: *Deleted

## 2017-06-01 DIAGNOSIS — M810 Age-related osteoporosis without current pathological fracture: Secondary | ICD-10-CM

## 2017-06-01 MED ORDER — DENOSUMAB 60 MG/ML ~~LOC~~ SOLN
60.0000 mg | Freq: Once | SUBCUTANEOUS | Status: AC
  Administered 2017-06-01: 60 mg via SUBCUTANEOUS

## 2017-06-16 ENCOUNTER — Encounter: Payer: Self-pay | Admitting: General Practice

## 2017-06-16 ENCOUNTER — Telehealth: Payer: Self-pay | Admitting: *Deleted

## 2017-06-16 DIAGNOSIS — Z170 Estrogen receptor positive status [ER+]: Principal | ICD-10-CM

## 2017-06-16 DIAGNOSIS — C50112 Malignant neoplasm of central portion of left female breast: Secondary | ICD-10-CM

## 2017-06-16 NOTE — Telephone Encounter (Signed)
mammo ordered and pt informed.

## 2017-06-16 NOTE — Telephone Encounter (Signed)
Pt has a history of breast cancer, should this be TOMO diagnostic bilateral? This is what was ordered by oncology last year. Pt is not due for a DEXA per chart.

## 2017-06-16 NOTE — Addendum Note (Signed)
Addended by: Davis Gourd on: 06/16/2017 01:37 PM   Modules accepted: Orders

## 2017-06-16 NOTE — Telephone Encounter (Signed)
I would recommend ordering the TOMO diagnostic.

## 2017-06-16 NOTE — Telephone Encounter (Signed)
Patient is wanting an order for mammogram at [facility]. Patient is also asking if she needs another bone density.      Patient asks that she be sent a mychart message when the ordering is done so that she can call the imaging center to get scheduled.

## 2017-06-19 ENCOUNTER — Other Ambulatory Visit: Payer: Self-pay | Admitting: Family Medicine

## 2017-06-22 ENCOUNTER — Other Ambulatory Visit: Payer: Self-pay | Admitting: General Practice

## 2017-06-22 MED ORDER — SIMVASTATIN 40 MG PO TABS: 40.0000 mg | ORAL_TABLET | Freq: Every evening | ORAL | 1 refills | Status: DC

## 2017-07-22 ENCOUNTER — Encounter: Payer: Self-pay | Admitting: General Practice

## 2017-07-22 ENCOUNTER — Ambulatory Visit: Payer: PPO | Admitting: Family Medicine

## 2017-07-22 ENCOUNTER — Encounter: Payer: Self-pay | Admitting: Family Medicine

## 2017-07-22 VITALS — BP 140/78 | HR 81 | Resp 16 | Ht <= 58 in | Wt 101.0 lb

## 2017-07-22 DIAGNOSIS — I10 Essential (primary) hypertension: Secondary | ICD-10-CM | POA: Diagnosis not present

## 2017-07-22 DIAGNOSIS — E785 Hyperlipidemia, unspecified: Secondary | ICD-10-CM | POA: Diagnosis not present

## 2017-07-22 LAB — LIPID PANEL
CHOL/HDL RATIO: 3
Cholesterol: 223 mg/dL — ABNORMAL HIGH (ref 0–200)
HDL: 76.6 mg/dL (ref >39.00)
LDL CALC: 128 mg/dL — AB (ref 0–99)
NonHDL: 146.79
TRIGLYCERIDES: 96 mg/dL (ref 0.0–149.0)
VLDL: 19.2 mg/dL (ref 0.0–40.0)

## 2017-07-22 LAB — BASIC METABOLIC PANEL
BUN: 16 mg/dL (ref 6–23)
CHLORIDE: 103 meq/L (ref 96–112)
CO2: 30 mEq/L (ref 19–32)
CREATININE: 0.75 mg/dL (ref 0.40–1.20)
Calcium: 9.7 mg/dL (ref 8.4–10.5)
GFR: 81.2 mL/min (ref >60.00)
Glucose, Bld: 102 mg/dL — ABNORMAL HIGH (ref 70–99)
Potassium: 4 mEq/L (ref 3.5–5.1)
Sodium: 141 mEq/L (ref 135–145)

## 2017-07-22 LAB — HEPATIC FUNCTION PANEL
ALBUMIN: 4.5 g/dL (ref 3.5–5.2)
ALT: 17 U/L (ref 0–35)
AST: 16 U/L (ref 0–37)
Alkaline Phosphatase: 59 U/L (ref 39–117)
Bilirubin, Direct: 0.1 mg/dL (ref 0.0–0.3)
Total Bilirubin: 0.5 mg/dL (ref 0.2–1.2)
Total Protein: 7.1 g/dL (ref 6.0–8.3)

## 2017-07-22 LAB — CBC WITH DIFFERENTIAL/PLATELET
BASOS ABS: 0.1 10*3/uL (ref 0.0–0.1)
Basophils Relative: 0.8 % (ref 0.0–3.0)
EOS ABS: 0.1 10*3/uL (ref 0.0–0.7)
EOS PCT: 1.2 % (ref 0.0–5.0)
HCT: 40.4 % (ref 36.0–46.0)
HEMOGLOBIN: 13.3 g/dL (ref 12.0–15.0)
Lymphocytes Relative: 33.2 % (ref 12.0–46.0)
Lymphs Abs: 2.8 10*3/uL (ref 0.7–4.0)
MCHC: 32.8 g/dL (ref 30.0–36.0)
MCV: 92.4 fl (ref 78.0–100.0)
MONO ABS: 0.7 10*3/uL (ref 0.1–1.0)
Monocytes Relative: 8.4 % (ref 3.0–12.0)
Neutro Abs: 4.8 10*3/uL (ref 1.4–7.7)
Neutrophils Relative %: 56.4 % (ref 43.0–77.0)
Platelets: 324 10*3/uL (ref 150.0–400.0)
RBC: 4.38 Mil/uL (ref 3.87–5.11)
RDW: 13.9 % (ref 11.5–15.5)
WBC: 8.6 10*3/uL (ref 4.0–10.5)

## 2017-07-22 LAB — TSH: TSH: 2.88 u[IU]/mL (ref 0.35–4.50)

## 2017-07-22 NOTE — Patient Instructions (Signed)
Schedule your complete physical and Medicare Wellness Visit in 6 months We'll notify you of your lab results and make any changes if needed Keep up the good work on healthy diet and regular exercise- you look great! Add 2,000-5,000 units Vit D daily OTC Call with any questions or concerns [place]!!!

## 2017-07-22 NOTE — Progress Notes (Signed)
   Subjective:    Patient ID: Ellen Beltran, female    DOB: Nov 24, 1945, 71 y.o.   MRN: [id number]  HPI HTN- pt has a hx of this and was previously on Lisinopril daily.  This was stopped during her chemo due to low BP readings.  This AM BP was 155/95 and pt had mild HA.  No dizziness.  No CP, SOB.  + family hx of CAD and HTN.  Hyperlipidemia- chronic problem, on Simvastatin daily.  No abd pain, N/V, myalgias.   Review of Systems For ROS see HPI     Objective:   Physical Exam  Constitutional: She is oriented to person, place, and time. She appears well-developed and well-nourished. No distress.  HENT:  Head: Normocephalic and atraumatic.  Eyes: Conjunctivae and EOM are normal. Pupils are equal, round, and reactive to light.  Neck: Normal range of motion. Neck supple. No thyromegaly present.  Cardiovascular: Normal rate, regular rhythm, normal heart sounds and intact distal pulses.  No murmur heard. Pulmonary/Chest: Effort normal and breath sounds normal. No respiratory distress.  Abdominal: Soft. She exhibits no distension. There is no tenderness.  Musculoskeletal: She exhibits no edema.  Lymphadenopathy:    She has no cervical adenopathy.  Neurological: She is alert and oriented to person, place, and time.  Skin: Skin is warm and dry.  Psychiatric: She has a normal mood and affect. Her behavior is normal.  Vitals reviewed.         Assessment & Plan:

## 2017-07-22 NOTE — Assessment & Plan Note (Addendum)
Chronic problem.  Pt stopped all meds during her cancer tx due to hypotensive episodes.  She is resistant to starting meds 'unless absolutely necessary'.  Repeat BP was unchanged.  Encouraged her to  Monitor her BP at home and if consistently >140/90 she is to call so we can start HCTZ daily.  Pt expressed understanding and is in agreement w/ plan.  Will continue to follow.

## 2017-07-22 NOTE — Assessment & Plan Note (Signed)
Chronic problem.  Tolerating statin w/o difficulty.  Check labs.  Adjust meds prn  

## 2017-08-03 ENCOUNTER — Ambulatory Visit
Admission: RE | Admit: 2017-08-03 | Discharge: 2017-08-03 | Disposition: A | Discharge status: Home / Self Care | Payer: PPO | Source: Ambulatory Visit | Attending: Family Medicine | Admitting: Family Medicine

## 2017-08-03 DIAGNOSIS — Z170 Estrogen receptor positive status [ER+]: Principal | ICD-10-CM

## 2017-08-03 DIAGNOSIS — C50112 Malignant neoplasm of central portion of left female breast: Secondary | ICD-10-CM

## 2017-08-03 DIAGNOSIS — R928 Other abnormal and inconclusive findings on diagnostic imaging of breast: Secondary | ICD-10-CM | POA: Diagnosis not present

## 2017-08-03 HISTORY — DX: Personal history of antineoplastic chemotherapy: Z92.21

## 2017-08-03 HISTORY — DX: Personal history of irradiation: Z92.3

## 2017-09-15 HISTORY — PX: ANKLE ARTHROSCOPY WITH RECONSTRUCTION: SHX5583

## 2017-09-21 ENCOUNTER — Other Ambulatory Visit: Payer: Self-pay | Admitting: Hematology and Oncology

## 2017-09-21 DIAGNOSIS — C50112 Malignant neoplasm of central portion of left female breast: Secondary | ICD-10-CM

## 2017-09-30 ENCOUNTER — Other Ambulatory Visit: Payer: Self-pay

## 2017-09-30 ENCOUNTER — Ambulatory Visit (INDEPENDENT_AMBULATORY_CARE_PROVIDER_SITE_OTHER): Payer: Medicare HMO | Admitting: Family Medicine

## 2017-09-30 ENCOUNTER — Encounter: Payer: Self-pay | Admitting: Family Medicine

## 2017-09-30 VITALS — BP 130/78 | HR 74 | Temp 98.0°F | Resp 16 | Ht <= 58 in | Wt 102.4 lb

## 2017-09-30 DIAGNOSIS — M65341 Trigger finger, right ring finger: Secondary | ICD-10-CM

## 2017-09-30 DIAGNOSIS — M21941 Unspecified acquired deformity of hand, right hand: Secondary | ICD-10-CM

## 2017-09-30 DIAGNOSIS — M79641 Pain in right hand: Secondary | ICD-10-CM | POA: Diagnosis not present

## 2017-09-30 MED ORDER — MELOXICAM 7.5 MG PO TABS: 7.5000 mg | ORAL_TABLET | Freq: Every day | ORAL | 1 refills | Status: DC

## 2017-09-30 NOTE — Patient Instructions (Signed)
Follow up as needed or as scheduled We'll notify you of your lab results and make any changes if needed Start the Meloxicam once daily- take w/ food- for pain and inflammation.  Avoid other anti-inflammatory medications such as Advil, Aleve, Ibuprofen, etc- but you can take tylenol We'll call you with your hand appt Call with any questions or concerns Happy New Year!

## 2017-09-30 NOTE — Progress Notes (Signed)
   Subjective:    Patient ID: Ellen Beltran, female    DOB: 03-15-1946, 72 y.o.   MRN: [id number]  HPI Hand pain- R hand ring finger is locking and it is painful to pull it upward to unlock it.  sxs started 3-4 weeks ago.  Has been taking Advil w/o relief.  + PIP joint deformity of middle and ring fingers on the R hand.  Some deviation of fingers.   Review of Systems For ROS see HPI     Objective:   Physical Exam  Constitutional: She is oriented to person, place, and time. She appears well-developed and well-nourished. No distress.  Cardiovascular: Intact distal pulses.  Musculoskeletal: She exhibits tenderness (TTP over PIP joints and MCP joints of R hand) and deformity (deformity of R middle and ring fingers at PIP joints w/ TTP, deviation of fingers 3-5). She exhibits no edema.  Neurological: She is alert and oriented to person, place, and time.  Skin: Skin is warm and dry.  Psychiatric: She has a normal mood and affect. Her behavior is normal. Thought content normal.  Vitals reviewed.         Assessment & Plan:  R hand pain/deformity/trigger finger- new.  Pt reports the trigger finger started 3-4 weeks and this is very painful for her.  She has deformity of the PIP joints and a lot of pain.  Check labs to r/o RA.  Start daily NSAID to get ahead of the pain.  Refer to hand specialist for complete evaluation.  Reviewed supportive care and red flags that should prompt return.  Pt expressed understanding and is in agreement w/ plan.

## 2017-10-02 LAB — ANA: Anti Nuclear Antibody(ANA): NEGATIVE

## 2017-10-02 LAB — RHEUMATOID FACTOR

## 2017-10-15 DIAGNOSIS — M19042 Primary osteoarthritis, left hand: Secondary | ICD-10-CM | POA: Diagnosis not present

## 2017-10-15 DIAGNOSIS — M65341 Trigger finger, right ring finger: Secondary | ICD-10-CM | POA: Diagnosis not present

## 2017-10-15 DIAGNOSIS — M19041 Primary osteoarthritis, right hand: Secondary | ICD-10-CM | POA: Diagnosis not present

## 2017-10-15 DIAGNOSIS — M19049 Primary osteoarthritis, unspecified hand: Secondary | ICD-10-CM | POA: Insufficient documentation

## 2017-10-19 ENCOUNTER — Telehealth: Payer: Self-pay | Admitting: Hematology and Oncology

## 2017-10-19 ENCOUNTER — Inpatient Hospital Stay: Payer: Medicare HMO | Attending: Hematology and Oncology | Admitting: Hematology and Oncology

## 2017-10-19 DIAGNOSIS — Z885 Allergy status to narcotic agent status: Secondary | ICD-10-CM | POA: Diagnosis not present

## 2017-10-19 DIAGNOSIS — C50112 Malignant neoplasm of central portion of left female breast: Secondary | ICD-10-CM | POA: Diagnosis not present

## 2017-10-19 DIAGNOSIS — Z170 Estrogen receptor positive status [ER+]: Secondary | ICD-10-CM

## 2017-10-19 DIAGNOSIS — M810 Age-related osteoporosis without current pathological fracture: Secondary | ICD-10-CM | POA: Diagnosis not present

## 2017-10-19 DIAGNOSIS — Z79899 Other long term (current) drug therapy: Secondary | ICD-10-CM | POA: Diagnosis not present

## 2017-10-19 DIAGNOSIS — Z923 Personal history of irradiation: Secondary | ICD-10-CM | POA: Insufficient documentation

## 2017-10-19 DIAGNOSIS — T451X5S Adverse effect of antineoplastic and immunosuppressive drugs, sequela: Secondary | ICD-10-CM

## 2017-10-19 DIAGNOSIS — Z9221 Personal history of antineoplastic chemotherapy: Secondary | ICD-10-CM | POA: Diagnosis not present

## 2017-10-19 DIAGNOSIS — R232 Flushing: Secondary | ICD-10-CM | POA: Diagnosis not present

## 2017-10-19 DIAGNOSIS — G620 Drug-induced polyneuropathy: Secondary | ICD-10-CM | POA: Insufficient documentation

## 2017-10-19 DIAGNOSIS — M6530 Trigger finger, unspecified finger: Secondary | ICD-10-CM | POA: Diagnosis not present

## 2017-10-19 DIAGNOSIS — Z79811 Long term (current) use of aromatase inhibitors: Secondary | ICD-10-CM | POA: Diagnosis not present

## 2017-10-19 DIAGNOSIS — Z882 Allergy status to sulfonamides status: Secondary | ICD-10-CM

## 2017-10-19 MED ORDER — ANASTROZOLE 1 MG PO TABS: ORAL_TABLET | ORAL | 3 refills | Status: DC

## 2017-10-19 NOTE — Telephone Encounter (Signed)
Gave avs and calendar for February 2020 °

## 2017-10-19 NOTE — Progress Notes (Signed)
Patient Care Team: Midge Minium, MD as PCP - General (Family Medicine) Thea Silversmith, MD (Inactive) as Consulting Physician (Radiation Oncology) Nicholas Lose, MD as Consulting Physician (Hematology and Oncology)  DIAGNOSIS:  Encounter Diagnosis  Name Primary?  . Malignant neoplasm of central portion of left breast in female, estrogen receptor positive ([place])     SUMMARY OF ONCOLOGIC HISTORY:   Cancer of central portion of left female breast ([place])   04/15/2013 Initial Diagnosis    Left breast biopsy invasive ductal carcinoma with papillary features intermediate to high-grade, ER negative PR negative HER-2 negative Ki-67 47%      04/25/2013 Breast MRI    Left breast 12:00 position 2.4 cm area of abnormality, no lymph nodes      05/13/2013 - 09/02/2013 Neo-Adjuvant Chemotherapy    Adriamycin and Cytoxan 4 cycles followed by Taxol and carboplatin completed 9 out of 12 stopped due to Neuropathy      10/17/2013 Surgery    Left breast lumpectomy: 0.4 cm residual invasive ductal carcinoma 1 sentinel lymph node negative ER 52%, ER 0%, HER-2 negative ratio 1.18      11/14/2013 - 12/30/2013 Radiation Therapy    Adjuvant radiation therapy      01/20/2014 -  Anti-estrogen oral therapy    Arimidex 1 mg daily plan is for 5 years       CHIEF COMPLIANT: Follow-up on anastrozole  INTERVAL HISTORY: Ellen Beltran is a 72 year old with above-mentioned history left breast cancer treated with neoadjuvant chemotherapy followed by lumpectomy radiation is currently on anastrozole therapy.  She is tolerating anastrozole reasonably well.  She has had problems with her right hand with locked/trigger fingers.  She is seeing a hand specialist who have given her cortisone injections which have made slightly better.  She is also taking Mobic for pain control.  She thinks that the reason why her blood pressure is elevated she denies any lumps or nodules in the breast.  REVIEW OF SYSTEMS:     Constitutional: Denies fevers, chills or abnormal weight loss Eyes: Denies blurriness of vision Ears, nose, mouth, throat, and face: Denies mucositis or sore throat Respiratory: Denies cough, dyspnea or wheezes Cardiovascular: Denies palpitation, chest discomfort Gastrointestinal:  Denies nausea, heartburn or change in bowel habits Skin: Denies abnormal skin rashes Lymphatics: Denies new lymphadenopathy or easy bruising Neurological:Denies numbness, tingling or new weaknesses Behavioral/Psych: Mood is stable, no new changes  Extremities: No lower extremity edema Breast:  denies any pain or lumps or nodules in either breasts All other systems were reviewed with the patient and are negative.  I have reviewed the past medical history, past surgical history, social history and family history with the patient and they are unchanged from previous note.  ALLERGIES:  is allergic to codeine; morphine and related; and sulfa antibiotics.  MEDICATIONS:  Current Outpatient Medications  Medication Sig Dispense Refill  . anastrozole (ARIMIDEX) 1 MG tablet TAKE 1 TABLET (1 MG TOTAL) BY MOUTH DAILY. 90 tablet 0  . Calcium Citrate-Vitamin D (CALCIUM CITRATE + D3 PO) Take by mouth daily.    . Cholecalciferol (VITAMIN D PO) Take 5,000 mg by mouth daily.    . fluticasone (FLONASE) 50 MCG/ACT nasal spray Place 1 spray into both nostrils daily as needed. congestion 16 g 2  . meloxicam (MOBIC) 7.5 MG tablet Take 1 tablet (7.5 mg total) by mouth daily. 30 tablet 1  . Multiple Vitamins-Minerals (ALIVE WOMENS 50+ PO) Take 1 each by mouth daily.     Marland Kitchen  PROLIA 60 MG/ML SOLN injection Inject 60 mg into the skin every 6 (six) months. 1.8 mL 2  . simvastatin (ZOCOR) 40 MG tablet Take 1 tablet (40 mg total) by mouth every evening. 90 tablet 1   No current facility-administered medications for this visit.     PHYSICAL EXAMINATION: ECOG PERFORMANCE STATUS: 1 - Symptomatic but completely ambulatory  Vitals:    10/19/17 0844  BP: (!) 164/71  Pulse: 77  Resp: 19  Temp: 98 F (36.7 C)  SpO2: 100%   Filed Weights   10/19/17 0844  Weight: 102 lb 14.4 oz (46.7 kg)    GENERAL:alert, no distress and comfortable SKIN: skin color, texture, turgor are normal, no rashes or significant lesions EYES: normal, Conjunctiva are pink and non-injected, sclera clear OROPHARYNX:no exudate, no erythema and lips, buccal mucosa, and tongue normal  NECK: supple, thyroid normal size, non-tender, without nodularity LYMPH:  no palpable lymphadenopathy in the cervical, axillary or inguinal LUNGS: clear to auscultation and percussion with normal breathing effort HEART: regular rate & rhythm and no murmurs and no lower extremity edema ABDOMEN:abdomen soft, non-tender and normal bowel sounds MUSCULOSKELETAL:no cyanosis of digits and no clubbing  NEURO: alert & oriented x 3 with fluent speech, no focal motor/sensory deficits EXTREMITIES: Trigger finger  LABORATORY DATA:  I have reviewed the data as listed CMP Latest Ref Rng & Units 07/22/2017 01/21/2017 01/17/2016  Glucose 70 - 99 mg/dL 102(H) 97 86  BUN 6 - 23 mg/dL '16 14 14  ' Creatinine 0.40 - 1.20 mg/dL 0.75 0.74 0.70  Sodium 135 - 145 mEq/L 141 142 141  Potassium 3.5 - 5.1 mEq/L 4.0 4.1 4.3  Chloride 96 - 112 mEq/L 103 102 100  CO2 19 - 32 mEq/L 30 33(H) 28  Calcium 8.4 - 10.5 mg/dL 9.7 10.1 10.5(H)  Total Protein 6.0 - 8.3 g/dL 7.1 7.3 7.1  Total Bilirubin 0.2 - 1.2 mg/dL 0.5 0.6 0.5  Alkaline Phos 39 - 117 U/L 59 69 83  AST 0 - 37 U/L '16 19 20  ' ALT 0 - 35 U/L '17 18 18    ' Lab Results  Component Value Date   WBC 8.6 07/22/2017   HGB 13.3 07/22/2017   HCT 40.4 07/22/2017   MCV 92.4 07/22/2017   PLT 324.0 07/22/2017   NEUTROABS 4.8 07/22/2017    ASSESSMENT & PLAN:  Cancer of central portion of left female breast Left breast invasive ductal carcinoma clinical stage IIA initially triple negative with a Ki-67 of 47% underwent neoadjuvant chemotherapy  followed by left breast lumpectomy, residual 0.4 cm tumor but it was ER positive PR negative HER-2 negative, completed adjuvant radiation therapy and is currently on Arimidex 1 mg daily since May 2015  Arimidex-related toxicities: 1. Osteoporosis: T score minus 2.9 on 07/30/2016: Currently on Prolia injections along with calcium and vitamin D 2. Intermittent hot flashes  Monitoring closely for toxicities  Breast cancer surveillance: 1. Mammogram 08/03/17 was normal, breast density category B 2. Breast exam 10/19/2017 is normal 3. 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  No orders of the defined types were placed in this encounter.  The patient has a good understanding of the overall plan. she agrees with it. she will call with any problems that may develop before  the next visit here.   Harriette Ohara, MD 10/19/17

## 2017-10-19 NOTE — Assessment & Plan Note (Signed)
Left breast invasive ductal carcinoma clinical stage IIA initially triple negative with a Ki-67 of 47% underwent neoadjuvant chemotherapy followed by left breast lumpectomy, residual 0.4 cm tumor but it was ER positive PR negative HER-2 negative, completed adjuvant radiation therapy and is currently on Arimidex 1 mg daily since May 2015  Arimidex-related toxicities: 1. Osteoporosis: T score minus 2.9 on 07/30/2016: Currently on Prolia injections along with calcium and vitamin D 2. Intermittent hot flashes  Monitoring closely for toxicities  Breast cancer surveillance: 1. Mammogram 08/03/17 was normal, breast density category B 2. Breast exam 10/19/2017 is normal 3. 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

## 2017-10-21 ENCOUNTER — Ambulatory Visit: Payer: PPO | Admitting: Hematology and Oncology

## 2017-10-27 ENCOUNTER — Ambulatory Visit: Payer: Self-pay

## 2017-10-27 NOTE — Telephone Encounter (Signed)
Pt called office to make appt and call was triage to see if she was having any sx that would warrant a quicker appt than the one that was set up for tomorrow. Pt not having any symptoms related to HTN. Pt did say she had a H/A 2 days ago, but none today.  Reason for Disposition . [5] Systolic BP  >= 638 OR Diastolic >= 90 AND [7] not taking BP medications  Answer Assessment - Initial Assessment Questions 1. BLOOD PRESSURE: "What is the blood pressure?" "Did you take at least two measurements 5 minutes apart?"     Pt has been taking BP's daily since 10/15/17. Today's BP 150/71  BP's have averaged 140-164/61-97  2. ONSET: "When did you take your blood pressure?"     This am  3. HOW: "How did you obtain the blood pressure?" (e.g., visiting nurse, automatic home BP monitor)     Automatic BP machine 4. HISTORY: "Do you have a history of high blood pressure?"     yes 5. MEDICATIONS: "Are you taking any medications for blood pressure?" "Have you missed any doses recently?"     No was taken off BP meds when she was being tx for cancer, 6. OTHER SYMPTOMS: "Do you have any symptoms?" (e.g., headache, chest pain, blurred vision, difficulty breathing, weakness)     None today but did have headache 2 days ago. No spots or blurred vision, no numbness. Does have neuropathy from cancer tx, but no new sx.  7. PREGNANCY: "Is there any chance you are pregnant?" "When was your last menstrual period?"     n/a  Protocols used: HIGH BLOOD PRESSURE-A-AH

## 2017-10-28 ENCOUNTER — Encounter: Payer: Self-pay | Admitting: Family Medicine

## 2017-10-28 ENCOUNTER — Other Ambulatory Visit: Payer: Self-pay

## 2017-10-28 ENCOUNTER — Ambulatory Visit (INDEPENDENT_AMBULATORY_CARE_PROVIDER_SITE_OTHER): Payer: Medicare HMO | Admitting: Family Medicine

## 2017-10-28 VITALS — BP 152/92 | HR 68 | Resp 16 | Ht <= 58 in | Wt 100.4 lb

## 2017-10-28 DIAGNOSIS — I10 Essential (primary) hypertension: Secondary | ICD-10-CM | POA: Diagnosis not present

## 2017-10-28 MED ORDER — LISINOPRIL 10 MG PO TABS: 10.0000 mg | ORAL_TABLET | Freq: Every day | ORAL | 3 refills | Status: DC

## 2017-10-28 NOTE — Assessment & Plan Note (Signed)
Deteriorated.  Pt previously had HTN and was on Lisinopril but this was stopped when she had cancer treatment and BP dropped.  For the last month, BPs have been running high.  Asymptomatic w/ exception of mild HA last week.  Will restart Lisinopril 10mg  daily and monitor for improvement in BP and any changes to Cr.  Reviewed need for low Na diet, regular exercise, and increased water intake.  Reviewed supportive care and red flags that should prompt return.  Pt expressed understanding and is in agreement w/ plan.

## 2017-10-28 NOTE — Patient Instructions (Signed)
Follow up in 3-4 weeks to recheck BP and kidney function START the Lisinopril once daily Continue to limit salt intake- this will improve BP Drink plenty of water Call with any questions or concerns Happy Valentine's Day!!!

## 2017-10-28 NOTE — Progress Notes (Signed)
   Subjective:    Patient ID: Ellen Beltran, female    DOB: 1946-02-10, 72 y.o.   MRN: [id number]  HPI Elevated BP- pt's BP readings have been elevated since January.  Home BP readings are 140-160s/70s.  Pt reports the only difference is the Meloxicam.  She has had labile BPs since 2015.  No CP, SOB.  + mild HA last week.  No dizziness, N/V, edema.  Was previously on Lisinopril 10mg  daily but was able to stop this due to low BP readings during cancer txs.     Review of Systems For ROS see HPI     Objective:   Physical Exam  Constitutional: She is oriented to person, place, and time. She appears well-developed and well-nourished. No distress.  HENT:  Head: Normocephalic and atraumatic.  Eyes: Conjunctivae and EOM are normal. Pupils are equal, round, and reactive to light.  Neck: Normal range of motion. Neck supple. No thyromegaly present.  Cardiovascular: Normal rate, regular rhythm, normal heart sounds and intact distal pulses.  No murmur heard. Pulmonary/Chest: Effort normal and breath sounds normal. No respiratory distress.  Abdominal: Soft. She exhibits no distension. There is no tenderness.  Musculoskeletal: She exhibits no edema.  Lymphadenopathy:    She has no cervical adenopathy.  Neurological: She is alert and oriented to person, place, and time.  Skin: Skin is warm and dry.  Psychiatric: She has a normal mood and affect. Her behavior is normal.  Vitals reviewed.         Assessment & Plan:

## 2017-11-06 DIAGNOSIS — H524 Presbyopia: Secondary | ICD-10-CM | POA: Diagnosis not present

## 2017-11-06 DIAGNOSIS — H2513 Age-related nuclear cataract, bilateral: Secondary | ICD-10-CM | POA: Diagnosis not present

## 2017-11-11 DIAGNOSIS — R69 Illness, unspecified: Secondary | ICD-10-CM | POA: Diagnosis not present

## 2017-11-16 NOTE — Progress Notes (Signed)
patient supplied own Prolia on 06/01/17 but marked not patient supplied in error.

## 2017-11-18 ENCOUNTER — Encounter: Payer: Self-pay | Admitting: Family Medicine

## 2017-11-18 ENCOUNTER — Encounter: Payer: Self-pay | Admitting: General Practice

## 2017-11-18 ENCOUNTER — Ambulatory Visit (INDEPENDENT_AMBULATORY_CARE_PROVIDER_SITE_OTHER): Payer: Medicare HMO | Admitting: Family Medicine

## 2017-11-18 VITALS — BP 132/82 | HR 80 | Temp 98.1°F | Resp 16 | Ht <= 58 in | Wt 101.2 lb

## 2017-11-18 DIAGNOSIS — M1990 Unspecified osteoarthritis, unspecified site: Secondary | ICD-10-CM | POA: Insufficient documentation

## 2017-11-18 DIAGNOSIS — M159 Polyosteoarthritis, unspecified: Secondary | ICD-10-CM | POA: Diagnosis not present

## 2017-11-18 DIAGNOSIS — I10 Essential (primary) hypertension: Secondary | ICD-10-CM

## 2017-11-18 LAB — BASIC METABOLIC PANEL
BUN: 18 mg/dL (ref 6–23)
CO2: 30 meq/L (ref 19–32)
CREATININE: 0.85 mg/dL (ref 0.40–1.20)
Calcium: 10.1 mg/dL (ref 8.4–10.5)
Chloride: 100 mEq/L (ref 96–112)
GFR: 70.21 mL/min (ref >60.00)
Glucose, Bld: 93 mg/dL (ref 70–99)
Potassium: 4.4 mEq/L (ref 3.5–5.1)
SODIUM: 139 meq/L (ref 135–145)

## 2017-11-18 MED ORDER — MELOXICAM 7.5 MG PO TABS: 7.5000 mg | ORAL_TABLET | Freq: Every day | ORAL | 1 refills | Status: DC

## 2017-11-18 NOTE — Patient Instructions (Addendum)
Schedule a nurse visit for the Prolia injection Follow up as scheduled in May for your physical We'll notify you of your lab results and make any changes if needed Keep up the good work- you look great!!! Call with any questions or concerns Happy Spring!!!

## 2017-11-18 NOTE — Assessment & Plan Note (Signed)
Chronic problem.  Much better controlled since restarting Lisinopril.  Check BMP.  No anticipated med changes.  Will follow.

## 2017-11-18 NOTE — Progress Notes (Signed)
   Subjective:    Patient ID: Ellen Beltran, female    DOB: 1946-04-10, 72 y.o.   MRN: [id number]  HPI HTN- chronic problem.  Lisinopril was restarted at last visit.  BP is much better- w/ home readings ranging 108-146/62-85.  HAs have resolved.  No CP, SOB, visual changes, edema.  Osteoarthritis- ongoing issue, pt is due for refill of Meloxicam  Review of Systems For ROS see HPI     Objective:   Physical Exam  Constitutional: She is oriented to person, place, and time. She appears well-developed and well-nourished. No distress.  HENT:  Head: Normocephalic and atraumatic.  Eyes: Conjunctivae and EOM are normal. Pupils are equal, round, and reactive to light.  Neck: Normal range of motion. Neck supple. No thyromegaly present.  Cardiovascular: Normal rate, regular rhythm, normal heart sounds and intact distal pulses.  No murmur heard. Pulmonary/Chest: Effort normal and breath sounds normal. No respiratory distress.  Abdominal: Soft. She exhibits no distension. There is no tenderness.  Musculoskeletal: She exhibits no edema.  Lymphadenopathy:    She has no cervical adenopathy.  Neurological: She is alert and oriented to person, place, and time.  Skin: Skin is warm and dry.  Psychiatric: She has a normal mood and affect. Her behavior is normal.  Vitals reviewed.         Assessment & Plan:

## 2017-11-18 NOTE — Assessment & Plan Note (Signed)
Refill provided on Meloxicam

## 2017-11-25 ENCOUNTER — Other Ambulatory Visit: Payer: Self-pay | Admitting: General Practice

## 2017-11-25 MED ORDER — PROLIA 60 MG/ML ~~LOC~~ SOLN: 60.0000 mg | SUBCUTANEOUS | 1 refills | Status: DC

## 2017-12-01 ENCOUNTER — Telehealth: Payer: Self-pay | Admitting: General Practice

## 2017-12-01 NOTE — Telephone Encounter (Signed)
Called and informed pt. Appt scheduled for 12/07/17. Please call pt and schedule sooner appointment if Prolia comes in sooner.    This does not get an administration charge from the office. Please check pt supplied box.   Copied from [place]. Topic: General - Other >> Dec 01, 2017  8:51 AM Cecelia Byars, NT wrote: Reason for CRM: [facility] will  be delivering prolia for the patient to the patient tomorrow

## 2017-12-07 ENCOUNTER — Ambulatory Visit (INDEPENDENT_AMBULATORY_CARE_PROVIDER_SITE_OTHER): Payer: Medicare HMO | Admitting: *Deleted

## 2017-12-07 DIAGNOSIS — M810 Age-related osteoporosis without current pathological fracture: Secondary | ICD-10-CM

## 2017-12-07 MED ORDER — DENOSUMAB 60 MG/ML ~~LOC~~ SOLN
60.0000 mg | Freq: Once | SUBCUTANEOUS | Status: AC
  Administered 2017-12-07: 60 mg via SUBCUTANEOUS

## 2017-12-07 NOTE — Progress Notes (Addendum)
Patient in for prolia injection.   Patient tolerated well.   Above order was mine.  Annye Asa, MD

## 2017-12-10 ENCOUNTER — Encounter: Payer: Self-pay | Admitting: Family Medicine

## 2017-12-10 ENCOUNTER — Ambulatory Visit (INDEPENDENT_AMBULATORY_CARE_PROVIDER_SITE_OTHER): Payer: Medicare HMO | Admitting: Family Medicine

## 2017-12-10 ENCOUNTER — Other Ambulatory Visit: Payer: Self-pay

## 2017-12-10 DIAGNOSIS — J309 Allergic rhinitis, unspecified: Secondary | ICD-10-CM | POA: Insufficient documentation

## 2017-12-10 DIAGNOSIS — J301 Allergic rhinitis due to pollen: Secondary | ICD-10-CM | POA: Diagnosis not present

## 2017-12-10 MED ORDER — MONTELUKAST SODIUM 10 MG PO TABS: 10.0000 mg | ORAL_TABLET | Freq: Every day | ORAL | 3 refills | Status: DC

## 2017-12-10 NOTE — Patient Instructions (Signed)
Follow up as needed or as scheduled Continue the Zyrtec daily Use the Flonase daily ADD the Singulair nightly Mucinex DM for cough and congestion Drink plenty of fluids REST!!! Call with any questions or concerns Hang in there!!!

## 2017-12-10 NOTE — Assessment & Plan Note (Signed)
Deteriorated.  Pt's sxs are bothersome despite daily Zyrtec and PRN Flonase.  Stressed need for daily use of nasal steroid.  Add Singulair.  Offered Prednisone for inflammation- pt declined.  Reviewed supportive care and red flags that should prompt return.  Pt expressed understanding and is in agreement w/ plan.

## 2017-12-10 NOTE — Progress Notes (Signed)
   Subjective:    Patient ID: Ellen Beltran, female    DOB: 10-31-1945, 72 y.o.   MRN: [id number]  HPI URI- sxs started ~3 weeks ago.  She started Zyrtec around the same time.  'it's real bad at night'- PND, watery eyes.  + cough.  No fevers.  + frontal HA.  No sinus pain/pressure.  No ear pain.  Using Flonase nightly.   Review of Systems For ROS see HPI     Objective:   Physical Exam  Constitutional: She appears well-developed and well-nourished. No distress.  HENT:  Head: Normocephalic and atraumatic.  Right Ear: Tympanic membrane normal.  Left Ear: Tympanic membrane normal.  Nose: Mucosal edema and rhinorrhea present. Right sinus exhibits no maxillary sinus tenderness and no frontal sinus tenderness. Left sinus exhibits no maxillary sinus tenderness and no frontal sinus tenderness.  Mouth/Throat: Mucous membranes are normal. Posterior oropharyngeal erythema (w/ PND) present.  Eyes: Pupils are equal, round, and reactive to light. Conjunctivae and EOM are normal.  Neck: Normal range of motion. Neck supple.  Cardiovascular: Normal rate, regular rhythm and normal heart sounds.  Pulmonary/Chest: Effort normal and breath sounds normal. No respiratory distress. She has no wheezes. She has no rales.  Lymphadenopathy:    She has no cervical adenopathy.  Vitals reviewed.         Assessment & Plan:

## 2017-12-23 ENCOUNTER — Telehealth: Payer: Self-pay | Admitting: Family Medicine

## 2017-12-23 MED ORDER — AZITHROMYCIN 250 MG PO TABS: ORAL_TABLET | ORAL | 0 refills | Status: DC

## 2017-12-23 NOTE — Addendum Note (Signed)
Addended by: Davis Gourd on: 12/23/2017 10:41 AM   Modules accepted: Orders

## 2017-12-23 NOTE — Telephone Encounter (Signed)
Med filled to local pharmacy, pt made aware.

## 2017-12-23 NOTE — Telephone Encounter (Signed)
Copied from [place] [id number]. Topic: Quick Communication - See Telephone Encounter >> Dec 23, 2017  8:43 AM Arletha Grippe wrote: CRM for notification. See Telephone encounter for: 12/23/17. the current rx pt is taking is not helping, pt is requesting z pack. [facility] [place] [facility] [phone]

## 2017-12-23 NOTE — Telephone Encounter (Signed)
Patient reports she is not any better from her visit on 12/10/17. Patient reports the cough and drainage is keeping her up at night. Her eyes are draining as well. She has a dental appointment coming up and she thinks she may have to cancel it - because she can't keep from coughing. Patient has not been this bad in several years. Patient is requesting an antibiotic for treatment- she does not want to come back in- she has been in several times.

## 2017-12-23 NOTE — Telephone Encounter (Signed)
[place] for [employer] but if no improvement will need OV

## 2018-01-13 ENCOUNTER — Other Ambulatory Visit: Payer: Self-pay | Admitting: Family Medicine

## 2018-01-19 ENCOUNTER — Other Ambulatory Visit: Payer: Self-pay | Admitting: Family Medicine

## 2018-01-26 NOTE — Progress Notes (Addendum)
Subjective:   Ellen Beltran is a 72 y.o. female who presents for Medicare Annual (Subsequent) preventive examination.  Review of Systems:  No ROS.  Medicare Wellness Visit. Additional risk factors are reflected in the social history.  Cardiac Risk Factors include: advanced age (>75men, >21 women);family history of premature cardiovascular disease;dyslipidemia;hypertension   Sleep patterns: Sleeps poorly d/t coughing.  Home Safety/Smoke Alarms: Feels safe in home. Smoke alarms in place.  Living environment; residence and Firearm Safety: Lives with husband in 1 story home, steps at door with rail.  Seat Belt Safety/Bike Helmet: Wears seat belt.   Female:   Pap-N/A      Mammo-08/03/2017, BI-RADS CATEGORY  2: Benign      Dexa scan-07/30/2016, Osteoporosis. Prolia injections        CCS-Colonoscopy 06/22/2012, normal. Recall 10 years       Objective:     Vitals: BP (!) 148/70 (BP Location: Left Arm, Patient Position: Sitting, Cuff Size: Normal)   Pulse 80   Temp 98.1 F (36.7 C) (Temporal)   Resp 16   Ht 4\' 8"  (1.422 m)   Wt 102 lb 2 oz (46.3 kg)   SpO2 98%   BMI 22.90 kg/m   Body mass index is 22.9 kg/m.  Advanced Directives 01/27/2018 01/21/2017 10/22/2016 05/21/2016 03/23/2015 09/22/2014 07/26/2014  Does Patient Have a Medical Advance Directive? No No No No No No No  Would patient like information on creating a medical advance directive? No - Patient declined No - Patient declined - - - - No - patient declined information  Pre-existing out of facility DNR order (yellow form or pink MOST form) - - - - - - -    Tobacco Social History   Tobacco Use  Smoking Status Never Smoker  Smokeless Tobacco Never Used     Counseling given: Not Answered   Past Medical History:  Diagnosis Date  . Arthritis   . Cancer [facility])    left breast  . Dental bridge present    left lower  . H/O mumps   . History of blood transfusion 09/24/2013  . History of chemotherapy    stopped 08/2013    . History of chicken pox   . Hyperlipidemia   . Neuropathy due to chemotherapeutic drug ([place])    hands and feet  . Osteopenia   . Personal history of chemotherapy    2015  . Personal history of radiation therapy    2015   Past Surgical History:  Procedure Laterality Date  . BREAST LUMPECTOMY Left 10/17/2013  . BREAST LUMPECTOMY WITH NEEDLE LOCALIZATION AND AXILLARY SENTINEL LYMPH NODE BX Left 10/17/2013   Procedure: BREAST LUMPECTOMY WITH NEEDLE LOCALIZATION AND AXILLARY SENTINEL LYMPH NODE BX;  Surgeon: Shann Medal, MD;  Location: [place];  Service: General;  Laterality: Left;  . CARDIAC CATHETERIZATION  08/31/2003   no disease  . COLONOSCOPY WITH PROPOFOL  06/22/2012  . PELVIC FLOOR REPAIR  05/06/2001   cardinal uterosacral colposuspension  . PORTACATH PLACEMENT N/A 05/02/2013   Procedure: INSERTION PORT-A-CATH;  Surgeon: Shann Medal, MD;  Location: [facility];  Service: General;  Laterality: N/A;  . TOTAL ABDOMINAL HYSTERECTOMY W/ BILATERAL SALPINGOOPHORECTOMY  05/06/2001  . TUBAL LIGATION  age 89   Family History  Problem Relation Age of Onset  . Heart disease Father   . Brain cancer Father   . Heart disease Brother   . Diabetes Brother   . Lung cancer Mother   . Thyroid disease  Sister   . Thyroid disease Sister   . Thyroid disease Sister   . Cancer Maternal Uncle        pancreatic  . Cancer Maternal Uncle        colon   Social History   Socioeconomic History  . Marital status: Married    Spouse name: Not on file  . Number of children: Not on file  . Years of education: Not on file  . Highest education level: Not on file  Occupational History  . Not on file  Social Needs  . Financial resource strain: Not on file  . Food insecurity:    Worry: Not on file    Inability: Not on file  . Transportation needs:    Medical: Not on file    Non-medical: Not on file  Tobacco Use  . Smoking status: Never Smoker  . Smokeless tobacco: Never Used   Substance and Sexual Activity  . Alcohol use: Yes  . Drug use: No  . Sexual activity: Yes  Lifestyle  . Physical activity:    Days per week: Not on file    Minutes per session: Not on file  . Stress: Not on file  Relationships  . Social connections:    Talks on phone: Not on file    Gets together: Not on file    Attends religious service: Not on file    Active member of club or organization: Not on file    Attends meetings of clubs or organizations: Not on file    Relationship status: Not on file  Other Topics Concern  . Not on file  Social History Narrative  . Not on file    Outpatient Encounter Medications as of 01/27/2018  Medication Sig  . anastrozole (ARIMIDEX) 1 MG tablet TAKE 1 TABLET (1 MG TOTAL) BY MOUTH DAILY.  . Calcium Citrate-Vitamin D (CALCIUM CITRATE + D3 PO) Take by mouth daily.  . Cholecalciferol (VITAMIN D PO) Take 5,000 mg by mouth daily.  . fluticasone (FLONASE) 50 MCG/ACT nasal spray Place 1 spray into both nostrils daily as needed. congestion  . lisinopril (PRINIVIL,ZESTRIL) 10 MG tablet TAKE 1 TABLET BY MOUTH EVERY DAY  . meloxicam (MOBIC) 7.5 MG tablet TAKE 1 TABLET BY MOUTH EVERY DAY  . montelukast (SINGULAIR) 10 MG tablet Take 1 tablet (10 mg total) by mouth at bedtime.  . Multiple Vitamins-Minerals (ALIVE WOMENS 50+ PO) Take 1 each by mouth daily.   . Nutritional Supplements (ANTI-INFLAMMATORY ENZYME) CAPS APPLY 1 2 GMS TO AFFECTED AREA 3 4 TIMES DAILY AS NEEDED FOR PAIN  THIS IS A COMPOUNDED CREAM 1 PUMP EQUALS 1 GRAM  . PROLIA 60 MG/ML SOLN injection Inject 60 mg into the skin every 6 (six) months.  . simvastatin (ZOCOR) 40 MG tablet Take 1 tablet (40 mg total) by mouth every evening.  . [DISCONTINUED] azithromycin (ZITHROMAX) 250 MG tablet Take 2 tablets on day 1, then 1 tablet on days 2-5.   No facility-administered encounter medications on file as of 01/27/2018.     Activities of Daily Living In your present state of health, do you have any  difficulty performing the following activities: 01/27/2018 09/30/2017  Hearing? N N  Vision? N N  Difficulty concentrating or making decisions? N N  Walking or climbing stairs? N N  Dressing or bathing? N N  Doing errands, shopping? N N  Preparing Food and eating ? N -  Using the Toilet? N -  In the past six months, have  you accidently leaked urine? N -  Do you have problems with loss of bowel control? N -  Managing your Medications? N -  Managing your Finances? N -  Housekeeping or managing your Housekeeping? N -  Some recent data might be hidden    Patient Care Team: Midge Minium, MD as PCP - General (Family Medicine) Thea Silversmith, MD as Consulting Physician (Radiation Oncology) Nicholas Lose, MD as Consulting Physician (Hematology and Oncology) Alphonsa Overall, MD as Consulting Physician (General Surgery)    Assessment:   This is a routine wellness examination for Ellen Beltran.  Exercise Activities and Dietary recommendations Current Exercise Habits: The patient does not participate in regular exercise at present(Maintains household ), Type of exercise: walking(walks dog), Exercise limited by: None identified   Diet (meal preparation, eat out, water intake, caffeinated beverages, dairy products, fruits and vegetables): Drinks green tea, diet soda and water.   Breakfast: breakfast wafers; oatmeal; coffee Lunch: crackers, fruit Dinner: protein and vegetables.   Goals    . Patient Stated     Would like to stay active.        Fall Risk Fall Risk  01/27/2018 09/30/2017 07/22/2017 01/21/2017 01/17/2016  Falls in the past year? No No No No No  Number falls in past yr: - - - - -  Injury with Fall? - - - - -   I Depression Screen PHQ 2/9 Scores 01/27/2018 09/30/2017 07/22/2017 07/22/2017  PHQ - 2 Score 0 0 0 0  PHQ- 9 Score - 0 - 0     Cognitive Function MMSE - Mini Mental State Exam 01/27/2018  Orientation to time 5  Orientation to Place 5  Registration 3  Attention/  Calculation 5  Recall 3  Language- name 2 objects 2  Language- repeat 1  Language- follow 3 step command 3  Language- read & follow direction 1  Write a sentence 1  Copy design 1  Total score 30        Immunization History  Administered Date(s) Administered  . Influenza Split 06/09/2014  . Influenza,inj,Quad PF,6+ Mos 07/29/2013  . Influenza-Unspecified 06/04/2015, 05/23/2016, 06/16/2017  . Pneumococcal Conjugate-13 02/27/2014  . Pneumococcal Polysaccharide-23 02/06/2015  . Tdap 01/02/2016  . Zoster 03/17/2014  . Zoster Recombinat (Shingrix) 05/15/2017, 08/11/2017    Screening Tests Health Maintenance  Topic Date Due  . INFLUENZA VACCINE  04/15/2018  . MAMMOGRAM  08/03/2018  . COLONOSCOPY  06/22/2022  . TETANUS/TDAP  01/01/2026  . DEXA SCAN  Completed  . Hepatitis C Screening  Completed  . PNA vac Low Risk Adult  Completed        Plan:    Continue doing brain stimulating activities (puzzles, reading, adult coloring books, staying active) to keep memory sharp.   Bring a copy of your living will and/or healthcare power of attorney to your next office visit.  I have personally reviewed and noted the following in the patient's chart:   . Medical and social history . Use of alcohol, tobacco or illicit drugs  . Current medications and supplements . Functional ability and status . Nutritional status . Physical activity . Advanced directives . List of other physicians . Hospitalizations, surgeries, and ER visits in previous 12 months . Vitals . Screenings to include cognitive, depression, and falls . Referrals and appointments  In addition, I have reviewed and discussed with patient certain preventive protocols, quality metrics, and best practice recommendations. A written personalized care plan for preventive services as well as general preventive health recommendations  were provided to patient.     Gerilyn Nestle, RN  01/27/2018  Reviewed documentation  provided by RN and agree w/ above.  Annye Asa, MD

## 2018-01-27 ENCOUNTER — Other Ambulatory Visit: Payer: Self-pay | Admitting: Family Medicine

## 2018-01-27 ENCOUNTER — Ambulatory Visit (INDEPENDENT_AMBULATORY_CARE_PROVIDER_SITE_OTHER): Payer: Medicare HMO

## 2018-01-27 ENCOUNTER — Other Ambulatory Visit: Payer: Self-pay | Admitting: General Practice

## 2018-01-27 ENCOUNTER — Ambulatory Visit (INDEPENDENT_AMBULATORY_CARE_PROVIDER_SITE_OTHER): Payer: Medicare HMO | Admitting: Family Medicine

## 2018-01-27 ENCOUNTER — Encounter: Payer: Self-pay | Admitting: Family Medicine

## 2018-01-27 ENCOUNTER — Other Ambulatory Visit: Payer: Self-pay

## 2018-01-27 VITALS — BP 148/70 | HR 80 | Temp 98.1°F | Resp 16 | Ht <= 58 in | Wt 102.1 lb

## 2018-01-27 VITALS — BP 130/83 | HR 80 | Temp 98.1°F | Resp 16 | Ht <= 58 in | Wt 102.1 lb

## 2018-01-27 DIAGNOSIS — E785 Hyperlipidemia, unspecified: Secondary | ICD-10-CM

## 2018-01-27 DIAGNOSIS — Z0000 Encounter for general adult medical examination without abnormal findings: Secondary | ICD-10-CM

## 2018-01-27 DIAGNOSIS — R05 Cough: Secondary | ICD-10-CM

## 2018-01-27 DIAGNOSIS — J449 Chronic obstructive pulmonary disease, unspecified: Secondary | ICD-10-CM

## 2018-01-27 DIAGNOSIS — R059 Cough, unspecified: Secondary | ICD-10-CM

## 2018-01-27 DIAGNOSIS — M810 Age-related osteoporosis without current pathological fracture: Secondary | ICD-10-CM

## 2018-01-27 DIAGNOSIS — Z0001 Encounter for general adult medical examination with abnormal findings: Secondary | ICD-10-CM | POA: Diagnosis not present

## 2018-01-27 DIAGNOSIS — R9389 Abnormal findings on diagnostic imaging of other specified body structures: Secondary | ICD-10-CM

## 2018-01-27 LAB — LIPID PANEL
CHOL/HDL RATIO: 3
CHOLESTEROL: 210 mg/dL — AB (ref 0–200)
HDL: 72.3 mg/dL (ref >39.00)
LDL CALC: 114 mg/dL — AB (ref 0–99)
NonHDL: 137.79
TRIGLYCERIDES: 120 mg/dL (ref 0.0–149.0)
VLDL: 24 mg/dL (ref 0.0–40.0)

## 2018-01-27 LAB — CBC WITH DIFFERENTIAL/PLATELET
BASOS ABS: 0.1 10*3/uL (ref 0.0–0.1)
Basophils Relative: 1.1 % (ref 0.0–3.0)
Eosinophils Absolute: 0 10*3/uL (ref 0.0–0.7)
Eosinophils Relative: 0.5 % (ref 0.0–5.0)
HEMATOCRIT: 38.2 % (ref 36.0–46.0)
Hemoglobin: 12.6 g/dL (ref 12.0–15.0)
LYMPHS ABS: 2 10*3/uL (ref 0.7–4.0)
LYMPHS PCT: 30.4 % (ref 12.0–46.0)
MCHC: 33.1 g/dL (ref 30.0–36.0)
MCV: 91.3 fl (ref 78.0–100.0)
MONOS PCT: 21.2 % — AB (ref 3.0–12.0)
Monocytes Absolute: 1.4 10*3/uL — ABNORMAL HIGH (ref 0.1–1.0)
NEUTROS PCT: 46.8 % (ref 43.0–77.0)
Neutro Abs: 3.1 10*3/uL (ref 1.4–7.7)
Platelets: 304 10*3/uL (ref 150.0–400.0)
RBC: 4.19 Mil/uL (ref 3.87–5.11)
RDW: 14.1 % (ref 11.5–15.5)
WBC: 6.7 10*3/uL (ref 4.0–10.5)

## 2018-01-27 LAB — HEPATIC FUNCTION PANEL
ALBUMIN: 4.3 g/dL (ref 3.5–5.2)
ALT: 15 U/L (ref 0–35)
AST: 18 U/L (ref 0–37)
Alkaline Phosphatase: 62 U/L (ref 39–117)
Bilirubin, Direct: 0.1 mg/dL (ref 0.0–0.3)
TOTAL PROTEIN: 7 g/dL (ref 6.0–8.3)
Total Bilirubin: 0.3 mg/dL (ref 0.2–1.2)

## 2018-01-27 LAB — BASIC METABOLIC PANEL
BUN: 13 mg/dL (ref 6–23)
CALCIUM: 9.4 mg/dL (ref 8.4–10.5)
CO2: 28 mEq/L (ref 19–32)
Chloride: 98 mEq/L (ref 96–112)
Creatinine, Ser: 0.86 mg/dL (ref 0.40–1.20)
GFR: 69.23 mL/min (ref >60.00)
GLUCOSE: 105 mg/dL — AB (ref 70–99)
Potassium: 3.9 mEq/L (ref 3.5–5.1)
Sodium: 136 mEq/L (ref 135–145)

## 2018-01-27 LAB — VITAMIN D 25 HYDROXY (VIT D DEFICIENCY, FRACTURES): VITD: 69.08 ng/mL (ref 30.00–100.00)

## 2018-01-27 LAB — TSH: TSH: 2.98 u[IU]/mL (ref 0.35–4.50)

## 2018-01-27 MED ORDER — PREDNISONE 10 MG PO TABS: ORAL_TABLET | ORAL | 0 refills | Status: DC

## 2018-01-27 MED ORDER — ALBUTEROL SULFATE HFA 108 (90 BASE) MCG/ACT IN AERS: 2.0000 | INHALATION_SPRAY | Freq: Four times a day (QID) | RESPIRATORY_TRACT | 1 refills | Status: DC | PRN

## 2018-01-27 NOTE — Progress Notes (Signed)
Prednisone taper sent.

## 2018-01-27 NOTE — Progress Notes (Signed)
   Subjective:    Patient ID: Ellen Beltran, female    DOB: 08/25/1946, 72 y.o.   MRN: [id number]  HPI CPE- UTD on colonoscopy, mammo, DEXA.  URI- pt continues to take Flonase, Singulair, Zyrtec and completed a Zpack.  Cough improved but didn't resolve and now it has worsened again.  Keeping her awake at night.  Cough is productive of thick white sputum.  No fevers.  Review of Systems Patient reports no vision/ hearing changes, adenopathy,fever, weight change,  persistant/recurrent hoarseness , swallowing issues, chest pain, palpitations, edema, hemoptysis, dyspnea (rest/exertional/paroxysmal nocturnal), gastrointestinal bleeding (melena, rectal bleeding), abdominal pain, significant heartburn, bowel changes, GU symptoms (dysuria, hematuria, incontinence), Gyn symptoms (abnormal  bleeding, pain),  syncope, focal weakness, memory loss, numbness & tingling, skin/hair/nail changes, abnormal bruising or bleeding, anxiety, or depression.     Objective:   Physical Exam General Appearance:    Alert, cooperative, no distress, appears stated age  Head:    Normocephalic, without obvious abnormality, atraumatic  Eyes:    PERRL, conjunctiva/corneas clear, EOM's intact, fundi    benign, both eyes  Ears:    Normal TM's and external ear canals, both ears  Nose:   Nares normal, septum midline, mucosa normal, no drainage    or sinus tenderness  Throat:   Lips, mucosa, and tongue normal; teeth and gums normal  Neck:   Supple, symmetrical, trachea midline, no adenopathy;    Thyroid: no enlargement/tenderness/nodules  Back:     Symmetric, no curvature, ROM normal, no CVA tenderness  Lungs:     Clear to auscultation bilaterally, respirations unlabored, + hacking cough  Chest Wall:    No tenderness or deformity   Heart:    Regular rate and rhythm, S1 and S2 normal, no murmur, rub   or gallop  Breast Exam:    Deferred to mammo  Abdomen:     Soft, non-tender, bowel sounds active all four quadrants,    no  masses, no organomegaly  Genitalia:    Deferred  Rectal:    Extremities:   Extremities normal, atraumatic, no cyanosis or edema  Pulses:   2+ and symmetric all extremities  Skin:   Skin color, texture, turgor normal, no rashes or lesions  Lymph nodes:   Cervical, supraclavicular, and axillary nodes normal  Neurologic:   CNII-XII intact, normal strength, sensation and reflexes    throughout          Assessment & Plan:  Cough- pt has had ongoing cough for most of the spring.  Only temporary improvement w/ Zpack and no better since adding Singulair.  Check CXR.  If normal, will start Pred taper for airway inflammation.  Add Albuterol prn.  Reviewed supportive care and red flags that should prompt return.  Pt expressed understanding and is in agreement w/ plan.

## 2018-01-27 NOTE — Patient Instructions (Signed)
Follow up in 6 months to recheck cholesterol and blood pressure We'll notify you of your lab results and make any changes if needed Continue to work on healthy diet and regular exercise- you look great! Go to the [place] office and get your chest xray START the Albuterol inhaler- 2 puffs every 4 hrs as needed for cough Continue the Mucinex DM for cough and congestion Drink plenty of fluids REST! Call with any questions or concerns Hang in there!

## 2018-01-27 NOTE — Assessment & Plan Note (Signed)
Pt's PE WNL w/ exception of cough.  UTD on mammo, colonoscopy, immunizations, DEXA. Check labs.  Anticipatory guidance provided.

## 2018-01-27 NOTE — Assessment & Plan Note (Signed)
UTD on DEXA.  On Prolia.  Check Vit D level and replete prn.

## 2018-01-27 NOTE — Assessment & Plan Note (Signed)
Chronic problem.  Tolerating statin w/o difficulty.  Check labs.  Adjust meds prn  

## 2018-01-27 NOTE — Patient Instructions (Addendum)
Continue doing brain stimulating activities (puzzles, reading, adult coloring books, staying active) to keep memory sharp.   Bring a copy of your living will and/or healthcare power of attorney to your next office visit.   Health Maintenance, Female Adopting a healthy lifestyle and getting preventive care can go a long way to promote health and wellness. Talk with your health care provider about what schedule of regular examinations is right for you. This is a good chance for you to check in with your provider about disease prevention and staying healthy. In between checkups, there are plenty of things you can do on your own. Experts have done a lot of research about which lifestyle changes and preventive measures are most likely to keep you healthy. Ask your health care provider for more information. Weight and diet Eat a healthy diet  Be sure to include plenty of vegetables, fruits, low-fat dairy products, and lean protein.  Do not eat a lot of foods high in solid fats, added sugars, or salt.  Get regular exercise. This is one of the most important things you can do for your health. ? Most adults should exercise for at least 150 minutes each week. The exercise should increase your heart rate and make you sweat (moderate-intensity exercise). ? Most adults should also do strengthening exercises at least twice a week. This is in addition to the moderate-intensity exercise.  Maintain a healthy weight  Body mass index (BMI) is a measurement that can be used to identify possible weight problems. It estimates body fat based on height and weight. Your health care provider can help determine your BMI and help you achieve or maintain a healthy weight.  For females 20 years of age and older: ? A BMI below 18.5 is considered underweight. ? A BMI of 18.5 to 24.9 is normal. ? A BMI of 25 to 29.9 is considered overweight. ? A BMI of 30 and above is considered obese.  Watch levels of cholesterol and  blood lipids  You should start having your blood tested for lipids and cholesterol at 71 years of age, then have this test every 5 years.  You may need to have your cholesterol levels checked more often if: ? Your lipid or cholesterol levels are high. ? You are older than 71 years of age. ? You are at high risk for heart disease.  Cancer screening Lung Cancer  Lung cancer screening is recommended for adults 55-80 years old who are at high risk for lung cancer because of a history of smoking.  A yearly low-dose CT scan of the lungs is recommended for people who: ? Currently smoke. ? Have quit within the past 15 years. ? Have at least a 30-pack-year history of smoking. A pack year is smoking an average of one pack of cigarettes a day for 1 year.  Yearly screening should continue until it has been 15 years since you quit.  Yearly screening should stop if you develop a health problem that would prevent you from having lung cancer treatment.  Breast Cancer  Practice breast self-awareness. This means understanding how your breasts normally appear and feel.  It also means doing regular breast self-exams. Let your health care provider know about any changes, no matter how small.  If you are in your 20s or 30s, you should have a clinical breast exam (CBE) by a health care provider every 1-3 years as part of a regular health exam.  If you are 40 or older, have a CBE   year. Also consider having a breast X-ray (mammogram) every year.  If you have a family history of breast cancer, talk to your health care provider about genetic screening.  If you are at high risk for breast cancer, talk to your health care provider about having an MRI and a mammogram every year.  Breast cancer gene (BRCA) assessment is recommended for women who have family members with BRCA-related cancers. BRCA-related cancers include: ? Breast. ? Ovarian. ? Tubal. ? Peritoneal cancers.  Results of the assessment  will determine the need for genetic counseling and BRCA1 and BRCA2 testing.  Cervical Cancer Your health care provider may recommend that you be screened regularly for cancer of the pelvic organs (ovaries, uterus, and vagina). This screening involves a pelvic examination, including checking for microscopic changes to the surface of your cervix (Pap test). You may be encouraged to have this screening done every 3 years, beginning at age 21.  For women ages 30-65, health care providers may recommend pelvic exams and Pap testing every 3 years, or they may recommend the Pap and pelvic exam, combined with testing for human papilloma virus (HPV), every 5 years. Some types of HPV increase your risk of cervical cancer. Testing for HPV may also be done on women of any age with unclear Pap test results.  Other health care providers may not recommend any screening for nonpregnant women who are considered low risk for pelvic cancer and who do not have symptoms. Ask your health care provider if a screening pelvic exam is right for you.  If you have had past treatment for cervical cancer or a condition that could lead to cancer, you need Pap tests and screening for cancer for at least 20 years after your treatment. If Pap tests have been discontinued, your risk factors (such as having a new sexual partner) need to be reassessed to determine if screening should resume. Some women have medical problems that increase the chance of getting cervical cancer. In these cases, your health care provider may recommend more frequent screening and Pap tests.  Colorectal Cancer  This type of cancer can be detected and often prevented.  Routine colorectal cancer screening usually begins at 71 years of age and continues through 71 years of age.  Your health care provider may recommend screening at an earlier age if you have risk factors for colon cancer.  Your health care provider may also recommend using home test kits to  check for hidden blood in the stool.  A small camera at the end of a tube can be used to examine your colon directly (sigmoidoscopy or colonoscopy). This is done to check for the earliest forms of colorectal cancer.  Routine screening usually begins at age 50.  Direct examination of the colon should be repeated every 5-10 years through 71 years of age. However, you may need to be screened more often if early forms of precancerous polyps or small growths are found.  Skin Cancer  Check your skin from head to toe regularly.  Tell your health care provider about any new moles or changes in moles, especially if there is a change in a mole's shape or color.  Also tell your health care provider if you have a mole that is larger than the size of a pencil eraser.  Always use sunscreen. Apply sunscreen liberally and repeatedly throughout the day.  Protect yourself by wearing long sleeves, pants, a wide-brimmed hat, and sunglasses whenever you are outside.  Heart disease, diabetes, and   high blood pressure  High blood pressure causes heart disease and increases the risk of stroke. High blood pressure is more likely to develop in: ? People who have blood pressure in the high end of the normal range (130-139/85-89 mm Hg). ? People who are overweight or obese. ? People who are African American.  If you are 18-39 years of age, have your blood pressure checked every 3-5 years. If you are 40 years of age or older, have your blood pressure checked every year. You should have your blood pressure measured twice-once when you are at a hospital or clinic, and once when you are not at a hospital or clinic. Record the average of the two measurements. To check your blood pressure when you are not at a hospital or clinic, you can use: ? An automated blood pressure machine at a pharmacy. ? A home blood pressure monitor.  If you are between 55 years and 79 years old, ask your health care provider if you should  take aspirin to prevent strokes.  Have regular diabetes screenings. This involves taking a blood sample to check your fasting blood sugar level. ? If you are at a normal weight and have a low risk for diabetes, have this test once every three years after 71 years of age. ? If you are overweight and have a high risk for diabetes, consider being tested at a younger age or more often. Preventing infection Hepatitis B  If you have a higher risk for hepatitis B, you should be screened for this virus. You are considered at high risk for hepatitis B if: ? You were born in a country where hepatitis B is common. Ask your health care provider which countries are considered high risk. ? Your parents were born in a high-risk country, and you have not been immunized against hepatitis B (hepatitis B vaccine). ? You have HIV or AIDS. ? You use needles to inject street drugs. ? You live with someone who has hepatitis B. ? You have had sex with someone who has hepatitis B. ? You get hemodialysis treatment. ? You take certain medicines for conditions, including cancer, organ transplantation, and autoimmune conditions.  Hepatitis C  Blood testing is recommended for: ? Everyone born from 1945 through 1965. ? Anyone with known risk factors for hepatitis C.  Sexually transmitted infections (STIs)  You should be screened for sexually transmitted infections (STIs) including gonorrhea and chlamydia if: ? You are sexually active and are younger than 71 years of age. ? You are older than 71 years of age and your health care provider tells you that you are at risk for this type of infection. ? Your sexual activity has changed since you were last screened and you are at an increased risk for chlamydia or gonorrhea. Ask your health care provider if you are at risk.  If you do not have HIV, but are at risk, it may be recommended that you take a prescription medicine daily to prevent HIV infection. This is called  pre-exposure prophylaxis (PrEP). You are considered at risk if: ? You are sexually active and do not regularly use condoms or know the HIV status of your partner(s). ? You take drugs by injection. ? You are sexually active with a partner who has HIV.  Talk with your health care provider about whether you are at high risk of being infected with HIV. If you choose to begin PrEP, you should first be tested for HIV. You should then be   tested every 3 months for as long as you are taking PrEP. Pregnancy  If you are premenopausal and you may become pregnant, ask your health care provider about preconception counseling.  If you may become pregnant, take 400 to 800 micrograms (mcg) of folic acid every day.  If you want to prevent pregnancy, talk to your health care provider about birth control (contraception). Osteoporosis and menopause  Osteoporosis is a disease in which the bones lose minerals and strength with aging. This can result in serious bone fractures. Your risk for osteoporosis can be identified using a bone density scan.  If you are 65 years of age or older, or if you are at risk for osteoporosis and fractures, ask your health care provider if you should be screened.  Ask your health care provider whether you should take a calcium or vitamin D supplement to lower your risk for osteoporosis.  Menopause may have certain physical symptoms and risks.  Hormone replacement therapy may reduce some of these symptoms and risks. Talk to your health care provider about whether hormone replacement therapy is right for you. Follow these instructions at home:  Schedule regular health, dental, and eye exams.  Stay current with your immunizations.  Do not use any tobacco products including cigarettes, chewing tobacco, or electronic cigarettes.  If you are pregnant, do not drink alcohol.  If you are breastfeeding, limit how much and how often you drink alcohol.  Limit alcohol intake to no more  than 1 drink per day for nonpregnant women. One drink equals 12 ounces of beer, 5 ounces of wine, or 1 ounces of hard liquor.  Do not use street drugs.  Do not share needles.  Ask your health care provider for help if you need support or information about quitting drugs.  Tell your health care provider if you often feel depressed.  Tell your health care provider if you have ever been abused or do not feel safe at home. This information is not intended to replace advice given to you by your health care provider. Make sure you discuss any questions you have with your health care provider. Document Released: 03/17/2011 Document Revised: 02/07/2016 Document Reviewed: 06/05/2015 Elsevier Interactive Patient Education  2018 Elsevier Inc.  

## 2018-03-02 ENCOUNTER — Encounter: Payer: Self-pay | Admitting: Internal Medicine

## 2018-03-02 ENCOUNTER — Ambulatory Visit: Payer: Medicare HMO | Admitting: Internal Medicine

## 2018-03-02 VITALS — BP 132/70 | HR 92 | Ht <= 58 in | Wt 104.8 lb

## 2018-03-02 DIAGNOSIS — I10 Essential (primary) hypertension: Secondary | ICD-10-CM | POA: Diagnosis not present

## 2018-03-02 DIAGNOSIS — R9389 Abnormal findings on diagnostic imaging of other specified body structures: Secondary | ICD-10-CM | POA: Diagnosis not present

## 2018-03-02 DIAGNOSIS — R058 Other specified cough: Secondary | ICD-10-CM | POA: Insufficient documentation

## 2018-03-02 DIAGNOSIS — R05 Cough: Secondary | ICD-10-CM

## 2018-03-02 MED ORDER — IRBESARTAN 75 MG PO TABS: 75.0000 mg | ORAL_TABLET | Freq: Every day | ORAL | 2 refills | Status: DC

## 2018-03-02 MED ORDER — BENZONATATE 200 MG PO CAPS: 200.0000 mg | ORAL_CAPSULE | Freq: Three times a day (TID) | ORAL | 1 refills | Status: DC | PRN

## 2018-03-02 NOTE — Patient Instructions (Addendum)
Stop lisinopril and start avapro (ibasartan) 75 mg one daily    For cough > tessalon pearls 200 mg up to three times a day   GERD (REFLUX)  is an extremely common cause of respiratory symptoms just like yours , many times with no obvious heartburn at all.    It can be treated with medication, but also with lifestyle changes including elevation of the head of your bed (ideally with 6 inch  bed blocks),  Smoking cessation, avoidance of late meals, excessive alcohol, and avoid fatty foods, chocolate, peppermint, colas, red wine, and acidic juices such as orange juice.  NO MINT OR MENTHOL PRODUCTS SO NO COUGH DROPS   USE SUGARLESS CANDY INSTEAD (Jolley ranchers or Stover's or Life Savers) or even ice chips will also do - the key is to swallow to prevent all throat clearing. NO OIL BASED VITAMINS - use powdered substitutes.    If not improving as quickly as you want >   Try prilosec otc 20mg   Take 30-60 min before first meal of the day and Pepcid ac (famotidine) 20 mg one @  bedtime until cough is completely gone for at least a week without the need for cough suppression     If not better to your satisfaction in 4 weeks, call for sinus CT and follow up office visit - if all better see Dr Birdie Riddle for follow up of your blood pressure and modify your cough /asthma medications (shorten the list)

## 2018-03-02 NOTE — Assessment & Plan Note (Addendum)
Try off acei 03/02/2018    Upper airway cough syndrome (previously labeled PNDS),  is so named because it's frequently impossible to sort out how much is  CR/sinusitis with freq throat clearing (which can be related to primary GERD)   vs  causing  secondary (" extra esophageal")  GERD from wide swings in gastric pressure that occur with throat clearing, often  promoting self use of mint and menthol lozenges that reduce the lower esophageal sphincter tone and exacerbate the problem further in a cyclical fashion.   These are the same pts (now being labeled as having "irritable larynx syndrome" by some cough centers) who not infrequently have a history of having failed to tolerate ace inhibitors,  dry powder inhalers(and sometimes even low dose ICS HFA like symbicort) or biphosphonates or report having atypical/extraesophageal reflux symptoms that don't respond to standard doses of PPI  and are easily confused as having aecopd or asthma flares by even experienced allergists/ pulmonologists (myself included).   rec d/c symbicort and all inhalers, use tessalon to control cough while awaiting effects of acei to wear off then add gerd rx if not better and f/u here in 4 weeks if not resolved by then completely

## 2018-03-02 NOTE — Assessment & Plan Note (Signed)
Kyphotic changes accentuate appearance of hyper inflation on lateral view especially    I explained the effects of loss of elasticity on lung vol/ cxr assoc with aging and note pt has no limiting doe which would be the consequence of clinically significant hyperinflation so no further w/u needed in this setting.   Pulmonary f/u is prn new symptoms off acei   Total time devoted to counseling  > 50 % of initial 60 min office visit:  review case with pt/husband Tommy and discussion of options/alternatives/ personally creating written customized instructions  in presence of pt  then going over those specific  Instructions directly with the pt including how to use all of the meds but in particular covering each new medication in detail and the difference between the maintenance= "automatic" meds and the prns using an action plan format for the latter (If this problem/symptom => do that organization reading Left to right).  Please see AVS from this visit for a full list of these instructions which I personally wrote for this pt and  are unique to this visit.

## 2018-03-02 NOTE — Progress Notes (Signed)
Subjective:     Patient ID: Ellen Beltran, female   DOB: [birth year],     MRN: [id number]  HPI   60 yowf    Never smoker with tendency to seasonal  watery nasal discharge usually better with allegra overall pattern  better since 2014  but much worse than usual since spring of 2019 assoc cough attributed to pnds rx prednisone/ zpak, zyrtec/ singulair /albuterol/  mucinex dm and nothing has worked so referred to pulmonary clinic 03/02/2018 by Dr   Birdie Riddle p cxr read as copd by radiology 01/27/18     03/02/2018 1st [facility] office visit/ Melvyn Novas   Started on acei 10/28/17  Chief Complaint  Patient presents with  . Pulmonary Consult    Referred by Dr. Birdie Riddle. Pt c/o cough since March 2019, worse over the past 4 wks. Cough is occ prod with clear to white sputum.  She coughs throughout the day and also through the night, sometimes wakes her up.  She has an albuterol inhaler that she rarely uses.   cough is 24/7 min clear mucus production wakes her up most noct but no sob unless coughing   No obvious day to day or daytime variability or assoc excess/ purulent sputum or mucus plugs or hemoptysis or cp or chest tightness, subjective wheeze or overt sinus or hb symptoms. No unusual exposure hx or h/o childhood pna/ asthma or knowledge of premature birth.  Also denies any obvious fluctuation of symptoms with weather or environmental changes or other aggravating or alleviating factors except as outlined above   Current Allergies, Complete Past Medical History, Past Surgical History, Family History, and Social History were reviewed in [employer] record.  ROS  The following are not active complaints unless bolded Hoarseness, sore throat, dysphagia, dental problems, itching, sneezing,  nasal congestion or discharge of excess mucus or purulent secretions, ear ache,   fever, chills, sweats, unintended wt loss or wt gain, classically pleuritic or exertional cp,  orthopnea pnd or  arm/hand swelling  or leg swelling, presyncope, palpitations, abdominal pain, anorexia, nausea, vomiting, diarrhea  or change in bowel habits or change in bladder habits, change in stools or change in urine, dysuria, hematuria,  rash, arthralgias, visual complaints, headache, numbness, weakness or ataxia or problems with walking or coordination,  change in mood or  memory.        Current Meds  Medication Sig  . albuterol (PROVENTIL HFA;VENTOLIN HFA) 108 (90 Base) MCG/ACT inhaler Inhale 2 puffs into the lungs every 6 (six) hours as needed for wheezing or shortness of breath.  . anastrozole (ARIMIDEX) 1 MG tablet TAKE 1 TABLET (1 MG TOTAL) BY MOUTH DAILY.  . Calcium Citrate-Vitamin D (CALCIUM CITRATE + D3 PO) Take by mouth daily.  . cetirizine (ZYRTEC) 10 MG tablet Take 10 mg by mouth daily.  . Cholecalciferol (VITAMIN D PO) Take 5,000 mg by mouth daily.  . fluticasone (FLONASE) 50 MCG/ACT nasal spray Place 1 spray into both nostrils daily as needed. congestion  . meloxicam (MOBIC) 7.5 MG tablet TAKE 1 TABLET BY MOUTH EVERY DAY  . montelukast (SINGULAIR) 10 MG tablet Take 1 tablet (10 mg total) by mouth at bedtime.  . Multiple Vitamins-Minerals (ALIVE WOMENS 50+ PO) Take 1 each by mouth daily.   . Nutritional Supplements (ANTI-INFLAMMATORY ENZYME) CAPS APPLY 1 2 GMS TO AFFECTED AREA 3 4 TIMES DAILY AS NEEDED FOR PAIN  THIS IS A COMPOUNDED CREAM 1 PUMP EQUALS 1 GRAM  . PROLIA 60 MG/ML SOLN  injection Inject 60 mg into the skin every 6 (six) months.  . simvastatin (ZOCOR) 40 MG tablet Take 1 tablet (40 mg total) by mouth every evening.  . [  lisinopril (PRINIVIL,ZESTRIL) 10 MG tablet TAKE 1 TABLET BY MOUTH EVERY DAY       Review of Systems     Objective:   Physical Exam amb wf mild voice fatigue, severe coughing fits better if sips water   Wt Readings from Last 3 Encounters:  03/02/18 104 lb 12.8 oz (47.5 kg)  01/27/18 102 lb 2 oz (46.3 kg)  01/27/18 102 lb 2 oz (46.3 kg)     Vital  signs reviewed - Note on arrival 02 sats  98% on RA      HEENT: nl dentition, turbinates bilaterally, and oropharynx. Nl external ear canals without cough reflex   NECK :  without JVD/Nodes/TM/ nl carotid upstrokes bilaterally   LUNGS: no acc muscle use,  Nl contour chest which is clear to A and P bilaterally without cough on insp or exp maneuvers   CV:  RRR  no s3 or murmur or increase in P2, and no edema   ABD:  soft and nontender with nl inspiratory excursion in the supine position. No bruits or organomegaly appreciated, bowel sounds nl  MS:  Nl gait/ ext warm without deformities, calf tenderness, cyanosis or clubbing No obvious joint restrictions   SKIN: warm and dry without lesions    NEURO:  alert, approp, nl sensorium with  no motor or cerebellar deficits apparent.    . I personally reviewed images and agree with radiology impression as follows:  CXR:   01/27/18 1. Lungs are hyperexpanded suggesting COPD. 2. Chronic bronchitic changes. 3. No acute findings.  No evidence of pneumonia or pulmonary edema. My impression:  Kyphotic changes accentuate appearance of hyper inflation on lateral view especially     Assessment:

## 2018-03-02 NOTE — Assessment & Plan Note (Signed)
Try off acei 03/02/2018 due to cough   In the best review of chronic cough to date ( NEJM 2016 375 [id number]) ,  ACEi are now felt to cause cough in up to  20% of pts which is a 4 fold increase from previous reports and does not include the variety of non-specific complaints we see in pulmonary clinic in pts on ACEi but previously attributed to another dx like  Copd/asthma and  include PNDS, throat and chest congestion, "bronchitis", unexplained dyspnea and noct "strangling" sensations, and hoarseness, but also  atypical /refractory GERD symptoms like dysphagia and "bad heartburn"   The only way I know  to prove this is not an "ACEi Case" is a trial off ACEi x a minimum of 4 weeks then regroup.    Try avapro 75 mg daily and if better f/u with Dr Birdie Riddle,  If not follow up here.

## 2018-03-06 ENCOUNTER — Other Ambulatory Visit: Payer: Self-pay | Admitting: Family Medicine

## 2018-03-10 ENCOUNTER — Other Ambulatory Visit: Payer: Self-pay | Admitting: Family Medicine

## 2018-03-30 ENCOUNTER — Telehealth: Payer: Self-pay | Admitting: Family Medicine

## 2018-03-30 DIAGNOSIS — I10 Essential (primary) hypertension: Secondary | ICD-10-CM

## 2018-03-30 NOTE — Telephone Encounter (Signed)
Copied from [place] [id number]. Topic: Inquiry >> Mar 30, 2018  5:05 PM Cecelia Byars, NT wrote: Reason for CRM: Patient called and said her pharmacy is no longer carrying her  irbesartan (AVAPRO) 75 MG tablet and she needs a prescription to take to another pharmacy and to verify her insurance she would also like this written for a 90 supply ,and she would also like to pick this up on 03/31/18 please advise [phone]

## 2018-03-31 MED ORDER — IRBESARTAN 75 MG PO TABS: 75.0000 mg | ORAL_TABLET | Freq: Every day | ORAL | 1 refills | Status: DC

## 2018-03-31 NOTE — Telephone Encounter (Signed)
rx printed, given to PCP for signature will advise pt available for pick up.

## 2018-03-31 NOTE — Telephone Encounter (Signed)
Ok for 90

## 2018-03-31 NOTE — Telephone Encounter (Signed)
Ok to print a prescription for #90

## 2018-04-13 ENCOUNTER — Other Ambulatory Visit: Payer: Self-pay | Admitting: Internal Medicine

## 2018-04-13 DIAGNOSIS — I10 Essential (primary) hypertension: Secondary | ICD-10-CM

## 2018-04-14 DIAGNOSIS — R69 Illness, unspecified: Secondary | ICD-10-CM | POA: Diagnosis not present

## 2018-04-19 ENCOUNTER — Other Ambulatory Visit: Payer: Self-pay | Admitting: Family Medicine

## 2018-05-04 DIAGNOSIS — N6452 Nipple discharge: Secondary | ICD-10-CM | POA: Diagnosis not present

## 2018-05-04 DIAGNOSIS — Z853 Personal history of malignant neoplasm of breast: Secondary | ICD-10-CM | POA: Diagnosis not present

## 2018-05-07 ENCOUNTER — Other Ambulatory Visit: Payer: Self-pay | Admitting: Family Medicine

## 2018-05-12 DIAGNOSIS — R69 Illness, unspecified: Secondary | ICD-10-CM | POA: Diagnosis not present

## 2018-05-24 ENCOUNTER — Telehealth: Payer: Self-pay | Admitting: Family Medicine

## 2018-05-24 NOTE — Telephone Encounter (Signed)
Called [facility] to verify, ok to deliver patient Prolia. Delivery date is 06/01/18. Patient will be due for her next Prolia on 06/09/18.

## 2018-05-24 NOTE — Telephone Encounter (Signed)
Copied from [place] [id number]. Topic: Quick Communication - See Telephone Encounter >> May 24, 2018  9:19 AM Synthia Innocent wrote: CRM for notification. See Telephone encounter for: 05/24/18. [facility] calling to verify delivery for prolia injection on 06/01/18. Please call back to make sure this ok

## 2018-06-03 DIAGNOSIS — M25571 Pain in right ankle and joints of right foot: Secondary | ICD-10-CM | POA: Diagnosis not present

## 2018-06-03 DIAGNOSIS — M25471 Effusion, right ankle: Secondary | ICD-10-CM | POA: Diagnosis not present

## 2018-06-03 DIAGNOSIS — S82891A Other fracture of right lower leg, initial encounter for closed fracture: Secondary | ICD-10-CM | POA: Diagnosis not present

## 2018-06-03 DIAGNOSIS — S99911A Unspecified injury of right ankle, initial encounter: Secondary | ICD-10-CM | POA: Diagnosis not present

## 2018-06-03 DIAGNOSIS — S8264XA Nondisplaced fracture of lateral malleolus of right fibula, initial encounter for closed fracture: Secondary | ICD-10-CM | POA: Diagnosis not present

## 2018-06-07 DIAGNOSIS — G8918 Other acute postprocedural pain: Secondary | ICD-10-CM | POA: Diagnosis not present

## 2018-06-07 DIAGNOSIS — S8261XA Displaced fracture of lateral malleolus of right fibula, initial encounter for closed fracture: Secondary | ICD-10-CM | POA: Diagnosis not present

## 2018-06-07 DIAGNOSIS — Y999 Unspecified external cause status: Secondary | ICD-10-CM | POA: Diagnosis not present

## 2018-06-15 DIAGNOSIS — S8261XD Displaced fracture of lateral malleolus of right fibula, subsequent encounter for closed fracture with routine healing: Secondary | ICD-10-CM | POA: Diagnosis not present

## 2018-06-17 ENCOUNTER — Ambulatory Visit (INDEPENDENT_AMBULATORY_CARE_PROVIDER_SITE_OTHER): Payer: Medicare HMO | Admitting: Emergency Medicine

## 2018-06-17 DIAGNOSIS — Z23 Encounter for immunization: Secondary | ICD-10-CM

## 2018-06-17 DIAGNOSIS — M810 Age-related osteoporosis without current pathological fracture: Secondary | ICD-10-CM

## 2018-06-17 MED ORDER — DENOSUMAB 60 MG/ML ~~LOC~~ SOSY
60.0000 mg | PREFILLED_SYRINGE | Freq: Once | SUBCUTANEOUS | Status: AC
  Administered 2018-06-17: 60 mg via SUBCUTANEOUS

## 2018-06-17 NOTE — Progress Notes (Signed)
Ellen Beltran is a 72 y.o. female presents to the office today for Prolia injection, per physician's orders. Prolia (med), 60 mg/ml (dose),  SQ (route) was administered Right arm (location) today. Patient tolerated injection. Patient next injection due 12/17/2018  Kajal Scalici S Devynn Scheff,CMA

## 2018-06-18 ENCOUNTER — Other Ambulatory Visit: Payer: Self-pay | Admitting: Family Medicine

## 2018-06-18 DIAGNOSIS — C50112 Malignant neoplasm of central portion of left female breast: Secondary | ICD-10-CM

## 2018-06-18 DIAGNOSIS — Z170 Estrogen receptor positive status [ER+]: Secondary | ICD-10-CM

## 2018-06-18 DIAGNOSIS — M8000XA Age-related osteoporosis with current pathological fracture, unspecified site, initial encounter for fracture: Secondary | ICD-10-CM

## 2018-06-21 ENCOUNTER — Other Ambulatory Visit: Payer: Self-pay | Admitting: Family Medicine

## 2018-06-21 DIAGNOSIS — M8000XA Age-related osteoporosis with current pathological fracture, unspecified site, initial encounter for fracture: Secondary | ICD-10-CM

## 2018-06-21 DIAGNOSIS — Z170 Estrogen receptor positive status [ER+]: Secondary | ICD-10-CM

## 2018-06-21 DIAGNOSIS — C50112 Malignant neoplasm of central portion of left female breast: Secondary | ICD-10-CM

## 2018-07-07 ENCOUNTER — Other Ambulatory Visit: Payer: Self-pay | Admitting: Family Medicine

## 2018-07-20 DIAGNOSIS — S8261XD Displaced fracture of lateral malleolus of right fibula, subsequent encounter for closed fracture with routine healing: Secondary | ICD-10-CM | POA: Diagnosis not present

## 2018-07-21 ENCOUNTER — Encounter: Payer: Self-pay | Admitting: General Practice

## 2018-07-21 ENCOUNTER — Ambulatory Visit (INDEPENDENT_AMBULATORY_CARE_PROVIDER_SITE_OTHER): Payer: Medicare HMO | Admitting: Family Medicine

## 2018-07-21 ENCOUNTER — Other Ambulatory Visit: Payer: Self-pay

## 2018-07-21 ENCOUNTER — Encounter: Payer: Self-pay | Admitting: Family Medicine

## 2018-07-21 VITALS — BP 122/72 | HR 62 | Temp 98.1°F | Resp 16 | Ht <= 58 in

## 2018-07-21 DIAGNOSIS — E785 Hyperlipidemia, unspecified: Secondary | ICD-10-CM

## 2018-07-21 DIAGNOSIS — M8000XD Age-related osteoporosis with current pathological fracture, unspecified site, subsequent encounter for fracture with routine healing: Secondary | ICD-10-CM

## 2018-07-21 DIAGNOSIS — I10 Essential (primary) hypertension: Secondary | ICD-10-CM

## 2018-07-21 LAB — LIPID PANEL
CHOLESTEROL: 216 mg/dL — AB (ref 0–200)
HDL: 73.3 mg/dL (ref >39.00)
LDL CALC: 118 mg/dL — AB (ref 0–99)
NonHDL: 143.02
TRIGLYCERIDES: 125 mg/dL (ref 0.0–149.0)
Total CHOL/HDL Ratio: 3
VLDL: 25 mg/dL (ref 0.0–40.0)

## 2018-07-21 LAB — CBC WITH DIFFERENTIAL/PLATELET
BASOS PCT: 0.8 % (ref 0.0–3.0)
Basophils Absolute: 0.1 10*3/uL (ref 0.0–0.1)
EOS PCT: 3.4 % (ref 0.0–5.0)
Eosinophils Absolute: 0.3 10*3/uL (ref 0.0–0.7)
HCT: 35.3 % — ABNORMAL LOW (ref 36.0–46.0)
Hemoglobin: 11.6 g/dL — ABNORMAL LOW (ref 12.0–15.0)
Lymphocytes Relative: 31.8 % (ref 12.0–46.0)
Lymphs Abs: 3 10*3/uL (ref 0.7–4.0)
MCHC: 32.9 g/dL (ref 30.0–36.0)
MCV: 91.5 fl (ref 78.0–100.0)
MONO ABS: 0.9 10*3/uL (ref 0.1–1.0)
MONOS PCT: 9.4 % (ref 3.0–12.0)
NEUTROS ABS: 5.2 10*3/uL (ref 1.4–7.7)
Neutrophils Relative %: 54.6 % (ref 43.0–77.0)
Platelets: 373 10*3/uL (ref 150.0–400.0)
RBC: 3.86 Mil/uL — AB (ref 3.87–5.11)
RDW: 14.4 % (ref 11.5–15.5)
WBC: 9.6 10*3/uL (ref 4.0–10.5)

## 2018-07-21 LAB — BASIC METABOLIC PANEL
BUN: 21 mg/dL (ref 6–23)
CHLORIDE: 101 meq/L (ref 96–112)
CO2: 28 mEq/L (ref 19–32)
Calcium: 10.3 mg/dL (ref 8.4–10.5)
Creatinine, Ser: 0.83 mg/dL (ref 0.40–1.20)
GFR: 72.03 mL/min (ref >60.00)
Glucose, Bld: 99 mg/dL (ref 70–99)
POTASSIUM: 4.1 meq/L (ref 3.5–5.1)
SODIUM: 138 meq/L (ref 135–145)

## 2018-07-21 LAB — HEPATIC FUNCTION PANEL
ALK PHOS: 62 U/L (ref 39–117)
ALT: 18 U/L (ref 0–35)
AST: 19 U/L (ref 0–37)
Albumin: 4.7 g/dL (ref 3.5–5.2)
BILIRUBIN DIRECT: 0.1 mg/dL (ref 0.0–0.3)
Total Bilirubin: 0.4 mg/dL (ref 0.2–1.2)
Total Protein: 7.5 g/dL (ref 6.0–8.3)

## 2018-07-21 LAB — TSH: TSH: 2.98 u[IU]/mL (ref 0.35–4.50)

## 2018-07-21 MED ORDER — FLUTICASONE PROPIONATE 50 MCG/ACT NA SUSP: 1.0000 | Freq: Every day | NASAL | 2 refills | Status: DC | PRN

## 2018-07-21 NOTE — Assessment & Plan Note (Signed)
Chronic problem.  Tolerating statin w/o difficulty.  Check labs.  Adjust meds prn  

## 2018-07-21 NOTE — Assessment & Plan Note (Signed)
Chronic problem.  On Avapro 75mg  daily w/ good control.  Currently asymptomatic.  Check labs.  No anticipated med changes.  Will follow.

## 2018-07-21 NOTE — Progress Notes (Signed)
   Subjective:    Patient ID: Shanon Rosser, female    DOB: [birth year], 72 y.o.   MRN: [id number]  HPI HTN- chronic problem, on Avapro 75mg  daily w/ good control.  Denies CP, SOB, HAs, visual changes, edema.  Hyperlipidemia- chronic problem, on Simvastatin 40mg  daily.  Denies abd pain, N/V.  Osteoporosis- pt w/ current R fibula fx.  On Prolia.  Following w/ [facility]- Dr Griffin Basil.     Review of Systems For ROS see HPI     Objective:   Physical Exam  Constitutional: She is oriented to person, place, and time. She appears well-developed and well-nourished. No distress.  HENT:  Head: Normocephalic and atraumatic.  Eyes: Pupils are equal, round, and reactive to light. Conjunctivae and EOM are normal.  Neck: Normal range of motion. Neck supple. No thyromegaly present.  Cardiovascular: Normal rate, regular rhythm, normal heart sounds and intact distal pulses.  No murmur heard. Pulmonary/Chest: Effort normal and breath sounds normal. No respiratory distress.  Abdominal: Soft. She exhibits no distension. There is no tenderness.  Musculoskeletal: She exhibits no edema.  Lymphadenopathy:    She has no cervical adenopathy.  Neurological: She is alert and oriented to person, place, and time.  Skin: Skin is warm and dry.  Psychiatric: She has a normal mood and affect. Her behavior is normal.  Vitals reviewed.         Assessment & Plan:

## 2018-07-21 NOTE — Assessment & Plan Note (Signed)
Ongoing issue.  Pt w/ current fibula fx.  On Prolia and UTD on injxns.  Will continue to follow.

## 2018-07-21 NOTE — Patient Instructions (Signed)
Schedule your complete physical in 6 months We'll notify you of your lab results and make any changes if needed Keep up the good work on healthy diet and exercise as able- you look great! BE SAFE!!! Call with any questions or concerns Happy Early Birthday!!!

## 2018-08-16 ENCOUNTER — Other Ambulatory Visit: Payer: Self-pay | Admitting: Family Medicine

## 2018-08-19 ENCOUNTER — Ambulatory Visit
Admission: RE | Admit: 2018-08-19 | Discharge: 2018-08-19 | Disposition: A | Discharge status: Home / Self Care | Payer: Medicare HMO | Source: Ambulatory Visit | Attending: Family Medicine | Admitting: Family Medicine

## 2018-08-19 DIAGNOSIS — C50112 Malignant neoplasm of central portion of left female breast: Secondary | ICD-10-CM

## 2018-08-19 DIAGNOSIS — M8589 Other specified disorders of bone density and structure, multiple sites: Secondary | ICD-10-CM | POA: Diagnosis not present

## 2018-08-19 DIAGNOSIS — M8000XA Age-related osteoporosis with current pathological fracture, unspecified site, initial encounter for fracture: Secondary | ICD-10-CM

## 2018-08-19 DIAGNOSIS — Z780 Asymptomatic menopausal state: Secondary | ICD-10-CM | POA: Diagnosis not present

## 2018-08-19 DIAGNOSIS — R928 Other abnormal and inconclusive findings on diagnostic imaging of breast: Secondary | ICD-10-CM | POA: Diagnosis not present

## 2018-08-19 DIAGNOSIS — Z170 Estrogen receptor positive status [ER+]: Principal | ICD-10-CM

## 2018-08-31 DIAGNOSIS — S8261XD Displaced fracture of lateral malleolus of right fibula, subsequent encounter for closed fracture with routine healing: Secondary | ICD-10-CM | POA: Diagnosis not present

## 2018-09-21 ENCOUNTER — Other Ambulatory Visit: Payer: Self-pay | Admitting: Family Medicine

## 2018-09-21 DIAGNOSIS — I10 Essential (primary) hypertension: Secondary | ICD-10-CM

## 2018-10-12 ENCOUNTER — Other Ambulatory Visit: Payer: Self-pay | Admitting: Family Medicine

## 2018-10-14 ENCOUNTER — Other Ambulatory Visit: Payer: Self-pay | Admitting: Family Medicine

## 2018-10-19 NOTE — Progress Notes (Signed)
Patient Care Team: Midge Minium, MD as PCP - General (Family Medicine) Thea Silversmith, MD as Consulting Physician (Radiation Oncology) Nicholas Lose, MD as Consulting Physician (Hematology and Oncology) Alphonsa Overall, MD as Consulting Physician (General Surgery)  DIAGNOSIS:    ICD-10-CM   1. Malignant neoplasm of central portion of left breast in female, estrogen receptor positive ([place]) C50.112    Z17.0   2. Cancer of central portion of left female breast ([place]) C50.112 anastrozole (ARIMIDEX) 1 MG tablet    SUMMARY OF ONCOLOGIC HISTORY:   Cancer of central portion of left female breast ([place])   04/15/2013 Initial Diagnosis    Left breast biopsy invasive ductal carcinoma with papillary features intermediate to high-grade, ER negative PR negative HER-2 negative Ki-67 47%    04/25/2013 Breast MRI    Left breast 12:00 position 2.4 cm area of abnormality, no lymph nodes    05/13/2013 - 09/02/2013 Neo-Adjuvant Chemotherapy    Adriamycin and Cytoxan 4 cycles followed by Taxol and carboplatin completed 9 out of 12 stopped due to Neuropathy    10/17/2013 Surgery    Left breast lumpectomy: 0.4 cm residual invasive ductal carcinoma 1 sentinel lymph node negative ER 52%, ER 0%, HER-2 negative ratio 1.18    11/14/2013 - 12/30/2013 Radiation Therapy    Adjuvant radiation therapy    01/20/2014 -  Anti-estrogen oral therapy    Arimidex 1 mg daily plan is for 5 years     CHIEF COMPLIANT: Follow-up of anastrozole  INTERVAL HISTORY: Ellen Beltran is a 73 y.o. with above-mentioned history of left breast cancer treated with neoadjuvant chemotherapy followed by lumpectomy, radiation, and is currently on anastrozole therapy. Her most recent bone scan on 08/19/18 showed an improvement in bone density from her last scan at -2.9 to -2.0 and she recieves Prolia injections from her PCP. She presents to the clinic today with her husband. She is interested in staying on Arimidex for 2 more years. Her  husband is currently getting treatment for gastric cancer [facility] at [facility]. She reviewed her medication list with me.   REVIEW OF SYSTEMS:   Constitutional: Denies fevers, chills or abnormal weight loss Eyes: Denies blurriness of vision Ears, nose, mouth, throat, and face: Denies mucositis or sore throat Respiratory: Denies cough, dyspnea or wheezes Cardiovascular: Denies palpitation, chest discomfort Gastrointestinal: Denies nausea, heartburn or change in bowel habits Skin: Denies abnormal skin rashes Lymphatics: Denies new lymphadenopathy or easy bruising Neurological: Denies numbness, tingling or new weaknesses Behavioral/Psych: Mood is stable, no new changes  Extremities: No lower extremity edema Breast: denies any pain or lumps or nodules in either breasts All other systems were reviewed with the patient and are negative.  I have reviewed the past medical history, past surgical history, social history and family history with the patient and they are unchanged from previous note.  ALLERGIES:  is allergic to codeine; morphine and related; and sulfa antibiotics.  MEDICATIONS:  Current Outpatient Medications  Medication Sig Dispense Refill  . anastrozole (ARIMIDEX) 1 MG tablet TAKE 1 TABLET (1 MG TOTAL) BY MOUTH DAILY. 90 tablet 3  . Calcium Citrate-Vitamin D (CALCIUM CITRATE + D3 PO) Take by mouth daily.    . cetirizine (ZYRTEC) 10 MG tablet Take 1 tablet (10 mg total) by mouth daily as needed for allergies.    . Cholecalciferol (VITAMIN D PO) Take 5,000 mg by mouth daily.    . fluticasone (FLONASE) 50 MCG/ACT nasal spray PLACE 1 SPRAY INTO BOTH NOSTRILS DAILY AS NEEDED. CONGESTION  16 g 0  . irbesartan (AVAPRO) 75 MG tablet TAKE 1 TABLET BY MOUTH DAILY 90 tablet 0  . meloxicam (MOBIC) 7.5 MG tablet TAKE 1 TABLET BY MOUTH EVERY DAY 30 tablet 1  . Multiple Vitamins-Minerals (ALIVE WOMENS 50+ PO) Take 1 each by mouth daily.     . Nutritional Supplements (ANTI-INFLAMMATORY ENZYME) CAPS  APPLY 1 2 GMS TO AFFECTED AREA 3 4 TIMES DAILY AS NEEDED FOR PAIN  THIS IS A COMPOUNDED CREAM 1 PUMP EQUALS 1 GRAM  11  . PROLIA 60 MG/ML SOLN injection Inject 60 mg into the skin every 6 (six) months. 1 mL 1  . simvastatin (ZOCOR) 40 MG tablet TAKE 1 TABLET (40 MG TOTAL) BY MOUTH EVERY EVENING. 90 tablet 2   No current facility-administered medications for this visit.     PHYSICAL EXAMINATION: ECOG PERFORMANCE STATUS: 1 - Symptomatic but completely ambulatory  Vitals:   10/20/18 0819  BP: (!) 146/67  Pulse: 67  Resp: 17  Temp: 98.5 F (36.9 C)  SpO2: 99%   Filed Weights   10/20/18 0819  Weight: 48.9 kg    GENERAL: alert, no distress and comfortable SKIN: skin color, texture, turgor are normal, no rashes or significant lesions EYES: normal, Conjunctiva are pink and non-injected, sclera clear OROPHARYNX: no exudate, no erythema and lips, buccal mucosa, and tongue normal  NECK: supple, thyroid normal size, non-tender, without nodularity LYMPH: no palpable lymphadenopathy in the cervical, axillary or inguinal LUNGS: clear to auscultation and percussion with normal breathing effort HEART: regular rate & rhythm and no murmurs and no lower extremity edema ABDOMEN: abdomen soft, non-tender and normal bowel sounds MUSCULOSKELETAL: no cyanosis of digits and no clubbing  NEURO: alert & oriented x 3 with fluent speech, no focal motor/sensory deficits EXTREMITIES: No lower extremity edema BREAST: Scar tissue is palpable in the left breast at the prior surgical site.  No concerning findings are noted.Marland Kitchen No palpable axillary supraclavicular or infraclavicular adenopathy no breast tenderness or nipple discharge. (exam performed in the presence of a chaperone)  LABORATORY DATA:  I have reviewed the data as listed CMP Latest Ref Rng & Units 07/21/2018 01/27/2018 11/18/2017  Glucose 70 - 99 mg/dL 99 105(H) 93  BUN 6 - 23 mg/dL _0 Creatinine 0.40 - 1.20 mg/dL 0.83 0.86 0.85  Sodium 135 -  145 mEq/L 138 136 139  Potassium 3.5 - 5.1 mEq/L 4.1 3.9 4.4  Chloride 96 - 112 mEq/L 101 98 100  CO2 19 - 32 mEq/L _1 Calcium 8.4 - 10.5 mg/dL 10.3 9.4 10.1  Total Protein 6.0 - 8.3 g/dL 7.5 7.0 -  Total Bilirubin 0.2 - 1.2 mg/dL 0.4 0.3 -  Alkaline Phos 39 - 117 U/L 62 62 -  AST 0 - 37 U/L 19 18 -  ALT 0 - 35 U/L 18 15 -    Lab Results  Component Value Date   WBC 9.6 07/21/2018   HGB 11.6 (L) 07/21/2018   HCT 35.3 (L) 07/21/2018   MCV 91.5 07/21/2018   PLT 373.0 07/21/2018   NEUTROABS 5.2 07/21/2018    ASSESSMENT & PLAN:  Cancer of central portion of left female breast Left breast invasive ductal carcinoma clinical stage IIA initially triple negative with a Ki-67 of 47% underwent neoadjuvant chemotherapy followed by left breast lumpectomy, residual 0.4 cm tumor but it was ER positive PR negative HER-2 negative, completed adjuvant radiation therapy and is currently on Arimidex 1 mg daily since May 2015  Arimidex-related toxicities: 1.Osteoporosis: T score minus 2 on  08/19/2018: Currently on Prolia injections along with calcium and vitamin D 2. Intermittent hot flashes I recommended extended adjuvant therapy to go to 7 years.  Patient is willing to take it. Monitoring closely for toxicities  Breast cancer surveillance: 1. Mammogram 12/5/2019was normal, breast density category B 2. Breast exam 10/19/2017 is normal 3. Bone density12/01/2018: Osteoporosis T score -2 improved from -2.9  Neuropathyrelated to prior breast cancer therapy: Off neurontin   Patient's husband was diagnosed with gastric cancer and is undergoing chemotherapy for metastatic gastric cancer Return to clinic in one year for follow-up    No orders of the defined types were placed in this encounter.  The patient has a good understanding of the overall plan. she agrees with it. she will call with any problems that may develop before the next visit here.  Nicholas Lose, MD 10/20/2018  Julious Oka  Dorshimer am acting as scribe for Dr. Nicholas Lose.  I have reviewed the above documentation for accuracy and completeness, and I agree with the above.

## 2018-10-20 ENCOUNTER — Inpatient Hospital Stay: Payer: Medicare HMO | Attending: Hematology and Oncology | Admitting: Hematology and Oncology

## 2018-10-20 ENCOUNTER — Telehealth: Payer: Self-pay | Admitting: Hematology and Oncology

## 2018-10-20 DIAGNOSIS — G629 Polyneuropathy, unspecified: Secondary | ICD-10-CM | POA: Insufficient documentation

## 2018-10-20 DIAGNOSIS — C50112 Malignant neoplasm of central portion of left female breast: Secondary | ICD-10-CM | POA: Diagnosis not present

## 2018-10-20 DIAGNOSIS — Z79811 Long term (current) use of aromatase inhibitors: Secondary | ICD-10-CM | POA: Insufficient documentation

## 2018-10-20 DIAGNOSIS — Z791 Long term (current) use of non-steroidal anti-inflammatories (NSAID): Secondary | ICD-10-CM

## 2018-10-20 DIAGNOSIS — R232 Flushing: Secondary | ICD-10-CM | POA: Diagnosis not present

## 2018-10-20 DIAGNOSIS — Z9221 Personal history of antineoplastic chemotherapy: Secondary | ICD-10-CM | POA: Diagnosis not present

## 2018-10-20 DIAGNOSIS — Z923 Personal history of irradiation: Secondary | ICD-10-CM

## 2018-10-20 DIAGNOSIS — Z79899 Other long term (current) drug therapy: Secondary | ICD-10-CM | POA: Diagnosis not present

## 2018-10-20 DIAGNOSIS — M810 Age-related osteoporosis without current pathological fracture: Secondary | ICD-10-CM

## 2018-10-20 DIAGNOSIS — Z170 Estrogen receptor positive status [ER+]: Secondary | ICD-10-CM | POA: Diagnosis not present

## 2018-10-20 MED ORDER — CETIRIZINE HCL 10 MG PO TABS: 10.0000 mg | ORAL_TABLET | Freq: Every day | ORAL | Status: AC | PRN

## 2018-10-20 MED ORDER — ANASTROZOLE 1 MG PO TABS: ORAL_TABLET | ORAL | 3 refills | Status: DC

## 2018-10-20 NOTE — Assessment & Plan Note (Addendum)
Left breast invasive ductal carcinoma clinical stage IIA initially triple negative with a Ki-67 of 47% underwent neoadjuvant chemotherapy followed by left breast lumpectomy, residual 0.4 cm tumor but it was ER positive PR negative HER-2 negative, completed adjuvant radiation therapy and is currently on Arimidex 1 mg daily since May 2015  Arimidex-related toxicities: 1.Osteoporosis: T score minus 2 on  08/19/2018: Currently on Prolia injections along with calcium and vitamin D 2. Intermittent hot flashes I recommended extended adjuvant therapy to go to 7 years.  Patient is willing to take it. Monitoring closely for toxicities  Breast cancer surveillance: 1. Mammogram 12/5/2019was normal, breast density category B 2. Breast exam 10/19/2017 is normal 3. Bone density12/01/2018: Osteoporosis T score -2 improved from -2.9  Neuropathyrelated to prior breast cancer therapy: Off neurontin   Patient's husband was diagnosed with gastric cancer and is undergoing chemotherapy for metastatic gastric cancer Return to clinic in one year for follow-up

## 2018-10-20 NOTE — Telephone Encounter (Signed)
Gave avs and calendar ° °

## 2018-11-26 ENCOUNTER — Other Ambulatory Visit: Payer: Self-pay | Admitting: Family Medicine

## 2018-11-26 MED ORDER — DENOSUMAB 60 MG/ML ~~LOC~~ SOSY: 60.0000 mg | PREFILLED_SYRINGE | SUBCUTANEOUS | 1 refills | Status: DC

## 2018-11-26 NOTE — Telephone Encounter (Signed)
rx sent to [facility].

## 2018-11-26 NOTE — Telephone Encounter (Signed)
Routing to Office- [employer] new rx sent to [facility].

## 2018-11-26 NOTE — Addendum Note (Signed)
Addended by: Davis Gourd on: 11/26/2018 02:38 PM   Modules accepted: Orders

## 2018-11-26 NOTE — Telephone Encounter (Signed)
Copied from [place] [id number]. Topic: Quick Communication - Rx Refill/Question >> Nov 26, 2018  2:08 PM Burchel, Abbi R wrote: Medication: Prolia   Preferred Pharmacy: [facility] (ph: [phone])  Pt states pharmacy is requesting a new rx for Prolia shot before they will ship it to the office for pt.  Please advise.

## 2018-12-07 ENCOUNTER — Telehealth: Payer: Self-pay | Admitting: General Practice

## 2018-12-07 NOTE — Telephone Encounter (Signed)
PA began through [facility]. Paper copy.

## 2018-12-20 NOTE — Telephone Encounter (Signed)
Pt called in today to see if her prolia was in the office/ I was advise it needed PA and when it arrived someone would call Pt to schedule. Pt states it was authorized last week and paid for and she was told it would be at the office by 4.3.20/ please advise

## 2018-12-21 ENCOUNTER — Other Ambulatory Visit: Payer: Self-pay | Admitting: Family Medicine

## 2018-12-22 ENCOUNTER — Ambulatory Visit (INDEPENDENT_AMBULATORY_CARE_PROVIDER_SITE_OTHER): Payer: Medicare HMO

## 2018-12-22 DIAGNOSIS — M159 Polyosteoarthritis, unspecified: Secondary | ICD-10-CM | POA: Diagnosis not present

## 2018-12-22 MED ORDER — DENOSUMAB 60 MG/ML ~~LOC~~ SOSY
60.0000 mg | PREFILLED_SYRINGE | Freq: Once | SUBCUTANEOUS | Status: AC
  Administered 2018-12-22: 60 mg via SUBCUTANEOUS

## 2018-12-22 NOTE — Addendum Note (Signed)
Addended by: Midge Minium on: 12/22/2018 09:07 AM   Modules accepted: Level of Service

## 2018-12-22 NOTE — Progress Notes (Signed)
Prolia order was mine.  Annye Asa, MD

## 2018-12-22 NOTE — Progress Notes (Signed)
Administered Prolia injection subq right arm. Patient tolerated well.

## 2018-12-24 ENCOUNTER — Other Ambulatory Visit: Payer: Self-pay | Admitting: Family Medicine

## 2018-12-24 DIAGNOSIS — I10 Essential (primary) hypertension: Secondary | ICD-10-CM

## 2019-02-02 ENCOUNTER — Encounter: Payer: Medicare HMO | Admitting: Family Medicine

## 2019-02-02 ENCOUNTER — Ambulatory Visit: Payer: Medicare HMO

## 2019-03-02 ENCOUNTER — Other Ambulatory Visit: Payer: Self-pay | Admitting: Family Medicine

## 2019-03-21 ENCOUNTER — Other Ambulatory Visit: Payer: Self-pay | Admitting: Family Medicine

## 2019-03-21 DIAGNOSIS — I10 Essential (primary) hypertension: Secondary | ICD-10-CM

## 2019-04-01 ENCOUNTER — Ambulatory Visit (INDEPENDENT_AMBULATORY_CARE_PROVIDER_SITE_OTHER): Payer: Medicare HMO | Admitting: Family Medicine

## 2019-04-01 ENCOUNTER — Encounter: Payer: Self-pay | Admitting: Family Medicine

## 2019-04-01 ENCOUNTER — Other Ambulatory Visit: Payer: Self-pay

## 2019-04-01 DIAGNOSIS — R590 Localized enlarged lymph nodes: Secondary | ICD-10-CM

## 2019-04-01 NOTE — Progress Notes (Signed)
Virtual Visit via Video   I connected with patient on 04/01/19 at  2:00 PM EDT by a video enabled telemedicine application and verified that I am speaking with the correct person using two identifiers.  Location patient: Home Location provider: [facility], Office Persons participating in the virtual visit: Patient, Provider, [place] (Jess B)  I discussed the limitations of evaluation and management by telemedicine and the availability of in person appointments. The patient expressed understanding and agreed to proceed.  Subjective:   HPI:   Lymph Node- pt reports 'a knot came up' in front of her L ear 'about 4 days ago.  Then had some 'swelling' under angle of mandible.  + nasal congestion.  No HA, no fever, ear pain, sore throat.  No TTP over sinuses.  LN is TTP but somewhat improved compared to earlier this week.  No tooth pain or problems.  Using Flonase.  Stopped Zyrtec.  Reports working out in the yard quite a bit this week  ROS:   See pertinent positives and negatives per HPI.  Patient Active Problem List   Diagnosis Date Noted  . Upper airway cough syndrome 03/02/2018  . Abnormal CXR 03/02/2018  . Allergic rhinitis 12/10/2017  . Osteoarthritis 11/18/2017  . Essential hypertension 07/22/2017  . Osteoporosis 10/22/2016  . Hyperlipidemia 01/17/2016  . Physical exam 01/17/2016  . Anemia of chronic disease 09/23/2013  . Cancer of central portion of left female breast ([place]) 04/20/2013    Social History   Tobacco Use  . Smoking status: Never Smoker  . Smokeless tobacco: Never Used  Substance Use Topics  . Alcohol use: Yes    Current Outpatient Medications:  .  anastrozole (ARIMIDEX) 1 MG tablet, TAKE 1 TABLET (1 MG TOTAL) BY MOUTH DAILY., Disp: 90 tablet, Rfl: 3 .  Calcium Citrate-Vitamin D (CALCIUM CITRATE + D3 PO), Take by mouth daily., Disp: , Rfl:  .  cetirizine (ZYRTEC) 10 MG tablet, Take 1 tablet (10 mg total) by mouth daily as needed for allergies., Disp:  , Rfl:  .  Cholecalciferol (VITAMIN D PO), Take 5,000 mg by mouth daily., Disp: , Rfl:  .  denosumab (PROLIA) 60 MG/ML SOSY injection, Inject 60 mg into the skin every 6 (six) months., Disp: 1 each, Rfl: 1 .  fluticasone (FLONASE) 50 MCG/ACT nasal spray, PLACE 1 SPRAY INTO BOTH NOSTRILS DAILY AS NEEDED. CONGESTION, Disp: 16 g, Rfl: 0 .  irbesartan (AVAPRO) 75 MG tablet, Take 1 tablet by mouth once daily, Disp: 90 tablet, Rfl: 0 .  meloxicam (MOBIC) 7.5 MG tablet, TAKE 1 TABLET BY MOUTH EVERY DAY, Disp: 30 tablet, Rfl: 1 .  Multiple Vitamins-Minerals (ALIVE WOMENS 50+ PO), Take 1 each by mouth daily. , Disp: , Rfl:  .  Nutritional Supplements (ANTI-INFLAMMATORY ENZYME) CAPS, APPLY 1 2 GMS TO AFFECTED AREA 3 4 TIMES DAILY AS NEEDED FOR PAIN  THIS IS A COMPOUNDED CREAM 1 PUMP EQUALS 1 GRAM, Disp: , Rfl: 11 .  simvastatin (ZOCOR) 40 MG tablet, TAKE 1 TABLET (40 MG TOTAL) BY MOUTH EVERY EVENING., Disp: 90 tablet, Rfl: 2  Allergies  Allergen Reactions  . Codeine Other (See Comments)    HALLUCINATIONS  . Morphine And Related Nausea Only  . Sulfa Antibiotics Rash    Objective:   There were no vitals taken for this visit.  AAOx3, NAD NCAT, EOMI No obvious CN deficits Coloring WNL Pt is able to speak clearly, coherently without shortness of breath or increased work of breathing.  Thought process  is linear.  Mood is appropriate.   Assessment and Plan:   Swollen lymph node- new.  Area improving in size and tenderness.  No other sxs at this time.  Suspect this is reactive and triggered by allergy inflammation.  No need for abx at this time.  Discussed supportive measures and red flags that should prompt return.  Pt expressed understanding and is in agreement w/ plan.   Annye Asa, MD 04/01/2019

## 2019-04-01 NOTE — Progress Notes (Signed)
I have discussed the procedure for the virtual visit with the patient who has given consent to proceed with assessment and treatment.   Pt unable to get vitals.   Davis Gourd, CMA

## 2019-04-26 ENCOUNTER — Other Ambulatory Visit: Payer: Self-pay | Admitting: Family Medicine

## 2019-05-11 DIAGNOSIS — Z853 Personal history of malignant neoplasm of breast: Secondary | ICD-10-CM | POA: Diagnosis not present

## 2019-05-11 DIAGNOSIS — N6452 Nipple discharge: Secondary | ICD-10-CM | POA: Diagnosis not present

## 2019-06-09 ENCOUNTER — Encounter: Payer: Medicare HMO | Admitting: Family Medicine

## 2019-06-09 ENCOUNTER — Ambulatory Visit: Payer: Medicare HMO

## 2019-06-10 ENCOUNTER — Other Ambulatory Visit: Payer: Self-pay

## 2019-06-10 ENCOUNTER — Ambulatory Visit (INDEPENDENT_AMBULATORY_CARE_PROVIDER_SITE_OTHER): Payer: Medicare HMO | Admitting: Family Medicine

## 2019-06-10 ENCOUNTER — Encounter: Payer: Self-pay | Admitting: Family Medicine

## 2019-06-10 VITALS — BP 132/80 | HR 81 | Temp 97.8°F | Resp 16 | Ht <= 58 in | Wt 104.2 lb

## 2019-06-10 DIAGNOSIS — Z23 Encounter for immunization: Secondary | ICD-10-CM

## 2019-06-10 DIAGNOSIS — E785 Hyperlipidemia, unspecified: Secondary | ICD-10-CM | POA: Diagnosis not present

## 2019-06-10 DIAGNOSIS — Z0000 Encounter for general adult medical examination without abnormal findings: Secondary | ICD-10-CM

## 2019-06-10 DIAGNOSIS — I10 Essential (primary) hypertension: Secondary | ICD-10-CM | POA: Diagnosis not present

## 2019-06-10 DIAGNOSIS — C50112 Malignant neoplasm of central portion of left female breast: Secondary | ICD-10-CM

## 2019-06-10 DIAGNOSIS — Z170 Estrogen receptor positive status [ER+]: Secondary | ICD-10-CM

## 2019-06-10 DIAGNOSIS — M810 Age-related osteoporosis without current pathological fracture: Secondary | ICD-10-CM

## 2019-06-10 LAB — CBC WITH DIFFERENTIAL/PLATELET
Basophils Absolute: 0 10*3/uL (ref 0.0–0.1)
Basophils Relative: 0.6 % (ref 0.0–3.0)
Eosinophils Absolute: 0.1 10*3/uL (ref 0.0–0.7)
Eosinophils Relative: 1.3 % (ref 0.0–5.0)
HCT: 36.3 % (ref 36.0–46.0)
Hemoglobin: 11.8 g/dL — ABNORMAL LOW (ref 12.0–15.0)
Lymphocytes Relative: 29 % (ref 12.0–46.0)
Lymphs Abs: 2.3 10*3/uL (ref 0.7–4.0)
MCHC: 32.6 g/dL (ref 30.0–36.0)
MCV: 90.9 fl (ref 78.0–100.0)
Monocytes Absolute: 0.9 10*3/uL (ref 0.1–1.0)
Monocytes Relative: 10.9 % (ref 3.0–12.0)
Neutro Abs: 4.6 10*3/uL (ref 1.4–7.7)
Neutrophils Relative %: 58.2 % (ref 43.0–77.0)
Platelets: 342 10*3/uL (ref 150.0–400.0)
RBC: 3.99 Mil/uL (ref 3.87–5.11)
RDW: 14.1 % (ref 11.5–15.5)
WBC: 7.9 10*3/uL (ref 4.0–10.5)

## 2019-06-10 LAB — HEPATIC FUNCTION PANEL
ALT: 17 U/L (ref 0–35)
AST: 18 U/L (ref 0–37)
Albumin: 4.7 g/dL (ref 3.5–5.2)
Alkaline Phosphatase: 67 U/L (ref 39–117)
Bilirubin, Direct: 0.1 mg/dL (ref 0.0–0.3)
Total Bilirubin: 0.4 mg/dL (ref 0.2–1.2)
Total Protein: 7 g/dL (ref 6.0–8.3)

## 2019-06-10 LAB — LIPID PANEL
Cholesterol: 202 mg/dL — ABNORMAL HIGH (ref 0–200)
HDL: 74.6 mg/dL (ref >39.00)
LDL Cholesterol: 100 mg/dL — ABNORMAL HIGH (ref 0–99)
NonHDL: 127.74
Total CHOL/HDL Ratio: 3
Triglycerides: 139 mg/dL (ref 0.0–149.0)
VLDL: 27.8 mg/dL (ref 0.0–40.0)

## 2019-06-10 LAB — BASIC METABOLIC PANEL
BUN: 13 mg/dL (ref 6–23)
CO2: 29 mEq/L (ref 19–32)
Calcium: 10.4 mg/dL (ref 8.4–10.5)
Chloride: 102 mEq/L (ref 96–112)
Creatinine, Ser: 0.87 mg/dL (ref 0.40–1.20)
GFR: 64.03 mL/min (ref >60.00)
Glucose, Bld: 94 mg/dL (ref 70–99)
Potassium: 4.4 mEq/L (ref 3.5–5.1)
Sodium: 141 mEq/L (ref 135–145)

## 2019-06-10 LAB — VITAMIN D 25 HYDROXY (VIT D DEFICIENCY, FRACTURES): VITD: 98.43 ng/mL (ref 30.00–100.00)

## 2019-06-10 LAB — TSH: TSH: 2.65 u[IU]/mL (ref 0.35–4.50)

## 2019-06-10 MED ORDER — PROLIA 60 MG/ML ~~LOC~~ SOSY: 60.0000 mg | PREFILLED_SYRINGE | SUBCUTANEOUS | 1 refills | Status: DC

## 2019-06-10 NOTE — Progress Notes (Signed)
   Subjective:    Patient ID: Ellen Beltran, female    DOB: 25-Feb-1946, 73 y.o.   MRN: [id number]  HPI CPE- UTD on colonoscopy, mammo, Tdap, pneumonia vaccines.  Flu shot tdoay.  Here today for [place].  Risk Factors: HTN- chronic problem, on Irbesartan 75mg  daily w/ good control Hyperlipidemia- chronic problem, on Simvastatin 40mg  daily Osteoporosis- chronic problem, on Prolia and UTD on DEXA Physical Activity: not exercising regularly Fall Risk: low Depression: denies current sxs, husband going through palliative txs for cancer at [facility] Hearing: normal to conversational tones, mildly decreased to whispered voice ADL's: independent Cognitive: normal linear thought process, memory and attention intact Home Safety: Safe at home. Height, Weight, BMI, Visual Acuity: see vitals, vision corrected to 20/20 w/ glasses Counseling: UTD on colonoscopy, mammo, DEXA, immunizations. Labs Ordered: See A&P Care Plan: See A&P   Patient Care Team    Relationship Specialty Notifications Start End  Midge Minium, MD PCP - General Family Medicine  01/17/16   Thea Silversmith, MD Consulting Physician Radiation Oncology  05/02/13   Nicholas Lose, MD Consulting Physician Hematology and Oncology  01/17/16   Alphonsa Overall, MD Consulting Physician General Surgery  01/27/18      Review of Systems Patient reports no vision/ hearing changes, adenopathy,fever, weight change,  persistant/recurrent hoarseness , swallowing issues, chest pain, palpitations, edema, persistant/recurrent cough, hemoptysis, dyspnea (rest/exertional/paroxysmal nocturnal), gastrointestinal bleeding (melena, rectal bleeding), abdominal pain, significant heartburn, bowel changes, GU symptoms (dysuria, hematuria, incontinence), Gyn symptoms (abnormal  bleeding, pain),  syncope, focal weakness, memory loss, skin/hair/nail changes, abnormal bruising or bleeding, anxiety, or depression.     Objective:   Physical Exam General Appearance:     Alert, cooperative, no distress, appears stated age  Head:    Normocephalic, without obvious abnormality, atraumatic  Eyes:    PERRL, conjunctiva/corneas clear, EOM's intact, fundi    benign, both eyes  Ears:    Normal TM's and external ear canals, both ears  Nose:   Deferred due to COVID  Throat:   Neck:   Supple, symmetrical, trachea midline, no adenopathy;    Thyroid: no enlargement/tenderness/nodules  Back:     Symmetric, no curvature, ROM normal, no CVA tenderness  Lungs:     Clear to auscultation bilaterally, respirations unlabored  Chest Wall:    No tenderness or deformity   Heart:    Regular rate and rhythm, S1 and S2 normal, no murmur, rub   or gallop  Breast Exam:    Deferred to mammo  Abdomen:     Soft, non-tender, bowel sounds active all four quadrants,    no masses, no organomegaly  Genitalia:    Deferred  Rectal:    Extremities:   Extremities normal, atraumatic, no cyanosis or edema  Pulses:   2+ and symmetric all extremities  Skin:   Skin color, texture, turgor normal, no rashes or lesions  Lymph nodes:   Cervical, supraclavicular, and axillary nodes normal  Neurologic:   CNII-XII intact, normal strength, sensation and reflexes    throughout          Assessment & Plan:

## 2019-06-10 NOTE — Patient Instructions (Signed)
Follow up in 6 months to recheck BP and cholesterol We'll notify you of your lab results and make any changes if needed Continue to work on healthy diet and regular exercise- you look great!! Call with any questions or concerns Stay Safe!  Hang in there!!!

## 2019-06-13 ENCOUNTER — Encounter: Payer: Self-pay | Admitting: General Practice

## 2019-06-20 ENCOUNTER — Other Ambulatory Visit: Payer: Self-pay | Admitting: Family Medicine

## 2019-06-20 DIAGNOSIS — I10 Essential (primary) hypertension: Secondary | ICD-10-CM

## 2019-06-27 ENCOUNTER — Ambulatory Visit (INDEPENDENT_AMBULATORY_CARE_PROVIDER_SITE_OTHER): Payer: Medicare HMO

## 2019-06-27 ENCOUNTER — Other Ambulatory Visit: Payer: Self-pay

## 2019-06-27 DIAGNOSIS — M810 Age-related osteoporosis without current pathological fracture: Secondary | ICD-10-CM | POA: Diagnosis not present

## 2019-06-27 MED ORDER — DENOSUMAB 60 MG/ML ~~LOC~~ SOSY
60.0000 mg | PREFILLED_SYRINGE | Freq: Once | SUBCUTANEOUS | Status: AC
  Administered 2019-06-27: 11:00:00 60 mg via SUBCUTANEOUS

## 2019-06-27 NOTE — Addendum Note (Signed)
Addended by: Midge Minium on: 06/27/2019 11:34 AM   Modules accepted: Level of Service

## 2019-06-27 NOTE — Progress Notes (Addendum)
Ellen Beltran 74 y.o. female presents to office today for 6 month Prolia injection per PCP, Annye Asa, MD. Administered DENOSUMAB 60 mg/mL subcutaneous right arm. Patient tolerated well. Aware to return in 6 months (April 2021) for next injection.   The above order is mine.  Annye Asa, MD

## 2019-07-05 ENCOUNTER — Other Ambulatory Visit: Payer: Self-pay | Admitting: Family Medicine

## 2019-08-22 ENCOUNTER — Other Ambulatory Visit: Payer: Self-pay

## 2019-08-22 ENCOUNTER — Ambulatory Visit
Admission: RE | Admit: 2019-08-22 | Discharge: 2019-08-22 | Disposition: A | Discharge status: Home / Self Care | Payer: Medicare HMO | Source: Ambulatory Visit | Attending: Family Medicine | Admitting: Family Medicine

## 2019-08-22 DIAGNOSIS — Z170 Estrogen receptor positive status [ER+]: Secondary | ICD-10-CM

## 2019-08-22 DIAGNOSIS — C50112 Malignant neoplasm of central portion of left female breast: Secondary | ICD-10-CM

## 2019-08-22 DIAGNOSIS — R928 Other abnormal and inconclusive findings on diagnostic imaging of breast: Secondary | ICD-10-CM | POA: Diagnosis not present

## 2019-09-01 ENCOUNTER — Other Ambulatory Visit: Payer: Self-pay | Admitting: Family Medicine

## 2019-09-19 ENCOUNTER — Other Ambulatory Visit: Payer: Self-pay | Admitting: Family Medicine

## 2019-09-19 DIAGNOSIS — I10 Essential (primary) hypertension: Secondary | ICD-10-CM

## 2019-10-20 NOTE — Progress Notes (Signed)
Patient Care Team: Midge Minium, MD as PCP - General (Family Medicine) Thea Silversmith, MD as Consulting Physician (Radiation Oncology) Nicholas Lose, MD as Consulting Physician (Hematology and Oncology) Alphonsa Overall, MD as Consulting Physician (General Surgery)  DIAGNOSIS:    ICD-10-CM   1. Malignant neoplasm of central portion of left breast in female, estrogen receptor positive ([place])  C50.112    Z17.0     SUMMARY OF ONCOLOGIC HISTORY: Oncology History  Cancer of central portion of left female breast ([place])  04/15/2013 Initial Diagnosis   Left breast biopsy invasive ductal carcinoma with papillary features intermediate to high-grade, ER negative PR negative HER-2 negative Ki-67 47%   04/25/2013 Breast MRI   Left breast 12:00 position 2.4 cm area of abnormality, no lymph nodes   05/13/2013 - 09/02/2013 Neo-Adjuvant Chemotherapy   Adriamycin and Cytoxan 4 cycles followed by Taxol and carboplatin completed 9 out of 12 stopped due to Neuropathy   10/17/2013 Surgery   Left breast lumpectomy: 0.4 cm residual invasive ductal carcinoma 1 sentinel lymph node negative ER 52%, ER 0%, HER-2 negative ratio 1.18   11/14/2013 - 12/30/2013 Radiation Therapy   Adjuvant radiation therapy   01/20/2014 -  Anti-estrogen oral therapy   Arimidex 1 mg daily plan is for 5 years     CHIEF COMPLIANT: Follow-up of left breast cancer on anastrozole  INTERVAL HISTORY: Ellen Beltran is a 74 y.o. with above-mentioned history of left breast cancer treated with neoadjuvant chemotherapy, lumpectomy, radiation, and who is currently on anastrozole therapy. Mammogram on 08/22/19 showed no evidence of malignancy bilaterally. She presents to the clinic today for annual follow-up.  Her husband passed away Sep 03, 2023 from metastatic gastric cancer.  She is grieving from his loss.  She is tolerating anastrozole extremely well.  ALLERGIES:  is allergic to codeine; morphine and related; and sulfa  antibiotics.  MEDICATIONS:  Current Outpatient Medications  Medication Sig Dispense Refill  . anastrozole (ARIMIDEX) 1 MG tablet TAKE 1 TABLET (1 MG TOTAL) BY MOUTH DAILY. 90 tablet 3  . Calcium Citrate-Vitamin D (CALCIUM CITRATE + D3 PO) Take by mouth daily.    . cetirizine (ZYRTEC) 10 MG tablet Take 1 tablet (10 mg total) by mouth daily as needed for allergies.    . Cholecalciferol (VITAMIN D PO) Take 5,000 mg by mouth daily.    Marland Kitchen denosumab (PROLIA) 60 MG/ML SOSY injection Inject 60 mg into the skin every 6 (six) months. 1 mL 1  . fluticasone (FLONASE) 50 MCG/ACT nasal spray PLACE 1 SPRAY INTO BOTH NOSTRILS DAILY AS NEEDED. CONGESTION 16 g 0  . irbesartan (AVAPRO) 75 MG tablet Take 1 tablet by mouth once daily 90 tablet 0  . meloxicam (MOBIC) 7.5 MG tablet TAKE 1 TABLET BY MOUTH EVERY DAY 30 tablet 1  . Multiple Vitamins-Minerals (ALIVE WOMENS 50+ PO) Take 1 each by mouth daily.     . Nutritional Supplements (ANTI-INFLAMMATORY ENZYME) CAPS APPLY 1 2 GMS TO AFFECTED AREA 3 4 TIMES DAILY AS NEEDED FOR PAIN  THIS IS A COMPOUNDED CREAM 1 PUMP EQUALS 1 GRAM  11  . simvastatin (ZOCOR) 40 MG tablet TAKE 1 TABLET (40 MG TOTAL) BY MOUTH EVERY EVENING. 90 tablet 2   No current facility-administered medications for this visit.    PHYSICAL EXAMINATION: ECOG PERFORMANCE STATUS: 1 - Symptomatic but completely ambulatory  Vitals:   10/21/19 1045  BP: (!) 159/73  Pulse: 89  Resp: 19  Temp: 98.9 F (37.2 C)  SpO2: 100%   Filed  Weights   10/21/19 1045  Weight: 102 lb 6.4 oz (46.4 kg)    BREAST: No palpable masses or nodules in either right or left breasts. No palpable axillary supraclavicular or infraclavicular adenopathy no breast tenderness or nipple discharge. (exam performed in the presence of a chaperone)  LABORATORY DATA:  I have reviewed the data as listed CMP Latest Ref Rng & Units 06/10/2019 07/21/2018 01/27/2018  Glucose 70 - 99 mg/dL 94 99 105(H)  BUN 6 - 23 mg/dL '13 21 13   ' Creatinine 0.40 - 1.20 mg/dL 0.87 0.83 0.86  Sodium 135 - 145 mEq/L 141 138 136  Potassium 3.5 - 5.1 mEq/L 4.4 4.1 3.9  Chloride 96 - 112 mEq/L 102 101 98  CO2 19 - 32 mEq/L '29 28 28  ' Calcium 8.4 - 10.5 mg/dL 10.4 10.3 9.4  Total Protein 6.0 - 8.3 g/dL 7.0 7.5 7.0  Total Bilirubin 0.2 - 1.2 mg/dL 0.4 0.4 0.3  Alkaline Phos 39 - 117 U/L 67 62 62  AST 0 - 37 U/L '18 19 18  ' ALT 0 - 35 U/L '17 18 15    ' Lab Results  Component Value Date   WBC 7.9 06/10/2019   HGB 11.8 (L) 06/10/2019   HCT 36.3 06/10/2019   MCV 90.9 06/10/2019   PLT 342.0 06/10/2019   NEUTROABS 4.6 06/10/2019    ASSESSMENT & PLAN:  Cancer of central portion of left female breast Left breast invasive ductal carcinoma clinical stage IIA initially triple negative with a Ki-67 of 47% underwent neoadjuvant chemotherapy followed by left breast lumpectomy, residual 0.4 cm tumor but it was ER positive PR negative HER-2 negative, completed adjuvant radiation therapy and is currently on Arimidex 1 mg daily since May 2015  Arimidex-related toxicities: 1.Osteoporosis: T score minus 2 on 08/19/2018: Currently on Prolia injections along with calcium and vitamin D 2. Intermittent hot flashes I recommended extended adjuvant therapy to go to 7 years.  Patient is willing to take it.  This will be concluded in May 2022 Monitoring closely for toxicities  Breast cancer surveillance: 1. Mammogram  12/16/2020benign, breast density category B 2. Breast exam2/5/2021benign she has noticed a improvement in the numbness under the arm. 3. Bone density12/01/2018: Osteoporosis T score -2 improved from -2.9  Neuropathyrelated to prior breast cancer therapy: Off neurontin   Patient's husband died from metastatic gastric cancer in November 2020 Return to clinic in one year for follow-up     No orders of the defined types were placed in this encounter.  The patient has a good understanding of the overall plan. she agrees with it.  she will call with any problems that may develop before the next visit here.  Total time spent: 20 mins including face to face time and time spent for planning, charting and coordination of care  Nicholas Lose, MD 10/21/2019  I, Cloyde Reams Dorshimer, am acting as scribe for Dr. Nicholas Lose.  I have reviewed the above documentation for accuracy and completeness, and I agree with the above.

## 2019-10-21 ENCOUNTER — Telehealth: Payer: Self-pay | Admitting: Hematology and Oncology

## 2019-10-21 ENCOUNTER — Other Ambulatory Visit: Payer: Self-pay

## 2019-10-21 ENCOUNTER — Inpatient Hospital Stay: Payer: Medicare HMO | Attending: Hematology and Oncology | Admitting: Hematology and Oncology

## 2019-10-21 DIAGNOSIS — Z79899 Other long term (current) drug therapy: Secondary | ICD-10-CM | POA: Insufficient documentation

## 2019-10-21 DIAGNOSIS — R232 Flushing: Secondary | ICD-10-CM | POA: Diagnosis not present

## 2019-10-21 DIAGNOSIS — M810 Age-related osteoporosis without current pathological fracture: Secondary | ICD-10-CM | POA: Diagnosis not present

## 2019-10-21 DIAGNOSIS — Z9221 Personal history of antineoplastic chemotherapy: Secondary | ICD-10-CM | POA: Diagnosis not present

## 2019-10-21 DIAGNOSIS — Z170 Estrogen receptor positive status [ER+]: Secondary | ICD-10-CM | POA: Diagnosis not present

## 2019-10-21 DIAGNOSIS — Z791 Long term (current) use of non-steroidal anti-inflammatories (NSAID): Secondary | ICD-10-CM | POA: Diagnosis not present

## 2019-10-21 DIAGNOSIS — Z79811 Long term (current) use of aromatase inhibitors: Secondary | ICD-10-CM | POA: Diagnosis not present

## 2019-10-21 DIAGNOSIS — G629 Polyneuropathy, unspecified: Secondary | ICD-10-CM | POA: Diagnosis not present

## 2019-10-21 DIAGNOSIS — Z923 Personal history of irradiation: Secondary | ICD-10-CM | POA: Diagnosis not present

## 2019-10-21 DIAGNOSIS — C50112 Malignant neoplasm of central portion of left female breast: Secondary | ICD-10-CM | POA: Diagnosis not present

## 2019-10-21 MED ORDER — ANASTROZOLE 1 MG PO TABS: ORAL_TABLET | ORAL | 4 refills | Status: DC

## 2019-10-21 NOTE — Telephone Encounter (Signed)
I talk with patient regarding schedule  

## 2019-10-21 NOTE — Assessment & Plan Note (Signed)
Left breast invasive ductal carcinoma clinical stage IIA initially triple negative with a Ki-67 of 47% underwent neoadjuvant chemotherapy followed by left breast lumpectomy, residual 0.4 cm tumor but it was ER positive PR negative HER-2 negative, completed adjuvant radiation therapy and is currently on Arimidex 1 mg daily since May 2015  Arimidex-related toxicities: 1.Osteoporosis: T score minus 2 on 08/19/2018: Currently on Prolia injections along with calcium and vitamin D 2. Intermittent hot flashes I recommended extended adjuvant therapy to go to 7 years.  Patient is willing to take it.  This will be concluded in May 2022 Monitoring closely for toxicities  Breast cancer surveillance: 1. Mammogram  12/16/2020benign, breast density category B 2. Breast exam2/5/2021benign 3. Bone density12/01/2018: Osteoporosis T score -2 improved from -2.9  Neuropathyrelated to prior breast cancer therapy: Off neurontin   Patient's husband was diagnosed with gastric cancer and is undergoing chemotherapy for metastatic gastric cancer Return to clinic in one year for follow-up

## 2019-10-25 DIAGNOSIS — H2513 Age-related nuclear cataract, bilateral: Secondary | ICD-10-CM | POA: Diagnosis not present

## 2019-10-25 DIAGNOSIS — H524 Presbyopia: Secondary | ICD-10-CM | POA: Diagnosis not present

## 2019-11-02 ENCOUNTER — Other Ambulatory Visit: Payer: Self-pay | Admitting: Family Medicine

## 2019-11-06 DIAGNOSIS — M62830 Muscle spasm of back: Secondary | ICD-10-CM | POA: Diagnosis not present

## 2019-11-06 DIAGNOSIS — M542 Cervicalgia: Secondary | ICD-10-CM | POA: Diagnosis not present

## 2019-11-06 DIAGNOSIS — M25511 Pain in right shoulder: Secondary | ICD-10-CM | POA: Diagnosis not present

## 2019-11-07 DIAGNOSIS — R69 Illness, unspecified: Secondary | ICD-10-CM | POA: Diagnosis not present

## 2019-12-12 DIAGNOSIS — R69 Illness, unspecified: Secondary | ICD-10-CM | POA: Diagnosis not present

## 2019-12-19 ENCOUNTER — Other Ambulatory Visit: Payer: Self-pay | Admitting: Family Medicine

## 2019-12-19 DIAGNOSIS — I10 Essential (primary) hypertension: Secondary | ICD-10-CM

## 2019-12-20 ENCOUNTER — Telehealth: Payer: Self-pay | Admitting: Family Medicine

## 2019-12-20 NOTE — Progress Notes (Signed)
  Chronic Care Management   Note  12/20/2019 Name: LAURYL RIEMERSMA MRN: [id number] DOB: 1946/02/01  Shanon Rosser is a 74 y.o. year old female who is a primary care patient of Birdie Riddle, Aundra Millet, MD. I reached out to Shanon Rosser by phone today in response to a referral sent by Ms. Dellia Beckwith Shelp's PCP, Midge Minium, MD.   Ms. Pettinato was given information about Chronic Care Management services today including:  1. CCM service includes personalized support from designated clinical staff supervised by her physician, including individualized plan of care and coordination with other care providers 2. 24/7 contact phone numbers for assistance for urgent and routine care needs. 3. Service will only be billed when office clinical staff spend 20 minutes or more in a month to coordinate care. 4. Only one practitioner may furnish and bill the service in a calendar month. 5. The patient may stop CCM services at any time (effective at the end of the month) by phone call to the office staff.   Patient agreed to services and verbal consent obtained.   Follow up plan:   Earney Hamburg [facility] Scheduler

## 2019-12-28 ENCOUNTER — Other Ambulatory Visit: Payer: Self-pay

## 2019-12-28 ENCOUNTER — Other Ambulatory Visit: Payer: Self-pay | Admitting: Family Medicine

## 2019-12-28 ENCOUNTER — Other Ambulatory Visit: Payer: Self-pay | Admitting: General Practice

## 2019-12-28 ENCOUNTER — Ambulatory Visit (INDEPENDENT_AMBULATORY_CARE_PROVIDER_SITE_OTHER): Payer: Medicare HMO | Admitting: Family Medicine

## 2019-12-28 ENCOUNTER — Encounter: Payer: Self-pay | Admitting: Family Medicine

## 2019-12-28 VITALS — BP 123/68 | HR 79 | Temp 97.9°F | Resp 16 | Ht <= 58 in | Wt 102.4 lb

## 2019-12-28 DIAGNOSIS — M810 Age-related osteoporosis without current pathological fracture: Secondary | ICD-10-CM

## 2019-12-28 DIAGNOSIS — E785 Hyperlipidemia, unspecified: Secondary | ICD-10-CM

## 2019-12-28 DIAGNOSIS — I10 Essential (primary) hypertension: Secondary | ICD-10-CM | POA: Diagnosis not present

## 2019-12-28 LAB — BASIC METABOLIC PANEL
BUN: 17 mg/dL (ref 6–23)
CO2: 30 mEq/L (ref 19–32)
Calcium: 9.8 mg/dL (ref 8.4–10.5)
Chloride: 102 mEq/L (ref 96–112)
Creatinine, Ser: 0.9 mg/dL (ref 0.40–1.20)
GFR: 61.48 mL/min (ref >60.00)
Glucose, Bld: 89 mg/dL (ref 70–99)
Potassium: 4.3 mEq/L (ref 3.5–5.1)
Sodium: 140 mEq/L (ref 135–145)

## 2019-12-28 LAB — LIPID PANEL
Cholesterol: 226 mg/dL — ABNORMAL HIGH (ref 0–200)
HDL: 67.8 mg/dL (ref >39.00)
LDL Cholesterol: 134 mg/dL — ABNORMAL HIGH (ref 0–99)
NonHDL: 158.11
Total CHOL/HDL Ratio: 3
Triglycerides: 119 mg/dL (ref 0.0–149.0)
VLDL: 23.8 mg/dL (ref 0.0–40.0)

## 2019-12-28 LAB — TSH: TSH: 2.2 u[IU]/mL (ref 0.35–4.50)

## 2019-12-28 LAB — CBC WITH DIFFERENTIAL/PLATELET
Basophils Absolute: 0.1 10*3/uL (ref 0.0–0.1)
Basophils Relative: 0.6 % (ref 0.0–3.0)
Eosinophils Absolute: 0.2 10*3/uL (ref 0.0–0.7)
Eosinophils Relative: 2.4 % (ref 0.0–5.0)
HCT: 32.8 % — ABNORMAL LOW (ref 36.0–46.0)
Hemoglobin: 11.1 g/dL — ABNORMAL LOW (ref 12.0–15.0)
Lymphocytes Relative: 37.1 % (ref 12.0–46.0)
Lymphs Abs: 3.2 10*3/uL (ref 0.7–4.0)
MCHC: 33.7 g/dL (ref 30.0–36.0)
MCV: 90 fl (ref 78.0–100.0)
Monocytes Absolute: 0.7 10*3/uL (ref 0.1–1.0)
Monocytes Relative: 8.6 % (ref 3.0–12.0)
Neutro Abs: 4.4 10*3/uL (ref 1.4–7.7)
Neutrophils Relative %: 51.3 % (ref 43.0–77.0)
Platelets: 336 10*3/uL (ref 150.0–400.0)
RBC: 3.65 Mil/uL — ABNORMAL LOW (ref 3.87–5.11)
RDW: 14.1 % (ref 11.5–15.5)
WBC: 8.7 10*3/uL (ref 4.0–10.5)

## 2019-12-28 LAB — HEPATIC FUNCTION PANEL
ALT: 16 U/L (ref 0–35)
AST: 18 U/L (ref 0–37)
Albumin: 4.5 g/dL (ref 3.5–5.2)
Alkaline Phosphatase: 60 U/L (ref 39–117)
Bilirubin, Direct: 0.1 mg/dL (ref 0.0–0.3)
Total Bilirubin: 0.4 mg/dL (ref 0.2–1.2)
Total Protein: 6.7 g/dL (ref 6.0–8.3)

## 2019-12-28 MED ORDER — DENOSUMAB 60 MG/ML ~~LOC~~ SOSY
60.0000 mg | PREFILLED_SYRINGE | Freq: Once | SUBCUTANEOUS | Status: AC
  Administered 2019-12-28: 60 mg via SUBCUTANEOUS

## 2019-12-28 NOTE — Assessment & Plan Note (Signed)
Chronic problem.  Well controlled.  Asymptomatic.  Check labs.  No anticipated med changes.  Will follow. 

## 2019-12-28 NOTE — Assessment & Plan Note (Signed)
Chronic problem.  Tolerating statin w/o difficulty.  Check labs.  Adjust meds prn  

## 2019-12-28 NOTE — Assessment & Plan Note (Signed)
Chronic problem.  Pt is having difficulty w/ her insurance and the cost of Prolia.  She got her injxn today but is going to investigate other options.  Encouraged her to discuss this w/ both her insurance company and the pharmacist at her upcoming visit.

## 2019-12-28 NOTE — Patient Instructions (Signed)
Schedule your complete physical in 6 months We'll notify you of your lab results and make any changes if needed Keep up the good work!  You look great!! Ask insurance for their list of preferred Osteoporosis drugs and their approximate costs Call with any questions or concerns Stay Safe!  Stay Healthy!

## 2019-12-28 NOTE — Progress Notes (Signed)
   Subjective:    Patient ID: Ellen Beltran, female    DOB: [birth year], 74 y.o.   MRN: [id number]  HPI HTN- chronic problem, on Irbesartan 75mg  daily w/ good control.  No CP, SOB, HAs, visual changes, edema.  Hyperlipidemia- chronic problem, on Simvastatin 40mg  daily.  No abd pain, N/V.  Is active in the yard but no formal exercise.  Osteoporosis- chronic problem, due for Prolia injxn today.  Pt has concerns bc this is increasing in cost and is more difficult to get   Review of Systems For ROS see HPI   This visit occurred during the SARS-CoV-2 public health emergency.  Safety protocols were in place, including screening questions prior to the visit, additional usage of staff PPE, and extensive cleaning of exam room while observing appropriate contact time as indicated for disinfecting solutions.       Objective:   Physical Exam Vitals reviewed.  Constitutional:      General: She is not in acute distress.    Appearance: Normal appearance. She is well-developed.  HENT:     Head: Normocephalic and atraumatic.  Eyes:     Conjunctiva/sclera: Conjunctivae normal.     Pupils: Pupils are equal, round, and reactive to light.  Neck:     Thyroid: No thyromegaly.  Cardiovascular:     Rate and Rhythm: Normal rate and regular rhythm.     Heart sounds: Normal heart sounds. No murmur.  Pulmonary:     Effort: Pulmonary effort is normal. No respiratory distress.     Breath sounds: Normal breath sounds.  Abdominal:     General: There is no distension.     Palpations: Abdomen is soft.     Tenderness: There is no abdominal tenderness.  Musculoskeletal:     Cervical back: Normal range of motion and neck supple.  Lymphadenopathy:     Cervical: No cervical adenopathy.  Skin:    General: Skin is warm and dry.  Neurological:     Mental Status: She is alert and oriented to person, place, and time.  Psychiatric:        Behavior: Behavior normal.           Assessment & Plan:

## 2019-12-30 NOTE — Patient Instructions (Addendum)
Visit Information  Goals Addressed            This Visit's Progress   . PharmD Care Plan       CARE PLAN ENTRY  Current Barriers:  . Chronic Disease Management support, education, and care coordination needs related to  high cholesterol, high blood pressure, and osteoporosis.   Pharmacist Clinical Goal(s):  Ellen Beltran Over next 6 months: Total Cholesterol <200, ensure affordability of osteoporosis medications, blood pressure < 140/90.  Interventions: . Comprehensive medication review performed. . Discussed concerns related to osteoporosis medication costs. . Discussed diet recommendations for lowering cholesterol.  Patient Self Care Activities:  . Patient verbalizes understanding of plan to call pharmacist with any questions. Over next six months, please try to focus on oats, barley, grains for breakfast foods.   Initial goal documentation.      Ellen Beltran was given information about Chronic Care Management services today including:  1. CCM service includes personalized support from designated clinical staff supervised by her physician, including individualized plan of care and coordination with other care providers 2. 24/7 contact phone numbers for assistance for urgent and routine care needs. 3. Standard insurance, coinsurance, copays and deductibles apply for chronic care management only during months in which we provide at least 20 minutes of these services. Most insurances cover these services at 100%, however patients may be responsible for any copay, coinsurance and/or deductible if applicable. This service may help you avoid the need for more expensive face-to-face services. 4. Only one practitioner may furnish and bill the service in a calendar month. 5. The patient may stop CCM services at any time (effective at the end of the month) by phone call to the office staff.  Patient agreed to services and verbal consent obtained.   The patient verbalized understanding of  instructions provided today and agreed to receive a mailed copy of patient instruction and/or educational materials. Telephone follow up appointment with pharmacy team member scheduled for: See next appointment with "Care Management Staff" under "What's Next" below.   Thank you!  Madelin Rear, Pharm.D. Clinical Pharmacist [facility] [phone] High Cholesterol  High cholesterol is a condition in which the blood has high levels of a white, waxy, fat-like substance (cholesterol). The human body needs small amounts of cholesterol. The liver makes all the cholesterol that the body needs. Extra (excess) cholesterol comes from the food that we eat. Cholesterol is carried from the liver by the blood through the blood vessels. If you have high cholesterol, deposits (plaques) may build up on the walls of your blood vessels (arteries). Plaques make the arteries narrower and stiffer. Cholesterol plaques increase your risk for heart attack and stroke. Work with your health care provider to keep your cholesterol levels in a healthy range. What increases the risk? This condition is more likely to develop in people who:  Eat foods that are high in animal fat (saturated fat) or cholesterol.  Are overweight.  Are not getting enough exercise.  Have a family history of high cholesterol. What are the signs or symptoms? There are no symptoms of this condition. How is this diagnosed? This condition may be diagnosed from the results of a blood test.  If you are older than age 19, your health care provider may check your cholesterol every 4-6 years.  You may be checked more often if you already have high cholesterol or other risk factors for heart disease. The blood test for cholesterol measures:  "Bad" cholesterol (  LDL cholesterol). This is the main type of cholesterol that causes heart disease. The desired level for LDL is less than 100.  "Good" cholesterol (HDL  cholesterol). This type helps to protect against heart disease by cleaning the arteries and carrying the LDL away. The desired level for HDL is 60 or higher.  Triglycerides. These are fats that the body can store or burn for energy. The desired number for triglycerides is lower than 150.  Total cholesterol. This is a measure of the total amount of cholesterol in your blood, including LDL cholesterol, HDL cholesterol, and triglycerides. A healthy number is less than 200. How is this treated? This condition is treated with diet changes, lifestyle changes, and medicines. Diet changes  This may include eating more whole grains, fruits, vegetables, nuts, and fish.  This may also include cutting back on red meat and foods that have a lot of added sugar. Lifestyle changes  Changes may include getting at least 40 minutes of aerobic exercise 3 times a week. Aerobic exercises include walking, biking, and swimming. Aerobic exercise along with a healthy diet can help you maintain a healthy weight.  Changes may also include quitting smoking. Medicines  Medicines are usually given if diet and lifestyle changes have failed to reduce your cholesterol to healthy levels.  Your health care provider may prescribe a statin medicine. Statin medicines have been shown to reduce cholesterol, which can reduce the risk of heart disease. Follow these instructions at home: Eating and drinking If told by your health care provider:  Eat chicken (without skin), fish, veal, shellfish, ground [place] breast, and round or loin cuts of red meat.  Do not eat fried foods or fatty meats, such as hot dogs and salami.  Eat plenty of fruits, such as apples.  Eat plenty of vegetables, such as broccoli, potatoes, and carrots.  Eat beans, peas, and lentils.  Eat grains such as barley, rice, couscous, and bulgur wheat.  Eat pasta without cream sauces.  Use skim or nonfat milk, and eat low-fat or nonfat yogurt and  cheeses.  Do not eat or drink whole milk, cream, ice cream, egg yolks, or hard cheeses.  Do not eat stick margarine or tub margarines that contain trans fats (also called partially hydrogenated oils).  Do not eat saturated tropical oils, such as coconut oil and palm oil.  Do not eat cakes, cookies, crackers, or other baked goods that contain trans fats.  General instructions  Exercise as directed by your health care provider. Increase your activity level with activities such as gardening, walking, and taking the stairs.  Take over-the-counter and prescription medicines only as told by your health care provider.  Do not use any products that contain nicotine or tobacco, such as cigarettes and e-cigarettes. If you need help quitting, ask your health care provider.  Keep all follow-up visits as told by your health care provider. This is important. Contact a health care provider if:  You are struggling to maintain a healthy diet or weight.  You need help to start on an exercise program.  You need help to stop smoking. Get help right away if:  You have chest pain.  You have trouble breathing. This information is not intended to replace advice given to you by your health care provider. Make sure you discuss any questions you have with your health care provider. Document Revised: 09/04/2017 Document Reviewed: 03/01/2016 Elsevier Patient Education  [place].

## 2019-12-30 NOTE — Progress Notes (Signed)
Chronic Care Management Pharmacy  Name: Ellen Beltran  MRN: [id number] DOB: [birth year]  Chief Complaint/ HPI  Shanon Rosser,  74 y.o. , female presents for their Initial CCM visit with the clinical pharmacist via telephone due to COVID-19 Pandemic.  PCP : Midge Minium, MD  Their chronic conditions include: HTN, HLD, OP.  Office Visits:  4/14 (PCP): Prolia injection, expressed concern with cost   Medications: Outpatient Encounter Medications as of 01/02/2020  Medication Sig  . anastrozole (ARIMIDEX) 1 MG tablet TAKE 1 TABLET (1 MG TOTAL) BY MOUTH DAILY.  . Calcium Citrate-Vitamin D (CALCIUM CITRATE + D3 PO) Take by mouth daily. 400-12.5 - 3 tabs daily  . cetirizine (ZYRTEC) 10 MG tablet Take 1 tablet (10 mg total) by mouth daily as needed for allergies.  . Cholecalciferol (VITAMIN D PO) Take 5,000 mg by mouth daily.  Marland Kitchen denosumab (PROLIA) 60 MG/ML SOSY injection Inject 60 mg into the skin every 6 (six) months.  . fluticasone (FLONASE) 50 MCG/ACT nasal spray PLACE 1 SPRAY INTO BOTH NOSTRILS DAILY AS NEEDED. CONGESTION  . irbesartan (AVAPRO) 75 MG tablet Take 1 tablet by mouth once daily  . Multiple Vitamins-Minerals (ALIVE WOMENS 50+ PO) Take 1 each by mouth daily.   . simvastatin (ZOCOR) 40 MG tablet TAKE 1 TABLET (40 MG TOTAL) BY MOUTH EVERY EVENING.   No facility-administered encounter medications on file as of 01/02/2020.   Current Diagnosis/Assessment:  Goals Addressed            This Visit's Progress   . PharmD Care Plan       CARE PLAN ENTRY  Current Barriers:  . Chronic Disease Management support, education, and care coordination needs related to  high cholesterol, high blood pressure, and osteoporosis.   Pharmacist Clinical Goal(s):  Marland Kitchen Over next 6 months: Total Cholesterol <200, ensure affordability of osteoporosis medications, blood pressure < 140/90.  Interventions: . Comprehensive medication review performed. . Discussed concerns related to  osteoporosis medication costs. . Discussed diet recommendations for lowering cholesterol.  Patient Self Care Activities:  . Patient verbalizes understanding of plan to call pharmacist with any questions. Over next six months, please try to focus on oats, barley, grains for breakfast foods.   Initial goal documentation.      Hypertension   BP today is:  <140/90  Office blood pressures are  BP Readings from Last 3 Encounters:  12/28/19 123/68  10/21/19 (!) 159/73  06/10/19 132/80   Patient is currently controlled on the following medications: irbesartan 75 mg daily. Patient checks BP at home several times per month.   We discussed current medication regimend and signs of low and high blood pressure.  Denies dizziness, SOB, chest pain.  Plan Continue current medications    Hyperlipidemia   Lipid Panel     Component Value Date/Time   CHOL 226 (H) 12/28/2019 1352   TRIG 119.0 12/28/2019 1352   HDL 67.80 12/28/2019 1352   CHOLHDL 3 12/28/2019 1352   VLDL 23.8 12/28/2019 1352   LDLCALC 134 (H) 12/28/2019 1352    The 10-year ASCVD risk score Mikey Bussing DC Jr., et al., 2013) is: 14.5%   Values used to calculate the score:     Age: 85 years     Sex: Female     Is Non-Hispanic African American: No     Diabetic: No     Tobacco smoker: No     Systolic Blood Pressure: [id number] mmHg     Is BP treated: Yes  HDL Cholesterol: 67.8 mg/dL     Total Cholesterol: 226 mg/dL   Has been on current simvastatin 40mg  daily past several years. No other statin on record. Denies any muscle or abdominal pain or n/v.  Total cholesterol and LDL elevated at last office visit. Discussed dietary recommendations at length with patient and daughter. Example of foods eaten throughout day include: fruit, brocoli, cereal, breakfast bar. Advised focus on oats, barley, grains for breakfast foods.   Plan Continue current medications and start on diet recommendations.    Osteoporosis  AP Spine L1-L2  08/19/2018 71.0 -2.0 0.927 g/cm2 AP Spine L1-L2 07/30/2016 69.0 -2.9 0.827 g/cm2  DualFemur Total Right 08/19/2018 71.0 -1.9 0.769 g/cm2 DualFemur Total Right 07/30/2016 69.0 -2.5 0.691 g/cm2  DualFemur Total Mean 08/19/2018 71.0 -1.7 0.791 g/cm2 DualFemur Total Mean 07/30/2016 69.0 -2.3 0.717 g/cm2  Patient is currently controlled on the following medications: Prolia 60 mg every 6 months. Bone density has significantly improved since starting on Prolia. Concern has been expressed on medication cost. Review of alternatives - other non-bisphosphonates such as Forteo or Tymlos are Tier 5, where Prolia is currently approved for tier 3.   Discussing with patient and daughter today, concern is focused on the process of getting the Prolia approved each time and the climbing cost of the medication each year. For the time being wishes to stay with Prolia but understands to follow-up with office or pharmacist should this change. Patient was provided information on [employer]'s patient assistance program - "[employer]" as well as information on tier one medications with no copay, such as alendronate.   Plan Continue current medications.  Vaccines  Reviewed and discussed patient's vaccination history. Patient up to date on routine vaccines. Completed current recommendation for [place] vaccine series on 2/23, 3/16.   Immunization History  Administered Date(s) Administered  . Fluad Quad(high Dose 65+) 06/10/2019  . Influenza Split 06/09/2014  . Influenza, High Dose Seasonal PF 06/17/2018  . Influenza,inj,Quad PF,6+ Mos 07/29/2013  . Influenza-Unspecified 06/04/2015, 05/23/2016, 06/16/2017  . Pneumococcal Conjugate-13 02/27/2014  . Pneumococcal Polysaccharide-23 02/06/2015  . Tdap 01/02/2016  . Zoster 03/17/2014  . Zoster Recombinat (Shingrix) 05/15/2017, 08/11/2017   Plan No recommendations at this time. _______________ Visit Information  SDOH (Social Determinants of Health)  assessments performed: Yes.    Food Insecurity: No Food Insecurity  . Worried About Charity fundraiser in the Last Year: Never true  . Ran Out of Food in the Last Year: Never true     Transportation Needs: No Transportation Needs  . Lack of Transportation (Medical): No  . Lack of Transportation (Non-Medical): No   Ms. Lenzy was given information about Chronic Care Management services today including:  1. CCM service includes personalized support from designated clinical staff supervised by her physician, including individualized plan of care and coordination with other care providers 2. 24/7 contact phone numbers for assistance for urgent and routine care needs. 3. Standard insurance, coinsurance, copays and deductibles apply for chronic care management only during months in which we provide at least 20 minutes of these services. Most insurances cover these services at 100%, however patients may be responsible for any copay, coinsurance and/or deductible if applicable. This service may help you avoid the need for more expensive face-to-face services. 4. Only one practitioner may furnish and bill the service in a calendar month. 5. The patient may stop CCM services at any time (effective at the end of the month) by phone call to the office  staff.  Patient agreed to services and verbal consent obtained.   Madelin Rear, Pharm.D. Clinical Pharmacist [facility] [phone]

## 2020-01-02 ENCOUNTER — Ambulatory Visit: Payer: Medicare HMO

## 2020-01-02 DIAGNOSIS — M810 Age-related osteoporosis without current pathological fracture: Secondary | ICD-10-CM

## 2020-01-02 DIAGNOSIS — I10 Essential (primary) hypertension: Secondary | ICD-10-CM

## 2020-01-02 DIAGNOSIS — E785 Hyperlipidemia, unspecified: Secondary | ICD-10-CM

## 2020-01-25 ENCOUNTER — Other Ambulatory Visit: Payer: Self-pay | Admitting: Family Medicine

## 2020-02-22 ENCOUNTER — Encounter: Payer: Self-pay | Admitting: Family Medicine

## 2020-02-22 ENCOUNTER — Ambulatory Visit (INDEPENDENT_AMBULATORY_CARE_PROVIDER_SITE_OTHER)
Admission: RE | Admit: 2020-02-22 | Discharge: 2020-02-22 | Disposition: A | Discharge status: Home / Self Care | Payer: Medicare HMO | Source: Ambulatory Visit | Attending: Family Medicine | Admitting: Family Medicine

## 2020-02-22 ENCOUNTER — Ambulatory Visit (INDEPENDENT_AMBULATORY_CARE_PROVIDER_SITE_OTHER): Payer: Medicare HMO | Admitting: Family Medicine

## 2020-02-22 ENCOUNTER — Other Ambulatory Visit: Payer: Self-pay

## 2020-02-22 VITALS — BP 128/81 | HR 76 | Temp 98.0°F | Resp 16 | Ht <= 58 in | Wt 103.5 lb

## 2020-02-22 DIAGNOSIS — M5416 Radiculopathy, lumbar region: Secondary | ICD-10-CM

## 2020-02-22 DIAGNOSIS — M545 Low back pain: Secondary | ICD-10-CM | POA: Diagnosis not present

## 2020-02-22 NOTE — Progress Notes (Signed)
   Subjective:    Patient ID: Ellen Beltran, female    DOB: 1946/05/25, 74 y.o.   MRN: [id number]  HPI LBP- intermittent since Feb.  Pain is always 'really low' and at times will radiate into L thigh.  No weakness or numbness of legs bilaterally.  No bowel or bladder incontinence.  Pain improves w/ movement, ibuprofen.  Pain is worse w/ sitting.  Hard to get comfortable in bed.  Pain 'eases' w/ leaning forward.  In Feb went to UC and was given 'cortisone shot in the hip and a muscle relaxer'.  Tizanidine caused dizziness and she stopped taking due to fear of falls.  Pt was told she has 'arthritis in the spine'  Pain has mostly 'subsided' at this time but she is fearful it will start again.   Review of Systems For ROS see HPI   This visit occurred during the SARS-CoV-2 public health emergency.  Safety protocols were in place, including screening questions prior to the visit, additional usage of staff PPE, and extensive cleaning of exam room while observing appropriate contact time as indicated for disinfecting solutions.      Objective:   Physical Exam Vitals reviewed.  Constitutional:      General: She is not in acute distress.    Appearance: Normal appearance. She is not ill-appearing or toxic-appearing.  HENT:     Head: Normocephalic and atraumatic.  Cardiovascular:     Pulses: Normal pulses.  Musculoskeletal:     Comments: No TTP over lumbar spine, SI joints, or glutes bilaterally  Skin:    General: Skin is warm and dry.  Neurological:     General: No focal deficit present.     Mental Status: She is alert and oriented to person, place, and time.     Sensory: No sensory deficit.     Motor: No weakness.     Coordination: Coordination normal.     Gait: Gait normal.     Deep Tendon Reflexes: Reflexes normal.     Comments: (-) SLR bilaterally  Psychiatric:        Mood and Affect: Mood normal.        Behavior: Behavior normal.        Thought Content: Thought content normal.            Assessment & Plan:  Lumbar back pain w/ radiculopathy- new.  Pt reports sxs started in Feb.  Have been off and on.  Sxs improve w/ activity but worsen w/ prolonged sitting.  (-) SLR.  No TTP on exam today.  Will get xray to assess LS spine for possible DDD or arthritic change.  She is to continue ibuprofen prn.  Once xrays available will determine if PT or Sports med would be appropriate.  Pt expressed understanding and is in agreement w/ plan.

## 2020-02-22 NOTE — Patient Instructions (Signed)
Go to [place] to get your xrays done [facility] notify you of those results when available Continue the ibuprofen as needed for pain Use a heating pad when there is pain or discomfort Once we have the xrays, we can determine if next steps are needed Call with any questions or concerns Hang in there!!!

## 2020-02-23 ENCOUNTER — Other Ambulatory Visit: Payer: Self-pay | Admitting: Family Medicine

## 2020-02-23 DIAGNOSIS — M5137 Other intervertebral disc degeneration, lumbosacral region: Secondary | ICD-10-CM

## 2020-03-04 ENCOUNTER — Other Ambulatory Visit: Payer: Self-pay | Admitting: Family Medicine

## 2020-03-04 DIAGNOSIS — I10 Essential (primary) hypertension: Secondary | ICD-10-CM

## 2020-03-13 DIAGNOSIS — M545 Low back pain: Secondary | ICD-10-CM | POA: Diagnosis not present

## 2020-04-26 DIAGNOSIS — Z853 Personal history of malignant neoplasm of breast: Secondary | ICD-10-CM | POA: Diagnosis not present

## 2020-05-07 DIAGNOSIS — R69 Illness, unspecified: Secondary | ICD-10-CM | POA: Diagnosis not present

## 2020-05-23 ENCOUNTER — Telehealth: Payer: Self-pay | Admitting: Family Medicine

## 2020-05-23 NOTE — Telephone Encounter (Signed)
Attempted to schedule AWV. Unable to LVM.  Will try at later time.  

## 2020-05-29 ENCOUNTER — Other Ambulatory Visit: Payer: Self-pay | Admitting: Family Medicine

## 2020-06-08 NOTE — Progress Notes (Signed)
Subjective:   Ellen Beltran is a 74 y.o. female who presents for Medicare Annual (Subsequent) preventive examination.  I connected with Marshae today by telephone and verified that I am speaking with the correct person using two identifiers. Location patient: home Location provider: work Persons participating in the virtual visit: patient, Marine scientist.    I discussed the limitations, risks, security and privacy concerns of performing an evaluation and management service by telephone and the availability of in person appointments. I also discussed with the patient that there may be a patient responsible charge related to this service. The patient expressed understanding and verbally consented to this telephonic visit.    Interactive audio and video telecommunications were attempted between this provider and patient, however failed, due to patient having technical difficulties OR patient did not have access to video capability.  We continued and completed visit with audio only.  Some vital signs may be absent or patient reported.   Time Spent with patient on telephone encounter: 20 minutes  Review of Systems     Cardiac Risk Factors include: advanced age (>48men, >16 women);dyslipidemia;hypertension     Objective:    Today's Vitals   06/11/20 1119  Weight: 103 lb (46.7 kg)  Height: 4\' 8"  (1.422 m)   Body mass index is 23.09 kg/m.  Advanced Directives 06/11/2020 01/27/2018 01/21/2017 10/22/2016 05/21/2016 03/23/2015 09/22/2014  Does Patient Have a Medical Advance Directive? No No No No No No No  Would patient like information on creating a medical advance directive? Yes (MAU/Ambulatory/Procedural Areas - Information given) No - Patient declined No - Patient declined - - - -  Pre-existing out of facility DNR order (yellow form or pink MOST form) - - - - - - -    Current Medications (verified) Outpatient Encounter Medications as of 06/11/2020  Medication Sig  . anastrozole (ARIMIDEX) 1 MG tablet  TAKE 1 TABLET (1 MG TOTAL) BY MOUTH DAILY.  . Calcium Citrate-Vitamin D (CALCIUM CITRATE + D3 PO) Take by mouth daily. 400-12.5 - 3 tabs daily  . cetirizine (ZYRTEC) 10 MG tablet Take 1 tablet (10 mg total) by mouth daily as needed for allergies.  . Cholecalciferol (VITAMIN D PO) Take 5,000 mg by mouth daily.  . fluticasone (FLONASE) 50 MCG/ACT nasal spray PLACE 1 SPRAY INTO BOTH NOSTRILS DAILY AS NEEDED. CONGESTION  . irbesartan (AVAPRO) 75 MG tablet Take 1 tablet by mouth once daily  . Multiple Vitamins-Minerals (ALIVE WOMENS 50+ PO) Take 1 each by mouth daily.   Marland Kitchen PROLIA 60 MG/ML SOSY injection TO BE ADMINISTERED IN PHYSICIAN'S OFFICE. INJECT ONE SYRINGE UNDE SKIN ONCE EVERY 6 MONTHS. REFRIGERATE. USE WITHIN 14 DAYS ONCE AT ROOM TEMPERATURE.  . simvastatin (ZOCOR) 40 MG tablet TAKE 1 TABLET (40 MG TOTAL) BY MOUTH EVERY EVENING.   No facility-administered encounter medications on file as of 06/11/2020.    Allergies (verified) Codeine, Morphine and related, and Sulfa antibiotics   History: Past Medical History:  Diagnosis Date  . Arthritis   . Cancer [facility])    left breast  . Dental bridge present    left lower  . H/O mumps   . History of blood transfusion 09/24/2013  . History of chemotherapy    stopped 08/2013  . History of chicken pox   . Hyperlipidemia   . Neuropathy due to chemotherapeutic drug ([place])    hands and feet  . Osteopenia   . Personal history of chemotherapy    2015  . Personal history of radiation therapy  2015   Past Surgical History:  Procedure Laterality Date  . BREAST LUMPECTOMY Left 10/17/2013  . BREAST LUMPECTOMY WITH NEEDLE LOCALIZATION AND AXILLARY SENTINEL LYMPH NODE BX Left 10/17/2013   Procedure: BREAST LUMPECTOMY WITH NEEDLE LOCALIZATION AND AXILLARY SENTINEL LYMPH NODE BX;  Surgeon: Shann Medal, MD;  Location: [place];  Service: General;  Laterality: Left;  . CARDIAC CATHETERIZATION  08/31/2003   no disease  . COLONOSCOPY  WITH PROPOFOL  06/22/2012  . PELVIC FLOOR REPAIR  05/06/2001   cardinal uterosacral colposuspension  . PORTACATH PLACEMENT N/A 05/02/2013   Procedure: INSERTION PORT-A-CATH;  Surgeon: Shann Medal, MD;  Location: [facility];  Service: General;  Laterality: N/A;  . TOTAL ABDOMINAL HYSTERECTOMY W/ BILATERAL SALPINGOOPHORECTOMY  05/06/2001  . TUBAL LIGATION  age 86   Family History  Problem Relation Age of Onset  . Heart disease Father   . Brain cancer Father   . Heart disease Brother   . Diabetes Brother   . Lung cancer Mother   . Thyroid disease Sister   . Thyroid disease Sister   . Thyroid disease Sister   . Cancer Maternal Uncle        pancreatic  . Cancer Maternal Uncle        colon   Social History   Socioeconomic History  . Marital status: Widowed    Spouse name: Not on file  . Number of children: Not on file  . Years of education: Not on file  . Highest education level: Not on file  Occupational History  . Occupation: Retired  Tobacco Use  . Smoking status: Never Smoker  . Smokeless tobacco: Never Used  Vaping Use  . Vaping Use: Never used  Substance and Sexual Activity  . Alcohol use: Yes  . Drug use: No  . Sexual activity: Yes  Other Topics Concern  . Not on file  Social History Narrative  . Not on file   Social Determinants of Health   Financial Resource Strain: Low Risk   . Difficulty of Paying Living Expenses: Not hard at all  Food Insecurity: No Food Insecurity  . Worried About Charity fundraiser in the Last Year: Never true  . Ran Out of Food in the Last Year: Never true  Transportation Needs: No Transportation Needs  . Lack of Transportation (Medical): No  . Lack of Transportation (Non-Medical): No  Physical Activity: Sufficiently Active  . Days of Exercise per Week: 7 days  . Minutes of Exercise per Session: 40 min  Stress: No Stress Concern Present  . Feeling of Stress : Not at all  Social Connections: Moderately Integrated  . Frequency of  Communication with Friends and Family: More than three times a week  . Frequency of Social Gatherings with Friends and Family: Once a week  . Attends Religious Services: 1 to 4 times per year  . Active Member of Clubs or Organizations: Yes  . Attends Archivist Meetings: 1 to 4 times per year  . Marital Status: Widowed    Tobacco Counseling Counseling given: Not Answered   Clinical Intake:  Pre-visit preparation completed: Yes  Pain : No/denies pain     Nutritional Status: BMI of 19-24  Normal Nutritional Risks: None Diabetes: No  How often do you need to have someone help you when you read instructions, pamphlets, or other written materials from your doctor or pharmacy?: 1 - Never What is the last grade level you completed in school?: 12th grade  Diabetic?No  Interpreter Needed?: No  Information entered by :: Caroleen Hamman LPN   Activities of Daily Living In your present state of health, do you have any difficulty performing the following activities: 06/11/2020 02/22/2020  Hearing? N N  Vision? N N  Difficulty concentrating or making decisions? N N  Walking or climbing stairs? N N  Dressing or bathing? N N  Doing errands, shopping? N N  Preparing Food and eating ? N -  Using the Toilet? N -  In the past six months, have you accidently leaked urine? N -  Do you have problems with loss of bowel control? N -  Managing your Medications? N -  Managing your Finances? N -  Housekeeping or managing your Housekeeping? N -  Some recent data might be hidden    Patient Care Team: Midge Minium, MD as PCP - General (Family Medicine) Thea Silversmith, MD as Consulting Physician (Radiation Oncology) Nicholas Lose, MD as Consulting Physician (Hematology and Oncology) Alphonsa Overall, MD as Consulting Physician (General Surgery) Madelin Rear, [facility] as Pharmacist (Pharmacist)  Indicate any recent Medical Services you may have received from other than [facility] providers  in the past year (date may be approximate).     Assessment:   This is a routine wellness examination for Ellen Beltran.  Hearing/Vision screen  Hearing Screening   125Hz  250Hz  500Hz  1000Hz  2000Hz  3000Hz  4000Hz  6000Hz  8000Hz   Right ear:           Left ear:           Comments: No issues  Vision Screening Comments: Reading glasses Last eye exam-09/2019-Dr. Boeing  Dietary issues and exercise activities discussed: Current Exercise Habits: Home exercise routine, Type of exercise: walking, Time (Minutes): 40, Frequency (Times/Week): 7, Weekly Exercise (Minutes/Week): 280, Intensity: Mild, Exercise limited by: None identified  Goals      Patient Stated   .  maintain (pt-stated)      Maintain current health by continuing to eat healthy and being active.       Other   .  PharmD Care Plan      CARE PLAN ENTRY  Current Barriers:  . Chronic Disease Management support, education, and care coordination needs related to  high cholesterol, high blood pressure, and osteoporosis.   Pharmacist Clinical Goal(s):  Marland Kitchen Over next 6 months: Total Cholesterol <200, ensure affordability of osteoporosis medications, blood pressure < 140/90.  Interventions: . Comprehensive medication review performed. . Discussed concerns related to osteoporosis medication costs. . Discussed diet recommendations for lowering cholesterol.  Patient Self Care Activities:  . Patient verbalizes understanding of plan to call pharmacist with any questions. Over next six months, please try to focus on oats, barley, grains for breakfast foods.   Initial goal documentation.      Depression Screen PHQ 2/9 Scores 06/11/2020 02/22/2020 12/28/2019 06/10/2019 04/01/2019 07/21/2018 01/27/2018  PHQ - 2 Score 0 0 0 0 0 0 0  PHQ- 9 Score - - 0 0 0 0 -    Fall Risk Fall Risk  06/11/2020 02/22/2020 12/28/2019 06/10/2019 04/01/2019  Falls in the past year? 0 0 0 1 0  Number falls in past yr: 0 0 0 0 0  Injury with Fall? 0 0 0 1 0  Follow up Falls  prevention discussed Falls evaluation completed Falls evaluation completed - -    Any stairs in or around the home? No  Home free of loose throw rugs in walkways, pet beds, electrical cords, etc? Yes  Adequate lighting  in your home to reduce risk of falls? Yes   ASSISTIVE DEVICES UTILIZED TO PREVENT FALLS:  Life alert? No  Use of a cane, walker or w/c? No  Grab bars in the bathroom? No  Shower chair or bench in shower? Yes  Elevated toilet seat or a handicapped toilet? No   TIMED UP AND GO:  Was the test performed? No . Phone visit   Cognitive Function:No cognitive impairment noted. MMSE - Mini Mental State Exam 01/27/2018  Orientation to time 5  Orientation to Place 5  Registration 3  Attention/ Calculation 5  Recall 3  Language- name 2 objects 2  Language- repeat 1  Language- follow 3 step command 3  Language- read & follow direction 1  Write a sentence 1  Copy design 1  Total score 30        Immunizations Immunization History  Administered Date(s) Administered  . Fluad Quad(high Dose 65+) 06/10/2019  . Influenza Split 06/09/2014  . Influenza, High Dose Seasonal PF 06/17/2018  . Influenza,inj,Quad PF,6+ Mos 07/29/2013  . Influenza-Unspecified 06/04/2015, 05/23/2016, 06/16/2017  . PFIZER SARS-COV-2 Vaccination 11/07/2018, 11/29/2019  . Pneumococcal Conjugate-13 02/27/2014  . Pneumococcal Polysaccharide-23 02/06/2015  . Tdap 01/02/2016  . Zoster 03/17/2014  . Zoster Recombinat (Shingrix) 05/15/2017, 08/11/2017    TDAP status: Up to date   Flu Vaccine status: Declined, Education has been provided regarding the importance of this vaccine but patient still declined. Advised may receive this vaccine at local pharmacy or Health Dept. Aware to provide a copy of the vaccination record if obtained from local pharmacy or Health Dept. Verbalized acceptance and understanding. Phone visit  Pneumococcal vaccine status: Up to date   Covid-19 vaccine status: Completed  vaccines    Qualifies for Shingles Vaccine? No   Zostavax completed Yes   Shingrix Completed?: Yes  Screening Tests Health Maintenance  Topic Date Due  . INFLUENZA VACCINE  04/15/2020  . MAMMOGRAM  08/21/2020  . COLONOSCOPY  06/22/2022  . TETANUS/TDAP  01/01/2026  . DEXA SCAN  Completed  . COVID-19 Vaccine  Completed  . Hepatitis C Screening  Completed  . PNA vac Low Risk Adult  Completed    Health Maintenance  Health Maintenance Due  Topic Date Due  . INFLUENZA VACCINE  04/15/2020    Colorectal cancer screening: Completed 06/22/2012. Repeat every 10 years   Mammogram status: Completed 08/22/2019. Repeat every year   Bone Density status: Completed 08/19/2018. Results reflect: Bone density results: OSTEOPENIA. Repeat every 2 years.   Lung Cancer Screening: (Low Dose CT Chest recommended if Age 71-80 years, 30 pack-year currently smoking OR have quit w/in 15years.) does not qualify.    Additional Screening:  Hepatitis C Screening:  Completed 09/23/2013  Vision Screening: Recommended annual ophthalmology exams for early detection of glaucoma and other disorders of the eye. Is the patient up to date with their annual eye exam?  Yes  Who is the provider or what is the name of the office in which the patient attends annual eye exams? Dr. Roselind Messier  Dental Screening: Recommended annual dental exams for proper oral hygiene  Community Resource Referral / Chronic Care Management: CRR required this visit?  No   CCM required this visit?  No      Plan:     I have personally reviewed and noted the following in the patient's chart:   . Medical and social history . Use of alcohol, tobacco or illicit drugs  . Current medications and supplements . Functional ability and  status . Nutritional status . Physical activity . Advanced directives . List of other physicians . Hospitalizations, surgeries, and ER visits in previous 12 months . Vitals . Screenings to include cognitive,  depression, and falls . Referrals and appointments  In addition, I have reviewed and discussed with patient certain preventive protocols, quality metrics, and best practice recommendations. A written personalized care plan for preventive services as well as general preventive health recommendations were provided to patient.    Due to this being a telephonic visit, the after visit summary with patients personalized plan was offered to patient via mail or my-chart.  Patient would like to access on my-chart.  Marta Antu, LPN   [id number]  Nurse Health Advisor  Nurse Notes: None

## 2020-06-11 ENCOUNTER — Ambulatory Visit: Payer: Medicare HMO

## 2020-06-11 ENCOUNTER — Ambulatory Visit (INDEPENDENT_AMBULATORY_CARE_PROVIDER_SITE_OTHER): Payer: Medicare HMO

## 2020-06-11 VITALS — Ht <= 58 in | Wt 103.0 lb

## 2020-06-11 DIAGNOSIS — Z0000 Encounter for general adult medical examination without abnormal findings: Secondary | ICD-10-CM | POA: Diagnosis not present

## 2020-06-11 NOTE — Patient Instructions (Signed)
Ellen Beltran , Thank you for taking time to complete your Medicare Wellness Visit. I appreciate your ongoing commitment to your health goals. Please review the following plan we discussed and let me know if I can assist you in the future.   Screening recommendations/referrals: Colonoscopy: Completed 06/22/2012-Due 06/22/2022 Mammogram: Completed 08/22/2019-Due-08/21/2020 Bone Density: Completed 08/19/2018-Due 08/19/2020 Recommended yearly ophthalmology/optometry visit for glaucoma screening and checkup Recommended yearly dental visit for hygiene and checkup  Vaccinations: Influenza vaccine: Due-May obtain vaccine at our office or your local pharmacy Pneumococcal vaccine: Completed vaccines Tdap vaccine: Up to Date- Due-01/01/2026 Shingles vaccine: Completed vaccines  Covid-19:Completed vaccines  Advanced directives: Discussed. Please bring a copy for your chart once completed.  Conditions/risks identified: See problem list  Next appointment: Follow up in one year for your annual wellness visit 06/17/2021 @ 11:15am.   Preventive Care 65 Years and Older, Female Preventive care refers to lifestyle choices and visits with your health care provider that can promote health and wellness. What does preventive care include?  A yearly physical exam. This is also called an annual well check.  Dental exams once or twice a year.  Routine eye exams. Ask your health care provider how often you should have your eyes checked.  Personal lifestyle choices, including:  Daily care of your teeth and gums.  Regular physical activity.  Eating a healthy diet.  Avoiding tobacco and drug use.  Limiting alcohol use.  Practicing safe sex.  Taking low-dose aspirin every day.  Taking vitamin and mineral supplements as recommended by your health care provider. What happens during an annual well check? The services and screenings done by your health care provider during your annual well check will depend  on your age, overall health, lifestyle risk factors, and family history of disease. Counseling  Your health care provider may ask you questions about your:  Alcohol use.  Tobacco use.  Drug use.  Emotional well-being.  Home and relationship well-being.  Sexual activity.  Eating habits.  History of falls.  Memory and ability to understand (cognition).  Work and work Statistician.  Reproductive health. Screening  You may have the following tests or measurements:  Height, weight, and BMI.  Blood pressure.  Lipid and cholesterol levels. These may be checked every 5 years, or more frequently if you are over 73 years old.  Skin check.  Lung cancer screening. You may have this screening every year starting at age 73 if you have a 30-pack-year history of smoking and currently smoke or have quit within the past 15 years.  Fecal occult blood test (FOBT) of the stool. You may have this test every year starting at age 73.  Flexible sigmoidoscopy or colonoscopy. You may have a sigmoidoscopy every 5 years or a colonoscopy every 10 years starting at age 44.  Hepatitis C blood test.  Hepatitis B blood test.  Sexually transmitted disease (STD) testing.  Diabetes screening. This is done by checking your blood sugar (glucose) after you have not eaten for a while (fasting). You may have this done every 1-3 years.  Bone density scan. This is done to screen for osteoporosis. You may have this done starting at age 73.  Mammogram. This may be done every 1-2 years. Talk to your health care provider about how often you should have regular mammograms. Talk with your health care provider about your test results, treatment options, and if necessary, the need for more tests. Vaccines  Your health care provider may recommend certain vaccines, such as:  Influenza vaccine. This is recommended every year.  Tetanus, diphtheria, and acellular pertussis (Tdap, Td) vaccine. You may need a Td  booster every 10 years.  Zoster vaccine. You may need this after age 73.  Pneumococcal 13-valent conjugate (PCV13) vaccine. One dose is recommended after age 73.  Pneumococcal polysaccharide (PPSV23) vaccine. One dose is recommended after age 73. Talk to your health care provider about which screenings and vaccines you need and how often you need them. This information is not intended to replace advice given to you by your health care provider. Make sure you discuss any questions you have with your health care provider. Document Released: 09/28/2015 Document Revised: 05/21/2016 Document Reviewed: 07/03/2015 Elsevier Interactive Patient Education  2017 [place] Prevention in the Home Falls can cause injuries. They can happen to people of all ages. There are many things you can do to make your home safe and to help prevent falls. What can I do on the outside of my home?  Regularly fix the edges of walkways and driveways and fix any cracks.  Remove anything that might make you trip as you walk through a door, such as a raised step or threshold.  Trim any bushes or trees on the path to your home.  Use bright outdoor lighting.  Clear any walking paths of anything that might make someone trip, such as rocks or tools.  Regularly check to see if handrails are loose or broken. Make sure that both sides of any steps have handrails.  Any raised decks and porches should have guardrails on the edges.  Have any leaves, snow, or ice cleared regularly.  Use sand or salt on walking paths during winter.  Clean up any spills in your garage right away. This includes oil or grease spills. What can I do in the bathroom?  Use night lights.  Install grab bars by the toilet and in the tub and shower. Do not use towel bars as grab bars.  Use non-skid mats or decals in the tub or shower.  If you need to sit down in the shower, use a plastic, non-slip stool.  Keep the floor dry. Clean up  any water that spills on the floor as soon as it happens.  Remove soap buildup in the tub or shower regularly.  Attach bath mats securely with double-sided non-slip rug tape.  Do not have throw rugs and other things on the floor that can make you trip. What can I do in the bedroom?  Use night lights.  Make sure that you have a light by your bed that is easy to reach.  Do not use any sheets or blankets that are too big for your bed. They should not hang down onto the floor.  Have a firm chair that has side arms. You can use this for support while you get dressed.  Do not have throw rugs and other things on the floor that can make you trip. What can I do in the kitchen?  Clean up any spills right away.  Avoid walking on wet floors.  Keep items that you use a lot in easy-to-reach places.  If you need to reach something above you, use a strong step stool that has a grab bar.  Keep electrical cords out of the way.  Do not use floor polish or wax that makes floors slippery. If you must use wax, use non-skid floor wax.  Do not have throw rugs and other things on the floor that  can make you trip. What can I do with my stairs?  Do not leave any items on the stairs.  Make sure that there are handrails on both sides of the stairs and use them. Fix handrails that are broken or loose. Make sure that handrails are as long as the stairways.  Check any carpeting to make sure that it is firmly attached to the stairs. Fix any carpet that is loose or worn.  Avoid having throw rugs at the top or bottom of the stairs. If you do have throw rugs, attach them to the floor with carpet tape.  Make sure that you have a light switch at the top of the stairs and the bottom of the stairs. If you do not have them, ask someone to add them for you. What else can I do to help prevent falls?  Wear shoes that:  Do not have high heels.  Have rubber bottoms.  Are comfortable and fit you well.  Are  closed at the toe. Do not wear sandals.  If you use a stepladder:  Make sure that it is fully opened. Do not climb a closed stepladder.  Make sure that both sides of the stepladder are locked into place.  Ask someone to hold it for you, if possible.  Clearly mark and make sure that you can see:  Any grab bars or handrails.  First and last steps.  Where the edge of each step is.  Use tools that help you move around (mobility aids) if they are needed. These include:  Canes.  Walkers.  Scooters.  Crutches.  Turn on the lights when you go into a dark area. Replace any light bulbs as soon as they burn out.  Set up your furniture so you have a clear path. Avoid moving your furniture around.  If any of your floors are uneven, fix them.  If there are any pets around you, be aware of where they are.  Review your medicines with your doctor. Some medicines can make you feel dizzy. This can increase your chance of falling. Ask your doctor what other things that you can do to help prevent falls. This information is not intended to replace advice given to you by your health care provider. Make sure you discuss any questions you have with your health care provider. Document Released: 06/28/2009 Document Revised: 02/07/2016 Document Reviewed: 10/06/2014 Elsevier Interactive Patient Education  2017 [employer].

## 2020-06-12 ENCOUNTER — Other Ambulatory Visit: Payer: Self-pay | Admitting: Family Medicine

## 2020-06-12 DIAGNOSIS — I10 Essential (primary) hypertension: Secondary | ICD-10-CM

## 2020-06-18 DIAGNOSIS — R69 Illness, unspecified: Secondary | ICD-10-CM | POA: Diagnosis not present

## 2020-06-21 ENCOUNTER — Telehealth: Payer: Self-pay | Admitting: Family Medicine

## 2020-06-21 NOTE — Telephone Encounter (Signed)
Pt's daughter called in stating that she has an appt next Friday to have her cpe and prolia, she wanted to know if she could get her flu shot at that time?  She also wanted to know if were to get her Covid booster would that effect her prolia if so how long should she wait in between to have that done.   Please call lori at [phone]

## 2020-06-21 NOTE — Telephone Encounter (Signed)
Patient daughter notified of PCP recommendations and is agreement and expresses an understanding.

## 2020-06-21 NOTE — Telephone Encounter (Signed)
Please advise, Pt Ellen Beltran was ordered already and should be in the office October 12th.

## 2020-06-21 NOTE — Telephone Encounter (Signed)
Prolia and flu shot are fine to give at the same visit.  The COVID booster will have no impact on her Prolia injection or vice versa

## 2020-06-25 ENCOUNTER — Telehealth: Payer: Medicare HMO

## 2020-06-28 ENCOUNTER — Other Ambulatory Visit: Payer: Self-pay

## 2020-06-28 ENCOUNTER — Ambulatory Visit: Payer: Medicare HMO

## 2020-06-28 NOTE — Chronic Care Management (AMB) (Signed)
  Chronic Care Management   Outreach Note   Name: Ellen Beltran MRN: [id number] DOB: 1946/03/30  Referred by: Midge Minium, MD Reason for referral: Telephone Appointment with [facility] Pharmacist, Madelin Rear.   An unsuccessful telephone outreach was attempted today. The patient was referred to the pharmacist for assistance with care management and care coordination.   Telephone appointment with clinical pharmacist today (06/28/2020) at 830am. If patient immediately returns call, transfer to [phone]. Otherwise, please provide this number so patient can reschedule visit.   Madelin Rear, Pharm.D., BCGP Clinical Pharmacist [facility] [phone]

## 2020-06-28 NOTE — Progress Notes (Unsigned)
Chronic Care Management Pharmacy  Name: Ellen Beltran  MRN: [id number] DOB: [birth year]   ***not seen  Chief Complaint/ HPI  Ellen Beltran,  74 y.o., female presents for their {Initial/Follow-up:3041532} CCM visit with the clinical pharmacist {CHL HP Upstream Pharm visit VDIX:[id number]}.  PCP : Midge Minium, MD  Chronic conditions include:  No diagnosis found.  Office Visits:  ***  Consult Visit: *** Patient Active Problem List   Diagnosis Date Noted  . Upper airway cough syndrome 03/02/2018  . Abnormal CXR 03/02/2018  . Allergic rhinitis 12/10/2017  . Osteoarthritis 11/18/2017  . Essential hypertension 07/22/2017  . Osteoporosis 10/22/2016  . Hyperlipidemia 01/17/2016  . Physical exam 01/17/2016  . Anemia of chronic disease 09/23/2013  . Cancer of central portion of left female breast ([place]) 04/20/2013   Past Surgical History:  Procedure Laterality Date  . BREAST LUMPECTOMY Left 10/17/2013  . BREAST LUMPECTOMY WITH NEEDLE LOCALIZATION AND AXILLARY SENTINEL LYMPH NODE BX Left 10/17/2013   Procedure: BREAST LUMPECTOMY WITH NEEDLE LOCALIZATION AND AXILLARY SENTINEL LYMPH NODE BX;  Surgeon: Shann Medal, MD;  Location: [facility];  Service: General;  Laterality: Left;  . CARDIAC CATHETERIZATION  08/31/2003   no disease  . COLONOSCOPY WITH PROPOFOL  06/22/2012  . PELVIC FLOOR REPAIR  05/06/2001   cardinal uterosacral colposuspension  . PORTACATH PLACEMENT N/A 05/02/2013   Procedure: INSERTION PORT-A-CATH;  Surgeon: Shann Medal, MD;  Location: [facility];  Service: General;  Laterality: N/A;  . TOTAL ABDOMINAL HYSTERECTOMY W/ BILATERAL SALPINGOOPHORECTOMY  05/06/2001  . TUBAL LIGATION  age 22   Family History  Problem Relation Age of Onset  . Heart disease Father   . Brain cancer Father   . Heart disease Brother   . Diabetes Brother   . Lung cancer Mother   . Thyroid disease Sister   . Thyroid disease Sister   . Thyroid disease Sister   .  Cancer Maternal Uncle        pancreatic  . Cancer Maternal Uncle        colon   Allergies  Allergen Reactions  . Codeine Other (See Comments)    HALLUCINATIONS  . Morphine And Related Nausea Only  . Sulfa Antibiotics Rash   Outpatient Encounter Medications as of 06/28/2020  Medication Sig  . anastrozole (ARIMIDEX) 1 MG tablet TAKE 1 TABLET (1 MG TOTAL) BY MOUTH DAILY.  . Calcium Citrate-Vitamin D (CALCIUM CITRATE + D3 PO) Take by mouth daily. 400-12.5 - 3 tabs daily  . cetirizine (ZYRTEC) 10 MG tablet Take 1 tablet (10 mg total) by mouth daily as needed for allergies.  . Cholecalciferol (VITAMIN D PO) Take 5,000 mg by mouth daily.  . fluticasone (FLONASE) 50 MCG/ACT nasal spray PLACE 1 SPRAY INTO BOTH NOSTRILS DAILY AS NEEDED. CONGESTION  . irbesartan (AVAPRO) 75 MG tablet Take 1 tablet by mouth once daily  . Multiple Vitamins-Minerals (ALIVE WOMENS 50+ PO) Take 1 each by mouth daily.   Marland Kitchen PROLIA 60 MG/ML SOSY injection TO BE ADMINISTERED IN PHYSICIAN'S OFFICE. INJECT ONE SYRINGE UNDE SKIN ONCE EVERY 6 MONTHS. REFRIGERATE. USE WITHIN 14 DAYS ONCE AT ROOM TEMPERATURE.  . simvastatin (ZOCOR) 40 MG tablet TAKE 1 TABLET (40 MG TOTAL) BY MOUTH EVERY EVENING.   No facility-administered encounter medications on file as of 06/28/2020.   Patient Care Team    Relationship Specialty Notifications Start End  Midge Minium, MD PCP - General Family Medicine  01/17/16   Thea Silversmith, MD Consulting  Physician Radiation Oncology  05/02/13   Nicholas Lose, MD Consulting Physician Hematology and Oncology  01/17/16   Alphonsa Overall, MD Consulting Physician General Surgery  01/27/18   Madelin Rear, [facility] Pharmacist Pharmacist  12/20/19    Comment: phone number [phone]   Current Diagnosis/Assessment: Goals Addressed   None    Hypertension   BP goal {CHL HP UPSTREAM Pharmacist BP ranges:[id number]}  BP Readings from Last 3 Encounters:  02/22/20 128/81  12/28/19 123/68  10/21/19 (!) 159/73     Patient has failed these meds in the past: *** Patient checks BP at home {CHL HP BP Monitoring [phone]} Patient home BP readings are ranging: ***  Patient is currently {CHL Controlled/Uncontrolled:[phone]} on the following medications:  . Irbesartan 75 mg once daily  We discussed {CHL HP Upstream Pharmacy [phone]}.  Plan  Continue {CHL HP Upstream Pharmacy Plans:[phone]}.   Hyperlipidemia   LDL goal < ***  Lipid Panel     Component Value Date/Time   CHOL 226 (H) 12/28/2019 1352   TRIG 119.0 12/28/2019 1352   HDL 67.80 12/28/2019 1352   LDLCALC 134 (H) 12/28/2019 1352    Hepatic Function Latest Ref Rng & Units 12/28/2019 06/10/2019 07/21/2018  Total Protein 6.0 - 8.3 g/dL 6.7 7.0 7.5  Albumin 3.5 - 5.2 g/dL 4.5 4.7 4.7  AST 0 - 37 U/L '18 18 19  ' ALT 0 - 35 U/L '16 17 18  ' Alk Phosphatase 39 - 117 U/L 60 67 62  Total Bilirubin 0.2 - 1.2 mg/dL 0.4 0.4 0.4  Bilirubin, Direct 0.0 - 0.3 mg/dL 0.1 0.1 0.1    The 10-year ASCVD risk score Mikey Bussing DC Jr., et al., 2013) is: 15.6%   Values used to calculate the score:     Age: 46 years     Sex: Female     Is Non-Hispanic African American: No     Diabetic: No     Tobacco smoker: No     Systolic Blood Pressure: 500 mmHg     Is BP treated: Yes     HDL Cholesterol: 67.8 mg/dL     Total Cholesterol: 226 mg/dL   Patient has failed these meds in past: *** Patient is currently {CHL Controlled/Uncontrolled:[phone]} on the following medications:  . ***  We discussed:  {CHL HP Upstream Pharmacy [phone]}.  Plan  Continue {CHL HP Upstream Pharmacy Plans:[phone]}.  Hypothyroidism   Lab Results  Component Value Date/Time   TSH 2.20 12/28/2019 01:52 PM   TSH 2.65 06/10/2019 11:46 AM    Patient has failed these meds in past: *** Patient is currently {CHL Controlled/Uncontrolled:[phone]} on the following medications:  . ***  We discussed:  {CHL HP Upstream Pharmacy  [phone]}.  Plan  Continue {CHL HP Upstream Pharmacy Plans:[phone]}.  GERD   Patient {Actions; denies-reports:120008::"denies"} recent acid reflux.  Currently {CHL Controlled/Uncontrolled:[phone]} on: . ***  We discussed: Avoidance of potential triggers such as {causes; exacerbators GERD:13199}.  Plan   Continue {CHL HP Upstream Pharmacy Plans:[phone]}.  Osteopenia / Osteoporosis   Last DEXA Scan: ***   VITD  Date Value Ref Range Status  06/10/2019 98.43 30.00 - 100.00 ng/mL Final     Patient {is;is not an osteoporosis candidate:23886}  Patient has failed these meds in past: *** Patient is currently {CHL Controlled/Uncontrolled:[phone]} on the following medications:  . ***  We discussed:  {Osteoporosis Counseling:23892}.  Plan  Continue {CHL HP Upstream Pharmacy Plans:[phone]}.  Depression   PHQ9 SCORE ONLY 06/11/2020 02/22/2020 12/28/2019  PHQ-9 Total Score 0 0 0  Patient has failed these meds in past: *** Patient is currently {CHL Controlled/[phone]} on the following medications:  . ***  We discussed:  ***  Plan  Continue {CHL HP Upstream Pharmacy Plans:[id number]}.  ***   Patient has failed these meds in past: *** Patient is currently {CHL Controlled/[phone]} on the following medications:  . ***  We discussed:  ***  Plan  Continue {CHL HP Upstream Pharmacy Plans:5200375286}.  Vaccines   Immunization History  Administered Date(s) Administered  . Fluad Quad(high Dose 65+) 06/10/2019  . Influenza Split 06/09/2014  . Influenza, High Dose Seasonal PF 06/17/2018  . Influenza,inj,Quad PF,6+ Mos 07/29/2013  . Influenza-Unspecified 06/04/2015, 05/23/2016, 06/16/2017  . PFIZER SARS-COV-2 Vaccination 11/07/2018, 11/29/2019  . Pneumococcal Conjugate-13 02/27/2014  . Pneumococcal Polysaccharide-23 02/06/2015  . Tdap 01/02/2016  . Zoster 03/17/2014  . Zoster Recombinat (Shingrix) 05/15/2017,  08/11/2017    Reviewed and discussed patient's vaccination history.    Plan  Recommended patient receive *** vaccine in ***.   Medication Management Coordination   Receives prescription medications from:  [id number] [place] [place] - [address] [place] [address]Phone: [phone]: [address] [place] [address] [place]Phone: [phone]: [phone] [facility] [place]Phone: [phone]: [phone]   *** Plan  {US Pharmacy PUNMM:60888} ___________________________ SDOH (Social Determinants of Health) assessments performed: Yes.  Future Appointments  Date Time Provider DEagle 06/28/2020  8:30 AM [facility] CCM PHARMACIST [facility]  06/29/2020  9:00 AM TMidge Minium MD [facility]  10/22/2020 10:15 AM GNicholas Lose MD [facility] None  06/17/2021 11:15 AM [facility] HEALTH COACH [facility]   Visit follow-up:  . CPA follow-up: ***. .Marland KitchenRPH follow-up: *** month *** visit.  JMadelin Rear Pharm.D., BCGP Clinical Pharmacist [facility] ([phone]

## 2020-06-29 ENCOUNTER — Ambulatory Visit (INDEPENDENT_AMBULATORY_CARE_PROVIDER_SITE_OTHER): Payer: Medicare HMO | Admitting: Family Medicine

## 2020-06-29 ENCOUNTER — Encounter: Payer: Self-pay | Admitting: Family Medicine

## 2020-06-29 ENCOUNTER — Other Ambulatory Visit: Payer: Self-pay

## 2020-06-29 VITALS — BP 121/68 | HR 76 | Temp 98.6°F | Resp 16 | Ht <= 58 in | Wt 102.4 lb

## 2020-06-29 DIAGNOSIS — Z0000 Encounter for general adult medical examination without abnormal findings: Secondary | ICD-10-CM

## 2020-06-29 DIAGNOSIS — Z1231 Encounter for screening mammogram for malignant neoplasm of breast: Secondary | ICD-10-CM

## 2020-06-29 DIAGNOSIS — Z23 Encounter for immunization: Secondary | ICD-10-CM

## 2020-06-29 DIAGNOSIS — I10 Essential (primary) hypertension: Secondary | ICD-10-CM

## 2020-06-29 DIAGNOSIS — M810 Age-related osteoporosis without current pathological fracture: Secondary | ICD-10-CM | POA: Diagnosis not present

## 2020-06-29 LAB — CBC WITH DIFFERENTIAL/PLATELET
Basophils Absolute: 0.1 10*3/uL (ref 0.0–0.1)
Basophils Relative: 1 % (ref 0.0–3.0)
Eosinophils Absolute: 0.2 10*3/uL (ref 0.0–0.7)
Eosinophils Relative: 2.4 % (ref 0.0–5.0)
HCT: 34.8 % — ABNORMAL LOW (ref 36.0–46.0)
Hemoglobin: 11.7 g/dL — ABNORMAL LOW (ref 12.0–15.0)
Lymphocytes Relative: 31.4 % (ref 12.0–46.0)
Lymphs Abs: 2.6 10*3/uL (ref 0.7–4.0)
MCHC: 33.7 g/dL (ref 30.0–36.0)
MCV: 91.1 fl (ref 78.0–100.0)
Monocytes Absolute: 0.7 10*3/uL (ref 0.1–1.0)
Monocytes Relative: 8.7 % (ref 3.0–12.0)
Neutro Abs: 4.7 10*3/uL (ref 1.4–7.7)
Neutrophils Relative %: 56.5 % (ref 43.0–77.0)
Platelets: 352 10*3/uL (ref 150.0–400.0)
RBC: 3.82 Mil/uL — ABNORMAL LOW (ref 3.87–5.11)
RDW: 14.1 % (ref 11.5–15.5)
WBC: 8.2 10*3/uL (ref 4.0–10.5)

## 2020-06-29 LAB — LIPID PANEL
Cholesterol: 224 mg/dL — ABNORMAL HIGH (ref 0–200)
HDL: 74.7 mg/dL (ref >39.00)
LDL Cholesterol: 127 mg/dL — ABNORMAL HIGH (ref 0–99)
NonHDL: 149.76
Total CHOL/HDL Ratio: 3
Triglycerides: 114 mg/dL (ref 0.0–149.0)
VLDL: 22.8 mg/dL (ref 0.0–40.0)

## 2020-06-29 LAB — HEPATIC FUNCTION PANEL
ALT: 15 U/L (ref 0–35)
AST: 18 U/L (ref 0–37)
Albumin: 4.6 g/dL (ref 3.5–5.2)
Alkaline Phosphatase: 65 U/L (ref 39–117)
Bilirubin, Direct: 0.1 mg/dL (ref 0.0–0.3)
Total Bilirubin: 0.6 mg/dL (ref 0.2–1.2)
Total Protein: 7.3 g/dL (ref 6.0–8.3)

## 2020-06-29 LAB — BASIC METABOLIC PANEL
BUN: 20 mg/dL (ref 6–23)
CO2: 31 mEq/L (ref 19–32)
Calcium: 10.4 mg/dL (ref 8.4–10.5)
Chloride: 100 mEq/L (ref 96–112)
Creatinine, Ser: 1.01 mg/dL (ref 0.40–1.20)
GFR: 55.21 mL/min — ABNORMAL LOW (ref >60.00)
Glucose, Bld: 92 mg/dL (ref 70–99)
Potassium: 4.2 mEq/L (ref 3.5–5.1)
Sodium: 140 mEq/L (ref 135–145)

## 2020-06-29 LAB — VITAMIN D 25 HYDROXY (VIT D DEFICIENCY, FRACTURES): VITD: 91.07 ng/mL (ref 30.00–100.00)

## 2020-06-29 LAB — TSH: TSH: 3.46 u[IU]/mL (ref 0.35–4.50)

## 2020-06-29 MED ORDER — DENOSUMAB 60 MG/ML ~~LOC~~ SOSY
60.0000 mg | PREFILLED_SYRINGE | Freq: Once | SUBCUTANEOUS | Status: AC
  Administered 2020-06-29: 60 mg via SUBCUTANEOUS

## 2020-06-29 NOTE — Progress Notes (Signed)
   Subjective:    Patient ID: Ellen Beltran, female    DOB: May 18, 1946, 74 y.o.   MRN: [id number]  HPI CPE- UTD on mammo, colonoscopy, Tdap, COVID, DEXA.  Due for flu.  Reviewed past medical, surgical, family and social histories.   Patient Care Team    Relationship Specialty Notifications Start End  Midge Minium, MD PCP - General Family Medicine  01/17/16   Thea Silversmith, MD Consulting Physician Radiation Oncology  05/02/13   Nicholas Lose, MD Consulting Physician Hematology and Oncology  01/17/16   Alphonsa Overall, MD Consulting Physician General Surgery  01/27/18   Madelin Rear, [facility] Pharmacist Pharmacist  12/20/19    Comment: phone number [phone]    Health Maintenance  Topic Date Due  . INFLUENZA VACCINE  04/15/2020  . MAMMOGRAM  08/21/2020  . COLONOSCOPY  06/22/2022  . TETANUS/TDAP  01/01/2026  . DEXA SCAN  Completed  . COVID-19 Vaccine  Completed  . Hepatitis C Screening  Completed  . PNA vac Low Risk Adult  Completed      Review of Systems Patient reports no vision/ hearing changes, adenopathy,fever, weight change,  persistant/recurrent hoarseness , swallowing issues, chest pain, palpitations, edema, persistant/recurrent cough, hemoptysis, dyspnea (rest/exertional/paroxysmal nocturnal), gastrointestinal bleeding (melena, rectal bleeding), abdominal pain, significant heartburn, bowel changes, GU symptoms (dysuria, hematuria, incontinence), Gyn symptoms (abnormal  bleeding, pain),  syncope, focal weakness, memory loss, skin/hair/nail changes, abnormal bruising or bleeding, anxiety, or depression.   + chemo neuropathy  This visit occurred during the SARS-CoV-2 public health emergency.  Safety protocols were in place, including screening questions prior to the visit, additional usage of staff PPE, and extensive cleaning of exam room while observing appropriate contact time as indicated for disinfecting solutions.       Objective:   Physical Exam          Assessment & Plan:

## 2020-06-29 NOTE — Assessment & Plan Note (Signed)
Pt's PE WNL.  UTD on colonoscopy, mammo, COVID, Tdap, Pneumonia vaccines.  Flu shot given today.  Check labs.  Anticipatory guidance provided.

## 2020-06-29 NOTE — Addendum Note (Signed)
Addended by: Davis Gourd on: 06/29/2020 09:47 AM   Modules accepted: Orders

## 2020-06-29 NOTE — Patient Instructions (Addendum)
Follow up in 6 months to recheck BP and cholesterol We'll notify you of your lab results and make any changes if needed Keep up the good work!  You look great! You are up to date on all the things!  CONGRATS!!! Stay Safe!  Stay Healthy!

## 2020-06-29 NOTE — Assessment & Plan Note (Signed)
Chronic problem.  Well controlled today on Irbesartan 75mg  daily.  Currently asymptomatic.  Check labs but no anticipated med changes.

## 2020-06-29 NOTE — Assessment & Plan Note (Signed)
Pt is due for DEXA in December.  This was ordered today and Prolia was given.

## 2020-07-03 ENCOUNTER — Telehealth: Payer: Self-pay

## 2020-07-03 NOTE — Progress Notes (Signed)
Chronic Care Management Pharmacy Assistant   Name: Ellen Beltran  MRN: [id number] DOB: [birth year]  Reason for Encounter: Disease State   PCP : Midge Minium, MD  Allergies:   Allergies  Allergen Reactions  . Codeine Other (See Comments)    HALLUCINATIONS  . Morphine And Related Nausea Only  . Sulfa Antibiotics Rash    Medications: Outpatient Encounter Medications as of 07/03/2020  Medication Sig  . anastrozole (ARIMIDEX) 1 MG tablet TAKE 1 TABLET (1 MG TOTAL) BY MOUTH DAILY.  . Calcium Citrate-Vitamin D (CALCIUM CITRATE + D3 PO) Take by mouth daily. 400-12.5 - 3 tabs daily  . cetirizine (ZYRTEC) 10 MG tablet Take 1 tablet (10 mg total) by mouth daily as needed for allergies.  . Cholecalciferol (VITAMIN D PO) Take 5,000 mg by mouth daily.  . fluticasone (FLONASE) 50 MCG/ACT nasal spray PLACE 1 SPRAY INTO BOTH NOSTRILS DAILY AS NEEDED. CONGESTION  . irbesartan (AVAPRO) 75 MG tablet Take 1 tablet by mouth once daily  . Multiple Vitamins-Minerals (ALIVE WOMENS 50+ PO) Take 1 each by mouth daily.   Marland Kitchen PROLIA 60 MG/ML SOSY injection TO BE ADMINISTERED IN PHYSICIAN'S OFFICE. INJECT ONE SYRINGE UNDE SKIN ONCE EVERY 6 MONTHS. REFRIGERATE. USE WITHIN 14 DAYS ONCE AT ROOM TEMPERATURE.  . simvastatin (ZOCOR) 40 MG tablet TAKE 1 TABLET (40 MG TOTAL) BY MOUTH EVERY EVENING.   No facility-administered encounter medications on file as of 07/03/2020.    Current Diagnosis: Patient Active Problem List   Diagnosis Date Noted  . Upper airway cough syndrome 03/02/2018  . Abnormal CXR 03/02/2018  . Allergic rhinitis 12/10/2017  . Osteoarthritis 11/18/2017  . Essential hypertension 07/22/2017  . Osteoporosis 10/22/2016  . Hyperlipidemia 01/17/2016  . Physical exam 01/17/2016  . Anemia of chronic disease 09/23/2013  . Cancer of central portion of left female breast ([place]) 04/20/2013     Reviewed chart prior to disease state call. Spoke with patient regarding BP  Recent Office  Vitals: BP Readings from Last 3 Encounters:  06/29/20 121/68  02/22/20 128/81  12/28/19 123/68   Pulse Readings from Last 3 Encounters:  06/29/20 76  02/22/20 76  12/28/19 79    Wt Readings from Last 3 Encounters:  06/29/20 102 lb 6 oz (46.4 kg)  06/11/20 103 lb (46.7 kg)  02/22/20 103 lb 8 oz (46.9 kg)     Kidney Function Lab Results  Component Value Date/Time   CREATININE 1.01 06/29/2020 09:41 AM   CREATININE 0.90 12/28/2019 01:52 PM   CREATININE 0.70 01/17/2016 03:41 PM   CREATININE 0.8 03/23/2015 09:12 AM   CREATININE 0.8 09/22/2014 08:38 AM   GFR 55.21 (L) 06/29/2020 09:41 AM   GFRNONAA 65 (L) 07/26/2014 06:00 AM   GFRAA 76 (L) 07/26/2014 06:00 AM    BMP Latest Ref Rng & Units 06/29/2020 12/28/2019 06/10/2019  Glucose 70 - 99 mg/dL 92 89 94  BUN 6 - 23 mg/dL 20 17 13   Creatinine 0.40 - 1.20 mg/dL 1.01 0.90 0.87  Sodium 135 - 145 mEq/L 140 140 141  Potassium 3.5 - 5.1 mEq/L 4.2 4.3 4.4  Chloride 96 - 112 mEq/L 100 102 102  CO2 19 - 32 mEq/L 31 30 29   Calcium 8.4 - 10.5 mg/dL 10.4 9.8 10.4    . Current antihypertensive regimen:  irbesartan (AVAPRO) 75 MG tablet . How often are you checking your Blood Pressure? 1-2x per week . Current home BP readings: 130/70 . What recent interventions/DTPs have been made by any provider  to improve Blood Pressure control since last CPP Visit: None Noted . Any recent hospitalizations or ED visits since last visit with CPP? No . What diet changes have been made to improve Blood Pressure Control?  o Patient states she does not follow a diet plan. . What exercise is being done to improve your Blood Pressure Control?  o Patient states she does get exercise. Patient Walks the dog everyday.   Adherence Review: Is the patient currently on ACE/ARB medication? Yes Does the patient have >5 day gap between last estimated fill dates? No    Spoke with patient to reschedule a follow up appointment. Patient states she does not want to  reschedule at this time.   [place] ,[facility] Pharmacist Assistant [phone]   Follow-Up:  Pharmacist Review

## 2020-09-05 ENCOUNTER — Telehealth: Payer: Self-pay | Admitting: Hematology and Oncology

## 2020-09-05 NOTE — Telephone Encounter (Signed)
Following up on a call to reschedule appointment. Left message for patient to call back to reschedule appointment.

## 2020-09-05 NOTE — Telephone Encounter (Signed)
Left message to reschedule appointment due to provider PAL. 

## 2020-09-17 ENCOUNTER — Other Ambulatory Visit: Payer: Self-pay | Admitting: Family Medicine

## 2020-09-17 DIAGNOSIS — I10 Essential (primary) hypertension: Secondary | ICD-10-CM

## 2020-10-22 ENCOUNTER — Ambulatory Visit: Payer: Medicare HMO | Admitting: Hematology and Oncology

## 2020-10-22 NOTE — Progress Notes (Signed)
Patient Care Team: Midge Minium, MD as PCP - General (Family Medicine) Thea Silversmith, MD as Consulting Physician (Radiation Oncology) Nicholas Lose, MD as Consulting Physician (Hematology and Oncology) Alphonsa Overall, MD as Consulting Physician (General Surgery) Madelin Rear, [facility] as Pharmacist (Pharmacist)  DIAGNOSIS:    ICD-10-CM   1. Malignant neoplasm of central portion of left breast in female, estrogen receptor positive ([place])  C50.112    Z17.0     SUMMARY OF ONCOLOGIC HISTORY: Oncology History  Cancer of central portion of left female breast ([place])  04/15/2013 Initial Diagnosis   Left breast biopsy invasive ductal carcinoma with papillary features intermediate to high-grade, ER negative PR negative HER-2 negative Ki-67 47%   04/25/2013 Breast MRI   Left breast 12:00 position 2.4 cm area of abnormality, no lymph nodes   05/13/2013 - 09/02/2013 Neo-Adjuvant Chemotherapy   Adriamycin and Cytoxan 4 cycles followed by Taxol and carboplatin completed 9 out of 12 stopped due to Neuropathy   10/17/2013 Surgery   Left breast lumpectomy: 0.4 cm residual invasive ductal carcinoma 1 sentinel lymph node negative ER 52%, ER 0%, HER-2 negative ratio 1.18   11/14/2013 - 12/30/2013 Radiation Therapy   Adjuvant radiation therapy   01/20/2014 -  Anti-estrogen oral therapy   Arimidex 1 mg daily plan is for 5 years     CHIEF COMPLIANT: Follow-up of left breast cancer on anastrozole  INTERVAL HISTORY: Ellen Beltran is a 75 y.o. with above-mentioned history of left breast cancer treated with neoadjuvant chemotherapy, lumpectomy,radiation, andwho is currently on anastrozole therapy. She presents to the clinic today for annual follow-up.  ALLERGIES:  is allergic to codeine, morphine and related, and sulfa antibiotics.  MEDICATIONS:  Current Outpatient Medications  Medication Sig Dispense Refill   Calcium Citrate-Vitamin D (CALCIUM CITRATE + D3 PO) Take by mouth daily. 400-12.5 - 3  tabs daily     cetirizine (ZYRTEC) 10 MG tablet Take 1 tablet (10 mg total) by mouth daily as needed for allergies.     Cholecalciferol (VITAMIN D PO) Take 5,000 mg by mouth daily.     fluticasone (FLONASE) 50 MCG/ACT nasal spray PLACE 1 SPRAY INTO BOTH NOSTRILS DAILY AS NEEDED. CONGESTION 16 g 0   irbesartan (AVAPRO) 75 MG tablet Take 1 tablet by mouth once daily 90 tablet 1   Multiple Vitamins-Minerals (ALIVE WOMENS 50+ PO) Take 1 each by mouth daily.      PROLIA 60 MG/ML SOSY injection TO BE ADMINISTERED IN PHYSICIAN'S OFFICE. INJECT ONE SYRINGE UNDE SKIN ONCE EVERY 6 MONTHS. REFRIGERATE. USE WITHIN 14 DAYS ONCE AT ROOM TEMPERATURE. 60 mL 0   simvastatin (ZOCOR) 40 MG tablet TAKE 1 TABLET (40 MG TOTAL) BY MOUTH EVERY EVENING. 90 tablet 2   No current facility-administered medications for this visit.    PHYSICAL EXAMINATION: ECOG PERFORMANCE STATUS: 1 - Symptomatic but completely ambulatory  Vitals:   10/23/20 0945  BP: (!) 141/63  Pulse: 96  Resp: 18  Temp: 99 F (37.2 C)  SpO2: 95%   Filed Weights   10/23/20 0945  Weight: 102 lb 14.4 oz (46.7 kg)     LABORATORY DATA:  I have reviewed the data as listed CMP Latest Ref Rng & Units 06/29/2020 12/28/2019 06/10/2019  Glucose 70 - 99 mg/dL 92 89 94  BUN 6 - 23 mg/dL '20 17 13  ' Creatinine 0.40 - 1.20 mg/dL 1.01 0.90 0.87  Sodium 135 - 145 mEq/L 140 140 141  Potassium 3.5 - 5.1 mEq/L 4.2 4.3 4.4  Chloride 96 - 112 mEq/L 100 102 102  CO2 19 - 32 mEq/L '31 30 29  ' Calcium 8.4 - 10.5 mg/dL 10.4 9.8 10.4  Total Protein 6.0 - 8.3 g/dL 7.3 6.7 7.0  Total Bilirubin 0.2 - 1.2 mg/dL 0.6 0.4 0.4  Alkaline Phos 39 - 117 U/L 65 60 67  AST 0 - 37 U/L '18 18 18  ' ALT 0 - 35 U/L '15 16 17    ' Lab Results  Component Value Date   WBC 8.2 06/29/2020   HGB 11.7 (L) 06/29/2020   HCT 34.8 (L) 06/29/2020   MCV 91.1 06/29/2020   PLT 352.0 06/29/2020   NEUTROABS 4.7 06/29/2020    ASSESSMENT & PLAN:  Cancer of central portion of left  female breast Left breast invasive ductal carcinoma clinical stage IIA initially triple negative with a Ki-67 of 47% underwent neoadjuvant chemotherapy followed by left breast lumpectomy, residual 0.4 cm tumor but it was ER positive PR negative HER-2 negative, completed adjuvant radiation therapy and is currently on Arimidex 1 mg daily since May 2015  Arimidex-related toxicities: 1.Osteoporosis: T score minus 2 on12/01/2018: Currently on Prolia injections along with calcium and vitamin D 2. Intermittent hot flashes  She completes 7 years by May 2022. She will finish her supply and stop it.  Mammograms and bone density scheduled for tomorrow RTC on an as needed basis    No orders of the defined types were placed in this encounter.  The patient has a good understanding of the overall plan. she agrees with it. she will call with any problems that may develop before the next visit here.  Total time spent: 20 mins including face to face time and time spent for planning, charting and coordination of care  Rulon Eisenmenger, MD, MPH 10/23/2020  I, Cloyde Reams Dorshimer, am acting as scribe for Dr. Nicholas Lose.  I have reviewed the above documentation for accuracy and completeness, and I agree with the above.

## 2020-10-23 ENCOUNTER — Other Ambulatory Visit: Payer: Self-pay

## 2020-10-23 ENCOUNTER — Inpatient Hospital Stay: Payer: Medicare HMO | Attending: Hematology and Oncology | Admitting: Hematology and Oncology

## 2020-10-23 DIAGNOSIS — Z79811 Long term (current) use of aromatase inhibitors: Secondary | ICD-10-CM | POA: Insufficient documentation

## 2020-10-23 DIAGNOSIS — C50112 Malignant neoplasm of central portion of left female breast: Secondary | ICD-10-CM

## 2020-10-23 DIAGNOSIS — M810 Age-related osteoporosis without current pathological fracture: Secondary | ICD-10-CM | POA: Diagnosis not present

## 2020-10-23 DIAGNOSIS — R232 Flushing: Secondary | ICD-10-CM | POA: Diagnosis not present

## 2020-10-23 DIAGNOSIS — Z170 Estrogen receptor positive status [ER+]: Secondary | ICD-10-CM | POA: Diagnosis not present

## 2020-10-23 DIAGNOSIS — Z79899 Other long term (current) drug therapy: Secondary | ICD-10-CM | POA: Diagnosis not present

## 2020-10-23 NOTE — Assessment & Plan Note (Signed)
Left breast invasive ductal carcinoma clinical stage IIA initially triple negative with a Ki-67 of 47% underwent neoadjuvant chemotherapy followed by left breast lumpectomy, residual 0.4 cm tumor but it was ER positive PR negative HER-2 negative, completed adjuvant radiation therapy and is currently on Arimidex 1 mg daily since May 2015  Arimidex-related toxicities: 1.Osteoporosis: T score minus 2 on12/01/2018: Currently on Prolia injections along with calcium and vitamin D 2. Intermittent hot flashes  She completes 7 years by May 2022. She will finish her supply and stop it. RTC on an as needed basis

## 2020-10-24 ENCOUNTER — Ambulatory Visit
Admission: RE | Admit: 2020-10-24 | Discharge: 2020-10-24 | Disposition: A | Discharge status: Home / Self Care | Payer: Medicare HMO | Source: Ambulatory Visit | Attending: Family Medicine | Admitting: Family Medicine

## 2020-10-24 ENCOUNTER — Telehealth: Payer: Self-pay | Admitting: Hematology and Oncology

## 2020-10-24 ENCOUNTER — Other Ambulatory Visit: Payer: Medicare HMO

## 2020-10-24 DIAGNOSIS — Z1231 Encounter for screening mammogram for malignant neoplasm of breast: Secondary | ICD-10-CM

## 2020-10-24 NOTE — Telephone Encounter (Signed)
Per 2/8 los, no changes made to pt schedule

## 2020-10-25 DIAGNOSIS — H2513 Age-related nuclear cataract, bilateral: Secondary | ICD-10-CM | POA: Diagnosis not present

## 2020-10-25 DIAGNOSIS — H524 Presbyopia: Secondary | ICD-10-CM | POA: Diagnosis not present

## 2020-10-26 ENCOUNTER — Other Ambulatory Visit: Payer: Self-pay | Admitting: Family Medicine

## 2020-11-06 ENCOUNTER — Other Ambulatory Visit: Payer: Self-pay

## 2020-11-06 ENCOUNTER — Ambulatory Visit
Admission: RE | Admit: 2020-11-06 | Discharge: 2020-11-06 | Disposition: A | Discharge status: Home / Self Care | Payer: Medicare HMO | Source: Ambulatory Visit | Attending: Family Medicine | Admitting: Family Medicine

## 2020-11-06 DIAGNOSIS — M810 Age-related osteoporosis without current pathological fracture: Secondary | ICD-10-CM

## 2020-11-06 DIAGNOSIS — Z780 Asymptomatic menopausal state: Secondary | ICD-10-CM | POA: Diagnosis not present

## 2020-11-06 DIAGNOSIS — M85851 Other specified disorders of bone density and structure, right thigh: Secondary | ICD-10-CM | POA: Diagnosis not present

## 2020-12-07 ENCOUNTER — Other Ambulatory Visit: Payer: Self-pay | Admitting: Hematology and Oncology

## 2020-12-07 DIAGNOSIS — C50112 Malignant neoplasm of central portion of left female breast: Secondary | ICD-10-CM

## 2020-12-28 ENCOUNTER — Ambulatory Visit: Payer: Medicare HMO | Admitting: Family Medicine

## 2020-12-31 ENCOUNTER — Telehealth: Payer: Self-pay

## 2020-12-31 NOTE — Telephone Encounter (Signed)
I received a call from patient pharmacy( [facility]) wanting to know the arrival date of patient prolia injection. I called patient and patient stated that she has already ordered it from the pharmacy and they gave her a doa for 01/01/21. She was just wanting to make sure that it gets here before her appt date which is 01/10/21. I informed that we would call her as as it comes in.

## 2021-01-01 NOTE — Telephone Encounter (Signed)
I have no information on this. I did not order anything for her. It should be getting shipped to [facility]

## 2021-01-01 NOTE — Telephone Encounter (Signed)
Thank you for checking. 

## 2021-01-10 ENCOUNTER — Other Ambulatory Visit: Payer: Self-pay

## 2021-01-10 ENCOUNTER — Ambulatory Visit (INDEPENDENT_AMBULATORY_CARE_PROVIDER_SITE_OTHER): Payer: Medicare HMO | Admitting: Family Medicine

## 2021-01-10 ENCOUNTER — Encounter: Payer: Self-pay | Admitting: Family Medicine

## 2021-01-10 VITALS — BP 128/70 | HR 68 | Temp 98.8°F | Resp 18 | Ht <= 58 in | Wt 106.0 lb

## 2021-01-10 DIAGNOSIS — E785 Hyperlipidemia, unspecified: Secondary | ICD-10-CM | POA: Diagnosis not present

## 2021-01-10 DIAGNOSIS — Z7185 Encounter for immunization safety counseling: Secondary | ICD-10-CM

## 2021-01-10 DIAGNOSIS — M25552 Pain in left hip: Secondary | ICD-10-CM | POA: Insufficient documentation

## 2021-01-10 DIAGNOSIS — M810 Age-related osteoporosis without current pathological fracture: Secondary | ICD-10-CM | POA: Diagnosis not present

## 2021-01-10 DIAGNOSIS — I10 Essential (primary) hypertension: Secondary | ICD-10-CM

## 2021-01-10 LAB — CBC WITH DIFFERENTIAL/PLATELET
Basophils Absolute: 0.1 10*3/uL (ref 0.0–0.1)
Basophils Relative: 0.8 % (ref 0.0–3.0)
Eosinophils Absolute: 0.2 10*3/uL (ref 0.0–0.7)
Eosinophils Relative: 1.8 % (ref 0.0–5.0)
HCT: 35.3 % — ABNORMAL LOW (ref 36.0–46.0)
Hemoglobin: 11.8 g/dL — ABNORMAL LOW (ref 12.0–15.0)
Lymphocytes Relative: 29.9 % (ref 12.0–46.0)
Lymphs Abs: 2.5 10*3/uL (ref 0.7–4.0)
MCHC: 33.3 g/dL (ref 30.0–36.0)
MCV: 89.5 fl (ref 78.0–100.0)
Monocytes Absolute: 0.8 10*3/uL (ref 0.1–1.0)
Monocytes Relative: 9.3 % (ref 3.0–12.0)
Neutro Abs: 4.9 10*3/uL (ref 1.4–7.7)
Neutrophils Relative %: 58.2 % (ref 43.0–77.0)
Platelets: 338 10*3/uL (ref 150.0–400.0)
RBC: 3.95 Mil/uL (ref 3.87–5.11)
RDW: 13.8 % (ref 11.5–15.5)
WBC: 8.5 10*3/uL (ref 4.0–10.5)

## 2021-01-10 LAB — HEPATIC FUNCTION PANEL
ALT: 16 U/L (ref 0–35)
AST: 19 U/L (ref 0–37)
Albumin: 4.5 g/dL (ref 3.5–5.2)
Alkaline Phosphatase: 64 U/L (ref 39–117)
Bilirubin, Direct: 0.1 mg/dL (ref 0.0–0.3)
Total Bilirubin: 0.4 mg/dL (ref 0.2–1.2)
Total Protein: 7.2 g/dL (ref 6.0–8.3)

## 2021-01-10 LAB — BASIC METABOLIC PANEL
BUN: 16 mg/dL (ref 6–23)
CO2: 30 mEq/L (ref 19–32)
Calcium: 10.3 mg/dL (ref 8.4–10.5)
Chloride: 102 mEq/L (ref 96–112)
Creatinine, Ser: 0.86 mg/dL (ref 0.40–1.20)
GFR: 67 mL/min (ref >60.00)
Glucose, Bld: 82 mg/dL (ref 70–99)
Potassium: 4.1 mEq/L (ref 3.5–5.1)
Sodium: 142 mEq/L (ref 135–145)

## 2021-01-10 LAB — LIPID PANEL
Cholesterol: 205 mg/dL — ABNORMAL HIGH (ref 0–200)
HDL: 70.5 mg/dL (ref >39.00)
LDL Cholesterol: 106 mg/dL — ABNORMAL HIGH (ref 0–99)
NonHDL: 134.4
Total CHOL/HDL Ratio: 3
Triglycerides: 140 mg/dL (ref 0.0–149.0)
VLDL: 28 mg/dL (ref 0.0–40.0)

## 2021-01-10 LAB — TSH: TSH: 4.46 u[IU]/mL (ref 0.35–4.50)

## 2021-01-10 MED ORDER — DENOSUMAB 60 MG/ML ~~LOC~~ SOSY
60.0000 mg | PREFILLED_SYRINGE | Freq: Once | SUBCUTANEOUS | Status: AC
  Administered 2021-01-10: 60 mg via SUBCUTANEOUS

## 2021-01-10 NOTE — Assessment & Plan Note (Signed)
New to provider, ongoing for pt.  Has been to ortho who recommended maintenance medication but pt was not interested.  They did mention next steps would be MRI and injection.  I told pt she would know when it is time to intervene.  Tylenol/Ibuprofen as needed.  Will follow along

## 2021-01-10 NOTE — Assessment & Plan Note (Signed)
Chronic problem, on Irbesartan 75mg  daily w/ good control.  Currently asymptomatic.  Check labs.  No anticipated med changes.  Will follow.

## 2021-01-10 NOTE — Progress Notes (Signed)
   Subjective:    Patient ID: Ellen Beltran, female    DOB: 08-27-1946, 75 y.o.   MRN: [id number]  HPI HTN- chronic problem, on Irbesartan 75mg  daily w/ good control.  Denies CP, SOB, HAs, visual changes, edema.  Hyperlipidemia- chronic problem, on Simvastatin 40mg  daily.  No abd pain, N/V.  L hip pain- pt has been to Ortho for evaluation.  Will take Tylenol and ibuprofen as needed.  Did not want to start regular medical therapy.  They did discuss MRI and possibly injections- pt is leery of this.  Vaccine education- pt has had both the Zostavax and the Shingrix.  Wonders if she should get the 2nd booster.  Pt had Omicron in January- did very well.   Review of Systems For ROS see HPI   This visit occurred during the SARS-CoV-2 public health emergency.  Safety protocols were in place, including screening questions prior to the visit, additional usage of staff PPE, and extensive cleaning of exam room while observing appropriate contact time as indicated for disinfecting solutions.       Objective:   Physical Exam Vitals reviewed.  Constitutional:      General: She is not in acute distress.    Appearance: Normal appearance. She is well-developed. She is not ill-appearing.  HENT:     Head: Normocephalic and atraumatic.  Eyes:     Conjunctiva/sclera: Conjunctivae normal.     Pupils: Pupils are equal, round, and reactive to light.  Neck:     Thyroid: No thyromegaly.  Cardiovascular:     Rate and Rhythm: Normal rate and regular rhythm.     Pulses: Normal pulses.     Heart sounds: Normal heart sounds. No murmur heard.   Pulmonary:     Effort: Pulmonary effort is normal. No respiratory distress.     Breath sounds: Normal breath sounds.  Abdominal:     General: There is no distension.     Palpations: Abdomen is soft.     Tenderness: There is no abdominal tenderness.  Musculoskeletal:     Cervical back: Normal range of motion and neck supple.     Right lower leg: No edema.      Left lower leg: No edema.  Lymphadenopathy:     Cervical: No cervical adenopathy.  Skin:    General: Skin is warm and dry.  Neurological:     General: No focal deficit present.     Mental Status: She is alert and oriented to person, place, and time.  Psychiatric:        Mood and Affect: Mood normal.        Behavior: Behavior normal.        Thought Content: Thought content normal.           Assessment & Plan:  Vaccine education- pt is UTD on all shingles vaccines, Tdap.  Discussed COVID booster (4th shot).  She is at low risk other than her age and well controlled HTN.  Told her to monitor #s and if cases again rise, she should get it.  Pt expressed understanding and is in agreement w/ plan.

## 2021-01-10 NOTE — Patient Instructions (Signed)
Schedule your complete physical in 6 months We'll notify you of your lab results and make any changes if needed Keep up the good work!  You look great! If the numbers start climbing, get the 2nd booster You are up to date on all other vaccines If the hip starts worsening, let us know so we can make a referral Call with any questions or concerns Happy Early Mother's Day!!!

## 2021-01-10 NOTE — Assessment & Plan Note (Signed)
Chronic problem, on Simvastatin 40mg daily w/o difficulty.  Check labs.  Adjust meds prn  

## 2021-01-10 NOTE — Assessment & Plan Note (Signed)
Prolia given.  UTD on DEXA

## 2021-03-13 ENCOUNTER — Encounter: Payer: Self-pay | Admitting: *Deleted

## 2021-03-18 ENCOUNTER — Other Ambulatory Visit: Payer: Self-pay | Admitting: Family Medicine

## 2021-03-18 DIAGNOSIS — I10 Essential (primary) hypertension: Secondary | ICD-10-CM

## 2021-05-15 DIAGNOSIS — M545 Low back pain, unspecified: Secondary | ICD-10-CM | POA: Diagnosis not present

## 2021-05-15 DIAGNOSIS — M25552 Pain in left hip: Secondary | ICD-10-CM | POA: Diagnosis not present

## 2021-06-10 DIAGNOSIS — M545 Low back pain, unspecified: Secondary | ICD-10-CM | POA: Diagnosis not present

## 2021-06-12 ENCOUNTER — Other Ambulatory Visit: Payer: Self-pay | Admitting: Family Medicine

## 2021-06-17 ENCOUNTER — Ambulatory Visit (INDEPENDENT_AMBULATORY_CARE_PROVIDER_SITE_OTHER): Payer: Medicare HMO | Admitting: Family Medicine

## 2021-06-17 ENCOUNTER — Other Ambulatory Visit: Payer: Self-pay | Admitting: Family Medicine

## 2021-06-17 ENCOUNTER — Other Ambulatory Visit: Payer: Self-pay

## 2021-06-17 ENCOUNTER — Encounter: Payer: Self-pay | Admitting: Family Medicine

## 2021-06-17 ENCOUNTER — Ambulatory Visit (INDEPENDENT_AMBULATORY_CARE_PROVIDER_SITE_OTHER): Payer: Medicare HMO | Admitting: *Deleted

## 2021-06-17 VITALS — BP 112/74 | HR 76 | Temp 98.6°F | Resp 16 | Ht <= 58 in | Wt 103.2 lb

## 2021-06-17 DIAGNOSIS — I10 Essential (primary) hypertension: Secondary | ICD-10-CM

## 2021-06-17 DIAGNOSIS — Z23 Encounter for immunization: Secondary | ICD-10-CM | POA: Diagnosis not present

## 2021-06-17 DIAGNOSIS — G620 Drug-induced polyneuropathy: Secondary | ICD-10-CM | POA: Diagnosis not present

## 2021-06-17 DIAGNOSIS — Z0000 Encounter for general adult medical examination without abnormal findings: Secondary | ICD-10-CM

## 2021-06-17 DIAGNOSIS — M810 Age-related osteoporosis without current pathological fracture: Secondary | ICD-10-CM

## 2021-06-17 LAB — HEPATIC FUNCTION PANEL
ALT: 22 U/L (ref 0–35)
AST: 19 U/L (ref 0–37)
Albumin: 4.4 g/dL (ref 3.5–5.2)
Alkaline Phosphatase: 81 U/L (ref 39–117)
Bilirubin, Direct: 0.1 mg/dL (ref 0.0–0.3)
Total Bilirubin: 0.5 mg/dL (ref 0.2–1.2)
Total Protein: 7.2 g/dL (ref 6.0–8.3)

## 2021-06-17 LAB — BASIC METABOLIC PANEL
BUN: 14 mg/dL (ref 6–23)
CO2: 29 mEq/L (ref 19–32)
Calcium: 9.9 mg/dL (ref 8.4–10.5)
Chloride: 99 mEq/L (ref 96–112)
Creatinine, Ser: 0.85 mg/dL (ref 0.40–1.20)
GFR: 67.75 mL/min (ref >60.00)
Glucose, Bld: 89 mg/dL (ref 70–99)
Potassium: 4 mEq/L (ref 3.5–5.1)
Sodium: 140 mEq/L (ref 135–145)

## 2021-06-17 LAB — CBC WITH DIFFERENTIAL/PLATELET
Basophils Absolute: 0.1 10*3/uL (ref 0.0–0.1)
Basophils Relative: 0.7 % (ref 0.0–3.0)
Eosinophils Absolute: 0.2 10*3/uL (ref 0.0–0.7)
Eosinophils Relative: 2.2 % (ref 0.0–5.0)
HCT: 38.8 % (ref 36.0–46.0)
Hemoglobin: 12.8 g/dL (ref 12.0–15.0)
Lymphocytes Relative: 19.6 % (ref 12.0–46.0)
Lymphs Abs: 1.8 10*3/uL (ref 0.7–4.0)
MCHC: 33 g/dL (ref 30.0–36.0)
MCV: 90.3 fl (ref 78.0–100.0)
Monocytes Absolute: 0.9 10*3/uL (ref 0.1–1.0)
Monocytes Relative: 9.3 % (ref 3.0–12.0)
Neutro Abs: 6.4 10*3/uL (ref 1.4–7.7)
Neutrophils Relative %: 68.2 % (ref 43.0–77.0)
Platelets: 400 10*3/uL (ref 150.0–400.0)
RBC: 4.29 Mil/uL (ref 3.87–5.11)
RDW: 14.1 % (ref 11.5–15.5)
WBC: 9.4 10*3/uL (ref 4.0–10.5)

## 2021-06-17 LAB — LIPID PANEL
Cholesterol: 236 mg/dL — ABNORMAL HIGH (ref 0–200)
HDL: 85.9 mg/dL (ref >39.00)
LDL Cholesterol: 130 mg/dL — ABNORMAL HIGH (ref 0–99)
NonHDL: 149.82
Total CHOL/HDL Ratio: 3
Triglycerides: 97 mg/dL (ref 0.0–149.0)
VLDL: 19.4 mg/dL (ref 0.0–40.0)

## 2021-06-17 LAB — TSH: TSH: 3.54 u[IU]/mL (ref 0.35–5.50)

## 2021-06-17 LAB — VITAMIN D 25 HYDROXY (VIT D DEFICIENCY, FRACTURES): VITD: 104.6 ng/mL (ref 30.00–100.00)

## 2021-06-17 NOTE — Assessment & Plan Note (Addendum)
Chronic problem, excellent control.  Check labs due to ARB but no anticipated med changes

## 2021-06-17 NOTE — Patient Instructions (Signed)
Follow up in 6 months to recheck BP and cholesterol We'll notify you of your lab results and make any changes if needed Keep up the good work!  You look great! We'll call you when your Prolia arrives Call with any questions or concerns Stay Safe!  Stay Healthy! Happy Fall!!

## 2021-06-17 NOTE — Assessment & Plan Note (Signed)
S/p Chemo.  No recent changes.  Pt states meds are ineffective and just wants to 'live with it'

## 2021-06-17 NOTE — Assessment & Plan Note (Signed)
Pt's PE WNL.  UTD on colonoscopy, mammo, DEXA, PNA, COVID, Tdap.  Flu shot given.  Check labs.  Anticipatory guidance provided.

## 2021-06-17 NOTE — Progress Notes (Signed)
Subjective:   Ellen Beltran is a 75 y.o. female who presents for Medicare Annual (Subsequent) preventive examination.  I connected with  Shanon Rosser on 06/17/21 by a telephone enabled telemedicine application and verified that I am speaking with the correct person using two identifiers.   I discussed the limitations of evaluation and management by telemedicine. The patient expressed understanding and agreed to proceed.   Review of Systems     Cardiac Risk Factors include: advanced age (>35men, >30 women);hypertension     Objective:    Today's Vitals   There is no height or weight on file to calculate BMI.  Advanced Directives 06/17/2021 06/11/2020 01/27/2018 01/21/2017 10/22/2016 05/21/2016 03/23/2015  Does Patient Have a Medical Advance Directive? No No No No No No No  Would patient like information on creating a medical advance directive? No - Patient declined Yes (MAU/Ambulatory/Procedural Areas - Information given) No - Patient declined No - Patient declined - - -  Pre-existing out of facility DNR order (yellow form or pink MOST form) - - - - - - -    Current Medications (verified) Outpatient Encounter Medications as of 06/17/2021  Medication Sig   Calcium Citrate-Vitamin D (CALCIUM CITRATE + D3 PO) Take by mouth daily. 400-12.5 - 3 tabs daily   Cholecalciferol (VITAMIN D PO) Take 5,000 mg by mouth daily.   fluticasone (FLONASE) 50 MCG/ACT nasal spray PLACE 1 SPRAY INTO BOTH NOSTRILS DAILY AS NEEDED. CONGESTION   irbesartan (AVAPRO) 75 MG tablet Take 1 tablet by mouth once daily   Multiple Vitamins-Minerals (ALIVE WOMENS 50+ PO) Take 1 each by mouth daily.    PROLIA 60 MG/ML SOSY injection TO BE ADMINISTERED IN PHYSICIAN'S OFFICE. INJECT ONE SYRINGE SUBCUTANEOUSLY ONCE EVERY 6 MONTHS. REFRIGERATE. USE WITHIN 14 DAYS ONCE AT ROOM TEMPERATURE.   simvastatin (ZOCOR) 40 MG tablet TAKE 1 TABLET (40 MG TOTAL) BY MOUTH EVERY EVENING.   cetirizine (ZYRTEC) 10 MG tablet Take 1 tablet (10  mg total) by mouth daily as needed for allergies. (Patient not taking: Reported on 06/17/2021)   No facility-administered encounter medications on file as of 06/17/2021.    Allergies (verified) Codeine, Morphine and related, and Sulfa antibiotics   History: Past Medical History:  Diagnosis Date   Arthritis    Cancer ([place])    left breast   Dental bridge present    left lower   H/O mumps    History of blood transfusion 09/24/2013   History of chemotherapy    stopped 08/2013   History of chicken pox    Hyperlipidemia    Neuropathy due to chemotherapeutic drug ([place])    hands and feet   Osteopenia    Personal history of chemotherapy    2015   Personal history of radiation therapy    2015   Past Surgical History:  Procedure Laterality Date   BREAST LUMPECTOMY Left 10/17/2013   BREAST LUMPECTOMY WITH NEEDLE LOCALIZATION AND AXILLARY SENTINEL LYMPH NODE BX Left 10/17/2013   Procedure: BREAST LUMPECTOMY WITH NEEDLE LOCALIZATION AND AXILLARY SENTINEL LYMPH NODE BX;  Surgeon: Shann Medal, MD;  Location: [facility];  Service: General;  Laterality: Left;   CARDIAC CATHETERIZATION  08/31/2003   no disease   COLONOSCOPY WITH PROPOFOL  06/22/2012   PELVIC FLOOR REPAIR  05/06/2001   cardinal uterosacral colposuspension   PORTACATH PLACEMENT N/A 05/02/2013   Procedure: INSERTION PORT-A-CATH;  Surgeon: Shann Medal, MD;  Location: [facility];  Service: General;  Laterality: N/A;  TOTAL ABDOMINAL HYSTERECTOMY W/ BILATERAL SALPINGOOPHORECTOMY  05/06/2001   TUBAL LIGATION  age 69   Family History  Problem Relation Age of Onset   Heart disease Father    Brain cancer Father    Heart disease Brother    Diabetes Brother    Lung cancer Mother    Thyroid disease Sister    Thyroid disease Sister    Thyroid disease Sister    Cancer Maternal Uncle        pancreatic   Cancer Maternal Uncle        colon   Social History   Socioeconomic History   Marital status: Widowed     Spouse name: Not on file   Number of children: Not on file   Years of education: Not on file   Highest education level: Not on file  Occupational History   Occupation: Retired  Tobacco Use   Smoking status: Never   Smokeless tobacco: Never  Vaping Use   Vaping Use: Never used  Substance and Sexual Activity   Alcohol use: Yes   Drug use: No   Sexual activity: Yes  Other Topics Concern   Not on file  Social History Narrative   Not on file   Social Determinants of Health   Financial Resource Strain: Low Risk    Difficulty of Paying Living Expenses: Not very hard  Food Insecurity: No Food Insecurity   Worried About Charity fundraiser in the Last Year: Never true   Ran Out of Food in the Last Year: Never true  Transportation Needs: No Transportation Needs   Lack of Transportation (Medical): No   Lack of Transportation (Non-Medical): No  Physical Activity: Inactive   Days of Exercise per Week: 0 days   Minutes of Exercise per Session: 0 min  Stress: No Stress Concern Present   Feeling of Stress : Not at all  Social Connections: Moderately Isolated   Frequency of Communication with Friends and Family: More than three times a week   Frequency of Social Gatherings with Friends and Family: More than three times a week   Attends Religious Services: More than 4 times per year   Active Member of [employer] or Organizations: No   Attends Archivist Meetings: Never   Marital Status: Widowed    Tobacco Counseling Counseling given: Not Answered   Clinical Intake:  Pre-visit preparation completed: Yes  Pain : No/denies pain     Nutritional Risks: None Diabetes: No  How often do you need to have someone help you when you read instructions, pamphlets, or other written materials from your doctor or pharmacy?: 1 - Never  Diabetic?no  Interpreter Needed?: No  Information entered by :: Leroy Kennedy LPN   Activities of Daily Living In your present state of health, do  you have any difficulty performing the following activities: 06/17/2021 01/10/2021  Hearing? Y N  Vision? N N  Difficulty concentrating or making decisions? N N  Walking or climbing stairs? N N  Dressing or bathing? N N  Doing errands, shopping? N N  Preparing Food and eating ? N -  Using the Toilet? N -  In the past six months, have you accidently leaked urine? N -  Do you have problems with loss of bowel control? N -  Managing your Medications? N -  Managing your Finances? N -  Housekeeping or managing your Housekeeping? N -  Some recent data might be hidden    Patient Care Team: Annye Asa  E, MD as PCP - General (Family Medicine) Thea Silversmith, MD as Consulting Physician (Radiation Oncology) Nicholas Lose, MD as Consulting Physician (Hematology and Oncology) Alphonsa Overall, MD as Consulting Physician (General Surgery) Madelin Rear, [facility] as Pharmacist (Pharmacist)  Indicate any recent Medical Services you may have received from other than [facility] providers in the past year (date may be approximate).     Assessment:   This is a routine wellness examination for Awilda.  Hearing/Vision screen Hearing Screening - Comments:: Hearing aids both ears Vision Screening - Comments:: Dr. Valetta Close  Up to date  Dietary issues and exercise activities discussed: Current Exercise Habits: The patient does not participate in regular exercise at present   Goals Addressed             This Visit's Progress    Patient Stated       Would like to get more active        Depression Screen PHQ 2/9 Scores 06/17/2021 01/10/2021 06/29/2020 06/11/2020 02/22/2020 12/28/2019 06/10/2019  PHQ - 2 Score 0 0 0 0 0 0 0  PHQ- 9 Score - 0 0 - - 0 0    Fall Risk Fall Risk  06/17/2021 01/10/2021 06/29/2020 06/11/2020 02/22/2020  Falls in the past year? 0 0 0 0 0  Number falls in past yr: 0 0 0 0 0  Injury with Fall? 0 0 0 0 0  Risk for fall due to : - No Fall Risks - - -  Follow up Falls evaluation  completed;Falls prevention discussed - Falls evaluation completed Falls prevention discussed Falls evaluation completed    FALL RISK PREVENTION PERTAINING TO THE HOME:  Any stairs in or around the home? Yes  If so, are there any without handrails? No  Home free of loose throw rugs in walkways, pet beds, electrical cords, etc? Yes  Adequate lighting in your home to reduce risk of falls? Yes   ASSISTIVE DEVICES UTILIZED TO PREVENT FALLS:  Life alert? No  Use of a cane, walker or w/c? No  Grab bars in the bathroom? No  Shower chair or bench in shower? Yes  Elevated toilet seat or a handicapped toilet? No   TIMED UP AND GO:  Was the test performed? No .    Cognitive Function:  Normal cognitive status assessed by direct observation by this Nurse Health Advisor. No abnormalities found.   MMSE - Mini Mental State Exam 01/27/2018  Orientation to time 5  Orientation to Place 5  Registration 3  Attention/ Calculation 5  Recall 3  Language- name 2 objects 2  Language- repeat 1  Language- follow 3 step command 3  Language- read & follow direction 1  Write a sentence 1  Copy design 1  Total score 30        Immunizations Immunization History  Administered Date(s) Administered   Fluad Quad(high Dose 65+) 06/10/2019, 06/29/2020   Influenza Split 06/09/2014   Influenza, High Dose Seasonal PF 06/17/2018   Influenza,inj,Quad PF,6+ Mos 07/29/2013   Influenza-Unspecified 06/04/2015, 05/23/2016, 06/16/2017   PFIZER(Purple Top)SARS-COV-2 Vaccination 11/07/2018, 11/29/2019, 06/21/2020, 04/15/2021   Pneumococcal Conjugate-13 02/27/2014   Pneumococcal Polysaccharide-23 02/06/2015   Tdap 01/02/2016   Zoster Recombinat (Shingrix) 05/15/2017, 08/11/2017   Zoster, Live 03/17/2014    TDAP status: Up to date  Flu Vaccine status: Due, Education has been provided regarding the importance of this vaccine. Advised may receive this vaccine at local pharmacy or Health Dept. Aware to provide a  copy of the vaccination record if  obtained from local pharmacy or Health Dept. Verbalized acceptance and understanding.  Pneumococcal vaccine status: Up to date  Covid-19 vaccine status: Completed vaccines  Qualifies for Shingles Vaccine? No   Zostavax completed Yes   Shingrix Completed?: Yes  Screening Tests Health Maintenance  Topic Date Due   INFLUENZA VACCINE  04/15/2021   COVID-19 Vaccine (5 - Booster for [place] series) 08/15/2021   MAMMOGRAM  10/24/2021   COLONOSCOPY (Pts 45-77yrs Insurance coverage will need to be confirmed)  06/22/2022   TETANUS/TDAP  01/01/2026   DEXA SCAN  Completed   Hepatitis C Screening  Completed   Zoster Vaccines- Shingrix  Completed   HPV VACCINES  Aged Out    Health Maintenance  Health Maintenance Due  Topic Date Due   INFLUENZA VACCINE  04/15/2021    Colorectal cancer screening: Type of screening: Colonoscopy. Completed 2013. Repeat every 10 years  Mammogram status: Completed 2022. Repeat every year  Bone Density status: Completed 2022. Results reflect: Bone density results: OSTEOPOROSIS. Repeat every 2 years.  Lung Cancer Screening: (Low Dose CT Chest recommended if Age 69-80 years, 30 pack-year currently smoking OR have quit w/in 15years.) does not qualify.   Lung Cancer Screening Referral:   Additional Screening:  Hepatitis C Screening: does not qualify; Completed .2015  Vision Screening: Recommended annual ophthalmology exams for early detection of glaucoma and other disorders of the eye. Is the patient up to date with their annual eye exam?  Yes  Who is the provider or what is the name of the office in which the patient attends annual eye exams? Dr. Valetta Close If pt is not established with a provider, would they like to be referred to a provider to establish care? No .   Dental Screening: Recommended annual dental exams for proper oral hygiene  Community Resource Referral / Chronic Care Management: CRR required this visit?  No    CCM required this visit?  No      Plan:     I have personally reviewed and noted the following in the patient's chart:   Medical and social history Use of alcohol, tobacco or illicit drugs  Current medications and supplements including opioid prescriptions.  Functional ability and status Nutritional status Physical activity Advanced directives List of other physicians Hospitalizations, surgeries, and ER visits in previous 12 months Vitals Screenings to include cognitive, depression, and falls Referrals and appointments  In addition, I have reviewed and discussed with patient certain preventive protocols, quality metrics, and best practice recommendations. A written personalized care plan for preventive services as well as general preventive health recommendations were provided to patient.     Leroy Kennedy, LPN   [id number]   Nurse Notes:

## 2021-06-17 NOTE — Patient Instructions (Signed)
[name] , Thank you for taking time to come for your Medicare Wellness Visit. I appreciate your ongoing commitment to your health goals. Please review the following plan we discussed and let me know if I can assist you in the future.   Screening recommendations/referrals: Colonoscopy: up to date Mammogram: up to date Bone Density: up to date Recommended yearly ophthalmology/optometry visit for glaucoma screening and checkup Recommended yearly dental visit for hygiene and checkup  Vaccinations: Influenza vaccine: Education provided Pneumococcal vaccine: up to date Tdap vaccine: up to date Shingles vaccine: up to date    Advanced directives: Education provided  Conditions/risks identified:   Next appointment: 06-17-2021 @ 10:00 Dr. Birdie Riddle   Preventive Care 7 Years and Older, Female Preventive care refers to lifestyle choices and visits with your health care provider that can promote health and wellness. What does preventive care include? A yearly physical exam. This is also called an annual well check. Dental exams once or twice a year. Routine eye exams. Ask your health care provider how often you should have your eyes checked. Personal lifestyle choices, including: Daily care of your teeth and gums. Regular physical activity. Eating a healthy diet. Avoiding tobacco and drug use. Limiting alcohol use. Practicing safe sex. Taking low-dose aspirin every day. Taking vitamin and mineral supplements as recommended by your health care provider. What happens during an annual well check? The services and screenings done by your health care provider during your annual well check will depend on your age, overall health, lifestyle risk factors, and family history of disease. Counseling  Your health care provider may ask you questions about your: Alcohol use. Tobacco use. Drug use. Emotional well-being. Home and relationship well-being. Sexual activity. Eating habits. History  of falls. Memory and ability to understand (cognition). Work and work Statistician. Reproductive health. Screening  You may have the following tests or measurements: Height, weight, and BMI. Blood pressure. Lipid and cholesterol levels. These may be checked every 5 years, or more frequently if you are over 61 years old. Skin check. Lung cancer screening. You may have this screening every year starting at age 31 if you have a 30-pack-year history of smoking and currently smoke or have quit within the past 15 years. Fecal occult blood test (FOBT) of the stool. You may have this test every year starting at age 18. Flexible sigmoidoscopy or colonoscopy. You may have a sigmoidoscopy every 5 years or a colonoscopy every 10 years starting at age 79. Hepatitis C blood test. Hepatitis B blood test. Sexually transmitted disease (STD) testing. Diabetes screening. This is done by checking your blood sugar (glucose) after you have not eaten for a while (fasting). You may have this done every 1-3 years. Bone density scan. This is done to screen for osteoporosis. You may have this done starting at age 32. Mammogram. This may be done every 1-2 years. Talk to your health care provider about how often you should have regular mammograms. Talk with your health care provider about your test results, treatment options, and if necessary, the need for more tests. Vaccines  Your health care provider may recommend certain vaccines, such as: Influenza vaccine. This is recommended every year. Tetanus, diphtheria, and acellular pertussis (Tdap, Td) vaccine. You may need a Td booster every 10 years. Zoster vaccine. You may need this after age 36. Pneumococcal 13-valent conjugate (PCV13) vaccine. One dose is recommended after age 46. Pneumococcal polysaccharide (PPSV23) vaccine. One dose is recommended after age 63. Talk to your health care  provider about which screenings and vaccines you need and how often you need  them. This information is not intended to replace advice given to you by your health care provider. Make sure you discuss any questions you have with your health care provider. Document Released: 09/28/2015 Document Revised: 05/21/2016 Document Reviewed: 07/03/2015 Elsevier Interactive Patient Education  2017 [employer] Prevention in the Home Falls can cause injuries. They can happen to people of all ages. There are many things you can do to make your home safe and to help prevent falls. What can I do on the outside of my home? Regularly fix the edges of walkways and driveways and fix any cracks. Remove anything that might make you trip as you walk through a door, such as a raised step or threshold. Trim any bushes or trees on the path to your home. Use bright outdoor lighting. Clear any walking paths of anything that might make someone trip, such as rocks or tools. Regularly check to see if handrails are loose or broken. Make sure that both sides of any steps have handrails. Any raised decks and porches should have guardrails on the edges. Have any leaves, snow, or ice cleared regularly. Use sand or salt on walking paths during winter. Clean up any spills in your garage right away. This includes oil or grease spills. What can I do in the bathroom? Use night lights. Install grab bars by the toilet and in the tub and shower. Do not use towel bars as grab bars. Use non-skid mats or decals in the tub or shower. If you need to sit down in the shower, use a plastic, non-slip stool. Keep the floor dry. Clean up any water that spills on the floor as soon as it happens. Remove soap buildup in the tub or shower regularly. Attach bath mats securely with double-sided non-slip rug tape. Do not have throw rugs and other things on the floor that can make you trip. What can I do in the bedroom? Use night lights. Make sure that you have a light by your bed that is easy to reach. Do not use  any sheets or blankets that are too big for your bed. They should not hang down onto the floor. Have a firm chair that has side arms. You can use this for support while you get dressed. Do not have throw rugs and other things on the floor that can make you trip. What can I do in the kitchen? Clean up any spills right away. Avoid walking on wet floors. Keep items that you use a lot in easy-to-reach places. If you need to reach something above you, use a strong step stool that has a grab bar. Keep electrical cords out of the way. Do not use floor polish or wax that makes floors slippery. If you must use wax, use non-skid floor wax. Do not have throw rugs and other things on the floor that can make you trip. What can I do with my stairs? Do not leave any items on the stairs. Make sure that there are handrails on both sides of the stairs and use them. Fix handrails that are broken or loose. Make sure that handrails are as long as the stairways. Check any carpeting to make sure that it is firmly attached to the stairs. Fix any carpet that is loose or worn. Avoid having throw rugs at the top or bottom of the stairs. If you do have throw rugs, attach them to the  floor with carpet tape. Make sure that you have a light switch at the top of the stairs and the bottom of the stairs. If you do not have them, ask someone to add them for you. What else can I do to help prevent falls? Wear shoes that: Do not have high heels. Have rubber bottoms. Are comfortable and fit you well. Are closed at the toe. Do not wear sandals. If you use a stepladder: Make sure that it is fully opened. Do not climb a closed stepladder. Make sure that both sides of the stepladder are locked into place. Ask someone to hold it for you, if possible. Clearly mark and make sure that you can see: Any grab bars or handrails. First and last steps. Where the edge of each step is. Use tools that help you move around (mobility aids)  if they are needed. These include: Canes. Walkers. Scooters. Crutches. Turn on the lights when you go into a dark area. Replace any light bulbs as soon as they burn out. Set up your furniture so you have a clear path. Avoid moving your furniture around. If any of your floors are uneven, fix them. If there are any pets around you, be aware of where they are. Review your medicines with your doctor. Some medicines can make you feel dizzy. This can increase your chance of falling. Ask your doctor what other things that you can do to help prevent falls. This information is not intended to replace advice given to you by your health care provider. Make sure you discuss any questions you have with your health care provider. Document Released: 06/28/2009 Document Revised: 02/07/2016 Document Reviewed: 10/06/2014 Elsevier Interactive Patient Education  2017 [employer].

## 2021-06-17 NOTE — Progress Notes (Signed)
   Subjective:    Patient ID: Ellen Beltran, female    DOB: [birth year], 75 y.o.   MRN: [id number]  HPI CPE- UTD on colonoscopy, mammo, DEXA, PNA vaccines, Tdap.  Due for flu.   Patient Care Team    Relationship Specialty Notifications Start End  Midge Minium, MD PCP - General Family Medicine  01/17/16   Thea Silversmith, MD Consulting Physician Radiation Oncology  05/02/13   Nicholas Lose, MD Consulting Physician Hematology and Oncology  01/17/16   Alphonsa Overall, MD Consulting Physician General Surgery  01/27/18   Madelin Rear, [facility] Pharmacist Pharmacist  12/20/19    Comment: phone number [phone]    Health Maintenance  Topic Date Due   INFLUENZA VACCINE  04/15/2021   COVID-19 Vaccine (5 - Booster for [place] series) 08/15/2021   MAMMOGRAM  10/24/2021   COLONOSCOPY (Pts 45-31yrs Insurance coverage will need to be confirmed)  06/22/2022   TETANUS/TDAP  01/01/2026   DEXA SCAN  Completed   Hepatitis C Screening  Completed   Zoster Vaccines- Shingrix  Completed   HPV VACCINES  Aged Out      Review of Systems Patient reports no vision/ hearing changes, adenopathy,fever, weight change,  persistant/recurrent hoarseness , swallowing issues, chest pain, palpitations, edema, persistant/recurrent cough, hemoptysis, dyspnea (rest/exertional/paroxysmal nocturnal), gastrointestinal bleeding (melena, rectal bleeding), abdominal pain, significant heartburn, bowel changes, GU symptoms (dysuria, hematuria, incontinence), Gyn symptoms (abnormal  bleeding, pain),  syncope, focal weakness, memory loss, numbness & tingling, skin/hair/nail changes, abnormal bruising or bleeding, anxiety, or depression.   This visit occurred during the SARS-CoV-2 public health emergency.  Safety protocols were in place, including screening questions prior to the visit, additional usage of staff PPE, and extensive cleaning of exam room while observing appropriate contact time as indicated for disinfecting solutions.       Objective:   Physical Exam        Assessment & Plan:

## 2021-06-17 NOTE — Assessment & Plan Note (Signed)
UTD on DEXA.  Due for Prolia- waiting on pharmacy.  Check Vit D.  Replete prn.

## 2021-06-18 ENCOUNTER — Other Ambulatory Visit: Payer: Self-pay

## 2021-06-18 DIAGNOSIS — M810 Age-related osteoporosis without current pathological fracture: Secondary | ICD-10-CM

## 2021-06-18 NOTE — Progress Notes (Signed)
ita

## 2021-07-10 ENCOUNTER — Ambulatory Visit (INDEPENDENT_AMBULATORY_CARE_PROVIDER_SITE_OTHER): Payer: Medicare HMO | Admitting: Family Medicine

## 2021-07-10 ENCOUNTER — Other Ambulatory Visit: Payer: Self-pay

## 2021-07-10 DIAGNOSIS — M810 Age-related osteoporosis without current pathological fracture: Secondary | ICD-10-CM

## 2021-07-10 MED ORDER — DENOSUMAB 60 MG/ML ~~LOC~~ SOSY
60.0000 mg | PREFILLED_SYRINGE | Freq: Once | SUBCUTANEOUS | Status: AC
Start: 2021-07-10 — End: 2021-07-10
  Administered 2021-07-10: 60 mg via SUBCUTANEOUS

## 2021-07-10 NOTE — Progress Notes (Addendum)
Ellen Beltran is a 75 y.o. female presents to the office today for Prolia injection, per physician's orders. Patient supplied injection   [name]   The above order is mine and pt tolerated injxn w/o difficulty  Annye Asa, MD

## 2021-07-24 ENCOUNTER — Other Ambulatory Visit (INDEPENDENT_AMBULATORY_CARE_PROVIDER_SITE_OTHER): Payer: Medicare HMO

## 2021-07-24 ENCOUNTER — Other Ambulatory Visit: Payer: Self-pay | Admitting: Family Medicine

## 2021-07-24 DIAGNOSIS — M810 Age-related osteoporosis without current pathological fracture: Secondary | ICD-10-CM | POA: Diagnosis not present

## 2021-07-24 LAB — VITAMIN D 25 HYDROXY (VIT D DEFICIENCY, FRACTURES): VITD: 68.11 ng/mL (ref 30.00–100.00)

## 2021-08-05 ENCOUNTER — Telehealth: Payer: Self-pay

## 2021-08-05 NOTE — Telephone Encounter (Signed)
Caller name:Ellen Beltran Cecille Rubin) Daughter   On DPR? :Yes  Call back [phone]  Provider they see: Birdie Riddle   Reason for call:Calling about the coding wrong on injection that was done 10/26 Pt ordered through [facility] and she is being billed for the injection and administer the shot. Holland Falling is sending her a bill because if the coding.

## 2021-08-26 ENCOUNTER — Telehealth: Payer: Self-pay

## 2021-08-26 NOTE — Telephone Encounter (Signed)
Caller name:Tamaria Bertram Savin daughter)   On DPR? :Yes  Call back number:[phone]  Provider they see: Birdie Riddle   Reason for call:Lori is calling back on follow up on coding on previous bill in the system it shows that bill was sent to be corrected but when the patient called nothing is showing per billing. Can someone follow up on this please?

## 2021-08-26 NOTE — Telephone Encounter (Signed)
Error

## 2021-09-10 ENCOUNTER — Other Ambulatory Visit: Payer: Self-pay | Admitting: Family Medicine

## 2021-09-10 DIAGNOSIS — I10 Essential (primary) hypertension: Secondary | ICD-10-CM

## 2021-09-17 ENCOUNTER — Other Ambulatory Visit: Payer: Self-pay | Admitting: Family Medicine

## 2021-09-17 DIAGNOSIS — Z1231 Encounter for screening mammogram for malignant neoplasm of breast: Secondary | ICD-10-CM

## 2021-10-25 ENCOUNTER — Ambulatory Visit
Admission: RE | Admit: 2021-10-25 | Discharge: 2021-10-25 | Disposition: A | Discharge status: Home / Self Care | Payer: Medicare HMO | Source: Ambulatory Visit | Attending: Family Medicine | Admitting: Family Medicine

## 2021-10-25 DIAGNOSIS — Z1231 Encounter for screening mammogram for malignant neoplasm of breast: Secondary | ICD-10-CM

## 2021-10-29 DIAGNOSIS — H2513 Age-related nuclear cataract, bilateral: Secondary | ICD-10-CM | POA: Diagnosis not present

## 2021-10-29 DIAGNOSIS — H524 Presbyopia: Secondary | ICD-10-CM | POA: Diagnosis not present

## 2021-12-10 ENCOUNTER — Other Ambulatory Visit: Payer: Self-pay | Admitting: Family Medicine

## 2021-12-10 DIAGNOSIS — I10 Essential (primary) hypertension: Secondary | ICD-10-CM

## 2021-12-16 ENCOUNTER — Ambulatory Visit (INDEPENDENT_AMBULATORY_CARE_PROVIDER_SITE_OTHER): Payer: Medicare HMO | Admitting: Family Medicine

## 2021-12-16 ENCOUNTER — Encounter: Payer: Self-pay | Admitting: Family Medicine

## 2021-12-16 VITALS — BP 142/74 | HR 111 | Temp 98.9°F | Resp 16 | Wt 105.0 lb

## 2021-12-16 DIAGNOSIS — T452X1A Poisoning by vitamins, accidental (unintentional), initial encounter: Secondary | ICD-10-CM

## 2021-12-16 DIAGNOSIS — J301 Allergic rhinitis due to pollen: Secondary | ICD-10-CM

## 2021-12-16 DIAGNOSIS — I10 Essential (primary) hypertension: Secondary | ICD-10-CM

## 2021-12-16 DIAGNOSIS — E785 Hyperlipidemia, unspecified: Secondary | ICD-10-CM

## 2021-12-16 LAB — CBC WITH DIFFERENTIAL/PLATELET
Basophils Absolute: 0.1 10*3/uL (ref 0.0–0.1)
Basophils Relative: 0.9 % (ref 0.0–3.0)
Eosinophils Absolute: 0.1 10*3/uL (ref 0.0–0.7)
Eosinophils Relative: 1.2 % (ref 0.0–5.0)
HCT: 37.3 % (ref 36.0–46.0)
Hemoglobin: 12.5 g/dL (ref 12.0–15.0)
Lymphocytes Relative: 27.8 % (ref 12.0–46.0)
Lymphs Abs: 2.5 10*3/uL (ref 0.7–4.0)
MCHC: 33.5 g/dL (ref 30.0–36.0)
MCV: 90.3 fl (ref 78.0–100.0)
Monocytes Absolute: 0.9 10*3/uL (ref 0.1–1.0)
Monocytes Relative: 9.4 % (ref 3.0–12.0)
Neutro Abs: 5.5 10*3/uL (ref 1.4–7.7)
Neutrophils Relative %: 60.7 % (ref 43.0–77.0)
Platelets: 374 10*3/uL (ref 150.0–400.0)
RBC: 4.13 Mil/uL (ref 3.87–5.11)
RDW: 14.2 % (ref 11.5–15.5)
WBC: 9.1 10*3/uL (ref 4.0–10.5)

## 2021-12-16 LAB — BASIC METABOLIC PANEL
BUN: 14 mg/dL (ref 6–23)
CO2: 28 mEq/L (ref 19–32)
Calcium: 10.8 mg/dL — ABNORMAL HIGH (ref 8.4–10.5)
Chloride: 101 mEq/L (ref 96–112)
Creatinine, Ser: 0.77 mg/dL (ref 0.40–1.20)
GFR: 76.01 mL/min (ref >60.00)
Glucose, Bld: 88 mg/dL (ref 70–99)
Potassium: 4.1 mEq/L (ref 3.5–5.1)
Sodium: 141 mEq/L (ref 135–145)

## 2021-12-16 LAB — LIPID PANEL
Cholesterol: 251 mg/dL — ABNORMAL HIGH (ref 0–200)
HDL: 77.3 mg/dL (ref >39.00)
LDL Cholesterol: 145 mg/dL — ABNORMAL HIGH (ref 0–99)
NonHDL: 173.79
Total CHOL/HDL Ratio: 3
Triglycerides: 144 mg/dL (ref 0.0–149.0)
VLDL: 28.8 mg/dL (ref 0.0–40.0)

## 2021-12-16 LAB — HEPATIC FUNCTION PANEL
ALT: 26 U/L (ref 0–35)
AST: 25 U/L (ref 0–37)
Albumin: 4.9 g/dL (ref 3.5–5.2)
Alkaline Phosphatase: 77 U/L (ref 39–117)
Bilirubin, Direct: 0.1 mg/dL (ref 0.0–0.3)
Total Bilirubin: 0.6 mg/dL (ref 0.2–1.2)
Total Protein: 7.6 g/dL (ref 6.0–8.3)

## 2021-12-16 LAB — VITAMIN D 25 HYDROXY (VIT D DEFICIENCY, FRACTURES): VITD: 62.2 ng/mL (ref 30.00–100.00)

## 2021-12-16 LAB — TSH: TSH: 3.43 u[IU]/mL (ref 0.35–5.50)

## 2021-12-16 MED ORDER — FLUTICASONE PROPIONATE 50 MCG/ACT NA SUSP: 2.0000 | Freq: Every day | NASAL | 6 refills | Status: DC

## 2021-12-16 MED ORDER — PREDNISONE 10 MG PO TABS: ORAL_TABLET | ORAL | 0 refills | Status: DC

## 2021-12-16 NOTE — Assessment & Plan Note (Signed)
BP is elevated today but pt has completed 2 bottles of Mucinex that contained a decongestant.  I suspect that this is the reason her BP is elevated b/c it has not been high previously.  Will follow. ?

## 2021-12-16 NOTE — Progress Notes (Signed)
? ?  Subjective:  ? ? Patient ID: Ellen Beltran, female    DOB: 05-24-1946, 76 y.o.   MRN: [id number] ? ?HPI ?HTN- chronic problem.  Currently on Irbesartan '75mg'$  daily.  BP is mildly elevated today.  Pt has been taking decongestants recently.  Typically well controlled- last visit was 112/74.  No CP, SOB, HAs, visual changes, edema. ? ?Hyperlipidemia- chronic problem, on Simvastatin '40mg'$  daily.  No abd pain, N/V. ? ?Congestion- pt reports sxs started 'a couple of weeks ago'.  Last week 'was awful'- 'it was in my head, in my eyes'.  No fever.  Some maxillary sinus pressure and pressure behind her eyes.  Taking daily Zyrtec.  Has been taking Mucinex- has finished 2 bottles.  Has not been using Flonase.  Has started nasal rinses every other day- drainage is clear. ? ?Vit D toxicity- pt's Vit D level was high at last visit so she has been off all supplementation since Oct ? ? ?Review of Systems ?For ROS see HPI  ?   ?Objective:  ? Physical Exam ?Vitals reviewed.  ?Constitutional:   ?   General: She is not in acute distress. ?   Appearance: Normal appearance. She is well-developed. She is not ill-appearing.  ?HENT:  ?   Head: Normocephalic and atraumatic.  ?   Right Ear: Tympanic membrane normal.  ?   Left Ear: Tympanic membrane normal.  ?   Nose: Mucosal edema, congestion and rhinorrhea present.  ?   Right Sinus: No maxillary sinus tenderness or frontal sinus tenderness.  ?   Left Sinus: No maxillary sinus tenderness or frontal sinus tenderness.  ?   Mouth/Throat:  ?   Mouth: Mucous membranes are moist.  ?   Pharynx: Posterior oropharyngeal erythema (w/ PND) present.  ?Eyes:  ?   Conjunctiva/sclera: Conjunctivae normal.  ?   Pupils: Pupils are equal, round, and reactive to light.  ?Cardiovascular:  ?   Rate and Rhythm: Normal rate and regular rhythm.  ?   Pulses: Normal pulses.  ?   Heart sounds: Normal heart sounds.  ?Pulmonary:  ?   Effort: Pulmonary effort is normal. No respiratory distress.  ?   Breath sounds:  Normal breath sounds. No wheezing or rales.  ?Abdominal:  ?   General: There is no distension.  ?   Palpations: Abdomen is soft.  ?   Tenderness: There is no abdominal tenderness.  ?Musculoskeletal:  ?   Cervical back: Normal range of motion and neck supple.  ?   Right lower leg: No edema.  ?   Left lower leg: No edema.  ?Lymphadenopathy:  ?   Cervical: No cervical adenopathy.  ?Skin: ?   General: Skin is warm and dry.  ?Neurological:  ?   General: No focal deficit present.  ?   Mental Status: She is alert and oriented to person, place, and time.  ?Psychiatric:     ?   Mood and Affect: Mood normal.     ?   Behavior: Behavior normal.     ?   Thought Content: Thought content normal.  ? ? ? ? ? ?   ?Assessment & Plan:  ? ?Vit D toxicity- new.  Noted at last visit.  She has been off all vitamin supplementation.  Repeat labs today and determine if we need to restart Vit D. ?

## 2021-12-16 NOTE — Assessment & Plan Note (Signed)
Deteriorated.  Pt doesn't have signs or symptoms of infxn so no need for abx.  Will restart Flonase and do a prednisone taper to improve inflammation.  Continue Zyrtec.  Reviewed supportive care and red flags that should prompt return.  Pt expressed understanding and is in agreement w/ plan.  ?

## 2021-12-16 NOTE — Patient Instructions (Addendum)
Schedule your complete physical in 6 months ?We'll notify you of your lab results and make any changes if needed ?START the Prednisone as directed- take w/ food- for the allergic inflammation ?RESTART the Flonase daily- 2 sprays each nostril ?Drink LOTS of fluids to rinse the drainage off the back of your throat ?Call with any questions or concerns ?Hang in there!!! ?

## 2021-12-16 NOTE — Assessment & Plan Note (Signed)
Chronic problem.  Tolerating Simvastatin 40mg daily w/o difficulty.  Check labs.  Adjust meds prn  

## 2021-12-17 ENCOUNTER — Telehealth: Payer: Self-pay

## 2021-12-17 NOTE — Telephone Encounter (Signed)
-----   Message from Midge Minium, MD sent at 12/17/2021  7:30 AM EDT ----- ?Vit D level looks great!  No need for supplements at this time ? ?Total cholesterol and LDL (bad cholesterol) are both higher than last check but the ratio of good to bad cholesterol remains excellent.  No medication changes at this time but continue to work on healthy diet and regular physical activity. ? ?Calcium is just slightly elevated.  No cause for concern and we will repeat this at your next visit. ? ?Remainder of labs look great! ?

## 2021-12-17 NOTE — Telephone Encounter (Signed)
LVM for patient at her home number to return call in regards to lab results ?

## 2021-12-18 NOTE — Telephone Encounter (Signed)
Patient returned call and is aware of labs. She stated that she wanted to talk to Dr Birdie Riddle about her cholesterol medication because she has been on that med for about 15 years and it doesn't seem to help keep it stable.  ?

## 2021-12-18 NOTE — Telephone Encounter (Signed)
We can either wait until her next visit or she can schedule an appt if she wants to discuss sooner ?

## 2021-12-18 NOTE — Telephone Encounter (Signed)
Wants to discuss at appt. Tabori aware ?

## 2022-01-03 ENCOUNTER — Ambulatory Visit (INDEPENDENT_AMBULATORY_CARE_PROVIDER_SITE_OTHER): Payer: Medicare HMO | Admitting: Family Medicine

## 2022-01-03 DIAGNOSIS — M810 Age-related osteoporosis without current pathological fracture: Secondary | ICD-10-CM

## 2022-01-03 MED ORDER — DENOSUMAB 60 MG/ML ~~LOC~~ SOSY
60.0000 mg | PREFILLED_SYRINGE | Freq: Once | SUBCUTANEOUS | Status: AC
  Administered 2022-01-03: 60 mg via SUBCUTANEOUS

## 2022-01-03 NOTE — Progress Notes (Signed)
Pt received injection without complication, pt does note billing issues in the past where patient supplied has not been selected and wants to ensure this doe not become issue in the future as pt pharmacy delivers medication here  ?

## 2022-03-05 ENCOUNTER — Other Ambulatory Visit: Payer: Self-pay | Admitting: Family Medicine

## 2022-03-05 DIAGNOSIS — I10 Essential (primary) hypertension: Secondary | ICD-10-CM

## 2022-04-23 ENCOUNTER — Other Ambulatory Visit: Payer: Self-pay | Admitting: Family Medicine

## 2022-06-02 ENCOUNTER — Other Ambulatory Visit: Payer: Self-pay | Admitting: Family Medicine

## 2022-06-09 ENCOUNTER — Other Ambulatory Visit: Payer: Self-pay | Admitting: Family Medicine

## 2022-06-09 DIAGNOSIS — I10 Essential (primary) hypertension: Secondary | ICD-10-CM

## 2022-06-17 DIAGNOSIS — R079 Chest pain, unspecified: Secondary | ICD-10-CM | POA: Diagnosis not present

## 2022-06-17 DIAGNOSIS — M79602 Pain in left arm: Secondary | ICD-10-CM | POA: Diagnosis not present

## 2022-06-17 DIAGNOSIS — D72829 Elevated white blood cell count, unspecified: Secondary | ICD-10-CM | POA: Diagnosis not present

## 2022-06-17 DIAGNOSIS — I10 Essential (primary) hypertension: Secondary | ICD-10-CM | POA: Diagnosis not present

## 2022-06-18 DIAGNOSIS — R079 Chest pain, unspecified: Secondary | ICD-10-CM | POA: Diagnosis not present

## 2022-06-18 DIAGNOSIS — M79622 Pain in left upper arm: Secondary | ICD-10-CM | POA: Diagnosis not present

## 2022-06-23 ENCOUNTER — Telehealth: Payer: Self-pay | Admitting: Family Medicine

## 2022-06-23 NOTE — Telephone Encounter (Signed)
Spoke with daughter Cecille Rubin) advised that proleria shot should be sent today.

## 2022-06-23 NOTE — Telephone Encounter (Signed)
They are suppose to send them today

## 2022-06-23 NOTE — Telephone Encounter (Signed)
They are suppose to be sending them today

## 2022-06-23 NOTE — Telephone Encounter (Signed)
Caller name: Pt's daughter Cecille Rubin  On [place]? :yes/no: No  Call back number: [phone]  Provider they see: Birdie Riddle  Reason for call: calling to check and see if proleria shot has arrived for pt; has an appointment 06/25/22 for physical and wanted to get all done at once.

## 2022-06-25 ENCOUNTER — Ambulatory Visit (INDEPENDENT_AMBULATORY_CARE_PROVIDER_SITE_OTHER): Payer: Medicare HMO | Admitting: Family Medicine

## 2022-06-25 ENCOUNTER — Encounter: Payer: Self-pay | Admitting: Family Medicine

## 2022-06-25 ENCOUNTER — Telehealth: Payer: Self-pay | Admitting: Internal Medicine

## 2022-06-25 VITALS — BP 120/60 | HR 78 | Temp 97.9°F | Resp 16 | Ht <= 58 in | Wt 106.0 lb

## 2022-06-25 DIAGNOSIS — M810 Age-related osteoporosis without current pathological fracture: Secondary | ICD-10-CM

## 2022-06-25 DIAGNOSIS — Z1211 Encounter for screening for malignant neoplasm of colon: Secondary | ICD-10-CM

## 2022-06-25 DIAGNOSIS — Z0000 Encounter for general adult medical examination without abnormal findings: Secondary | ICD-10-CM

## 2022-06-25 DIAGNOSIS — I10 Essential (primary) hypertension: Secondary | ICD-10-CM

## 2022-06-25 DIAGNOSIS — Z23 Encounter for immunization: Secondary | ICD-10-CM | POA: Diagnosis not present

## 2022-06-25 LAB — CBC WITH DIFFERENTIAL/PLATELET
Basophils Absolute: 0.1 10*3/uL (ref 0.0–0.1)
Basophils Relative: 0.8 % (ref 0.0–3.0)
Eosinophils Absolute: 0.2 10*3/uL (ref 0.0–0.7)
Eosinophils Relative: 2.3 % (ref 0.0–5.0)
HCT: 35.3 % — ABNORMAL LOW (ref 36.0–46.0)
Hemoglobin: 11.8 g/dL — ABNORMAL LOW (ref 12.0–15.0)
Lymphocytes Relative: 36.2 % (ref 12.0–46.0)
Lymphs Abs: 2.8 10*3/uL (ref 0.7–4.0)
MCHC: 33.5 g/dL (ref 30.0–36.0)
MCV: 89.2 fl (ref 78.0–100.0)
Monocytes Absolute: 0.9 10*3/uL (ref 0.1–1.0)
Monocytes Relative: 12.1 % — ABNORMAL HIGH (ref 3.0–12.0)
Neutro Abs: 3.7 10*3/uL (ref 1.4–7.7)
Neutrophils Relative %: 48.6 % (ref 43.0–77.0)
Platelets: 341 10*3/uL (ref 150.0–400.0)
RBC: 3.96 Mil/uL (ref 3.87–5.11)
RDW: 13.7 % (ref 11.5–15.5)
WBC: 7.6 10*3/uL (ref 4.0–10.5)

## 2022-06-25 LAB — HEPATIC FUNCTION PANEL
ALT: 21 U/L (ref 0–35)
AST: 22 U/L (ref 0–37)
Albumin: 4.3 g/dL (ref 3.5–5.2)
Alkaline Phosphatase: 69 U/L (ref 39–117)
Bilirubin, Direct: 0 mg/dL (ref 0.0–0.3)
Total Bilirubin: 0.4 mg/dL (ref 0.2–1.2)
Total Protein: 7.2 g/dL (ref 6.0–8.3)

## 2022-06-25 LAB — LIPID PANEL
Cholesterol: 203 mg/dL — ABNORMAL HIGH (ref 0–200)
HDL: 72.3 mg/dL (ref >39.00)
LDL Cholesterol: 112 mg/dL — ABNORMAL HIGH (ref 0–99)
NonHDL: 130.97
Total CHOL/HDL Ratio: 3
Triglycerides: 95 mg/dL (ref 0.0–149.0)
VLDL: 19 mg/dL (ref 0.0–40.0)

## 2022-06-25 LAB — BASIC METABOLIC PANEL
BUN: 19 mg/dL (ref 6–23)
CO2: 30 mEq/L (ref 19–32)
Calcium: 10.1 mg/dL (ref 8.4–10.5)
Chloride: 102 mEq/L (ref 96–112)
Creatinine, Ser: 0.94 mg/dL (ref 0.40–1.20)
GFR: 59.61 mL/min — ABNORMAL LOW (ref >60.00)
Glucose, Bld: 91 mg/dL (ref 70–99)
Potassium: 4.6 mEq/L (ref 3.5–5.1)
Sodium: 140 mEq/L (ref 135–145)

## 2022-06-25 LAB — TSH: TSH: 6.83 u[IU]/mL — ABNORMAL HIGH (ref 0.35–5.50)

## 2022-06-25 LAB — VITAMIN D 25 HYDROXY (VIT D DEFICIENCY, FRACTURES): VITD: 64.78 ng/mL (ref 30.00–100.00)

## 2022-06-25 MED ORDER — DENOSUMAB 60 MG/ML ~~LOC~~ SOSY
60.0000 mg | PREFILLED_SYRINGE | Freq: Once | SUBCUTANEOUS | Status: AC
  Administered 2022-06-25: 60 mg via SUBCUTANEOUS

## 2022-06-25 NOTE — Assessment & Plan Note (Signed)
UTD on DEXA.  Prolia given today.  Check Vit D level and replete prn.

## 2022-06-25 NOTE — Assessment & Plan Note (Signed)
Chronic problem.  Excellent control.  Asymptomatic.  Check labs due to ARB but no anticipated med changes.  Will follow.

## 2022-06-25 NOTE — Patient Instructions (Addendum)
Follow up in 6 months to recheck BP and cholesterol We'll notify you of your lab results and make any changes if needed We'll call you to schedule your GI consultation for the colonoscopy You can get the COVID Booster at your convenience Keep up the good work!  You look great! Call with any questions or concerns Stay Safe!  Stay Healthy! Happy Fall!!

## 2022-06-25 NOTE — Assessment & Plan Note (Signed)
Pt's PE WNL.  UTD on mammo, Tdap, PNA.  Flu shot given today.  Referral for colonoscopy placed.  Check labs.  Anticipatory guidance provided.

## 2022-06-25 NOTE — Telephone Encounter (Signed)
Dr. Tarri Glenn are you okay with taking on patient?

## 2022-06-25 NOTE — Telephone Encounter (Signed)
No problem.

## 2022-06-25 NOTE — Telephone Encounter (Signed)
Good Afternoon Dr. Henrene Pastor,   Patients daughter called stating that patient was needing to schedule a colonoscopy. The Patient has a referral in to be seen by Dr. Tarri Glenn but is a patient of yours. Patient is wanting to transfer her care over to Dr. Tarri Glenn are you okay with Dr. Tarri Glenn taking on care for patient?  Please advise, thank you!

## 2022-06-25 NOTE — Progress Notes (Signed)
   Subjective:    Patient ID: Ellen Beltran, female    DOB: 04/26/1946, 76 y.o.   MRN: [id number]  HPI CPE- UTD on mammo, Tdap, PNA, DEXA.  Due for colonoscopy, flu  Patient Care Team    Relationship Specialty Notifications Start End  Midge Minium, MD PCP - General Family Medicine  01/17/16   Thea Silversmith, MD Consulting Physician Radiation Oncology  05/02/13   Nicholas Lose, MD Consulting Physician Hematology and Oncology  01/17/16   Alphonsa Overall, MD Consulting Physician General Surgery  01/27/18   Madelin Rear, [facility] (Inactive) Pharmacist Pharmacist  12/20/19    Comment: phone number [phone]    Health Maintenance  Topic Date Due   INFLUENZA VACCINE  04/15/2022   COLONOSCOPY (Pts 45-92yr Insurance coverage will need to be confirmed)  06/22/2022   MAMMOGRAM  10/25/2022   TETANUS/TDAP  01/01/2026   Pneumonia Vaccine 75 Years old  Completed   DEXA SCAN  Completed   Hepatitis C Screening  Completed   Zoster Vaccines- Shingrix  Completed   HPV VACCINES  Aged Out   COVID-19 Vaccine  Discontinued      Review of Systems Patient reports no vision/ hearing changes, adenopathy,fever, weight change,  persistant/recurrent hoarseness , swallowing issues, chest pain, palpitations, edema, persistant/recurrent cough, hemoptysis, dyspnea (rest/exertional/paroxysmal nocturnal), gastrointestinal bleeding (melena, rectal bleeding), abdominal pain, significant heartburn, bowel changes, GU symptoms (dysuria, hematuria, incontinence), Gyn symptoms (abnormal  bleeding, pain),  syncope, focal weakness, memory loss, numbness & tingling, skin/hair/nail changes, abnormal bruising or bleeding, anxiety, or depression.     Objective:   Physical Exam General Appearance:    Alert, cooperative, no distress, appears stated age  Head:    Normocephalic, without obvious abnormality, atraumatic  Eyes:    PERRL, conjunctiva/corneas clear, EOM's intact both eyes  Ears:    Normal TM's and external ear canals,  both ears  Nose:   Nares normal, septum midline, mucosa normal, no drainage    or sinus tenderness  Throat:   Lips, mucosa, and tongue normal; teeth and gums normal  Neck:   Supple, symmetrical, trachea midline, no adenopathy;    Thyroid: no enlargement/tenderness/nodules  Back:     Symmetric, no curvature, ROM normal, no CVA tenderness  Lungs:     Clear to auscultation bilaterally, respirations unlabored  Chest Wall:    No tenderness or deformity   Heart:    Regular rate and rhythm, S1 and S2 normal, no murmur, rub   or gallop  Breast Exam:    Deferred to mammo  Abdomen:     Soft, non-tender, bowel sounds active all four quadrants,    no masses, no organomegaly  Genitalia:    Deferred  Rectal:    Extremities:   Extremities normal, atraumatic, no cyanosis or edema  Pulses:   2+ and symmetric all extremities  Skin:   Skin color, texture, turgor normal, no rashes or lesions  Lymph nodes:   Cervical, supraclavicular, and axillary nodes normal  Neurologic:   CNII-XII intact, normal strength, sensation and reflexes    throughout          Assessment & Plan:

## 2022-06-26 ENCOUNTER — Other Ambulatory Visit: Payer: Self-pay

## 2022-06-26 ENCOUNTER — Other Ambulatory Visit (INDEPENDENT_AMBULATORY_CARE_PROVIDER_SITE_OTHER): Payer: Medicare HMO

## 2022-06-26 ENCOUNTER — Telehealth: Payer: Self-pay

## 2022-06-26 DIAGNOSIS — R7989 Other specified abnormal findings of blood chemistry: Secondary | ICD-10-CM | POA: Diagnosis not present

## 2022-06-26 LAB — T4, FREE: Free T4: 0.82 ng/dL (ref 0.60–1.60)

## 2022-06-26 LAB — T3, FREE: T3, Free: 3.9 pg/mL (ref 2.3–4.2)

## 2022-06-26 NOTE — Telephone Encounter (Signed)
Called patient to discuss lab results, no answer and VM not set up

## 2022-06-26 NOTE — Progress Notes (Signed)
Pt seen results Via my chart Add on  labs have been ordered

## 2022-06-26 NOTE — Telephone Encounter (Signed)
-----   Message from Midge Minium, MD sent at 06/26/2022 12:49 PM EDT ----- Your free T3 and free T4 are both normal.  This is great news and means we don't need to start medication at this time

## 2022-06-27 ENCOUNTER — Encounter: Payer: Self-pay | Admitting: Gastroenterology

## 2022-06-27 NOTE — Telephone Encounter (Signed)
Patient has been schedule for colonoscopy with Dr. Tarri Glenn on 121 at 8:30 and PV on 11/9 at 1:00

## 2022-06-27 NOTE — Telephone Encounter (Signed)
Informed pt that the lab results where normal

## 2022-07-08 ENCOUNTER — Telehealth: Payer: Self-pay | Admitting: Family Medicine

## 2022-07-08 NOTE — Telephone Encounter (Signed)
Left message for patient to call back and schedule Medicare Annual Wellness Visit (AWV).   Please offer to do virtually or by telephone.  Left office number and my jabber [phone].  Last AWV:06/17/2021  Please schedule at anytime with Nurse Health Advisor.

## 2022-07-31 ENCOUNTER — Ambulatory Visit (INDEPENDENT_AMBULATORY_CARE_PROVIDER_SITE_OTHER): Payer: Medicare HMO | Admitting: *Deleted

## 2022-07-31 DIAGNOSIS — Z0000 Encounter for general adult medical examination without abnormal findings: Secondary | ICD-10-CM

## 2022-07-31 NOTE — Patient Instructions (Signed)
Ellen Beltran , Thank you for taking time to come for your Medicare Wellness Visit. I appreciate your ongoing commitment to your health goals. Please review the following plan we discussed and let me know if I can assist you in the future.   These are the goals we discussed:  Goals       maintain (pt-stated)      Maintain current health by continuing to eat healthy and being active.       Patient Stated      Would like to get more active       PharmD Care Plan      CARE PLAN ENTRY  Current Barriers:  Chronic Disease Management support, education, and care coordination needs related to  high cholesterol, high blood pressure, and osteoporosis.   Pharmacist Clinical Goal(s):  Over next 6 months: Total Cholesterol <200, ensure affordability of osteoporosis medications, blood pressure < 140/90.  Interventions: Comprehensive medication review performed. Discussed concerns related to osteoporosis medication costs. Discussed diet recommendations for lowering cholesterol.  Patient Self Care Activities:  Patient verbalizes understanding of plan to call pharmacist with any questions. Over next six months, please try to focus on oats, barley, grains for breakfast foods.   Initial goal documentation.        This is a list of the screening recommended for you and due dates:  Health Maintenance  Topic Date Due   Colon Cancer Screening  06/22/2022   Mammogram  10/25/2022   Medicare Annual Wellness Visit  08/01/2023   Tetanus Vaccine  01/01/2026   Pneumonia Vaccine  Completed   Flu Shot  Completed   DEXA scan (bone density measurement)  Completed   Hepatitis C Screening: USPSTF Recommendation to screen - Ages 78-79 yo.  Completed   Zoster (Shingles) Vaccine  Completed   HPV Vaccine  Aged Out   COVID-19 Vaccine  Discontinued    Advanced directives: yes not on file  Conditions/risks identified:   Next appointment: Follow up in one year for your annual wellness visit 12-25-2022 @  9:00  [place]   Preventive Care 60 Years and Older, Female Preventive care refers to lifestyle choices and visits with your health care provider that can promote health and wellness. What does preventive care include? A yearly physical exam. This is also called an annual well check. Dental exams once or twice a year. Routine eye exams. Ask your health care provider how often you should have your eyes checked. Personal lifestyle choices, including: Daily care of your teeth and gums. Regular physical activity. Eating a healthy diet. Avoiding tobacco and drug use. Limiting alcohol use. Practicing safe sex. Taking low-dose aspirin every day. Taking vitamin and mineral supplements as recommended by your health care provider. What happens during an annual well check? The services and screenings done by your health care provider during your annual well check will depend on your age, overall health, lifestyle risk factors, and family history of disease. Counseling  Your health care provider may ask you questions about your: Alcohol use. Tobacco use. Drug use. Emotional well-being. Home and relationship well-being. Sexual activity. Eating habits. History of falls. Memory and ability to understand (cognition). Work and work Statistician. Reproductive health. Screening  You may have the following tests or measurements: Height, weight, and BMI. Blood pressure. Lipid and cholesterol levels. These may be checked every 5 years, or more frequently if you are over 72 years old. Skin check. Lung cancer screening. You may have this screening every year starting at  age 33 if you have a 30-pack-year history of smoking and currently smoke or have quit within the past 15 years. Fecal occult blood test (FOBT) of the stool. You may have this test every year starting at age 50. Flexible sigmoidoscopy or colonoscopy. You may have a sigmoidoscopy every 5 years or a colonoscopy every 10 years starting at  age 59. Hepatitis C blood test. Hepatitis B blood test. Sexually transmitted disease (STD) testing. Diabetes screening. This is done by checking your blood sugar (glucose) after you have not eaten for a while (fasting). You may have this done every 1-3 years. Bone density scan. This is done to screen for osteoporosis. You may have this done starting at age 30. Mammogram. This may be done every 1-2 years. Talk to your health care provider about how often you should have regular mammograms. Talk with your health care provider about your test results, treatment options, and if necessary, the need for more tests. Vaccines  Your health care provider may recommend certain vaccines, such as: Influenza vaccine. This is recommended every year. Tetanus, diphtheria, and acellular pertussis (Tdap, Td) vaccine. You may need a Td booster every 10 years. Zoster vaccine. You may need this after age 66. Pneumococcal 13-valent conjugate (PCV13) vaccine. One dose is recommended after age 45. Pneumococcal polysaccharide (PPSV23) vaccine. One dose is recommended after age 77. Talk to your health care provider about which screenings and vaccines you need and how often you need them. This information is not intended to replace advice given to you by your health care provider. Make sure you discuss any questions you have with your health care provider. Document Released: 09/28/2015 Document Revised: 05/21/2016 Document Reviewed: 07/03/2015 Elsevier Interactive Patient Education  2017 [place] Prevention in the Home Falls can cause injuries. They can happen to people of all ages. There are many things you can do to make your home safe and to help prevent falls. What can I do on the outside of my home? Regularly fix the edges of walkways and driveways and fix any cracks. Remove anything that might make you trip as you walk through a door, such as a raised step or threshold. Trim any bushes or trees on the  path to your home. Use bright outdoor lighting. Clear any walking paths of anything that might make someone trip, such as rocks or tools. Regularly check to see if handrails are loose or broken. Make sure that both sides of any steps have handrails. Any raised decks and porches should have guardrails on the edges. Have any leaves, snow, or ice cleared regularly. Use sand or salt on walking paths during winter. Clean up any spills in your garage right away. This includes oil or grease spills. What can I do in the bathroom? Use night lights. Install grab bars by the toilet and in the tub and shower. Do not use towel bars as grab bars. Use non-skid mats or decals in the tub or shower. If you need to sit down in the shower, use a plastic, non-slip stool. Keep the floor dry. Clean up any water that spills on the floor as soon as it happens. Remove soap buildup in the tub or shower regularly. Attach bath mats securely with double-sided non-slip rug tape. Do not have throw rugs and other things on the floor that can make you trip. What can I do in the bedroom? Use night lights. Make sure that you have a light by your bed that is easy to  reach. Do not use any sheets or blankets that are too big for your bed. They should not hang down onto the floor. Have a firm chair that has side arms. You can use this for support while you get dressed. Do not have throw rugs and other things on the floor that can make you trip. What can I do in the kitchen? Clean up any spills right away. Avoid walking on wet floors. Keep items that you use a lot in easy-to-reach places. If you need to reach something above you, use a strong step stool that has a grab bar. Keep electrical cords out of the way. Do not use floor polish or wax that makes floors slippery. If you must use wax, use non-skid floor wax. Do not have throw rugs and other things on the floor that can make you trip. What can I do with my stairs? Do not  leave any items on the stairs. Make sure that there are handrails on both sides of the stairs and use them. Fix handrails that are broken or loose. Make sure that handrails are as long as the stairways. Check any carpeting to make sure that it is firmly attached to the stairs. Fix any carpet that is loose or worn. Avoid having throw rugs at the top or bottom of the stairs. If you do have throw rugs, attach them to the floor with carpet tape. Make sure that you have a light switch at the top of the stairs and the bottom of the stairs. If you do not have them, ask someone to add them for you. What else can I do to help prevent falls? Wear shoes that: Do not have high heels. Have rubber bottoms. Are comfortable and fit you well. Are closed at the toe. Do not wear sandals. If you use a stepladder: Make sure that it is fully opened. Do not climb a closed stepladder. Make sure that both sides of the stepladder are locked into place. Ask someone to hold it for you, if possible. Clearly mark and make sure that you can see: Any grab bars or handrails. First and last steps. Where the edge of each step is. Use tools that help you move around (mobility aids) if they are needed. These include: Canes. Walkers. Scooters. Crutches. Turn on the lights when you go into a dark area. Replace any light bulbs as soon as they burn out. Set up your furniture so you have a clear path. Avoid moving your furniture around. If any of your floors are uneven, fix them. If there are any pets around you, be aware of where they are. Review your medicines with your doctor. Some medicines can make you feel dizzy. This can increase your chance of falling. Ask your doctor what other things that you can do to help prevent falls. This information is not intended to replace advice given to you by your health care provider. Make sure you discuss any questions you have with your health care provider. Document Released:  06/28/2009 Document Revised: 02/07/2016 Document Reviewed: 10/06/2014 Elsevier Interactive Patient Education  2017 [employer].

## 2022-07-31 NOTE — Progress Notes (Signed)
Subjective:   Ellen Beltran is a 76 y.o. female who presents for Medicare Annual (Subsequent) preventive examination.  I connected with  Shanon Rosser on 07/31/22 by a telephone enabled telemedicine application and verified that I am speaking with the correct person using two identifiers.   I discussed the limitations of evaluation and management by telemedicine. The patient expressed understanding and agreed to proceed.  Patient location: home  Provider location: Tele-health-home       Objective:    Today's Vitals   There is no height or weight on file to calculate BMI.     07/31/2022   10:38 AM 06/17/2021    8:37 AM 06/11/2020   11:23 AM 01/27/2018    9:18 AM 01/21/2017    8:25 AM 10/22/2016    9:47 AM 05/21/2016    8:46 AM  Advanced Directives  Does Patient Have a Medical Advance Directive? Yes No No No No No No  Type of Advance Directive [place] in Chart? No - copy requested        Would patient like information on creating a medical advance directive?  No - Patient declined Yes (MAU/Ambulatory/Procedural Areas - Information given) No - Patient declined No - Patient declined      Current Medications (verified) Outpatient Encounter Medications as of 07/31/2022  Medication Sig   cetirizine (ZYRTEC) 10 MG tablet Take 1 tablet (10 mg total) by mouth daily as needed for allergies.   fluticasone (FLONASE) 50 MCG/ACT nasal spray Place 2 sprays into both nostrils daily.   irbesartan (AVAPRO) 75 MG tablet Take 1 tablet by mouth once daily   Multiple Vitamins-Minerals (ALIVE WOMENS 50+ PO) Take 1 each by mouth daily.    PROLIA 60 MG/ML SOSY injection TO BE ADMINISTERED IN PHYSICIAN'S OFFICE. INJECT ONE SYRINGE SUBCUTANEOUSLY ONCE EVERY 6 MONTHS. REFRIGERATE. USE WITHIN 14 DAYS ONCE AT ROOM TEMPERATURE.   simvastatin (ZOCOR) 40 MG tablet TAKE 1 TABLET BY MOUTH EVERY DAY IN THE EVENING   No facility-administered  encounter medications on file as of 07/31/2022.    Allergies (verified) Codeine, Morphine and related, and Sulfa antibiotics   History: Past Medical History:  Diagnosis Date   Arthritis    Cancer ([place])    left breast   Dental bridge present    left lower   H/O mumps    History of blood transfusion 09/24/2013   History of chemotherapy    stopped 08/2013   History of chicken pox    Hyperlipidemia    Neuropathy due to chemotherapeutic drug ([place])    hands and feet   Osteopenia    Personal history of chemotherapy    2015   Personal history of radiation therapy    2015   Past Surgical History:  Procedure Laterality Date   BREAST LUMPECTOMY Left 10/17/2013   BREAST LUMPECTOMY WITH NEEDLE LOCALIZATION AND AXILLARY SENTINEL LYMPH NODE BX Left 10/17/2013   Procedure: BREAST LUMPECTOMY WITH NEEDLE LOCALIZATION AND AXILLARY SENTINEL LYMPH NODE BX;  Surgeon: Shann Medal, MD;  Location: [facility];  Service: General;  Laterality: Left;   CARDIAC CATHETERIZATION  08/31/2003   no disease   COLONOSCOPY WITH PROPOFOL  06/22/2012   PELVIC FLOOR REPAIR  05/06/2001   cardinal uterosacral colposuspension   PORTACATH PLACEMENT N/A 05/02/2013   Procedure: INSERTION PORT-A-CATH;  Surgeon: Shann Medal, MD;  Location: [facility];  Service: General;  Laterality: N/A;   TOTAL ABDOMINAL HYSTERECTOMY W/ BILATERAL SALPINGOOPHORECTOMY  05/06/2001   TUBAL LIGATION  age 57   Family History  Problem Relation Age of Onset   Heart disease Father    Brain cancer Father    Heart disease Brother    Diabetes Brother    Lung cancer Mother    Thyroid disease Sister    Thyroid disease Sister    Thyroid disease Sister    Cancer Maternal Uncle        pancreatic   Cancer Maternal Uncle        colon   Social History   Socioeconomic History   Marital status: Widowed    Spouse name: Not on file   Number of children: Not on file   Years of education: Not on file   Highest education level: Not  on file  Occupational History   Occupation: Retired  Tobacco Use   Smoking status: Never   Smokeless tobacco: Never  Vaping Use   Vaping Use: Never used  Substance and Sexual Activity   Alcohol use: Yes   Drug use: No   Sexual activity: Yes  Other Topics Concern   Not on file  Social History Narrative   Not on file   Social Determinants of Health   Financial Resource Strain: Low Risk  (07/31/2022)   Overall Financial Resource Strain (CARDIA)    Difficulty of Paying Living Expenses: Not hard at all  Food Insecurity: No Food Insecurity (07/31/2022)   Hunger Vital Sign    Worried About Running Out of Food in the Last Year: Never true    [place] in the Last Year: Never true  Transportation Needs: No Transportation Needs (07/31/2022)   PRAPARE - Hydrologist (Medical): No    Lack of Transportation (Non-Medical): No  Physical Activity: Inactive (07/31/2022)   Exercise Vital Sign    Days of Exercise per Week: 0 days    Minutes of Exercise per Session: 0 min  Stress: No Stress Concern Present (07/31/2022)   [place]    Feeling of Stress : Not at all  Social Connections: Moderately Isolated (07/31/2022)   Social Connection and Isolation Panel [NHANES]    Frequency of Communication with Friends and Family: More than three times a week    Frequency of Social Gatherings with Friends and Family: Twice a week    Attends Religious Services: More than 4 times per year    Active Member of [employer] or Organizations: No    Attends Archivist Meetings: Never    Marital Status: Widowed    Tobacco Counseling Counseling given: Not Answered   Clinical Intake:  Pre-visit preparation completed: Yes  Pain : No/denies pain     Diabetes: No  How often do you need to have someone help you when you read instructions, pamphlets, or other written materials from your doctor or  pharmacy?: 1 - Never  Diabetic?  no  Interpreter Needed?: No  Information entered by :: Leroy Kennedy LPN   Activities of Daily Living    07/31/2022   10:42 AM 06/25/2022    8:40 AM  In your present state of health, do you have any difficulty performing the following activities:  Hearing? 0 0  Vision? 0 0  Difficulty concentrating or making decisions? 0 0  Walking or climbing stairs? 0 0  Dressing or bathing? 0 0  Doing errands, shopping? 0  0  Preparing Food and eating ? N   Using the Toilet? N   In the past six months, have you accidently leaked urine? N   Do you have problems with loss of bowel control? N   Managing your Medications? N   Managing your Finances? N   Housekeeping or managing your Housekeeping? N     Patient Care Team: Midge Minium, MD as PCP - General (Family Medicine) Thea Silversmith, MD as Consulting Physician (Radiation Oncology) Nicholas Lose, MD as Consulting Physician (Hematology and Oncology) Alphonsa Overall, MD as Consulting Physician (General Surgery) Madelin Rear, [facility] (Inactive) as Pharmacist (Pharmacist)  Indicate any recent Medical Services you may have received from other than [facility] providers in the past year (date may be approximate).     Assessment:   This is a routine wellness examination for [place].  Hearing/Vision screen Hearing Screening - Comments:: Bilateral hearing aids Vision Screening - Comments:: [facility] Up to date  Dietary issues and exercise activities discussed: Current Exercise Habits: The patient does not participate in regular exercise at present   Goals Addressed   None    Depression Screen    07/31/2022   10:44 AM 06/25/2022    8:39 AM 12/16/2021   10:24 AM 06/17/2021   10:06 AM 06/17/2021    8:43 AM 01/10/2021   10:30 AM 06/29/2020    9:03 AM  PHQ 2/9 Scores  PHQ - 2 Score 0 0 0 0 0 0 0  PHQ- 9 Score 1 0 1 0  0 0    Fall Risk    06/25/2022    8:40 AM 12/16/2021   10:24 AM 06/17/2021   10:04 AM  06/17/2021    8:38 AM 01/10/2021   10:30 AM  Fall Risk   Falls in the past year? 0 0 0 0 0  Number falls in past yr:    0 0  Injury with Fall?    0 0  Risk for fall due to : No Fall Risks No Fall Risks No Fall Risks  No Fall Risks  Follow up Falls evaluation completed Falls evaluation completed Falls evaluation completed Falls evaluation completed;Falls prevention discussed     FALL RISK PREVENTION PERTAINING TO THE HOME:  Any stairs in or around the home? No  If so, are there any without handrails? No  Home free of loose throw rugs in walkways, pet beds, electrical cords, etc? Yes  Adequate lighting in your home to reduce risk of falls? Yes   ASSISTIVE DEVICES UTILIZED TO PREVENT FALLS:  Life alert? No  Use of a cane, walker or w/c? No  Grab bars in the bathroom? No  Shower chair or bench in shower? Yes  Elevated toilet seat or a handicapped toilet? No   TIMED UP AND GO:  Was the test performed? No .    Cognitive Function:    01/27/2018    9:19 AM  MMSE - Mini Mental State Exam  Orientation to time 5  Orientation to Place 5  Registration 3  Attention/ Calculation 5  Recall 3  Language- name 2 objects 2  Language- repeat 1  Language- follow 3 step command 3  Language- read & follow direction 1  Write a sentence 1  Copy design 1  Total score 30        07/31/2022   10:39 AM  6CIT Screen  What Year? 0 points  What month? 0 points  What time? 0 points  Count back from 20 0  points  Months in reverse 0 points  Repeat phrase 0 points  Total Score 0 points    Immunizations Immunization History  Administered Date(s) Administered   Fluad Quad(high Dose 65+) 06/10/2019, 06/29/2020, 06/17/2021   Influenza Split 06/09/2014   Influenza, High Dose Seasonal PF 06/17/2018, 06/25/2022   Influenza,inj,Quad PF,6+ Mos 07/29/2013   Influenza-Unspecified 06/04/2015, 05/23/2016, 06/16/2017   PFIZER(Purple Top)SARS-COV-2 Vaccination 11/07/2018, 11/29/2019, 06/21/2020,  04/15/2021, 07/25/2022   Pfizer Covid-19 Vaccine Bivalent Booster 65yr & up 08/26/2021   Pneumococcal Conjugate-13 02/27/2014   Pneumococcal Polysaccharide-23 02/06/2015   Tdap 01/02/2016   Zoster Recombinat (Shingrix) 05/15/2017, 08/11/2017   Zoster, Live 03/17/2014    TDAP status: Up to date  Flu Vaccine status: Up to date  Pneumococcal vaccine status: Up to date  Covid-19 vaccine status: Completed vaccines  Qualifies for Shingles Vaccine? No   Zostavax completed No   Shingrix Completed?: Yes  Screening Tests Health Maintenance  Topic Date Due   COLONOSCOPY (Pts [id number] coverage will need to be confirmed)  06/22/2022   MAMMOGRAM  10/25/2022   Medicare Annual Wellness (AWV)  08/01/2023   TETANUS/TDAP  01/01/2026   Pneumonia Vaccine 75Years old  Completed   INFLUENZA VACCINE  Completed   DEXA SCAN  Completed   Hepatitis C Screening  Completed   Zoster Vaccines- Shingrix  Completed   HPV VACCINES  Aged Out   COVID-19 Vaccine  Discontinued    Health Maintenance  Health Maintenance Due  Topic Date Due   COLONOSCOPY (Pts [id number] coverage will need to be confirmed)  06/22/2022    Colorectal cancer screening: No longer required.   Mammogram status: Completed 2023. Repeat every year  Bone Density status: Completed 2022. Results reflect: Bone density results: OSTEOPOROSIS. Repeat every 2 years.  Lung Cancer Screening: (Low Dose CT Chest recommended if Age 72-69-80ars, 30 pack-year currently smoking OR have quit w/in 15years.) does not qualify.   Lung Cancer Screening Referral:   Additional Screening:  Hepatitis C Screening: does not qualify; Completed 2021  Vision Screening: Recommended annual ophthalmology exams for early detection of glaucoma and other disorders of the eye. Is the patient up to date with their annual eye exam?  Yes  Who is the provider or what is the name of the office in which the patient attends annual eye exams?  [facility] If pt is not established with a provider, would they like to be referred to a provider to establish care? No .   Dental Screening: Recommended annual dental exams for proper oral hygiene  Community Resource Referral / Chronic Care Management: CRR required this visit?  No   CCM required this visit?  No      Plan:     I have personally reviewed and noted the following in the patient's chart:   Medical and social history Use of alcohol, tobacco or illicit drugs  Current medications and supplements including opioid prescriptions. Patient is not currently taking opioid prescriptions. Functional ability and status Nutritional status Physical activity Advanced directives List of other physicians Hospitalizations, surgeries, and ER visits in previous 12 months Vitals Screenings to include cognitive, depression, and falls Referrals and appointments  In addition, I have reviewed and discussed with patient certain preventive protocols, quality metrics, and best practice recommendations. A written personalized care plan for preventive services as well as general preventive health recommendations were provided to patient.     JulLeroy KennedyPN   11/36/14/4315Nurse Notes:

## 2022-08-05 ENCOUNTER — Ambulatory Visit (AMBULATORY_SURGERY_CENTER): Payer: Self-pay

## 2022-08-05 VITALS — Ht <= 58 in | Wt 104.0 lb

## 2022-08-05 DIAGNOSIS — Z1211 Encounter for screening for malignant neoplasm of colon: Secondary | ICD-10-CM

## 2022-08-05 MED ORDER — NA SULFATE-K SULFATE-MG SULF 17.5-3.13-1.6 GM/177ML PO SOLN: 1.0000 | Freq: Once | ORAL | 0 refills | Status: AC

## 2022-08-05 NOTE — Progress Notes (Signed)

## 2022-08-15 ENCOUNTER — Encounter: Payer: Medicare HMO | Admitting: Gastroenterology

## 2022-08-25 ENCOUNTER — Encounter: Payer: Self-pay | Admitting: Gastroenterology

## 2022-08-26 ENCOUNTER — Telehealth: Payer: Self-pay

## 2022-08-26 ENCOUNTER — Other Ambulatory Visit (HOSPITAL_COMMUNITY): Payer: Self-pay

## 2022-08-26 NOTE — Telephone Encounter (Signed)
Prolia VOB initiated via [email or web address] 

## 2022-08-26 NOTE — Telephone Encounter (Signed)
Can we verify pt pharmacy benefits for prolia . I have her injection and need to schedule her but we need to know if there is anything due at time of visit

## 2022-08-28 ENCOUNTER — Ambulatory Visit (AMBULATORY_SURGERY_CENTER): Payer: Medicare HMO | Admitting: Gastroenterology

## 2022-08-28 ENCOUNTER — Encounter: Payer: Self-pay | Admitting: Gastroenterology

## 2022-08-28 VITALS — BP 133/58 | HR 87 | Temp 99.6°F | Resp 10 | Ht <= 58 in | Wt 104.0 lb

## 2022-08-28 DIAGNOSIS — D120 Benign neoplasm of cecum: Secondary | ICD-10-CM

## 2022-08-28 DIAGNOSIS — Z1211 Encounter for screening for malignant neoplasm of colon: Secondary | ICD-10-CM | POA: Diagnosis not present

## 2022-08-28 DIAGNOSIS — D124 Benign neoplasm of descending colon: Secondary | ICD-10-CM

## 2022-08-28 DIAGNOSIS — I10 Essential (primary) hypertension: Secondary | ICD-10-CM | POA: Diagnosis not present

## 2022-08-28 MED ORDER — SODIUM CHLORIDE 0.9 % IV SOLN: 500.0000 mL | Freq: Once | INTRAVENOUS | Status: DC

## 2022-08-28 NOTE — Patient Instructions (Signed)
Thank you for letting us take care of your healthcare needs today. See handouts given to you on Polyps  and Diverticulosis.    YOU HAD AN ENDOSCOPIC PROCEDURE TODAY AT [facility]:   Refer to the procedure report that was given to you for any specific questions about what was found during the examination.  If the procedure report does not answer your questions, please call your gastroenterologist to clarify.  If you requested that your care partner not be given the details of your procedure findings, then the procedure report has been included in a sealed envelope for you to review at your convenience later.  YOU SHOULD EXPECT: Some feelings of bloating in the abdomen. Passage of more gas than usual.  Walking can help get rid of the air that was put into your GI tract during the procedure and reduce the bloating. If you had a lower endoscopy (such as a colonoscopy or flexible sigmoidoscopy) you may notice spotting of blood in your stool or on the toilet paper. If you underwent a bowel prep for your procedure, you may not have a normal bowel movement for a few days.  Please Note:  You might notice some irritation and congestion in your nose or some drainage.  This is from the oxygen used during your procedure.  There is no need for concern and it should clear up in a day or so.  SYMPTOMS TO REPORT IMMEDIATELY:  Following lower endoscopy (colonoscopy or flexible sigmoidoscopy):  Excessive amounts of blood in the stool  Significant tenderness or worsening of abdominal pains  Swelling of the abdomen that is new, acute  Fever of 100F or higher  For urgent or emergent issues, a gastroenterologist can be reached at any hour by calling [phone]. Do not use MyChart messaging for urgent concerns.    DIET:  We do recommend a small meal at first, but then you may proceed to your regular diet.  Drink plenty of fluids but you should avoid alcoholic beverages for 24  hours.  ACTIVITY:  You should plan to take it easy for the rest of today and you should NOT DRIVE or use heavy machinery until tomorrow (because of the sedation medicines used during the test).    FOLLOW UP: Our staff will call the number listed on your records the next business day following your procedure.  We will call around 7:15- 8:00 am to check on you and address any questions or concerns that you may have regarding the information given to you following your procedure. If we do not reach you, we will leave a message.     If any biopsies were taken you will be contacted by phone or by letter within the next 1-3 weeks.  Please call us at [phone] if you have not heard about the biopsies in 3 weeks.    SIGNATURES/CONFIDENTIALITY: You and/or your care partner have signed paperwork which will be entered into your electronic medical record.  These signatures attest to the fact that that the information above on your After Visit Summary has been reviewed and is understood.  Full responsibility of the confidentiality of this discharge information lies with you and/or your care-partner.

## 2022-08-28 NOTE — Progress Notes (Signed)
Called to room to assist during endoscopic procedure.  Patient ID and intended procedure confirmed with present staff. Received instructions for my participation in the procedure from the performing physician.  

## 2022-08-28 NOTE — Progress Notes (Signed)
Pt's states no medical or surgical changes since previsit or office visit. 

## 2022-08-28 NOTE — Op Note (Addendum)
[facility] Patient Name: Makenize Messman Procedure Date: 08/28/2022 2:36 PM MRN: [id number] Endoscopist: Thornton Park MD, MD, [id number] Age: 76 Referring MD:  Date of Birth: 1945-11-30 Gender: Female Account #: [id number] Procedure:                Colonoscopy Indications:              Screening for colorectal malignant neoplasm                           Two prior colonoscopies with Dr. Henrene Pastor, last 2013,                            that were normal Medicines:                Monitored Anesthesia Care Procedure:                Pre-Anesthesia Assessment:                           - Prior to the procedure, a History and Physical                            was performed, and patient medications and                            allergies were reviewed. The patient's tolerance of                            previous anesthesia was also reviewed. The risks                            and benefits of the procedure and the sedation                            options and risks were discussed with the patient.                            All questions were answered, and informed consent                            was obtained. Prior Anticoagulants: The patient has                            taken no anticoagulant or antiplatelet agents. ASA                            Grade Assessment: II - A patient with mild systemic                            disease. After reviewing the risks and benefits,                            the patient was deemed in satisfactory condition to  undergo the procedure.                           After obtaining informed consent, the colonoscope                            was passed under direct vision. Throughout the                            procedure, the patient's blood pressure, pulse, and                            oxygen saturations were monitored continuously. The                            CF [id number] #[id number] was introduced  through the anus                            and advanced to the 3 cm into the ileum. A second                            forward view of the right colon was performed. The                            colonoscopy was performed without difficulty. The                            patient tolerated the procedure well. The quality                            of the bowel preparation was good. The terminal                            ileum, ileocecal valve, appendiceal orifice, and                            rectum were photographed. Scope In: 2:50:25 PM Scope Out: 3:06:32 PM Scope Withdrawal Time: 0 hours 13 minutes 32 seconds  Total Procedure Duration: 0 hours 16 minutes 7 seconds  Findings:                 The perianal and digital rectal examinations were                            normal.                           A 3 mm polyp was found in the descending colon. The                            polyp was sessile. The polyp was removed with a                            cold snare. Resection and retrieval were complete.  Estimated blood loss was minimal.                           A 1 mm polyp was found in the ileocecal valve. The                            polyp was sessile. The polyp was removed with a                            cold snare. Resection and retrieval were complete.                            Estimated blood loss was minimal.                           A few small-mouthed diverticula were found in the                            sigmoid colon. Complications:            No immediate complications. Estimated Blood Loss:     Estimated blood loss was minimal. Impression:               - One 3 mm polyp in the descending colon, removed                            with a cold snare. Resected and retrieved.                           - One 1 mm polyp at the ileocecal valve, removed                            with a cold snare. Resected and retrieved.                            - Mild sigmoid diverticulosis.                           - The examination was otherwise normal on direct                            and retroflexion views. Recommendation:           - Patient has a contact number available for                            emergencies. The signs and symptoms of potential                            delayed complications were discussed with the                            patient. Return to normal activities tomorrow.  Written discharge instructions were provided to the                            patient.                           - Resume previous diet. High fiber diet recommended.                           - Continue present medications.                           - Await pathology results.                           - Repeat colonoscopy is not recommended due to                            current age (34 years or older) for surveillance.                           - Emerging evidence supports eating a diet of                            fruits, vegetables, grains, calcium, and yogurt                            while reducing red meat and alcohol may reduce the                            risk of colon cancer.                           - Thank you for allowing me to be involved in your                            colon cancer prevention. Thornton Park MD, MD 08/28/2022 3:13:35 PM This report has been signed electronically.

## 2022-08-28 NOTE — Progress Notes (Signed)
Report to PACU, RN, vss, BBS= Clear.  

## 2022-08-28 NOTE — Progress Notes (Signed)
Referring Provider: Midge Minium, MD Primary Care Physician:  Midge Minium, MD  Indication for Procedure:  Colon cancer Surveillance   IMPRESSION:  Need for colon cancer surveillance Appropriate candidate for monitored anesthesia care  PLAN: Colonoscopy in the [facility] today   HPI: Ellen Beltran is a 76 y.o. female presents for surveillance colonoscopy.  Prior endoscopic history: Normal colonoscopy with Dr. Henrene Pastor 06/22/12    Past Medical History:  Diagnosis Date   Allergy    seasonal   Anemia    Arthritis    Cancer ([place])    left breast   Dental bridge present    left lower   H/O mumps    History of blood transfusion 09/24/2013   History of chemotherapy    stopped 08/2013   History of chicken pox    Hyperlipidemia    Hypertension    Neuropathy due to chemotherapeutic drug ([facility])    hands and feet   Osteopenia    Personal history of chemotherapy    2015   Personal history of radiation therapy    2015    Past Surgical History:  Procedure Laterality Date   ANKLE ARTHROSCOPY WITH RECONSTRUCTION Right 2019   BREAST LUMPECTOMY Left 10/17/2013   BREAST LUMPECTOMY WITH NEEDLE LOCALIZATION AND AXILLARY SENTINEL LYMPH NODE BX Left 10/17/2013   Procedure: BREAST LUMPECTOMY WITH NEEDLE LOCALIZATION AND AXILLARY SENTINEL LYMPH NODE BX;  Surgeon: Shann Medal, MD;  Location: [facility];  Service: General;  Laterality: Left;   CARDIAC CATHETERIZATION  08/31/2003   no disease   COLONOSCOPY     COLONOSCOPY WITH PROPOFOL  06/22/2012   PELVIC FLOOR REPAIR  05/06/2001   cardinal uterosacral colposuspension   PORTACATH PLACEMENT N/A 05/02/2013   Procedure: INSERTION PORT-A-CATH;  Surgeon: Shann Medal, MD;  Location: [facility];  Service: General;  Laterality: N/A;   TOTAL ABDOMINAL HYSTERECTOMY W/ BILATERAL SALPINGOOPHORECTOMY  05/06/2001   TUBAL LIGATION  age 81    Current Outpatient Medications  Medication Sig Dispense Refill   cetirizine  (ZYRTEC) 10 MG tablet Take 1 tablet (10 mg total) by mouth daily as needed for allergies.     fluticasone (FLONASE) 50 MCG/ACT nasal spray Place 2 sprays into both nostrils daily. 16 g 6   irbesartan (AVAPRO) 75 MG tablet Take 1 tablet by mouth once daily 90 tablet 0   Multiple Vitamins-Minerals (ALIVE WOMENS 50+ PO) Take 1 each by mouth daily.      simvastatin (ZOCOR) 40 MG tablet TAKE 1 TABLET BY MOUTH EVERY DAY IN THE EVENING 90 tablet 2   PROLIA 60 MG/ML SOSY injection TO BE ADMINISTERED IN PHYSICIAN'S OFFICE. INJECT ONE SYRINGE SUBCUTANEOUSLY ONCE EVERY 6 MONTHS. REFRIGERATE. USE WITHIN 14 DAYS ONCE AT ROOM TEMPERATURE. 1 mL 2   Current Facility-Administered Medications  Medication Dose Route Frequency Provider Last Rate Last Admin   0.9 %  sodium chloride infusion  500 mL Intravenous Once Thornton Park, MD        Allergies as of 08/28/2022 - Review Complete 08/28/2022  Allergen Reaction Noted   Codeine Other (See Comments) 06/08/2012   Morphine and related Nausea Only 06/08/2012   Sulfa antibiotics Rash 06/08/2012    Family History  Problem Relation Age of Onset   Lung cancer Mother    Heart disease Father    Brain cancer Father    Thyroid disease Sister    Thyroid disease Sister    Thyroid disease Sister    Heart disease Brother  Diabetes Brother    Cancer Maternal Uncle        pancreatic   Colon cancer Maternal Uncle    Cancer Maternal Uncle        colon   Esophageal cancer Neg Hx    Stomach cancer Neg Hx    Rectal cancer Neg Hx      Physical Exam: General:   Alert,  well-nourished, pleasant and cooperative in NAD Head:  Normocephalic and atraumatic. Eyes:  Sclera clear, no icterus.   Conjunctiva pink. Mouth:  No deformity or lesions.   Neck:  Supple; no masses or thyromegaly. Lungs:  Clear throughout to auscultation.   No wheezes. Heart:  Regular rate and rhythm; no murmurs. Abdomen:  Soft, non-tender, nondistended, normal bowel sounds, no rebound or  guarding.  Msk:  Symmetrical. No boney deformities LAD: No inguinal or umbilical LAD Extremities:  No clubbing or edema. Neurologic:  Alert and  oriented x4;  grossly nonfocal Skin:  No obvious rash or bruise. Psych:  Alert and cooperative. Normal mood and affect.     Studies/Results: No results found.    Ole Lafon L. Tarri Glenn, MD, MPH 08/28/2022, 2:38 PM

## 2022-08-29 ENCOUNTER — Other Ambulatory Visit (HOSPITAL_COMMUNITY): Payer: Self-pay

## 2022-08-29 ENCOUNTER — Telehealth: Payer: Self-pay

## 2022-08-29 NOTE — Telephone Encounter (Signed)
  Follow up Call-     08/28/2022    1:56 PM  Call back number  Post procedure Call Back phone  # [phone]  Permission to leave phone message Yes     Patient questions:  Do you have a fever, pain , or abdominal swelling? No. Pain Score  0 *  Have you tolerated food without any problems? Yes.    Have you been able to return to your normal activities? Yes.    Do you have any questions about your discharge instructions: Diet   No. Medications  No. Follow up visit  No.  Do you have questions or concerns about your Care? No.  Actions: * If pain score is 4 or above: No action needed, pain <4.

## 2022-09-02 NOTE — Telephone Encounter (Signed)
Last Prolia inj: 06/25/22 Next Prolia inj DUE: 12/23/22

## 2022-09-10 ENCOUNTER — Other Ambulatory Visit: Payer: Self-pay | Admitting: Family Medicine

## 2022-09-10 DIAGNOSIS — I10 Essential (primary) hypertension: Secondary | ICD-10-CM

## 2022-09-11 ENCOUNTER — Encounter: Payer: Self-pay | Admitting: Gastroenterology

## 2022-09-11 ENCOUNTER — Encounter: Payer: Self-pay | Admitting: Family Medicine

## 2022-09-11 ENCOUNTER — Telehealth: Payer: Self-pay | Admitting: Family Medicine

## 2022-09-11 DIAGNOSIS — M810 Age-related osteoporosis without current pathological fracture: Secondary | ICD-10-CM

## 2022-09-11 NOTE — Telephone Encounter (Signed)
Caller name: Kathe Becton   On DPR?: Yes  Call back number: [phone]  Provider they see: Midge Minium, MD  Reason for call: Daughter to get her mom bone density order sent to the [facility] in [place]. Fax number is [phone].

## 2022-09-11 NOTE — Telephone Encounter (Signed)
Is it ok to place order ?

## 2022-09-12 NOTE — Telephone Encounter (Signed)
Bone density testing typically every 2 years.  Last bone density 11/06/2020, indicated osteoporosis and on current treatment.  New order placed for repeat bone density in February at [facility].

## 2022-09-12 NOTE — Addendum Note (Signed)
Addended by: Merri Ray R on: 09/12/2022 05:10 PM   Modules accepted: Orders

## 2022-09-12 NOTE — Telephone Encounter (Signed)
Pt daughter informed and was happy with this

## 2022-09-16 ENCOUNTER — Other Ambulatory Visit: Payer: Self-pay | Admitting: Family Medicine

## 2022-09-16 DIAGNOSIS — Z1231 Encounter for screening mammogram for malignant neoplasm of breast: Secondary | ICD-10-CM

## 2022-10-30 DIAGNOSIS — H524 Presbyopia: Secondary | ICD-10-CM | POA: Diagnosis not present

## 2022-10-30 DIAGNOSIS — H2513 Age-related nuclear cataract, bilateral: Secondary | ICD-10-CM | POA: Diagnosis not present

## 2022-11-05 ENCOUNTER — Ambulatory Visit
Admission: RE | Admit: 2022-11-05 | Discharge: 2022-11-05 | Disposition: A | Discharge status: Home / Self Care | Payer: Medicare HMO | Source: Ambulatory Visit | Attending: Family Medicine | Admitting: Family Medicine

## 2022-11-05 DIAGNOSIS — Z1231 Encounter for screening mammogram for malignant neoplasm of breast: Secondary | ICD-10-CM

## 2022-11-16 ENCOUNTER — Other Ambulatory Visit: Payer: Self-pay | Admitting: Family Medicine

## 2022-12-08 ENCOUNTER — Other Ambulatory Visit: Payer: Self-pay | Admitting: Family Medicine

## 2022-12-08 DIAGNOSIS — I10 Essential (primary) hypertension: Secondary | ICD-10-CM

## 2022-12-22 NOTE — Telephone Encounter (Signed)
Prolia VOB initiated via [email or web address] 

## 2022-12-25 ENCOUNTER — Encounter: Payer: Self-pay | Admitting: Family Medicine

## 2022-12-25 ENCOUNTER — Ambulatory Visit (INDEPENDENT_AMBULATORY_CARE_PROVIDER_SITE_OTHER): Payer: Medicare HMO | Admitting: Family Medicine

## 2022-12-25 ENCOUNTER — Other Ambulatory Visit (HOSPITAL_COMMUNITY): Payer: Self-pay

## 2022-12-25 VITALS — BP 116/70 | HR 83 | Temp 97.9°F | Ht <= 58 in | Wt 102.2 lb

## 2022-12-25 DIAGNOSIS — M810 Age-related osteoporosis without current pathological fracture: Secondary | ICD-10-CM

## 2022-12-25 DIAGNOSIS — I10 Essential (primary) hypertension: Secondary | ICD-10-CM | POA: Diagnosis not present

## 2022-12-25 DIAGNOSIS — E785 Hyperlipidemia, unspecified: Secondary | ICD-10-CM | POA: Diagnosis not present

## 2022-12-25 LAB — CBC WITH DIFFERENTIAL/PLATELET
Basophils Absolute: 0.1 10*3/uL (ref 0.0–0.1)
Basophils Relative: 0.9 % (ref 0.0–3.0)
Eosinophils Absolute: 0.2 10*3/uL (ref 0.0–0.7)
Eosinophils Relative: 1.8 % (ref 0.0–5.0)
HCT: 36.6 % (ref 36.0–46.0)
Hemoglobin: 12.3 g/dL (ref 12.0–15.0)
Lymphocytes Relative: 33.1 % (ref 12.0–46.0)
Lymphs Abs: 3.3 10*3/uL (ref 0.7–4.0)
MCHC: 33.6 g/dL (ref 30.0–36.0)
MCV: 88.5 fl (ref 78.0–100.0)
Monocytes Absolute: 0.8 10*3/uL (ref 0.1–1.0)
Monocytes Relative: 8.3 % (ref 3.0–12.0)
Neutro Abs: 5.6 10*3/uL (ref 1.4–7.7)
Neutrophils Relative %: 55.9 % (ref 43.0–77.0)
Platelets: 386 10*3/uL (ref 150.0–400.0)
RBC: 4.13 Mil/uL (ref 3.87–5.11)
RDW: 13.8 % (ref 11.5–15.5)
WBC: 10 10*3/uL (ref 4.0–10.5)

## 2022-12-25 LAB — TSH: TSH: 3.18 u[IU]/mL (ref 0.35–5.50)

## 2022-12-25 LAB — BASIC METABOLIC PANEL
BUN: 13 mg/dL (ref 6–23)
CO2: 30 mEq/L (ref 19–32)
Calcium: 10.8 mg/dL — ABNORMAL HIGH (ref 8.4–10.5)
Chloride: 100 mEq/L (ref 96–112)
Creatinine, Ser: 0.82 mg/dL (ref 0.40–1.20)
GFR: 69.98 mL/min (ref >60.00)
Glucose, Bld: 103 mg/dL — ABNORMAL HIGH (ref 70–99)
Potassium: 4.4 mEq/L (ref 3.5–5.1)
Sodium: 141 mEq/L (ref 135–145)

## 2022-12-25 LAB — LIPID PANEL
Cholesterol: 218 mg/dL — ABNORMAL HIGH (ref 0–200)
HDL: 68.8 mg/dL (ref >39.00)
LDL Cholesterol: 113 mg/dL — ABNORMAL HIGH (ref 0–99)
NonHDL: 149.37
Total CHOL/HDL Ratio: 3
Triglycerides: 183 mg/dL — ABNORMAL HIGH (ref 0.0–149.0)
VLDL: 36.6 mg/dL (ref 0.0–40.0)

## 2022-12-25 LAB — HEPATIC FUNCTION PANEL
ALT: 18 U/L (ref 0–35)
AST: 20 U/L (ref 0–37)
Albumin: 4.5 g/dL (ref 3.5–5.2)
Alkaline Phosphatase: 75 U/L (ref 39–117)
Bilirubin, Direct: 0.1 mg/dL (ref 0.0–0.3)
Total Bilirubin: 0.4 mg/dL (ref 0.2–1.2)
Total Protein: 7.5 g/dL (ref 6.0–8.3)

## 2022-12-25 MED ORDER — DENOSUMAB 60 MG/ML ~~LOC~~ SOSY
60.0000 mg | PREFILLED_SYRINGE | Freq: Once | SUBCUTANEOUS | Status: AC
Start: 2022-12-25 — End: 2022-12-25
  Administered 2022-12-25: 60 mg via SUBCUTANEOUS

## 2022-12-25 NOTE — Progress Notes (Signed)
   Subjective:    Patient ID: Ellen Beltran, female    DOB: [birth year], 77 y.o.   MRN: [id number]  HPI HTN- chronic problem, on Irbesartan 75mg  daily w/ good control. No CP, SOB, HA's, visual changes, edema.   Hyperlipidemia- currently on Simvastatin 40mg  daily.  No abd pain, N/V.  Osteoporosis- ongoing issue.  Due for Prolia today.  Due for repeat DEXA- schedule for June.  Not currently taking any extra Ca and Vit D.   Review of Systems For ROS see HPI     Objective:   Physical Exam Vitals reviewed.  Constitutional:      General: She is not in acute distress.    Appearance: Normal appearance. She is well-developed. She is not ill-appearing.  HENT:     Head: Normocephalic and atraumatic.  Eyes:     Conjunctiva/sclera: Conjunctivae normal.     Pupils: Pupils are equal, round, and reactive to light.  Neck:     Thyroid: No thyromegaly.  Cardiovascular:     Rate and Rhythm: Normal rate and regular rhythm.     Pulses: Normal pulses.     Heart sounds: Normal heart sounds. No murmur heard. Pulmonary:     Effort: Pulmonary effort is normal. No respiratory distress.     Breath sounds: Normal breath sounds.  Abdominal:     General: There is no distension.     Palpations: Abdomen is soft.     Tenderness: There is no abdominal tenderness.  Musculoskeletal:     Cervical back: Normal range of motion and neck supple.     Right lower leg: No edema.     Left lower leg: No edema.  Lymphadenopathy:     Cervical: No cervical adenopathy.  Skin:    General: Skin is warm and dry.  Neurological:     General: No focal deficit present.     Mental Status: She is alert and oriented to person, place, and time.  Psychiatric:        Behavior: Behavior normal.           Assessment & Plan:

## 2022-12-25 NOTE — Patient Instructions (Signed)
Schedule your complete physical in 6 months We'll notify you of your lab results and make any changes if needed Keep up the good work on healthy diet and regular exercise- you look great! Call with any questions or concerns Stay Safe!  Stay Healthy!! Happy Spring!!! 

## 2022-12-25 NOTE — Assessment & Plan Note (Signed)
Chronic problem.  On Simvastatin 40mg daily w/o difficulty.  Check labs.  Adjust meds prn  

## 2022-12-25 NOTE — Addendum Note (Signed)
Addended by: Eldred Manges on: 12/25/2022 09:24 AM   Modules accepted: Orders

## 2022-12-25 NOTE — Assessment & Plan Note (Signed)
Chronic problem.  Currently well controlled on Irbesartan 75mg  daily.  Asymptomatic.  Check labs due to ARB use but no anticipated med changes.  Will follow.

## 2022-12-25 NOTE — Telephone Encounter (Signed)
Pt ready for scheduling for Prolia on or after : 12/25/22  Out-of-pocket cost due at time of visit: $327  Primary: [employer] - Medicare Prolia co-insurance: 20% Admin fee co-insurance: 20%  Secondary: n/a Prolia co-insurance:  Admin fee co-insurance:   Medical Benefit Details: Date Benefits were checked: 12/22/22 Deductible: no/ Coinsurance: 20%/ Admin Fee: 20%  Prior Auth: Approved  PA# [id number] Expiration Date: 12/25/23   Pharmacy benefit: Copay $100 If patient wants fill through the pharmacy benefit please send prescription to: [facility], and include estimated need by date in rx notes. Pharmacy will ship medication directly to the office.  Patient not eligible for Prolia Copay Card. Copay Card can make patient's cost as little as $25. Link to apply: [email or web address]://[email or web address].[email or web address]  ** This summary of benefits is an estimation of the patient's out-of-pocket cost. Exact cost may very based on individual plan coverage.

## 2022-12-25 NOTE — Assessment & Plan Note (Signed)
Ongoing issue for pt.  She has DEXA scheduled for June.  Prolia given today.  Will continue to follow.

## 2022-12-26 ENCOUNTER — Telehealth: Payer: Self-pay

## 2022-12-26 NOTE — Telephone Encounter (Signed)
Pt aware of lab results 

## 2022-12-26 NOTE — Telephone Encounter (Signed)
-----   Message from Sheliah Hatch, MD sent at 12/26/2022 12:09 PM EDT ----- Labs are stable and look good.  Triglycerides (fatty part of blood) are a little elevated but this will improve w/ healthy diet and regular physical activity.  No change in medications at this time.  Calcium is slightly elevated but this tends to fluctuate for you.  No cause for concern.  Thyroid is normal- great news!

## 2022-12-26 NOTE — Telephone Encounter (Signed)
Pt received her Prolia injection yesterday 12/25/22 at her visit . I got a message from the pharmacy team stating the pt owes $327 due at the time of visit . Pt states she only has to pay $100 .  She was very argument   with me on the phone and yelling at me due to this and I had to ask her to calm down . I advised her I would ask Tresa Endo about this and we may have to call Claris Che . After that the pt calm down and expressed to verbal understanding

## 2022-12-26 NOTE — Telephone Encounter (Signed)
Left pt a VM to return my call  

## 2023-01-09 NOTE — Telephone Encounter (Signed)
I did and she Is correct she only pays $ 100

## 2023-01-16 ENCOUNTER — Other Ambulatory Visit: Payer: Self-pay | Admitting: Family Medicine

## 2023-01-27 ENCOUNTER — Other Ambulatory Visit: Payer: Self-pay | Admitting: Family Medicine

## 2023-01-27 DIAGNOSIS — I10 Essential (primary) hypertension: Secondary | ICD-10-CM

## 2023-03-03 ENCOUNTER — Ambulatory Visit
Admission: RE | Admit: 2023-03-03 | Discharge: 2023-03-03 | Disposition: A | Discharge status: Home / Self Care | Payer: Medicare HMO | Source: Ambulatory Visit | Attending: Family Medicine | Admitting: Family Medicine

## 2023-03-03 DIAGNOSIS — M810 Age-related osteoporosis without current pathological fracture: Secondary | ICD-10-CM

## 2023-03-03 DIAGNOSIS — E349 Endocrine disorder, unspecified: Secondary | ICD-10-CM | POA: Diagnosis not present

## 2023-03-03 DIAGNOSIS — N959 Unspecified menopausal and perimenopausal disorder: Secondary | ICD-10-CM | POA: Diagnosis not present

## 2023-03-09 ENCOUNTER — Telehealth: Payer: Self-pay

## 2023-03-09 NOTE — Telephone Encounter (Signed)
-----   Message from Sheliah Hatch, MD sent at 03/09/2023  7:36 AM EDT ----- Your bone density shows continued osteoporosis.  I recommend continuing your Prolia injections every 6 months.  Please make sure you are taking daily Calcium and Vit D

## 2023-05-05 ENCOUNTER — Other Ambulatory Visit: Payer: Self-pay | Admitting: Family Medicine

## 2023-05-05 DIAGNOSIS — I10 Essential (primary) hypertension: Secondary | ICD-10-CM

## 2023-05-06 ENCOUNTER — Ambulatory Visit (INDEPENDENT_AMBULATORY_CARE_PROVIDER_SITE_OTHER): Payer: Medicare HMO | Admitting: *Deleted

## 2023-05-06 DIAGNOSIS — Z0000 Encounter for general adult medical examination without abnormal findings: Secondary | ICD-10-CM

## 2023-05-06 NOTE — Progress Notes (Signed)
Subjective:   Ellen Beltran is a 77 y.o. female who presents for Medicare Annual (Subsequent) preventive examination.  Visit Complete: Virtual  I connected with  [name] on 05/06/23 by a audio enabled telemedicine application and verified that I am speaking with the correct person using two identifiers.  Patient Location: Home  Provider Location: Home Office  I discussed the limitations of evaluation and management by telemedicine. The patient expressed understanding and agreed to proceed.    Review of Systems      Vital Signs: Unable to obtain new vitals due to this being a telehealth visit.  Cardiac Risk Factors include: advanced age (>69men, >99 women);family history of premature cardiovascular disease;hypertension     Objective:    There were no vitals filed for this visit. There is no height or weight on file to calculate BMI.     05/06/2023    9:12 AM 07/31/2022   10:38 AM 06/17/2021    8:37 AM 06/11/2020   11:23 AM 01/27/2018    9:18 AM 01/21/2017    8:25 AM 10/22/2016    9:47 AM  Advanced Directives  Does Patient Have a Medical Advance Directive? Yes Yes No No No No No  Type of Sales promotion account executive of Attorney       Copy of Healthcare Power of Attorney in Chart? Yes - validated most recent copy scanned in chart (See row information) No - copy requested       Would patient like information on creating a medical advance directive?   No - Patient declined Yes (MAU/Ambulatory/Procedural Areas - Information given) No - Patient declined No - Patient declined     Current Medications (verified) Outpatient Encounter Medications as of 05/06/2023  Medication Sig   cetirizine (ZYRTEC) 10 MG tablet Take 1 tablet (10 mg total) by mouth daily as needed for allergies.   fluticasone (FLONASE) 50 MCG/ACT nasal spray SPRAY 2 SPRAYS INTO EACH NOSTRIL EVERY DAY   irbesartan (AVAPRO) 75 MG tablet Take 1 tablet by mouth once daily    Multiple Vitamins-Minerals (ALIVE WOMENS 50+ PO) Take 2 each by mouth daily.   PROLIA 60 MG/ML SOSY injection TO BE ADMINISTERED IN PHYSICIAN'S OFFICE. INJECT ONE SYRINGE SUBCUTANEOUSLY ONCE EVERY 6 MONTHS. REFRIGERATE. USE WITHIN 14 DAYS ONCE AT ROOM TEMPERATURE.   simvastatin (ZOCOR) 40 MG tablet TAKE 1 TABLET BY MOUTH EVERY DAY IN THE EVENING   [DISCONTINUED] irbesartan (AVAPRO) 75 MG tablet Take 1 tablet by mouth once daily   No facility-administered encounter medications on file as of 05/06/2023.    Allergies (verified) Codeine, Morphine and codeine, and Sulfa antibiotics   History: Past Medical History:  Diagnosis Date   Allergy    seasonal   Anemia    Arthritis    Cancer (HCC)    left breast   Dental bridge present    left lower   H/O mumps    History of blood transfusion 09/24/2013   History of chemotherapy    stopped 08/2013   History of chicken pox    Hyperlipidemia    Hypertension    Neuropathy due to chemotherapeutic drug (HCC)    hands and feet   Osteopenia    Personal history of chemotherapy    2015   Personal history of radiation therapy    2015   Past Surgical History:  Procedure Laterality Date   ANKLE ARTHROSCOPY WITH RECONSTRUCTION Right 2019   BREAST LUMPECTOMY Left 10/17/2013   BREAST  LUMPECTOMY WITH NEEDLE LOCALIZATION AND AXILLARY SENTINEL LYMPH NODE BX Left 10/17/2013   Procedure: BREAST LUMPECTOMY WITH NEEDLE LOCALIZATION AND AXILLARY SENTINEL LYMPH NODE BX;  Surgeon: Kandis Cocking, MD;  Location: [facility];  Service: General;  Laterality: Left;   CARDIAC CATHETERIZATION  08/31/2003   no disease   COLONOSCOPY     COLONOSCOPY WITH PROPOFOL  06/22/2012   PELVIC FLOOR REPAIR  05/06/2001   cardinal uterosacral colposuspension   PORTACATH PLACEMENT N/A 05/02/2013   Procedure: INSERTION PORT-A-CATH;  Surgeon: Kandis Cocking, MD;  Location: [facility];  Service: General;  Laterality: N/A;   TOTAL ABDOMINAL HYSTERECTOMY W/ BILATERAL  SALPINGOOPHORECTOMY  05/06/2001   TUBAL LIGATION  age 65   Family History  Problem Relation Age of Onset   Lung cancer Mother    Heart disease Father    Brain cancer Father    Thyroid disease Sister    Thyroid disease Sister    Thyroid disease Sister    Heart disease Brother    Diabetes Brother    Cancer Maternal Uncle        pancreatic   Colon cancer Maternal Uncle    Cancer Maternal Uncle        colon   Esophageal cancer Neg Hx    Stomach cancer Neg Hx    Rectal cancer Neg Hx    Social History   Socioeconomic History   Marital status: Widowed    Spouse name: Not on file   Number of children: Not on file   Years of education: Not on file   Highest education level: Not on file  Occupational History   Occupation: Retired  Tobacco Use   Smoking status: Never   Smokeless tobacco: Never  Vaping Use   Vaping status: Never Used  Substance and Sexual Activity   Alcohol use: Yes    Comment: less than weekly, occasional glass of wine   Drug use: Never   Sexual activity: Yes    Birth control/protection: Post-menopausal, Surgical  Other Topics Concern   Not on file  Social History Narrative   Not on file   Social Determinants of Health   Financial Resource Strain: Low Risk  (05/06/2023)   Overall Financial Resource Strain (CARDIA)    Difficulty of Paying Living Expenses: Not hard at all  Food Insecurity: No Food Insecurity (05/06/2023)   Hunger Vital Sign    Worried About Running Out of Food in the Last Year: Never true    Ran Out of Food in the Last Year: Never true  Transportation Needs: No Transportation Needs (05/06/2023)   PRAPARE - Administrator, Civil Service (Medical): No    Lack of Transportation (Non-Medical): No  Physical Activity: Insufficiently Active (05/06/2023)   Exercise Vital Sign    Days of Exercise per Week: 3 days    Minutes of Exercise per Session: 30 min  Stress: No Stress Concern Present (05/06/2023)   [employer] of  Occupational Health - Occupational Stress Questionnaire    Feeling of Stress : Only a little  Social Connections: Moderately Integrated (05/06/2023)   Social Connection and Isolation Panel [NHANES]    Frequency of Communication with Friends and Family: More than three times a week    Frequency of Social Gatherings with Friends and Family: More than three times a week    Attends Religious Services: More than 4 times per year    Active Member of [employer] or Organizations: Yes    Attends [employer]  or Organization Meetings: 1 to 4 times per year    Marital Status: Widowed    Tobacco Counseling Counseling given: Not Answered   Clinical Intake:  Pre-visit preparation completed: Yes  Pain : No/denies pain     Diabetes: No  How often do you need to have someone help you when you read instructions, pamphlets, or other written materials from your doctor or pharmacy?: 1 - Never  Interpreter Needed?: No  Information entered by :: Remi Haggard LPN   Activities of Daily Living    05/06/2023    9:11 AM 07/31/2022   10:42 AM  In your present state of health, do you have any difficulty performing the following activities:  Hearing? 1 0  Vision? 0 0  Difficulty concentrating or making decisions? 0 0  Walking or climbing stairs? 0 0  Dressing or bathing? 0 0  Doing errands, shopping? 0 0  Preparing Food and eating ? N N  Using the Toilet? N N  In the past six months, have you accidently leaked urine? N N  Do you have problems with loss of bowel control? N N  Managing your Medications? N N  Managing your Finances? N N  Housekeeping or managing your Housekeeping? N N    Patient Care Team: Sheliah Hatch, MD as PCP - General (Family Medicine) Lurline Hare, MD as Consulting Physician (Radiation Oncology) Serena Croissant, MD as Consulting Physician (Hematology and Oncology) Ovidio Kin, MD as Consulting Physician (General Surgery) Dahlia Byes, [facility] (Inactive) as Pharmacist  (Pharmacist)  Indicate any recent Medical Services you may have received from other than [facility] providers in the past year (date may be approximate).     Assessment:   This is a routine wellness examination for Saysha.  Hearing/Vision screen Hearing Screening - Comments:: Bilateral hearing aids Vision Screening - Comments:: Up to date [facility]   Dietary issues and exercise activities discussed:     Goals Addressed             This Visit's Progress    Patient Stated       Maintain current health       Depression Screen    05/06/2023    9:17 AM 12/25/2022    8:59 AM 07/31/2022   10:44 AM 06/25/2022    8:39 AM 12/16/2021   10:24 AM 06/17/2021   10:06 AM 06/17/2021    8:43 AM  PHQ 2/9 Scores  PHQ - 2 Score 0 0 0 0 0 0 0  PHQ- 9 Score 0 0 1 0 1 0     Fall Risk    05/06/2023    9:04 AM 12/25/2022    8:59 AM 06/25/2022    8:40 AM 12/16/2021   10:24 AM 06/17/2021   10:04 AM  Fall Risk   Falls in the past year? 0 0 0 0 0  Number falls in past yr: 0 0     Injury with Fall? 0 0     Risk for fall due to :  No Fall Risks No Fall Risks No Fall Risks No Fall Risks  Follow up Falls evaluation completed;Education provided;Falls prevention discussed Falls evaluation completed Falls evaluation completed Falls evaluation completed Falls evaluation completed    MEDICARE RISK AT HOME: Medicare Risk at Home Any stairs in or around the home?: No If so, are there any without handrails?: No Home free of loose throw rugs in walkways, pet beds, electrical cords, etc?: Yes Adequate lighting in your home to reduce risk  of falls?: Yes Life alert?: No Use of a cane, walker or w/c?: No Grab bars in the bathroom?: Yes Shower chair or bench in shower?: Yes Elevated toilet seat or a handicapped toilet?: No  TIMED UP AND GO:  Was the test performed?  No    Cognitive Function:    01/27/2018    9:19 AM  MMSE - Mini Mental State Exam  Orientation to time 5  Orientation to Place 5   Registration 3  Attention/ Calculation 5  Recall 3  Language- name 2 objects 2  Language- repeat 1  Language- follow 3 step command 3  Language- read & follow direction 1  Write a sentence 1  Copy design 1  Total score 30        05/06/2023    9:14 AM 07/31/2022   10:39 AM  6CIT Screen  What Year? 0 points 0 points  What month? 0 points 0 points  What time? 0 points 0 points  Count back from 20 0 points 0 points  Months in reverse 0 points 0 points  Repeat phrase 0 points 0 points  Total Score 0 points 0 points    Immunizations Immunization History  Administered Date(s) Administered   Fluad Quad(high Dose 65+) 06/10/2019, 06/29/2020, 06/17/2021   Influenza Split 06/09/2014   Influenza, High Dose Seasonal PF 06/17/2018, 06/25/2022   Influenza,inj,Quad PF,6+ Mos 07/29/2013   Influenza-Unspecified 06/04/2015, 05/23/2016, 06/16/2017   PFIZER(Purple Top)SARS-COV-2 Vaccination 11/07/2018, 11/29/2019, 06/21/2020, 04/15/2021, 07/25/2022   Pfizer Covid-19 Vaccine Bivalent Booster 71yrs & up 08/26/2021   Pneumococcal Conjugate-13 02/27/2014   Pneumococcal Polysaccharide-23 02/06/2015   Tdap 01/02/2016   Zoster Recombinant(Shingrix) 05/15/2017, 08/11/2017   Zoster, Live 03/17/2014    TDAP status: Up to date  Flu Vaccine status: Up to date  Pneumococcal vaccine status: Up to date  Covid-19 vaccine status: Declined, Education has been provided regarding the importance of this vaccine but patient still declined. Advised may receive this vaccine at local pharmacy or Health Dept.or vaccine clinic. Aware to provide a copy of the vaccination record if obtained from local pharmacy or Health Dept. Verbalized acceptance and understanding.  Qualifies for Shingles Vaccine? No   Zostavax completed Yes   Shingrix Completed?: Yes  Screening Tests Health Maintenance  Topic Date Due   INFLUENZA VACCINE  04/16/2023   MAMMOGRAM  11/06/2023   Medicare Annual Wellness (AWV)  05/05/2024    DTaP/Tdap/Td (2 - Td or Tdap) 01/01/2026   Pneumonia Vaccine 5+ Years old  Completed   DEXA SCAN  Completed   Hepatitis C Screening  Completed   Zoster Vaccines- Shingrix  Completed   HPV VACCINES  Aged Out   Colonoscopy  Discontinued   COVID-19 Vaccine  Discontinued    Health Maintenance  Health Maintenance Due  Topic Date Due   INFLUENZA VACCINE  04/16/2023    Colorectal cancer screening: No longer required.   Mammogram status: Completed  . Repeat every year  Bone Density status: Completed 2024. Results reflect: Bone density results: OSTEOPOROSIS. Repeat every 2 years.  Lung Cancer Screening: (Low Dose CT Chest recommended if Age 68-80 years, 20 pack-year currently smoking OR have quit w/in 15years.) does not qualify.   Lung Cancer Screening Referral:   Additional Screening:  Hepatitis C Screening completed 2015  Vision Screening: Recommended annual ophthalmology exams for early detection of glaucoma and other disorders of the eye. Is the patient up to date with their annual eye exam?  Yes  Who is the provider or what is  the name of the office in which the patient attends annual eye exams? [facility] If pt is not established with a provider, would they like to be referred to a provider to establish care? No .   Dental Screening: Recommended annual dental exams for proper oral hygiene    Community Resource Referral / Chronic Care Management: CRR required this visit?  No   CCM required this visit?  No     Plan:     I have personally reviewed and noted the following in the patient's chart:   Medical and social history Use of alcohol, tobacco or illicit drugs  Current medications and supplements including opioid prescriptions. Patient is not currently taking opioid prescriptions. Functional ability and status Nutritional status Physical activity Advanced directives List of other physicians Hospitalizations, surgeries, and ER visits in previous 12  months Vitals Screenings to include cognitive, depression, and falls Referrals and appointments  In addition, I have reviewed and discussed with patient certain preventive protocols, quality metrics, and best practice recommendations. A written personalized care plan for preventive services as well as general preventive health recommendations were provided to patient.     Remi Haggard, LPN   [id number]   After Visit Summary: (MyChart) Due to this being a telephonic visit, the after visit summary with patients personalized plan was offered to patient via MyChart   Nurse Notes:

## 2023-05-06 NOTE — Patient Instructions (Signed)
Ellen Beltran , Thank you for taking time to come for your Medicare Wellness Visit. I appreciate your ongoing commitment to your health goals. Please review the following plan we discussed and let me know if I can assist you in the future.   Screening recommendations/referrals: Colonoscopy: no longer required Mammogram: up to date Bone Density: up to date Recommended yearly ophthalmology/optometry visit for glaucoma screening and checkup Recommended yearly dental visit for hygiene and checkup  Vaccinations: Influenza vaccine: up to date Pneumococcal vaccine: up to date Tdap vaccine: up to date Shingles vaccine: up to date    Advanced directives: yes on file     Preventive Care 65 Years and Older, Female Preventive care refers to lifestyle choices and visits with your health care provider that can promote health and wellness. What does preventive care include? A yearly physical exam. This is also called an annual well check. Dental exams once or twice a year. Routine eye exams. Ask your health care provider how often you should have your eyes checked. Personal lifestyle choices, including: Daily care of your teeth and gums. Regular physical activity. Eating a healthy diet. Avoiding tobacco and drug use. Limiting alcohol use. Practicing safe sex. Taking low-dose aspirin every day. Taking vitamin and mineral supplements as recommended by your health care provider. What happens during an annual well check? The services and screenings done by your health care provider during your annual well check will depend on your age, overall health, lifestyle risk factors, and family history of disease. Counseling  Your health care provider may ask you questions about your: Alcohol use. Tobacco use. Drug use. Emotional well-being. Home and relationship well-being. Sexual activity. Eating habits. History of falls. Memory and ability to understand (cognition). Work and work  Astronomer. Reproductive health. Screening  You may have the following tests or measurements: Height, weight, and BMI. Blood pressure. Lipid and cholesterol levels. These may be checked every 5 years, or more frequently if you are over 30 years old. Skin check. Lung cancer screening. You may have this screening every year starting at age 76 if you have a 30-pack-year history of smoking and currently smoke or have quit within the past 15 years. Fecal occult blood test (FOBT) of the stool. You may have this test every year starting at age 76. Flexible sigmoidoscopy or colonoscopy. You may have a sigmoidoscopy every 5 years or a colonoscopy every 10 years starting at age 76. Hepatitis C blood test. Hepatitis B blood test. Sexually transmitted disease (STD) testing. Diabetes screening. This is done by checking your blood sugar (glucose) after you have not eaten for a while (fasting). You may have this done every 1-3 years. Bone density scan. This is done to screen for osteoporosis. You may have this done starting at age 76. Mammogram. This may be done every 1-2 years. Talk to your health care provider about how often you should have regular mammograms. Talk with your health care provider about your test results, treatment options, and if necessary, the need for more tests. Vaccines  Your health care provider may recommend certain vaccines, such as: Influenza vaccine. This is recommended every year. Tetanus, diphtheria, and acellular pertussis (Tdap, Td) vaccine. You may need a Td booster every 10 years. Zoster vaccine. You may need this after age 76. Pneumococcal 13-valent conjugate (PCV13) vaccine. One dose is recommended after age 76. Pneumococcal polysaccharide (PPSV23) vaccine. One dose is recommended after age 29. Talk to your health care provider about which screenings and vaccines you need  and how often you need them. This information is not intended to replace advice given to you by  your health care provider. Make sure you discuss any questions you have with your health care provider. Document Released: 09/28/2015 Document Revised: 05/21/2016 Document Reviewed: 07/03/2015 Elsevier Interactive Patient Education  2017 [employer].  Fall Prevention in the Home Falls can cause injuries. They can happen to people of all ages. There are many things you can do to make your home safe and to help prevent falls. What can I do on the outside of my home? Regularly fix the edges of walkways and driveways and fix any cracks. Remove anything that might make you trip as you walk through a door, such as a raised step or threshold. Trim any bushes or trees on the path to your home. Use bright outdoor lighting. Clear any walking paths of anything that might make someone trip, such as rocks or tools. Regularly check to see if handrails are loose or broken. Make sure that both sides of any steps have handrails. Any raised decks and porches should have guardrails on the edges. Have any leaves, snow, or ice cleared regularly. Use sand or salt on walking paths during winter. Clean up any spills in your garage right away. This includes oil or grease spills. What can I do in the bathroom? Use night lights. Install grab bars by the toilet and in the tub and shower. Do not use towel bars as grab bars. Use non-skid mats or decals in the tub or shower. If you need to sit down in the shower, use a plastic, non-slip stool. Keep the floor dry. Clean up any water that spills on the floor as soon as it happens. Remove soap buildup in the tub or shower regularly. Attach bath mats securely with double-sided non-slip rug tape. Do not have throw rugs and other things on the floor that can make you trip. What can I do in the bedroom? Use night lights. Make sure that you have a light by your bed that is easy to reach. Do not use any sheets or blankets that are too big for your bed. They should not hang  down onto the floor. Have a firm chair that has side arms. You can use this for support while you get dressed. Do not have throw rugs and other things on the floor that can make you trip. What can I do in the kitchen? Clean up any spills right away. Avoid walking on wet floors. Keep items that you use a lot in easy-to-reach places. If you need to reach something above you, use a strong step stool that has a grab bar. Keep electrical cords out of the way. Do not use floor polish or wax that makes floors slippery. If you must use wax, use non-skid floor wax. Do not have throw rugs and other things on the floor that can make you trip. What can I do with my stairs? Do not leave any items on the stairs. Make sure that there are handrails on both sides of the stairs and use them. Fix handrails that are broken or loose. Make sure that handrails are as long as the stairways. Check any carpeting to make sure that it is firmly attached to the stairs. Fix any carpet that is loose or worn. Avoid having throw rugs at the top or bottom of the stairs. If you do have throw rugs, attach them to the floor with carpet tape. Make sure that you  have a light switch at the top of the stairs and the bottom of the stairs. If you do not have them, ask someone to add them for you. What else can I do to help prevent falls? Wear shoes that: Do not have high heels. Have rubber bottoms. Are comfortable and fit you well. Are closed at the toe. Do not wear sandals. If you use a stepladder: Make sure that it is fully opened. Do not climb a closed stepladder. Make sure that both sides of the stepladder are locked into place. Ask someone to hold it for you, if possible. Clearly mark and make sure that you can see: Any grab bars or handrails. First and last steps. Where the edge of each step is. Use tools that help you move around (mobility aids) if they are needed. These  include: Canes. Walkers. Scooters. Crutches. Turn on the lights when you go into a dark area. Replace any light bulbs as soon as they burn out. Set up your furniture so you have a clear path. Avoid moving your furniture around. If any of your floors are uneven, fix them. If there are any pets around you, be aware of where they are. Review your medicines with your doctor. Some medicines can make you feel dizzy. This can increase your chance of falling. Ask your doctor what other things that you can do to help prevent falls. This information is not intended to replace advice given to you by your health care provider. Make sure you discuss any questions you have with your health care provider. Document Released: 06/28/2009 Document Revised: 02/07/2016 Document Reviewed: 10/06/2014 Elsevier Interactive Patient Education  2017 [employer].

## 2023-06-23 ENCOUNTER — Other Ambulatory Visit: Payer: Self-pay | Admitting: Family Medicine

## 2023-06-30 ENCOUNTER — Telehealth: Payer: Self-pay | Admitting: Family Medicine

## 2023-06-30 NOTE — Telephone Encounter (Signed)
[facility] called to inform us about Prolia 180 day supply delivery. Pt has paid $100 copay.   Will be delivered 07/07/2023 here at this office.

## 2023-07-02 ENCOUNTER — Encounter: Payer: Self-pay | Admitting: Family Medicine

## 2023-07-02 ENCOUNTER — Ambulatory Visit: Payer: Medicare HMO | Admitting: Family Medicine

## 2023-07-02 VITALS — BP 122/64 | HR 68 | Temp 98.0°F | Ht <= 58 in | Wt 102.4 lb

## 2023-07-02 DIAGNOSIS — M810 Age-related osteoporosis without current pathological fracture: Secondary | ICD-10-CM

## 2023-07-02 DIAGNOSIS — Z0000 Encounter for general adult medical examination without abnormal findings: Secondary | ICD-10-CM

## 2023-07-02 DIAGNOSIS — I10 Essential (primary) hypertension: Secondary | ICD-10-CM

## 2023-07-02 LAB — BASIC METABOLIC PANEL
BUN: 18 mg/dL (ref 6–23)
CO2: 30 meq/L (ref 19–32)
Calcium: 10.7 mg/dL — ABNORMAL HIGH (ref 8.4–10.5)
Chloride: 102 meq/L (ref 96–112)
Creatinine, Ser: 0.86 mg/dL (ref 0.40–1.20)
GFR: 65.85 mL/min (ref >60.00)
Glucose, Bld: 98 mg/dL (ref 70–99)
Potassium: 4.3 meq/L (ref 3.5–5.1)
Sodium: 142 meq/L (ref 135–145)

## 2023-07-02 LAB — HEPATIC FUNCTION PANEL
ALT: 18 U/L (ref 0–35)
AST: 21 U/L (ref 0–37)
Albumin: 4.3 g/dL (ref 3.5–5.2)
Alkaline Phosphatase: 64 U/L (ref 39–117)
Bilirubin, Direct: 0.1 mg/dL (ref 0.0–0.3)
Total Bilirubin: 0.5 mg/dL (ref 0.2–1.2)
Total Protein: 7.4 g/dL (ref 6.0–8.3)

## 2023-07-02 LAB — CBC WITH DIFFERENTIAL/PLATELET
Basophils Absolute: 0.1 10*3/uL (ref 0.0–0.1)
Basophils Relative: 1 % (ref 0.0–3.0)
Eosinophils Absolute: 0.3 10*3/uL (ref 0.0–0.7)
Eosinophils Relative: 3.5 % (ref 0.0–5.0)
HCT: 35.5 % — ABNORMAL LOW (ref 36.0–46.0)
Hemoglobin: 11.7 g/dL — ABNORMAL LOW (ref 12.0–15.0)
Lymphocytes Relative: 33.4 % (ref 12.0–46.0)
Lymphs Abs: 3.2 10*3/uL (ref 0.7–4.0)
MCHC: 32.9 g/dL (ref 30.0–36.0)
MCV: 90.4 fL (ref 78.0–100.0)
Monocytes Absolute: 1 10*3/uL (ref 0.1–1.0)
Monocytes Relative: 10.5 % (ref 3.0–12.0)
Neutro Abs: 4.9 10*3/uL (ref 1.4–7.7)
Neutrophils Relative %: 51.6 % (ref 43.0–77.0)
Platelets: 383 10*3/uL (ref 150.0–400.0)
RBC: 3.92 Mil/uL (ref 3.87–5.11)
RDW: 13.6 % (ref 11.5–15.5)
WBC: 9.6 10*3/uL (ref 4.0–10.5)

## 2023-07-02 LAB — LIPID PANEL
Cholesterol: 209 mg/dL — ABNORMAL HIGH (ref 0–200)
HDL: 73.8 mg/dL (ref >39.00)
LDL Cholesterol: 117 mg/dL — ABNORMAL HIGH (ref 0–99)
NonHDL: 135.19
Total CHOL/HDL Ratio: 3
Triglycerides: 91 mg/dL (ref 0.0–149.0)
VLDL: 18.2 mg/dL (ref 0.0–40.0)

## 2023-07-02 LAB — VITAMIN D 25 HYDROXY (VIT D DEFICIENCY, FRACTURES): VITD: 66.04 ng/mL (ref 30.00–100.00)

## 2023-07-02 LAB — TSH: TSH: 3.89 u[IU]/mL (ref 0.35–5.50)

## 2023-07-02 NOTE — Progress Notes (Signed)
Subjective:    Patient ID: Ellen Beltran, female    DOB: 02-20-1946, 77 y.o.   MRN: [id number]  HPI CPE- UTD on mammo, Tdap, colonoscopy, PNA, flu.  Pt reports feeling well.  Patient Care Team    Relationship Specialty Notifications Start End  Sheliah Hatch, MD PCP - General Family Medicine  01/17/16   Lurline Hare, MD Consulting Physician Radiation Oncology  05/02/13   Serena Croissant, MD Consulting Physician Hematology and Oncology  01/17/16   Ovidio Kin, MD Consulting Physician General Surgery  01/27/18   Dahlia Byes, [facility] (Inactive) Pharmacist Pharmacist  12/20/19    Comment: phone number [phone]     Health Maintenance  Topic Date Due   COVID-19 Vaccine (8 - 2023-24 season) 05/17/2023   MAMMOGRAM  11/06/2023   Medicare Annual Wellness (AWV)  05/05/2024   DTaP/Tdap/Td (2 - Td or Tdap) 01/01/2026   Colonoscopy  08/28/2032   Pneumonia Vaccine 55+ Years old  Completed   INFLUENZA VACCINE  Completed   DEXA SCAN  Completed   Hepatitis C Screening  Completed   Zoster Vaccines- Shingrix  Completed   HPV VACCINES  Aged Out      Review of Systems Patient reports no vision/ hearing changes, adenopathy,fever, weight change,  persistant/recurrent hoarseness , swallowing issues, chest pain, palpitations, edema, persistant/recurrent cough, hemoptysis, dyspnea (rest/exertional/paroxysmal nocturnal), gastrointestinal bleeding (melena, rectal bleeding), abdominal pain, significant heartburn, bowel changes, GU symptoms (dysuria, hematuria, incontinence), Gyn symptoms (abnormal  bleeding, pain),  syncope, focal weakness, memory loss, skin/hair/nail changes, abnormal bruising or bleeding, anxiety, or depression.   + chronic neuropathy    Objective:   Physical Exam General Appearance:    Alert, cooperative, no distress, appears stated age  Head:    Normocephalic, without obvious abnormality, atraumatic  Eyes:    PERRL, conjunctiva/corneas clear, EOM's intact both eyes  Ears:     Normal TM's and external ear canals, both ears  Nose:   Nares normal, septum midline, mucosa normal, no drainage    or sinus tenderness  Throat:   Lips, mucosa, and tongue normal; teeth and gums normal  Neck:   Supple, symmetrical, trachea midline, no adenopathy;    Thyroid: no enlargement/tenderness/nodules  Back:     Symmetric, no curvature, ROM normal, no CVA tenderness  Lungs:     Clear to auscultation bilaterally, respirations unlabored  Chest Wall:    No tenderness or deformity   Heart:    Regular rate and rhythm, S1 and S2 normal, no murmur, rub   or gallop  Breast Exam:    Deferred to GYN  Abdomen:     Soft, non-tender, bowel sounds active all four quadrants,    no masses, no organomegaly  Genitalia:    Deferred to GYN  Rectal:    Extremities:   Extremities normal, atraumatic, no cyanosis or edema  Pulses:   2+ and symmetric all extremities  Skin:   Skin color, texture, turgor normal, no rashes or lesions  Lymph nodes:   Cervical, supraclavicular, and axillary nodes normal  Neurologic:   CNII-XII intact, normal strength, sensation and reflexes    throughout          Assessment & Plan:

## 2023-07-02 NOTE — Patient Instructions (Signed)
Follow up in 6 months to recheck blood pressure and cholesterol We'll notify you of your lab results and make any changes if needed Keep up the good work on healthy diet and regular exercise- you look great! Call with any questions or concerns Stay Safe!  Stay Healthy! Happy Fall!!

## 2023-07-02 NOTE — Assessment & Plan Note (Signed)
Pt's PE WNL.  UTD on mammo, colonoscopy, immunizations.  Check labs.  Anticipatory guidance provided.

## 2023-07-02 NOTE — Assessment & Plan Note (Signed)
Chronic problem.  On Prolia.  Check Vit D and replete prn.

## 2023-07-02 NOTE — Assessment & Plan Note (Signed)
Chronic problem.  Currently well controlled.  Asymptomatic.  Check labs but no anticipated med changes. ?

## 2023-07-03 ENCOUNTER — Telehealth: Payer: Self-pay

## 2023-07-03 NOTE — Telephone Encounter (Signed)
-----   Message from Neena Rhymes sent at 07/03/2023  7:33 AM EDT ----- Labs are stable and look great!  No changes at this time

## 2023-07-03 NOTE — Telephone Encounter (Signed)
Called pt unable to leave message    Pts Prolia is in office and will need a nurse visit

## 2023-07-06 NOTE — Telephone Encounter (Signed)
Pt has been notified Pt has nurse visit

## 2023-07-14 ENCOUNTER — Ambulatory Visit (INDEPENDENT_AMBULATORY_CARE_PROVIDER_SITE_OTHER): Payer: Medicare HMO

## 2023-07-14 DIAGNOSIS — M810 Age-related osteoporosis without current pathological fracture: Secondary | ICD-10-CM

## 2023-07-14 MED ORDER — DENOSUMAB 60 MG/ML ~~LOC~~ SOSY
60.0000 mg | PREFILLED_SYRINGE | SUBCUTANEOUS | Status: DC
Start: 2023-07-14 — End: 2024-01-07
  Administered 2023-07-14 – 2024-01-07 (×2): 60 mg via SUBCUTANEOUS

## 2023-07-14 NOTE — Progress Notes (Signed)
Ellen Beltran is a 77 y.o. female presents to the office today for Prolia  injection, per physician's orders.  Prolia 60mg /mL  Subcutaneous was administered Rt upper arm today. Patient tolerated injection. Patient due for follow up labs/provider appt: Yes. Date due: 12/31/2023, appt made Yes Patient next injection due: 01/13/2024, appt made No  Eldred Manges

## 2023-08-26 ENCOUNTER — Other Ambulatory Visit: Payer: Self-pay | Admitting: Family Medicine

## 2023-08-26 DIAGNOSIS — Z1231 Encounter for screening mammogram for malignant neoplasm of breast: Secondary | ICD-10-CM

## 2023-08-31 ENCOUNTER — Other Ambulatory Visit: Payer: Self-pay | Admitting: Family Medicine

## 2023-08-31 DIAGNOSIS — I10 Essential (primary) hypertension: Secondary | ICD-10-CM

## 2023-09-03 ENCOUNTER — Other Ambulatory Visit: Payer: Self-pay | Admitting: Family Medicine

## 2023-09-03 DIAGNOSIS — I10 Essential (primary) hypertension: Secondary | ICD-10-CM

## 2023-10-08 ENCOUNTER — Other Ambulatory Visit: Payer: Self-pay | Admitting: Family Medicine

## 2023-10-22 ENCOUNTER — Telehealth: Payer: Self-pay

## 2023-10-22 ENCOUNTER — Other Ambulatory Visit (HOSPITAL_COMMUNITY): Payer: Self-pay

## 2023-10-22 NOTE — Telephone Encounter (Signed)
 Ellen Beltran

## 2023-10-22 NOTE — Telephone Encounter (Signed)
 Prolia VOB initiated via [email or web address]  Next Prolia inj DUE: 01/11/24

## 2023-10-26 ENCOUNTER — Other Ambulatory Visit (HOSPITAL_COMMUNITY): Payer: Self-pay

## 2023-10-26 NOTE — Telephone Encounter (Addendum)
 Pt ready for scheduling for PROLIA  on or after : 01/11/24  Out-of-pocket cost due at time of visit: $357  Number of injection/visits approved: 2  Primary: [employer] MEDICARE Prolia  co-insurance: 20% Admin fee co-insurance: 20%  Secondary: --- Prolia  co-insurance:  Admin fee co-insurance:   Medical Benefit Details: Date Benefits were checked: 10/22/23 Deductible: NO/ Coinsurance: 20%/ Admin Fee: 20%  Prior Auth: APPROVED  PA# [id number] Expiration Date: 12/25/22-12/25/23  # of doses approved: 2  Pharmacy benefit: Copay $--- (REFILL TOO SOON, LAST FILLED 12/22/23) If patient wants fill through the pharmacy benefit please send prescription to: [employer], and include estimated need by date in rx notes. Pharmacy will ship medication directly to the office.  Patient NOT eligible for Prolia  Copay Card. Copay Card can make patient's cost as little as $25. Link to apply: [email or web address]://www.[email or web address]  ** This summary of benefits is an estimation of the patient's out-of-pocket cost. Exact cost may very based on individual plan coverage.

## 2023-11-09 ENCOUNTER — Ambulatory Visit
Admission: RE | Admit: 2023-11-09 | Discharge: 2023-11-09 | Disposition: A | Discharge status: Home / Self Care | Payer: Medicare HMO | Source: Ambulatory Visit | Attending: Family Medicine | Admitting: Family Medicine

## 2023-11-09 DIAGNOSIS — Z1231 Encounter for screening mammogram for malignant neoplasm of breast: Secondary | ICD-10-CM | POA: Diagnosis not present

## 2023-11-10 DIAGNOSIS — H2513 Age-related nuclear cataract, bilateral: Secondary | ICD-10-CM | POA: Diagnosis not present

## 2023-11-12 ENCOUNTER — Other Ambulatory Visit: Payer: Self-pay | Admitting: Family Medicine

## 2023-11-12 DIAGNOSIS — R928 Other abnormal and inconclusive findings on diagnostic imaging of breast: Secondary | ICD-10-CM

## 2023-11-24 ENCOUNTER — Ambulatory Visit: Payer: Medicare HMO

## 2023-11-24 ENCOUNTER — Ambulatory Visit
Admission: RE | Admit: 2023-11-24 | Discharge: 2023-11-24 | Disposition: A | Discharge status: Home / Self Care | Payer: Medicare HMO | Source: Ambulatory Visit | Attending: Family Medicine | Admitting: Family Medicine

## 2023-11-24 DIAGNOSIS — R928 Other abnormal and inconclusive findings on diagnostic imaging of breast: Secondary | ICD-10-CM | POA: Diagnosis not present

## 2023-12-09 ENCOUNTER — Other Ambulatory Visit: Payer: Self-pay | Admitting: Family Medicine

## 2023-12-09 DIAGNOSIS — I10 Essential (primary) hypertension: Secondary | ICD-10-CM

## 2023-12-25 ENCOUNTER — Telehealth: Payer: Self-pay | Admitting: Family Medicine

## 2023-12-25 ENCOUNTER — Telehealth: Payer: Self-pay

## 2023-12-25 NOTE — Telephone Encounter (Signed)
 Copied from CRM [id number]. Topic: Clinical - Prescription Issue >> Dec 25, 2023  1:58 PM Florestine Avers wrote: Reason for CRM: [facility] called and she stated that the patient has had an increase in her Prolia medication and they needs to verify if the patient is ok with the price. Typically its 100$, and this time its 645$. Patient can reach the pharmacy at [phone] 730am-730pm Monday-Friday Est standard time. The pharmacy stated they will not ship the medication to the clinic until the patient confirms if she is okay with the new pricing.

## 2023-12-25 NOTE — Telephone Encounter (Signed)
 Pt will like for his nurse to give her a call back at her earliest convenience. Please advise, Thanks

## 2023-12-25 NOTE — Telephone Encounter (Signed)
 Called back and informed patient daughter about the message from the pharmacy

## 2023-12-25 NOTE — Telephone Encounter (Signed)
 Called and informed patient of this and she stated "absolutely not I will not pay that, I will call the pharmacy now"

## 2023-12-31 ENCOUNTER — Ambulatory Visit: Payer: Medicare HMO | Admitting: Family Medicine

## 2024-01-07 ENCOUNTER — Encounter: Payer: Self-pay | Admitting: Family Medicine

## 2024-01-07 ENCOUNTER — Ambulatory Visit: Payer: Medicare HMO | Admitting: Family Medicine

## 2024-01-07 ENCOUNTER — Other Ambulatory Visit (HOSPITAL_COMMUNITY): Payer: Self-pay

## 2024-01-07 ENCOUNTER — Telehealth: Payer: Self-pay

## 2024-01-07 VITALS — BP 124/62 | HR 74 | Temp 97.8°F | Ht <= 58 in | Wt 101.2 lb

## 2024-01-07 DIAGNOSIS — E785 Hyperlipidemia, unspecified: Secondary | ICD-10-CM

## 2024-01-07 DIAGNOSIS — I10 Essential (primary) hypertension: Secondary | ICD-10-CM

## 2024-01-07 DIAGNOSIS — M810 Age-related osteoporosis without current pathological fracture: Secondary | ICD-10-CM

## 2024-01-07 LAB — CBC WITH DIFFERENTIAL/PLATELET
Basophils Absolute: 0.1 10*3/uL (ref 0.0–0.1)
Basophils Relative: 1 % (ref 0.0–3.0)
Eosinophils Absolute: 0.1 10*3/uL (ref 0.0–0.7)
Eosinophils Relative: 1.5 % (ref 0.0–5.0)
HCT: 37.6 % (ref 36.0–46.0)
Hemoglobin: 12.4 g/dL (ref 12.0–15.0)
Lymphocytes Relative: 32.8 % (ref 12.0–46.0)
Lymphs Abs: 3.1 10*3/uL (ref 0.7–4.0)
MCHC: 33.1 g/dL (ref 30.0–36.0)
MCV: 90 fl (ref 78.0–100.0)
Monocytes Absolute: 1 10*3/uL (ref 0.1–1.0)
Monocytes Relative: 10.5 % (ref 3.0–12.0)
Neutro Abs: 5.2 10*3/uL (ref 1.4–7.7)
Neutrophils Relative %: 54.2 % (ref 43.0–77.0)
Platelets: 384 10*3/uL (ref 150.0–400.0)
RBC: 4.17 Mil/uL (ref 3.87–5.11)
RDW: 14.1 % (ref 11.5–15.5)
WBC: 9.6 10*3/uL (ref 4.0–10.5)

## 2024-01-07 LAB — BASIC METABOLIC PANEL WITH GFR
BUN: 17 mg/dL (ref 6–23)
CO2: 30 meq/L (ref 19–32)
Calcium: 10.9 mg/dL — ABNORMAL HIGH (ref 8.4–10.5)
Chloride: 99 meq/L (ref 96–112)
Creatinine, Ser: 0.89 mg/dL (ref 0.40–1.20)
GFR: 62.97 mL/min (ref >60.00)
Glucose, Bld: 104 mg/dL — ABNORMAL HIGH (ref 70–99)
Potassium: 4.4 meq/L (ref 3.5–5.1)
Sodium: 139 meq/L (ref 135–145)

## 2024-01-07 LAB — LIPID PANEL
Cholesterol: 217 mg/dL — ABNORMAL HIGH (ref 0–200)
HDL: 80.4 mg/dL (ref >39.00)
LDL Cholesterol: 116 mg/dL — ABNORMAL HIGH (ref 0–99)
NonHDL: 136.58
Total CHOL/HDL Ratio: 3
Triglycerides: 101 mg/dL (ref 0.0–149.0)
VLDL: 20.2 mg/dL (ref 0.0–40.0)

## 2024-01-07 LAB — HEPATIC FUNCTION PANEL
ALT: 21 U/L (ref 0–35)
AST: 23 U/L (ref 0–37)
Albumin: 4.7 g/dL (ref 3.5–5.2)
Alkaline Phosphatase: 57 U/L (ref 39–117)
Bilirubin, Direct: 0.1 mg/dL (ref 0.0–0.3)
Total Bilirubin: 0.6 mg/dL (ref 0.2–1.2)
Total Protein: 7.7 g/dL (ref 6.0–8.3)

## 2024-01-07 LAB — VITAMIN D 25 HYDROXY (VIT D DEFICIENCY, FRACTURES): VITD: 64.56 ng/mL (ref 30.00–100.00)

## 2024-01-07 LAB — TSH: TSH: 3.57 u[IU]/mL (ref 0.35–5.50)

## 2024-01-07 MED ORDER — DENOSUMAB 60 MG/ML ~~LOC~~ SOSY: 60.0000 mg | PREFILLED_SYRINGE | Freq: Once | SUBCUTANEOUS | Status: DC

## 2024-01-07 NOTE — Patient Instructions (Signed)
 Schedule your complete physical in 6 months We'll notify you of your lab results and make any changes if needed Keep up the good work on healthy diet and regular exercise- you look GREAT!!! Call with any questions or concerns Stay Safe!  Stay Healthy! Happy Spring!!!

## 2024-01-07 NOTE — Telephone Encounter (Signed)
-----   Message from Laymon Priest sent at 01/07/2024  3:45 PM EDT ----- Labs are stable and look good!  (Please note- I may not comment on every lab value that is noted as abnormal.  Trust when I say I review all of them, compare them to previous values, and put them in the correct medical context.  If I have any concerns- or any follow up or changes are needed- I promise to let you know)

## 2024-01-07 NOTE — Telephone Encounter (Signed)
 Pt has been notified.

## 2024-01-07 NOTE — Progress Notes (Signed)
   Subjective:    Patient ID: Ellen Beltran, female    DOB: 07-Feb-1946, 77 y.o.   MRN: [id number]  HPI HTN- chronic problem, on Irbesartan  75mg  daily w/ good control.  Denies CP, SOB, HA's, visual changes, edema.  Hyperlipidemia- chronic problem, on Simvastatin  40mg  daily.  No abd pain, N/V.  Osteoporosis- due for repeat Prolia .  Pt reports this will be her last injection due to cost.  She is willing to switch to Fosamax.  No falls   Review of Systems For ROS see HPI     Objective:   Physical Exam Vitals reviewed.  Constitutional:      General: She is not in acute distress.    Appearance: Normal appearance. She is well-developed. She is not ill-appearing.  HENT:     Head: Normocephalic and atraumatic.  Eyes:     Conjunctiva/sclera: Conjunctivae normal.     Pupils: Pupils are equal, round, and reactive to light.  Neck:     Thyroid : No thyromegaly.  Cardiovascular:     Rate and Rhythm: Normal rate and regular rhythm.     Pulses: Normal pulses.     Heart sounds: Normal heart sounds. No murmur heard. Pulmonary:     Effort: Pulmonary effort is normal. No respiratory distress.     Breath sounds: Normal breath sounds.  Abdominal:     General: There is no distension.     Palpations: Abdomen is soft.     Tenderness: There is no abdominal tenderness.  Musculoskeletal:     Cervical back: Normal range of motion and neck supple.     Right lower leg: No edema.     Left lower leg: No edema.  Lymphadenopathy:     Cervical: No cervical adenopathy.  Skin:    General: Skin is warm and dry.  Neurological:     General: No focal deficit present.     Mental Status: She is alert and oriented to person, place, and time.  Psychiatric:        Mood and Affect: Mood normal.        Behavior: Behavior normal.        Thought Content: Thought content normal.           Assessment & Plan:

## 2024-01-07 NOTE — Telephone Encounter (Signed)
 New PA submitted via [employer]. Authorization Number: [id number]

## 2024-01-07 NOTE — Assessment & Plan Note (Signed)
 Ongoing issue for pt.  Check Vit D and Ca.  This will be pt's last Prolia  injxn due to cost.  At next visit would like to switch to Fosamax.

## 2024-01-07 NOTE — Assessment & Plan Note (Signed)
 Chronic problem.  Excellent control on Irbesartan  75mg  daily.  Currently asymptomatic.  Check labs due to ARB use but no anticipated med changes.

## 2024-01-07 NOTE — Assessment & Plan Note (Signed)
Chronic problem.  On Simvastatin '40mg'$  daily w/o difficulty.  Check labs.  Adjust meds prn

## 2024-03-07 ENCOUNTER — Other Ambulatory Visit: Payer: Self-pay | Admitting: Family Medicine

## 2024-03-07 DIAGNOSIS — I10 Essential (primary) hypertension: Secondary | ICD-10-CM

## 2024-03-16 ENCOUNTER — Encounter: Payer: Self-pay | Admitting: Family Medicine

## 2024-03-16 ENCOUNTER — Ambulatory Visit: Admitting: Family Medicine

## 2024-03-16 VITALS — BP 124/70 | HR 77 | Temp 98.1°F | Ht <= 58 in | Wt 102.2 lb

## 2024-03-16 DIAGNOSIS — R21 Rash and other nonspecific skin eruption: Secondary | ICD-10-CM | POA: Diagnosis not present

## 2024-03-16 LAB — CBC WITH DIFFERENTIAL/PLATELET
Basophils Absolute: 0.1 10*3/uL (ref 0.0–0.1)
Basophils Relative: 0.9 % (ref 0.0–3.0)
Eosinophils Absolute: 0.2 10*3/uL (ref 0.0–0.7)
Eosinophils Relative: 2 % (ref 0.0–5.0)
HCT: 34.9 % — ABNORMAL LOW (ref 36.0–46.0)
Hemoglobin: 11.6 g/dL — ABNORMAL LOW (ref 12.0–15.0)
Lymphocytes Relative: 33.1 % (ref 12.0–46.0)
Lymphs Abs: 2.9 10*3/uL (ref 0.7–4.0)
MCHC: 33.3 g/dL (ref 30.0–36.0)
MCV: 88.4 fl (ref 78.0–100.0)
Monocytes Absolute: 1 10*3/uL (ref 0.1–1.0)
Monocytes Relative: 11.7 % (ref 3.0–12.0)
Neutro Abs: 4.5 10*3/uL (ref 1.4–7.7)
Neutrophils Relative %: 52.3 % (ref 43.0–77.0)
Platelets: 360 10*3/uL (ref 150.0–400.0)
RBC: 3.95 Mil/uL (ref 3.87–5.11)
RDW: 13.5 % (ref 11.5–15.5)
WBC: 8.6 10*3/uL (ref 4.0–10.5)

## 2024-03-16 LAB — BASIC METABOLIC PANEL WITH GFR
BUN: 19 mg/dL (ref 6–23)
CO2: 30 meq/L (ref 19–32)
Calcium: 10.6 mg/dL — ABNORMAL HIGH (ref 8.4–10.5)
Chloride: 102 meq/L (ref 96–112)
Creatinine, Ser: 0.99 mg/dL (ref 0.40–1.20)
GFR: 55.34 mL/min — ABNORMAL LOW (ref >60.00)
Glucose, Bld: 99 mg/dL (ref 70–99)
Potassium: 4.2 meq/L (ref 3.5–5.1)
Sodium: 140 meq/L (ref 135–145)

## 2024-03-16 LAB — HEPATIC FUNCTION PANEL
ALT: 24 U/L (ref 0–35)
AST: 21 U/L (ref 0–37)
Albumin: 4.5 g/dL (ref 3.5–5.2)
Alkaline Phosphatase: 63 U/L (ref 39–117)
Bilirubin, Direct: 0 mg/dL (ref 0.0–0.3)
Total Bilirubin: 0.3 mg/dL (ref 0.2–1.2)
Total Protein: 7.6 g/dL (ref 6.0–8.3)

## 2024-03-16 NOTE — Progress Notes (Signed)
   Subjective:    Patient ID: Ellen Beltran, female    DOB: 18-Oct-1945, 78 y.o.   MRN: [id number]  HPI Rash- pt reports rash on bilateral thighs that will come and go over the last few months.  Not itchy.  Not painful.  'it's more like a welp than bumps'.  Will have some warmth to touch.  Will spontaneously resolve after 1-2 days.  She is asymptomatic today but they have pictures   Review of Systems For ROS see HPI     Objective:   Physical Exam Vitals reviewed.  Constitutional:      General: She is not in acute distress.    Appearance: Normal appearance. She is not ill-appearing.  HENT:     Head: Normocephalic and atraumatic.  Eyes:     Extraocular Movements: Extraocular movements intact.     Conjunctiva/sclera: Conjunctivae normal.  Musculoskeletal:     Cervical back: Neck supple.  Lymphadenopathy:     Cervical: No cervical adenopathy.  Skin:    General: Skin is warm and dry.     Findings: No bruising, erythema or rash.  Neurological:     General: No focal deficit present.     Mental Status: She is alert and oriented to person, place, and time.  Psychiatric:        Mood and Affect: Mood normal.        Behavior: Behavior normal.        Thought Content: Thought content normal.           Assessment & Plan:  Rash- new.  Will occur on bilateral thighs over the last few months.  Not itchy or painful.  Seems to occur more after being outside.  Pictures appear to be a heat reaction rather than a true rash.  Suspect this is occurring when she is outside/gardening in non-breathable fabrics in capri pants or [place] shorts lengths.  Encouraged sweat wicking fabrics.  Will check labs to r/o underlying metabolic causes but this is unlikely.  Reviewed supportive care and red flags that should prompt return.  Pt expressed understanding and is in agreement w/ plan.

## 2024-03-16 NOTE — Patient Instructions (Addendum)
 Follow up as needed or as scheduled We'll notify you of your lab results and make any changes if needed Try and wear more breathable, airy clothing- particularly when working outside Keep a log of your rash- when it occurs, how long it lasts, what you were doing prior to appearance, any new or different foods, etc Call with any questions or concerns Stay Safe!  Stay Healthy! Happy 4th!

## 2024-03-17 ENCOUNTER — Ambulatory Visit: Payer: Self-pay | Admitting: Family Medicine

## 2024-03-17 NOTE — Progress Notes (Signed)
 Spoke with patient and made her aware of her lab results. She had no questions at this time

## 2024-03-29 ENCOUNTER — Telehealth: Payer: Self-pay | Admitting: Family Medicine

## 2024-03-29 ENCOUNTER — Encounter: Payer: Self-pay | Admitting: Family Medicine

## 2024-03-29 NOTE — Telephone Encounter (Unsigned)
 Copied from CRM [id number]. Topic: General - Other >> Mar 29, 2024  5:34 PM Gennette ORN wrote: Reason for CRM: Shruti T. [phone] is calling to see where this medication by name of denosumab  (PROLIA ) injection 60 mg she wants to know where it is coming from.

## 2024-03-30 NOTE — Telephone Encounter (Signed)
 Attempted call back to Shruti T at [phone] to discuss this.  Patient is not going to be continuing this injection due to cost, when we were administering this it was ordered from Physician [facility].  Pharmacy PA team note below from 2025 approval:  Pt ready for scheduling for PROLIA  on or after : 01/11/24   Out-of-pocket cost due at time of visit: $357   Number of injection/visits approved: 2   Primary: [employer] MEDICARE Prolia  co-insurance: 20% Admin fee co-insurance: 20%   Secondary: --- Prolia  co-insurance:  Admin fee co-insurance:    Medical Benefit Details: Date Benefits were checked: 10/22/23 Deductible: NO/ Coinsurance: 20%/ Admin Fee: 20%   Prior Auth: APPROVED  PA# [id number] Expiration Date: 01/07/24-01/06/25  # of doses approved: 2   Pharmacy benefit: Copay $--- (REFILL TOO SOON, LAST FILLED 12/22/23) If patient wants fill through the pharmacy benefit please send prescription to: [employer], and include estimated need by date in rx notes. Pharmacy will ship medication directly to the office.   Patient NOT eligible for Prolia  Copay Card. Copay Card can make patient's cost as little as $25. Link to apply: [email or web address]   ** This summary of benefits is an estimation of the patient's out-of-pocket cost. Exact cost may very based on individual plan coverage.

## 2024-04-05 ENCOUNTER — Telehealth: Payer: Self-pay

## 2024-04-05 ENCOUNTER — Other Ambulatory Visit (HOSPITAL_COMMUNITY): Payer: Self-pay

## 2024-04-05 NOTE — Telephone Encounter (Signed)
 Copied from CRM [id number]. Topic: Clinical - Medication Question >> Apr 05, 2024 10:10 AM Lavanda D wrote: Reason for CRM: Penne Cotiviti on behalf of [facility] calling in regards to the denosumab  (PROLIA ) injection 60 mg administered on 01/07/24, she would like to verify the following: was the drug supplie by a free drug program, is it supplied by a specialty pharmacy, or is it already available in the office stock? Also was it a buy and bill or not? Phone #: [phone]   ----------------------------------------------------------------------- From previous Reason for Contact - Other: Reason for CRM:

## 2024-04-05 NOTE — Telephone Encounter (Signed)
 Called back and provided the requested information.

## 2024-04-25 ENCOUNTER — Other Ambulatory Visit (HOSPITAL_COMMUNITY): Payer: Self-pay

## 2024-05-18 MED ORDER — DENOSUMAB 60 MG/ML ~~LOC~~ SOSY: 60.0000 mg | PREFILLED_SYRINGE | Freq: Once | SUBCUTANEOUS | Status: DC

## 2024-05-18 NOTE — Addendum Note (Signed)
 Addended by: Rylen Swindler on: 05/18/2024 11:47 AM   Modules accepted: Orders

## 2024-06-03 ENCOUNTER — Other Ambulatory Visit: Payer: Self-pay | Admitting: Family Medicine

## 2024-06-03 DIAGNOSIS — I10 Essential (primary) hypertension: Secondary | ICD-10-CM

## 2024-06-06 ENCOUNTER — Other Ambulatory Visit: Payer: Self-pay | Admitting: Family Medicine

## 2024-06-18 ENCOUNTER — Other Ambulatory Visit: Payer: Self-pay | Admitting: Family Medicine

## 2024-07-08 ENCOUNTER — Ambulatory Visit (INDEPENDENT_AMBULATORY_CARE_PROVIDER_SITE_OTHER): Admitting: Family Medicine

## 2024-07-08 ENCOUNTER — Encounter: Payer: Self-pay | Admitting: Family Medicine

## 2024-07-08 VITALS — BP 138/68 | HR 72 | Temp 97.8°F | Resp 14 | Ht <= 58 in | Wt 103.6 lb

## 2024-07-08 DIAGNOSIS — M810 Age-related osteoporosis without current pathological fracture: Secondary | ICD-10-CM | POA: Diagnosis not present

## 2024-07-08 DIAGNOSIS — Z0000 Encounter for general adult medical examination without abnormal findings: Secondary | ICD-10-CM | POA: Diagnosis not present

## 2024-07-08 DIAGNOSIS — I10 Essential (primary) hypertension: Secondary | ICD-10-CM

## 2024-07-08 LAB — TSH: TSH: 4.71 u[IU]/mL (ref 0.35–5.50)

## 2024-07-08 LAB — CBC WITH DIFFERENTIAL/PLATELET
Basophils Absolute: 0.1 K/uL (ref 0.0–0.1)
Basophils Relative: 0.6 % (ref 0.0–3.0)
Eosinophils Absolute: 0.2 K/uL (ref 0.0–0.7)
Eosinophils Relative: 1.9 % (ref 0.0–5.0)
HCT: 37.7 % (ref 36.0–46.0)
Hemoglobin: 12.3 g/dL (ref 12.0–15.0)
Lymphocytes Relative: 36.6 % (ref 12.0–46.0)
Lymphs Abs: 3.7 K/uL (ref 0.7–4.0)
MCHC: 32.5 g/dL (ref 30.0–36.0)
MCV: 90.2 fl (ref 78.0–100.0)
Monocytes Absolute: 1 K/uL (ref 0.1–1.0)
Monocytes Relative: 9.7 % (ref 3.0–12.0)
Neutro Abs: 5.1 K/uL (ref 1.4–7.7)
Neutrophils Relative %: 51.2 % (ref 43.0–77.0)
Platelets: 387 K/uL (ref 150.0–400.0)
RBC: 4.18 Mil/uL (ref 3.87–5.11)
RDW: 13.6 % (ref 11.5–15.5)
WBC: 10 K/uL (ref 4.0–10.5)

## 2024-07-08 LAB — BASIC METABOLIC PANEL WITH GFR
BUN: 17 mg/dL (ref 6–23)
CO2: 29 meq/L (ref 19–32)
Calcium: 10.7 mg/dL — ABNORMAL HIGH (ref 8.4–10.5)
Chloride: 99 meq/L (ref 96–112)
Creatinine, Ser: 0.92 mg/dL (ref 0.40–1.20)
GFR: 60.3 mL/min (ref >60.00)
Glucose, Bld: 95 mg/dL (ref 70–99)
Potassium: 3.9 meq/L (ref 3.5–5.1)
Sodium: 140 meq/L (ref 135–145)

## 2024-07-08 LAB — HEPATIC FUNCTION PANEL
ALT: 19 U/L (ref 0–35)
AST: 20 U/L (ref 0–37)
Albumin: 4.6 g/dL (ref 3.5–5.2)
Alkaline Phosphatase: 64 U/L (ref 39–117)
Bilirubin, Direct: 0.1 mg/dL (ref 0.0–0.3)
Total Bilirubin: 0.5 mg/dL (ref 0.2–1.2)
Total Protein: 7.8 g/dL (ref 6.0–8.3)

## 2024-07-08 LAB — LIPID PANEL
Cholesterol: 207 mg/dL — ABNORMAL HIGH (ref 0–200)
HDL: 70.6 mg/dL (ref >39.00)
LDL Cholesterol: 109 mg/dL — ABNORMAL HIGH (ref 0–99)
NonHDL: 136.02
Total CHOL/HDL Ratio: 3
Triglycerides: 134 mg/dL (ref 0.0–149.0)
VLDL: 26.8 mg/dL (ref 0.0–40.0)

## 2024-07-08 LAB — VITAMIN D 25 HYDROXY (VIT D DEFICIENCY, FRACTURES): VITD: 64.6 ng/mL (ref 30.00–100.00)

## 2024-07-08 MED ORDER — ALENDRONATE SODIUM 70 MG PO TABS: 70.0000 mg | ORAL_TABLET | ORAL | 3 refills | Status: AC

## 2024-07-08 NOTE — Assessment & Plan Note (Signed)
 Pt's PE WNL.  Initially BP was elevated but decreased prior to leaving.  UTD on mammo, DEXA, immunizations.  Check labs.  Anticipatory guidance provided.

## 2024-07-08 NOTE — Progress Notes (Signed)
   Subjective:    Patient ID: Ellen Beltran, female    DOB: 1945-12-27, 78 y.o.   MRN: [id number]  HPI CPE- UTD on mammo, Tdap, PNA, flu, DEXA.  Pt reports BP is elevated today but pt reports a stressful week.  Health Maintenance  Topic Date Due   Medicare Annual Wellness (AWV)  05/05/2024   Mammogram  11/08/2024   COVID-19 Vaccine (9 - 2025-26 season) 12/26/2024   DTaP/Tdap/Td (2 - Td or Tdap) 01/01/2026   Pneumococcal Vaccine: 50+ Years  Completed   Influenza Vaccine  Completed   DEXA SCAN  Completed   Hepatitis C Screening  Completed   Zoster Vaccines- Shingrix  Completed   Meningococcal B Vaccine  Aged Out   Colonoscopy  Discontinued    Patient Care Team    Relationship Specialty Notifications Start End  Mahlon Comer BRAVO, MD PCP - General Family Medicine  01/17/16   Keenan Hastings, MD Consulting Physician Radiation Oncology  05/02/13   Odean Potts, MD Consulting Physician Hematology and Oncology  01/17/16   Ethyl Lenis, MD Consulting Physician General Surgery  01/27/18   Pandora Cadet, [facility] Pharmacist Pharmacist  12/20/19    Comment: phone number [phone]      Review of Systems Patient reports no vision/ hearing changes, adenopathy,fever, weight change,  persistant/recurrent hoarseness , swallowing issues, chest pain, palpitations, edema, persistant/recurrent cough, hemoptysis, dyspnea (rest/exertional/paroxysmal nocturnal), gastrointestinal bleeding (melena, rectal bleeding), abdominal pain, significant heartburn, bowel changes, GU symptoms (dysuria, hematuria, incontinence), Gyn symptoms (abnormal  bleeding, pain),  syncope, focal weakness, memory loss, skin/hair/nail changes, abnormal bruising or bleeding, anxiety, or depression.   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    Objective:   Physical Exam General Appearance:    Alert, cooperative, no distress, appears stated age  Head:    Normocephalic, without obvious abnormality, atraumatic  Eyes:    PERRL,  conjunctiva/corneas clear, EOM's intact both eyes  Ears:    Normal TM's and external ear canals, both ears  Nose:   Nares normal, septum midline, mucosa normal, no drainage    or sinus tenderness  Throat:   Lips, mucosa, and tongue normal; teeth and gums normal  Neck:   Supple, symmetrical, trachea midline, no adenopathy;    Thyroid : no enlargement/tenderness/nodules  Back:     Symmetric, no curvature, ROM normal, no CVA tenderness  Lungs:     Clear to auscultation bilaterally, respirations unlabored  Chest Wall:    No tenderness or deformity   Heart:    Regular rate and rhythm, S1 and S2 normal, no murmur, rub   or gallop  Breast Exam:    Deferred to GYN  Abdomen:     Soft, non-tender, bowel sounds active all four quadrants,    no masses, no organomegaly  Genitalia:    Deferred to GYN  Rectal:    Extremities:   Extremities normal, atraumatic, no cyanosis or edema  Pulses:   2+ and symmetric all extremities  Skin:   Skin texture, turgor normal, no rashes or lesions.  + vitiligo  Lymph nodes:   Cervical, supraclavicular, and axillary nodes normal  Neurologic:   CNII-XII intact, normal strength, sensation and reflexes    throughout          Assessment & Plan:

## 2024-07-08 NOTE — Patient Instructions (Signed)
 Follow up in 6 months to recheck blood pressure and cholesterol We'll notify you of your lab results and make any changes if needed START the Fosamax weekly Keep up the good work on healthy diet and regular exercise- you look great!!! Call with any questions or concerns Stay safe!  Stay healthy! Happy Early Dortha!!!

## 2024-07-11 ENCOUNTER — Ambulatory Visit: Payer: Self-pay | Admitting: Family Medicine

## 2024-09-11 ENCOUNTER — Other Ambulatory Visit: Payer: Self-pay | Admitting: Family Medicine

## 2024-09-11 DIAGNOSIS — I10 Essential (primary) hypertension: Secondary | ICD-10-CM

## 2024-09-27 ENCOUNTER — Other Ambulatory Visit: Payer: Self-pay | Admitting: Family Medicine

## 2024-09-27 DIAGNOSIS — Z1231 Encounter for screening mammogram for malignant neoplasm of breast: Secondary | ICD-10-CM

## 2024-09-28 ENCOUNTER — Telehealth: Payer: Self-pay

## 2024-09-28 NOTE — Telephone Encounter (Signed)
 Called patients daughter (okay per DPR) and told her she is not due for another 6 months. Daughter verbalized understanding. She will have Dr. Mahlon order bone density at April visit

## 2024-09-28 NOTE — Telephone Encounter (Signed)
 I am not seeing a new order for a bone density scan. I may be missing it. Does she need a new order?

## 2024-09-28 NOTE — Telephone Encounter (Signed)
 She had her last bone density done in June 2024 and these are every 2 yrs.  So she is not due for another 6 months

## 2024-09-28 NOTE — Telephone Encounter (Signed)
 Copied from CRM #[id number]. Topic: Clinical - Lab/Test Results >> Sep 27, 2024  4:46 PM Alfonso ORN wrote: Reason for CRM: pt daughter called in to f/u on scheduling for bone density . If required , she was told [facility] no longer does this and wanted to go to another [place] facility. Please advise   [phone]

## 2024-11-28 ENCOUNTER — Ambulatory Visit

## 2025-01-06 ENCOUNTER — Ambulatory Visit: Admitting: Family Medicine
